# Patient Record
Sex: Female | Born: 1960 | Race: White | Hispanic: No | Marital: Married | Smoking: Never smoker
Health system: Southern US, Community
[De-identification: ages and names within clinical notes are randomized; demographics above are authoritative.]

## PROBLEM LIST (undated history)

## (undated) DIAGNOSIS — R112 Nausea with vomiting, unspecified: Secondary | ICD-10-CM

## (undated) DIAGNOSIS — I82409 Acute embolism and thrombosis of unspecified deep veins of unspecified lower extremity: Secondary | ICD-10-CM

## (undated) DIAGNOSIS — Z975 Presence of (intrauterine) contraceptive device: Secondary | ICD-10-CM

## (undated) DIAGNOSIS — I10 Essential (primary) hypertension: Secondary | ICD-10-CM

## (undated) DIAGNOSIS — N390 Urinary tract infection, site not specified: Secondary | ICD-10-CM

## (undated) DIAGNOSIS — F419 Anxiety disorder, unspecified: Secondary | ICD-10-CM

## (undated) DIAGNOSIS — Z923 Personal history of irradiation: Secondary | ICD-10-CM

## (undated) DIAGNOSIS — N393 Stress incontinence (female) (male): Secondary | ICD-10-CM

## (undated) DIAGNOSIS — Z9889 Other specified postprocedural states: Secondary | ICD-10-CM

## (undated) DIAGNOSIS — C189 Malignant neoplasm of colon, unspecified: Secondary | ICD-10-CM

## (undated) DIAGNOSIS — A419 Sepsis, unspecified organism: Secondary | ICD-10-CM

## (undated) DIAGNOSIS — T148XXA Other injury of unspecified body region, initial encounter: Secondary | ICD-10-CM

## (undated) DIAGNOSIS — B999 Unspecified infectious disease: Secondary | ICD-10-CM

## (undated) DIAGNOSIS — M1990 Unspecified osteoarthritis, unspecified site: Secondary | ICD-10-CM

## (undated) HISTORY — PX: BACK SURGERY: SHX140

## (undated) HISTORY — PX: OTHER SURGICAL HISTORY: SHX169

## (undated) HISTORY — PX: APPENDECTOMY: SHX54

## (undated) HISTORY — PX: REFRACTIVE SURGERY: SHX103

## (undated) HISTORY — DX: Unspecified osteoarthritis, unspecified site: M19.90

## (undated) HISTORY — PX: BREAST SURGERY: SHX581

---

## 1999-03-30 ENCOUNTER — Other Ambulatory Visit: Admission: RE | Admit: 1999-03-30 | Discharge: 1999-03-30 | Payer: Self-pay | Admitting: Obstetrics & Gynecology

## 2001-05-02 ENCOUNTER — Other Ambulatory Visit: Admission: RE | Admit: 2001-05-02 | Discharge: 2001-05-02 | Payer: Self-pay | Admitting: Obstetrics & Gynecology

## 2002-12-31 ENCOUNTER — Other Ambulatory Visit: Admission: RE | Admit: 2002-12-31 | Discharge: 2002-12-31 | Payer: Self-pay | Admitting: Obstetrics & Gynecology

## 2004-06-20 HISTORY — PX: INTRAUTERINE DEVICE INSERTION: SHX323

## 2004-07-08 ENCOUNTER — Other Ambulatory Visit: Admission: RE | Admit: 2004-07-08 | Discharge: 2004-07-08 | Payer: Self-pay | Admitting: Obstetrics & Gynecology

## 2005-08-01 ENCOUNTER — Other Ambulatory Visit: Admission: RE | Admit: 2005-08-01 | Discharge: 2005-08-01 | Payer: Self-pay | Admitting: Obstetrics and Gynecology

## 2008-06-11 ENCOUNTER — Observation Stay (HOSPITAL_COMMUNITY): Admission: RE | Admit: 2008-06-11 | Discharge: 2008-06-13 | Payer: Self-pay | Admitting: Orthopedic Surgery

## 2008-10-29 ENCOUNTER — Ambulatory Visit (HOSPITAL_BASED_OUTPATIENT_CLINIC_OR_DEPARTMENT_OTHER): Admission: RE | Admit: 2008-10-29 | Discharge: 2008-10-29 | Payer: Self-pay | Admitting: Orthopedic Surgery

## 2010-11-02 NOTE — Op Note (Signed)
[name], [name]             ACCOUNT NO.:  [id number]   MEDICAL RECORD NO.:  [id number]          PATIENT TYPE:  AMB   LOCATION:  DAY                          FACILITY:  [facility]   PHYSICIAN:  Georges Lynch. Gioffre, M.D.DATE OF BIRTH:  [birth year]   DATE OF PROCEDURE:  06/11/2008  DATE OF DISCHARGE:                               OPERATIVE REPORT   SURGEON:  Georges Lynch. Darrelyn Hillock, M.D.   ASSISTANT:  Dr. Marlowe Kays.   PREOPERATIVE DIAGNOSES:  1. 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  2. Severe lateral recess stenosis at L5-S1 on the left.  3. Herniated lumbar disk L4-5 on the left with a partial foot drop on      the left.  4. Herniated disk L5-S1 on the left.   POSTOPERATIVE DIAGNOSES:  1. 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  2. Severe lateral recess stenosis at L5-S1 on the left.  3. Herniated lumbar disk L4-5 on the left with a partial foot drop on      the left.  4. Herniated disk L5-S1 on the left.   OPERATION:  1. Complete decompressive lumbar laminectomy of the lateral recess at      L4-5 on the left.  2. Complete decompressive lumbar laminectomy at the L5-S1 foraminal      recess on the left with foraminotomies.  Also, I did a complete      laminectomy of L5.  3. Microdiskectomy at L4-5 left.  4. Microdiskectomy at L5-S1 on the left.   DESCRIPTION OF PROCEDURE:  Under general anesthesia with the patient on  the spinal frame, she first had 1 gram of IV Ancef.  A routine  orthopedic prep and drape of the back was carried out.  Two needles were  placed in the back for localization purposes.  Following that, an x-ray  was taken.  An incision then was made over the L4-5 and L5 interspace.  Bleeders were identified and cauterized.  I stripped the muscle from  both sides in order to apply the [facility] retractors.  I then went  down and separated the muscle from the lamina of L4-5 and L5-S1 on the  left.  Another x-ray was taken to verify the  position.  I noted at this  time that she had severe overgrowth of her for facets at L4-5 and L5-S1  and she had a very wide lamina on that side.  Note, she had a  significant recessed stenosis at L4-5 and L5-S1.  She had complete  collapse just about of the L5-S1 disk space secondary to degenerative  arthritis.  The microscope was brought in and we carried out a complete  laminectomy of the lamina of L5.  We then went distally and removed the  ligamentum flavum in the usual fashion and did a foraminotomy and  identified the S1 root, freed that up, identified the disk, and did a  microdiskectomy.  She had a rather small herniated disk at L5-S1 on the  left.  The nerve root now was free.  We then advanced proximally and up  along the lateral  recess, completely decompressed the lateral recess up  to the lamina above, did a partial laminectomy there as well.  At L4 he  had marked distention of the lateral recess veins.  We cauterized those  with the bipolar, with great care taken not to injure the underlying  neurological structures.  We identified the 5 root above, gently  retracted that, put a needle in the disk space and an x-ray was taken to  verify we were now at the L4-5 interspace.  We then made a cruciate  incision in that space and did a complete microdiskectomy.  Multiple  passes were made subligamentous as well and then we utilized the Epstein  curettes as well as the nerve hooks.  We decompressed the area out  laterally as well.  We decompressed the foramina as well.  The nerve  root of L5 now was completely free and the recess was free.  We  thoroughly irrigated out the area and then I injected 10 mL of FloSeal  and no cottonoids were used.  I then closed the deep part of the wound  in the usual fashion.  We left a small deep distal part of the wound  open for drainage purposes.  The subcu was closed with 0 Vicryl.  The  skin was closed with metal staples.  The patient left the  operating room  in satisfactory condition.           ______________________________  Georges Lynch Darrelyn Hillock, M.D.     RAG/MEDQ  D:  06/11/2008  T:  06/12/2008  Job:  [id number]   cc:   Eileen Stanford  [facility]

## 2010-11-02 NOTE — Op Note (Signed)
[name], [name]             ACCOUNT NO.:  [id number]   MEDICAL RECORD NO.:  [id number]          PATIENT TYPE:  AMB   LOCATION:  [facility]                         FACILITY:  [facility]   PHYSICIAN:  Ollen Gross, M.D.    DATE OF BIRTH:  23-Jan-1961   DATE OF PROCEDURE:  10/29/2008  DATE OF DISCHARGE:                               OPERATIVE REPORT   PREOPERATIVE DIAGNOSIS:  Left knee medial meniscal tear with lateral  patellar tilt.   POSTOPERATIVE DIAGNOSIS:  Left knee medial meniscal tear with lateral  patellar tilt plus chondral defect.   PROCEDURE:  Left knee arthroscopy with meniscal debridement,  chondroplasty medial and lateral retinacular release.   SURGEON:  Ollen Gross, M.D.   ASSISTANT:  None.   ANESTHESIA:  General.   ESTIMATED BLOOD LOSS:  Minimal.   DRAINS:  Hemovac x1.   COMPLICATIONS:  None.   CONDITION:  Stable to the recovery room.   BRIEF CLINICAL NOTE:  [name] is a 50 year old female who has had  significant left knee pain and mechanical symptoms for several months  now.  She has a lateral tilt to the patella and symptomatic  chondromalacia of the patella.  She has had extensive nonoperative  management without benefit.  She also has a medial meniscal tear on MRI.  She presents now for arthroscopy and debridement and lateral release.   PROCEDURE IN DETAIL:  After successful administration of general  anesthetic, a tourniquet is placed high on the left thigh, and the left  lower extremity is prepped and draped in the usual sterile fashion.  The  standard superior medial and inferior lateral incisions were made.  An  inflow cannula was passed superior medial, and a camera passed  inferolateral.  Arthroscopic visualization proceeds.  The undersurface  of the patella looks normal except for the lateral facet, which has  grade 2 chondromalacia and frayed cartilage.  There are no full-  thickness defects.  The trochlea looks normal.  Medial and lateral  gutters are visualized.  There are no loose bodies.  The patella is  tilting quite a bit laterally.  The medial compartment is entered, and  there is evidence of a chondral defect on the medial femoral condyle  near the intercondylar notch.  There is about a 1 x 2 cm area, and the  cartilage is delaminating from below.  The spinal needle was used to  localize the inferior medial portal.  A small incision was made, and  dilators placed.  A 4.2 shaver is then placed and used to probe the area  first and showed unstable articular cartilage.  It was then debrided  back to a stable base with the shaver.  The total size is about 1 x 2  cm.  There was very thin cartilage, but it was stabilized, and there is  no exposed bone.  I then debrided the meniscus back to a stable base  with baskets and a 4.2 mm shaver.  The meniscus was much more stable  post debridement.  The intercondylar notch is visualized.  The ACL is  looking fine.  The lateral compartment  is entered and looks normal with  the exception of a vertical fissure in the cartilage of the tibial side  laterally.  This is probed and is not unstable.  We then addressed the  patellofemoral joint, and I used a shaver to debride the unstable  cartilage on the undersurface of the patella back to a stable  cartilaginous base.  We then switched portals, placing the camera  inferomedial and working portal inferolateral.  We marked the junction  of the superior and lateral border of the patella and then began the  lateral release with arthroscopic electrocautery, starting above this  point and coursing all the way down to the lateral portal.  I then  decreased the inflow pressure and coagulated any points of bleeding.  The arthroscopic equipment is then removed from the inferior portals,  which are closed with interrupted 4-0 nylon.  Marcaine 20 cc of 0.25%  with epi injected through the inflow cannula.  We let that stay for a  couple of minutes and  then placed the Hemovac drain through the inflow  cannula, subsequently removing the cannula. The incision is closed with  the drains not sewn in.  It is hooked up to the Hemovac and charged.  A  bulky sterile dressing is then applied, and she is awakened and  transported to the recovery room in stable condition.      Ollen Gross, M.D.  Electronically Signed     FA/MEDQ  D:  10/29/2008  T:  10/29/2008  Job:  [id number]

## 2011-03-25 LAB — DIFFERENTIAL
Basophils Relative: 1 % (ref 0–1)
Lymphocytes Relative: 26 % (ref 12–46)
Lymphs Abs: 1.1 10*3/uL (ref 0.7–4.0)
Monocytes Relative: 8 % (ref 3–12)
Neutro Abs: 2.7 10*3/uL (ref 1.7–7.7)
Neutrophils Relative %: 64 % (ref 43–77)

## 2011-03-25 LAB — COMPREHENSIVE METABOLIC PANEL
Albumin: 3.9 g/dL (ref 3.5–5.2)
BUN: 9 mg/dL (ref 6–23)
Calcium: 9.2 mg/dL (ref 8.4–10.5)
Creatinine, Ser: 0.77 mg/dL (ref 0.4–1.2)
Glucose, Bld: 93 mg/dL (ref 70–99)
Total Protein: 6.5 g/dL (ref 6.0–8.3)

## 2011-03-25 LAB — CBC
HCT: 38.2 % (ref 36.0–46.0)
HCT: 40.8 % (ref 36.0–46.0)
Hemoglobin: 12.6 g/dL (ref 12.0–15.0)
Hemoglobin: 13 g/dL (ref 12.0–15.0)
Hemoglobin: 14.1 g/dL (ref 12.0–15.0)
MCHC: 34 g/dL (ref 30.0–36.0)
MCHC: 34 g/dL (ref 30.0–36.0)
MCHC: 34.5 g/dL (ref 30.0–36.0)
Platelets: 211 10*3/uL (ref 150–400)
Platelets: 219 10*3/uL (ref 150–400)
Platelets: 221 10*3/uL (ref 150–400)
RDW: 11.7 % (ref 11.5–15.5)
RDW: 11.8 % (ref 11.5–15.5)
RDW: 12.2 % (ref 11.5–15.5)

## 2011-03-25 LAB — PROTIME-INR: INR: 1 (ref 0.00–1.49)

## 2011-03-25 LAB — URINE CULTURE: Special Requests: NEGATIVE

## 2011-03-25 LAB — URINALYSIS, ROUTINE W REFLEX MICROSCOPIC
Glucose, UA: NEGATIVE mg/dL
Hgb urine dipstick: NEGATIVE
Ketones, ur: NEGATIVE mg/dL
Protein, ur: NEGATIVE mg/dL
Specific Gravity, Urine: 1.006 (ref 1.005–1.030)
Urobilinogen, UA: 0.2 mg/dL (ref 0.0–1.0)
pH: 6 (ref 5.0–8.0)

## 2011-03-25 LAB — PREGNANCY, URINE: Preg Test, Ur: NEGATIVE

## 2011-03-25 LAB — APTT: aPTT: 29 seconds (ref 24–37)

## 2011-12-29 ENCOUNTER — Other Ambulatory Visit: Payer: Self-pay | Admitting: Neurosurgery

## 2012-01-02 ENCOUNTER — Encounter (HOSPITAL_COMMUNITY): Payer: Self-pay | Admitting: Pharmacy Technician

## 2012-01-02 ENCOUNTER — Encounter (HOSPITAL_COMMUNITY)
Admission: RE | Admit: 2012-01-02 | Discharge: 2012-01-02 | Disposition: A | Discharge status: Home / Self Care | Payer: 59 | Source: Ambulatory Visit | Attending: Neurosurgery | Admitting: Neurosurgery

## 2012-01-02 ENCOUNTER — Encounter (HOSPITAL_COMMUNITY): Payer: Self-pay

## 2012-01-02 LAB — SURGICAL PCR SCREEN
MRSA, PCR: NEGATIVE
Staphylococcus aureus: NEGATIVE

## 2012-01-02 LAB — CBC
HCT: 39.7 % (ref 36.0–46.0)
Hemoglobin: 13.6 g/dL (ref 12.0–15.0)
MCH: 31.1 pg (ref 26.0–34.0)
MCHC: 34.3 g/dL (ref 30.0–36.0)
MCV: 90.8 fL (ref 78.0–100.0)
Platelets: 218 10*3/uL (ref 150–400)
RBC: 4.37 MIL/uL (ref 3.87–5.11)
RDW: 11.7 % (ref 11.5–15.5)
WBC: 5.3 10*3/uL (ref 4.0–10.5)

## 2012-01-02 LAB — TYPE AND SCREEN
ABO/RH(D): O POS
Antibody Screen: NEGATIVE

## 2012-01-02 LAB — DIFFERENTIAL
Basophils Absolute: 0 10*3/uL (ref 0.0–0.1)
Basophils Relative: 1 % (ref 0–1)
Eosinophils Absolute: 0.1 10*3/uL (ref 0.0–0.7)
Eosinophils Relative: 1 % (ref 0–5)
Lymphocytes Relative: 22 % (ref 12–46)
Lymphs Abs: 1.2 10*3/uL (ref 0.7–4.0)
Monocytes Absolute: 0.4 10*3/uL (ref 0.1–1.0)
Monocytes Relative: 8 % (ref 3–12)
Neutro Abs: 3.7 10*3/uL (ref 1.7–7.7)
Neutrophils Relative %: 69 % (ref 43–77)

## 2012-01-02 MED ORDER — DEXAMETHASONE SODIUM PHOSPHATE 10 MG/ML IJ SOLN
10.0000 mg | INTRAMUSCULAR | Status: DC
  Filled 2012-01-02: qty 1

## 2012-01-02 MED ORDER — CEFAZOLIN SODIUM-DEXTROSE 2-3 GM-% IV SOLR
2.0000 g | INTRAVENOUS | Status: AC
  Administered 2012-01-03: 2 g via INTRAVENOUS
  Filled 2012-01-02: qty 50

## 2012-01-02 NOTE — Pre-Procedure Instructions (Signed)
20 [name]  01/02/2012   Your procedure is scheduled on:  01/03/12  Report to [facility] at 1047 AM.  Call this number if you have problems the morning of surgery: [phone]   Remember:   Do not eat food:After Midnight.  May have clear liquids:until Midnight .  Clear liquids include soda, tea, black coffee, apple or grape juice, broth.  Take these medicines the morning of surgery with A SIP OF WATER: vivella dot   Do not wear jewelry, make-up or nail polish.  Do not wear lotions, powders, or perfumes. You may wear deodorant.  Do not shave 48 hours prior to surgery. Men may shave face and neck.  Do not bring valuables to the hospital.  Contacts, dentures or bridgework may not be worn into surgery.  Leave suitcase in the car. After surgery it may be brought to your room.  For patients admitted to the hospital, checkout time is 11:00 AM the day of discharge.   Patients discharged the day of surgery will not be allowed to drive home.  Name and phone number of your driver: family  Special Instructions: CHG Shower Use Special Wash: 1/2 bottle night before surgery and 1/2 bottle morning of surgery.   Please read over the following fact sheets that you were given: Pain Booklet, Coughing and Deep Breathing, Blood Transfusion Information, MRSA Information and Surgical Site Infection Prevention

## 2012-01-03 ENCOUNTER — Encounter (HOSPITAL_COMMUNITY): Payer: Self-pay | Admitting: Anesthesiology

## 2012-01-03 ENCOUNTER — Inpatient Hospital Stay (HOSPITAL_COMMUNITY)
Admission: RE | Admit: 2012-01-03 | Discharge: 2012-01-03 | DRG: 473 | Disposition: A | Discharge status: Home / Self Care | Payer: 59 | Source: Ambulatory Visit | Attending: Neurosurgery | Admitting: Neurosurgery

## 2012-01-03 ENCOUNTER — Ambulatory Visit (HOSPITAL_COMMUNITY): Payer: 59 | Admitting: Anesthesiology

## 2012-01-03 ENCOUNTER — Encounter (HOSPITAL_COMMUNITY)
Admission: RE | Disposition: A | Discharge status: Home / Self Care | Payer: Self-pay | Source: Ambulatory Visit | Attending: Neurosurgery

## 2012-01-03 ENCOUNTER — Inpatient Hospital Stay (HOSPITAL_COMMUNITY): Payer: 59

## 2012-01-03 ENCOUNTER — Encounter (HOSPITAL_COMMUNITY): Payer: Self-pay | Admitting: *Deleted

## 2012-01-03 DIAGNOSIS — Z01812 Encounter for preprocedural laboratory examination: Secondary | ICD-10-CM

## 2012-01-03 DIAGNOSIS — M5020 Other cervical disc displacement, unspecified cervical region: Principal | ICD-10-CM | POA: Diagnosis present

## 2012-01-03 DIAGNOSIS — M5010 Cervical disc disorder with radiculopathy, unspecified cervical region: Secondary | ICD-10-CM | POA: Diagnosis present

## 2012-01-03 DIAGNOSIS — Z96659 Presence of unspecified artificial knee joint: Secondary | ICD-10-CM

## 2012-01-03 HISTORY — PX: CERVICAL DISC ARTHROPLASTY: SHX587

## 2012-01-03 SURGERY — CERVICAL ANTERIOR DISC ARTHROPLASTY
Anesthesia: General | Site: Neck | Wound class: Clean

## 2012-01-03 MED ORDER — CYCLOBENZAPRINE HCL 10 MG PO TABS: 10.0000 mg | ORAL_TABLET | Freq: Three times a day (TID) | ORAL | Status: AC | PRN

## 2012-01-03 MED ORDER — CYCLOBENZAPRINE HCL 10 MG PO TABS
10.0000 mg | ORAL_TABLET | Freq: Three times a day (TID) | ORAL | Status: DC | PRN
  Administered 2012-01-03: 10 mg via ORAL

## 2012-01-03 MED ORDER — GLYCOPYRROLATE 0.2 MG/ML IJ SOLN
INTRAMUSCULAR | Status: DC | PRN
  Administered 2012-01-03: .6 mg via INTRAVENOUS

## 2012-01-03 MED ORDER — MIDAZOLAM HCL 5 MG/5ML IJ SOLN
INTRAMUSCULAR | Status: DC | PRN
  Administered 2012-01-03: 2 mg via INTRAVENOUS

## 2012-01-03 MED ORDER — SODIUM CHLORIDE 0.9 % IV SOLN: 250.0000 mL | INTRAVENOUS | Status: DC

## 2012-01-03 MED ORDER — PROPOFOL 10 MG/ML IV EMUL
INTRAVENOUS | Status: DC | PRN
  Administered 2012-01-03: 120 mg via INTRAVENOUS

## 2012-01-03 MED ORDER — HYDROMORPHONE HCL PF 1 MG/ML IJ SOLN
INTRAMUSCULAR | Status: AC
  Filled 2012-01-03: qty 1

## 2012-01-03 MED ORDER — SODIUM CHLORIDE 0.9 % IR SOLN
Status: DC | PRN
  Administered 2012-01-03: 13:00:00

## 2012-01-03 MED ORDER — ALUM & MAG HYDROXIDE-SIMETH 200-200-20 MG/5ML PO SUSP: 30.0000 mL | Freq: Four times a day (QID) | ORAL | Status: DC | PRN

## 2012-01-03 MED ORDER — HYDROMORPHONE HCL PF 1 MG/ML IJ SOLN
0.2500 mg | INTRAMUSCULAR | Status: DC | PRN
  Administered 2012-01-03 (×4): 0.5 mg via INTRAVENOUS

## 2012-01-03 MED ORDER — ACETAMINOPHEN 10 MG/ML IV SOLN: 1000.0000 mg | Freq: Once | INTRAVENOUS | Status: DC | PRN

## 2012-01-03 MED ORDER — LIDOCAINE HCL (CARDIAC) 20 MG/ML IV SOLN
INTRAVENOUS | Status: DC | PRN
  Administered 2012-01-03: 50 mg via INTRAVENOUS

## 2012-01-03 MED ORDER — THROMBIN 5000 UNITS EX SOLR
CUTANEOUS | Status: DC | PRN
  Administered 2012-01-03 (×2): 5000 [IU] via TOPICAL

## 2012-01-03 MED ORDER — HYDROMORPHONE HCL PF 1 MG/ML IJ SOLN: 0.5000 mg | INTRAMUSCULAR | Status: DC | PRN

## 2012-01-03 MED ORDER — LACTATED RINGERS IV SOLN
INTRAVENOUS | Status: DC | PRN
  Administered 2012-01-03 (×2): via INTRAVENOUS

## 2012-01-03 MED ORDER — 0.9 % SODIUM CHLORIDE (POUR BTL) OPTIME
TOPICAL | Status: DC | PRN
  Administered 2012-01-03: 1000 mL

## 2012-01-03 MED ORDER — SODIUM CHLORIDE 0.9 % IJ SOLN: 3.0000 mL | Freq: Two times a day (BID) | INTRAMUSCULAR | Status: DC

## 2012-01-03 MED ORDER — SODIUM CHLORIDE 0.9 % IV SOLN
INTRAVENOUS | Status: AC
  Filled 2012-01-03: qty 500

## 2012-01-03 MED ORDER — ACETAMINOPHEN 650 MG RE SUPP: 650.0000 mg | RECTAL | Status: DC | PRN

## 2012-01-03 MED ORDER — NEOSTIGMINE METHYLSULFATE 1 MG/ML IJ SOLN
INTRAMUSCULAR | Status: DC | PRN
  Administered 2012-01-03: 4 mg via INTRAVENOUS

## 2012-01-03 MED ORDER — HYDROCODONE-ACETAMINOPHEN 5-325 MG PO TABS: 1.0000 | ORAL_TABLET | ORAL | Status: DC | PRN

## 2012-01-03 MED ORDER — PHENOL 1.4 % MT LIQD: 1.0000 | OROMUCOSAL | Status: DC | PRN

## 2012-01-03 MED ORDER — HEMOSTATIC AGENTS (NO CHARGE) OPTIME
TOPICAL | Status: DC | PRN
  Administered 2012-01-03: 1 via TOPICAL

## 2012-01-03 MED ORDER — SODIUM CHLORIDE 0.9 % IJ SOLN: 3.0000 mL | INTRAMUSCULAR | Status: DC | PRN

## 2012-01-03 MED ORDER — CEFAZOLIN SODIUM 1-5 GM-% IV SOLN
1.0000 g | Freq: Three times a day (TID) | INTRAVENOUS | Status: DC
  Administered 2012-01-03: 1 g via INTRAVENOUS
  Filled 2012-01-03 (×2): qty 50

## 2012-01-03 MED ORDER — ZOLPIDEM TARTRATE 5 MG PO TABS: 5.0000 mg | ORAL_TABLET | Freq: Every evening | ORAL | Status: DC | PRN

## 2012-01-03 MED ORDER — DEXAMETHASONE SODIUM PHOSPHATE 10 MG/ML IJ SOLN
INTRAMUSCULAR | Status: DC | PRN
  Administered 2012-01-03: 10 mg via INTRAVENOUS

## 2012-01-03 MED ORDER — ONDANSETRON HCL 4 MG/2ML IJ SOLN: 4.0000 mg | INTRAMUSCULAR | Status: DC | PRN

## 2012-01-03 MED ORDER — ACETAMINOPHEN 325 MG PO TABS
650.0000 mg | ORAL_TABLET | ORAL | Status: DC | PRN
Start: 2012-01-03 — End: 2012-01-04

## 2012-01-03 MED ORDER — OXYCODONE-ACETAMINOPHEN 5-325 MG PO TABS
1.0000 | ORAL_TABLET | ORAL | Status: DC | PRN
  Administered 2012-01-03: 1 via ORAL
  Filled 2012-01-03: qty 1

## 2012-01-03 MED ORDER — HYDROCODONE-ACETAMINOPHEN 10-325 MG PO TABS: 1.0000 | ORAL_TABLET | ORAL | Status: AC | PRN

## 2012-01-03 MED ORDER — ONDANSETRON HCL 4 MG/2ML IJ SOLN
INTRAMUSCULAR | Status: DC | PRN
  Administered 2012-01-03: 4 mg via INTRAVENOUS

## 2012-01-03 MED ORDER — FENTANYL CITRATE 0.05 MG/ML IJ SOLN
INTRAMUSCULAR | Status: DC | PRN
  Administered 2012-01-03: 50 ug via INTRAVENOUS
  Administered 2012-01-03 (×2): 100 ug via INTRAVENOUS

## 2012-01-03 MED ORDER — ROCURONIUM BROMIDE 100 MG/10ML IV SOLN
INTRAVENOUS | Status: DC | PRN
  Administered 2012-01-03: 40 mg via INTRAVENOUS

## 2012-01-03 MED ORDER — BACITRACIN 50000 UNITS IM SOLR
INTRAMUSCULAR | Status: AC
  Filled 2012-01-03: qty 1

## 2012-01-03 MED ORDER — SENNA 8.6 MG PO TABS: 1.0000 | ORAL_TABLET | Freq: Two times a day (BID) | ORAL | Status: DC

## 2012-01-03 MED ORDER — ONDANSETRON HCL 4 MG/2ML IJ SOLN: 4.0000 mg | Freq: Once | INTRAMUSCULAR | Status: DC | PRN

## 2012-01-03 MED ORDER — MENTHOL 3 MG MT LOZG: 1.0000 | LOZENGE | OROMUCOSAL | Status: DC | PRN

## 2012-01-03 MED ORDER — CYCLOBENZAPRINE HCL 10 MG PO TABS
ORAL_TABLET | ORAL | Status: AC
  Filled 2012-01-03: qty 1

## 2012-01-03 MED ORDER — LIDOCAINE HCL 4 % MT SOLN
OROMUCOSAL | Status: DC | PRN
  Administered 2012-01-03: 4 mL via TOPICAL

## 2012-01-03 SURGICAL SUPPLY — 56 items
ADH SKN CLS APL DERMABOND .7 (GAUZE/BANDAGES/DRESSINGS) ×1
APL SKNCLS STERI-STRIP NONHPOA (GAUZE/BANDAGES/DRESSINGS) ×1
BAG DECANTER FOR FLEXI CONT (MISCELLANEOUS) ×2 IMPLANT
BENZOIN TINCTURE PRP APPL 2/3 (GAUZE/BANDAGES/DRESSINGS) ×2 IMPLANT
BRUSH SCRUB EZ PLAIN DRY (MISCELLANEOUS) ×2 IMPLANT
BUR MATCHSTICK NEURO 3.0 LAGG (BURR) ×2 IMPLANT
CANISTER SUCTION 2500CC (MISCELLANEOUS) ×2 IMPLANT
CLOTH BEACON ORANGE TIMEOUT ST (SAFETY) ×2 IMPLANT
CONT SPEC 4OZ CLIKSEAL STRL BL (MISCELLANEOUS) ×2 IMPLANT
CUTTER 15 MM ×1 IMPLANT
DERMABOND ADVANCED (GAUZE/BANDAGES/DRESSINGS) ×1
DERMABOND ADVANCED .7 DNX12 (GAUZE/BANDAGES/DRESSINGS) IMPLANT
DISC CERVICAL BRYAN 15MM (Neuro Prosthesis/Implant) ×1 IMPLANT
DRAPE C-ARM 42X72 X-RAY (DRAPES) ×4 IMPLANT
DRAPE LAPAROTOMY 100X72 PEDS (DRAPES) ×2 IMPLANT
DRAPE MICROSCOPE ZEISS OPMI (DRAPES) ×2 IMPLANT
DRAPE POUCH INSTRU U-SHP 10X18 (DRAPES) ×2 IMPLANT
ELECT COATED BLADE 2.86 ST (ELECTRODE) ×2 IMPLANT
ELECT REM PT RETURN 9FT ADLT (ELECTROSURGICAL) ×2
ELECTRODE REM PT RTRN 9FT ADLT (ELECTROSURGICAL) ×1 IMPLANT
GAUZE SPONGE 4X4 16PLY XRAY LF (GAUZE/BANDAGES/DRESSINGS) IMPLANT
GLOVE BIOGEL M 8.0 STRL (GLOVE) ×1 IMPLANT
GLOVE BIOGEL PI IND STRL 7.0 (GLOVE) IMPLANT
GLOVE BIOGEL PI INDICATOR 7.0 (GLOVE) ×3
GLOVE ECLIPSE 8.5 STRL (GLOVE) ×2 IMPLANT
GLOVE EXAM NITRILE LRG STRL (GLOVE) IMPLANT
GLOVE EXAM NITRILE MD LF STRL (GLOVE) ×1 IMPLANT
GLOVE EXAM NITRILE XL STR (GLOVE) IMPLANT
GLOVE EXAM NITRILE XS STR PU (GLOVE) IMPLANT
GLOVE OPTIFIT SS 6.5 STRL BRWN (GLOVE) ×2 IMPLANT
GLOVE SS BIOGEL STRL SZ 6.5 (GLOVE) IMPLANT
GLOVE SUPERSENSE BIOGEL SZ 6.5 (GLOVE) ×2
GOWN BRE IMP SLV AUR LG STRL (GOWN DISPOSABLE) ×1 IMPLANT
GOWN BRE IMP SLV AUR XL STRL (GOWN DISPOSABLE) ×2 IMPLANT
GOWN STRL REIN 2XL LVL4 (GOWN DISPOSABLE) IMPLANT
HEAD HALTER (SOFTGOODS) ×2 IMPLANT
KIT BASIN OR (CUSTOM PROCEDURE TRAY) ×2 IMPLANT
KIT ROOM TURNOVER OR (KITS) ×2 IMPLANT
NDL SPNL 20GX3.5 QUINCKE YW (NEEDLE) ×1 IMPLANT
NEEDLE SPNL 20GX3.5 QUINCKE YW (NEEDLE) ×2 IMPLANT
NS IRRIG 1000ML POUR BTL (IV SOLUTION) ×2 IMPLANT
PACK LAMINECTOMY NEURO (CUSTOM PROCEDURE TRAY) ×2 IMPLANT
PAD ARMBOARD 7.5X6 YLW CONV (MISCELLANEOUS) ×5 IMPLANT
RUBBERBAND STERILE (MISCELLANEOUS) ×4 IMPLANT
SPONGE GAUZE 4X4 12PLY (GAUZE/BANDAGES/DRESSINGS) ×2 IMPLANT
SPONGE INTESTINAL PEANUT (DISPOSABLE) ×2 IMPLANT
SPONGE SURGIFOAM ABS GEL SZ50 (HEMOSTASIS) ×2 IMPLANT
STRIP CLOSURE SKIN 1/2X4 (GAUZE/BANDAGES/DRESSINGS) ×2 IMPLANT
SUT PDS AB 5-0 P3 18 (SUTURE) ×2 IMPLANT
SUT VIC AB 3-0 SH 8-18 (SUTURE) ×2 IMPLANT
SYR 20ML ECCENTRIC (SYRINGE) ×2 IMPLANT
TAPE CLOTH 4X10 WHT NS (GAUZE/BANDAGES/DRESSINGS) ×1 IMPLANT
TAPE CLOTH SURG 4X10 WHT LF (GAUZE/BANDAGES/DRESSINGS) ×1 IMPLANT
TOWEL OR 17X24 6PK STRL BLUE (TOWEL DISPOSABLE) ×2 IMPLANT
TOWEL OR 17X26 10 PK STRL BLUE (TOWEL DISPOSABLE) ×2 IMPLANT
WATER STERILE IRR 1000ML POUR (IV SOLUTION) ×2 IMPLANT

## 2012-01-03 NOTE — Preoperative (Signed)
Beta Blockers   Reason not to administer Beta Blockers:Not Applicable 

## 2012-01-03 NOTE — Discharge Summary (Signed)
Physician Discharge Summary  Patient ID: [name] MRN: [id number] DOB/AGE: 49-Mar-1962 49 y.o.  Admit date: 01/03/2012 Discharge date: 01/03/2012  Admission Diagnoses:  Discharge Diagnoses:  Principal Problem:  *Herniation of cervical intervertebral disc with radiculopathy   Discharged Condition: good  Hospital Course: Patient admitted to the hospital where she underwent uncomplicated C5-6 anterior cervical discectomy and arthroplasty. Postoperative she is done quite well. Preoperative neck and upper extremity symptoms improved. Strength cessation intact. Swallowing and doing well. Ready for discharge home. Consults:   Significant Diagnostic Studies:   Treatments:   Discharge Exam: Blood pressure 130/69, pulse 71, temperature 97.3 F (36.3 C), temperature source Oral, resp. rate 19, SpO2 98.00%. Awake and alert oriented and appropriate. Cranial nerve function is intact. Motor sensory function extremities is normal. Wound clean dry and intact. Chest and abdomen benign.  Disposition:    Medication List  As of 01/03/2012  6:11 PM   TAKE these medications         cyclobenzaprine 10 MG tablet   Commonly known as: FLEXERIL   Take 1 tablet (10 mg total) by mouth 3 (three) times daily as needed for muscle spasms.      estradiol 0.05 MG/24HR   Commonly known as: VIVELLE-DOT   Place 1 patch onto the skin 2 (two) times a week. Sundays and Wednesday      HYDROcodone-acetaminophen 10-325 MG per tablet   Commonly known as: NORCO   Take 1-2 tablets by mouth every 4 (four) hours as needed for pain.           Follow-up Information    Follow up with Luz Mares A, MD. Call in 1 week. (Ask for Lurena Joiner)    Contact information:   [address], [address] [place] [address] [phone]          Signed: Temple Pacini 01/03/2012, 6:11 PM

## 2012-01-03 NOTE — H&P (Signed)
[name] is an 51 y.o. female.   Chief Complaint: Neck and right arm pain HPI: 51 year old female with neck and right upper trimming pain procedures and weakness consistent with a right-sided C6-3 copy failing conservative measure workup demonstrates evidence of a large right-sided C5-6 disc herniation with compression the spinal cord and exiting right-sided C6 nerve root. Patient been counseled as to her options. She is decided proceed with a C5-6 anterior cervical discectomy with C5-6 interbody arthroplasty. Past Medical History  Diagnosis Date  . No pertinent past medical history     Past Surgical History  Procedure Date  . Back surgery   . Cesarean section     x2  . Dand c   . Breat reduction   . Joint replacement     lt knee    History reviewed. No pertinent family history. Social History:  reports that she has never smoked. She does not have any smokeless tobacco history on file. She reports that she drinks alcohol. She reports that she does not use illicit drugs.  Allergies: No Known Allergies  Medications Prior to Admission  Medication Sig Dispense Refill  . estradiol (VIVELLE-DOT) 0.05 MG/24HR Place 1 patch onto the skin 2 (two) times a week. Sundays and Wednesday        Results for orders placed during the hospital encounter of 01/02/12 (from the past 48 hour(s))  SURGICAL PCR SCREEN     Status: Normal   Collection Time   01/02/12 12:24 PM      Component Value Range Comment   MRSA, PCR NEGATIVE  NEGATIVE    Staphylococcus aureus NEGATIVE  NEGATIVE   CBC     Status: Normal   Collection Time   01/02/12  1:21 PM      Component Value Range Comment   WBC 5.3  4.0 - 10.5 K/uL    RBC 4.37  3.87 - 5.11 MIL/uL    Hemoglobin 13.6  12.0 - 15.0 g/dL    HCT 16.1  09.6 - 04.5 %    MCV 90.8  78.0 - 100.0 fL    MCH 31.1  26.0 - 34.0 pg    MCHC 34.3  30.0 - 36.0 g/dL    RDW 40.9  81.1 - 91.4 %    Platelets 218  150 - 400 K/uL   DIFFERENTIAL     Status: Normal   Collection Time   01/02/12  1:21 PM      Component Value Range Comment   Neutrophils Relative 69  43 - 77 %    Neutro Abs 3.7  1.7 - 7.7 K/uL    Lymphocytes Relative 22  12 - 46 %    Lymphs Abs 1.2  0.7 - 4.0 K/uL    Monocytes Relative 8  3 - 12 %    Monocytes Absolute 0.4  0.1 - 1.0 K/uL    Eosinophils Relative 1  0 - 5 %    Eosinophils Absolute 0.1  0.0 - 0.7 K/uL    Basophils Relative 1  0 - 1 %    Basophils Absolute 0.0  0.0 - 0.1 K/uL   TYPE AND SCREEN     Status: Normal   Collection Time   01/02/12  1:25 PM      Component Value Range Comment   ABO/RH(D) O POS      Antibody Screen NEG      Sample Expiration 01/16/2012     ABO/RH     Status: Normal   Collection  Time   01/02/12  1:25 PM      Component Value Range Comment   ABO/RH(D) O POS      No results found.  Review of Systems  Constitutional: Negative.   HENT: Negative.   Eyes: Negative.   Respiratory: Negative.   Cardiovascular: Negative.   Gastrointestinal: Negative.   Genitourinary: Negative.   Musculoskeletal: Negative.   Skin: Negative.   Neurological: Negative.   Endo/Heme/Allergies: Negative.   Psychiatric/Behavioral: Negative.     Blood pressure 121/79, pulse 73, temperature 98 F (36.7 C), temperature source Oral, resp. rate 20, SpO2 95.00%. Physical Exam  Constitutional: She is oriented to person, place, and time. She appears well-developed and well-nourished.  HENT:  Head: Normocephalic and atraumatic.  Right Ear: External ear normal.  Left Ear: External ear normal.  Nose: Nose normal.  Mouth/Throat: Oropharynx is clear and moist.  Eyes: Conjunctivae and EOM are normal. Pupils are equal, round, and reactive to light. Right eye exhibits no discharge. Left eye exhibits no discharge.  Neck: Normal range of motion. Neck supple. No tracheal deviation present. No thyromegaly present.  Cardiovascular: Normal rate, regular rhythm, normal heart sounds and intact distal pulses.   No murmur  heard. Respiratory: Effort normal and breath sounds normal. No respiratory distress. She has no wheezes.  GI: Soft. Bowel sounds are normal. She exhibits no distension. There is no tenderness.  Musculoskeletal: Normal range of motion. She exhibits no edema and no tenderness.  Neurological: She is alert and oriented to person, place, and time. She has normal reflexes. She displays normal reflexes. No cranial nerve deficit. She exhibits normal muscle tone. Coordination normal.       Spurling's maneuver positive towards the right. Right wrist extensors graded out at 4/5. Decreased sensation right C6 dermatome. Remainder of her neurological exam intact.  Skin: Skin is warm and dry. No rash noted. No erythema. No pallor.  Psychiatric: She has a normal mood and affect. Her behavior is normal. Judgment and thought content normal.     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 Plan C5-6 anterior cervical discectomy with C5-6 anterior interbody arthroplasty. Risks and benefits been explained. Patient wishes to proceed.  Briley Bumgarner A 01/03/2012, 11:43 AM

## 2012-01-03 NOTE — Transfer of Care (Signed)
Immediate Anesthesia Transfer of Care Note  Patient: [name]  Procedure(s) Performed: Procedure(s) (LRB): CERVICAL ANTERIOR DISC ARTHROPLASTY (N/A)  Patient Location: PACU  Anesthesia Type: General  Level of Consciousness: awake, alert , oriented and patient cooperative  Airway & Oxygen Therapy: Patient Spontanous Breathing and Patient connected to nasal cannula oxygen  Post-op Assessment: Report given to PACU RN and Post -op Vital signs reviewed and stable  Post vital signs: Reviewed and stable  Complications: No apparent anesthesia complications

## 2012-01-03 NOTE — Brief Op Note (Signed)
01/03/2012  1:38 PM  PATIENT:  [name]  51 y.o. female  PRE-OPERATIVE DIAGNOSIS:  HNPA  POST-OPERATIVE DIAGNOSIS:  HNPA  PROCEDURE:  Procedure(s) (LRB): CERVICAL ANTERIOR DISC ARTHROPLASTY (N/A)  SURGEON:  Surgeon(s) and Role:    * Temple Pacini, MD - Primary  PHYSICIAN ASSISTANT:   ASSISTANTS:    ANESTHESIA:   general  EBL:  Total I/O In: 1000 [I.V.:1000] Out: 75 [Blood:75]  BLOOD ADMINISTERED:none  DRAINS: none   LOCAL MEDICATIONS USED:  NONE  SPECIMEN:  No Specimen  DISPOSITION OF SPECIMEN:  N/A  COUNTS:  YES  TOURNIQUET:  * No tourniquets in log *  DICTATION: .Dragon Dictation  PLAN OF CARE: Admit to inpatient   PATIENT DISPOSITION:  PACU - hemodynamically stable.   Delay start of Pharmacological VTE agent (>24hrs) due to surgical blood loss or risk of bleeding: yes

## 2012-01-03 NOTE — Op Note (Signed)
Date of procedure: 01/03/2012  Date of dictation: Same  Service: Neurosurgery  Preoperative diagnosis: Right C5-6 herniated pulposus with radiculopathy  Postoperative diagnosis: Same  Procedure Name: C5-6 anterior cervical discectomy with C5-6 anterior Bryan cervical interbody arthroplasty  Surgeon:Fotios Amos A.Durga Saldarriaga, M.D.  Asst. Surgeon: Jeral Fruit  Anesthesia: General  Indication: 51 year old female with neck and right upper any pain. Using weakest cyst with a right-sided C6 radiculopathy failing conservative measure workup demonstrates evidence of a large right-sided C5-6 disc herniation compression the thecal sac and right-sided C6 nerve root. Patient is now 4 C5-6 anterior cervical discectomy with interbody arthroplasty.  Operative note: After induction anesthesia, patient positioned supine with Extended and held in place with halter traction. Patient's anterior cervical region was prepped and draped sterilely. 10 blade used to make a linear skin incision overlying the C5-6 interspace. This is carried down sharply to the platysma. The platysma was then divided vertically and dissection proceeded along the medial border of the sternocleidomastoid muscle and carotid sheath. Trachea and esophagus were mobilized and retracted towards the left. Prevertebral fascia was stripped off the anterior spinal column. Longus: Longus: Muscles and elevated bilaterally using electric cautery. Deep self retaining retractor was placed intraoperative fluoroscopy is used and C5-6 level was confirmed. The space and incised 15 blade in a rectangular fashion. Y. disc space then achieved using pituitary rongeurs for tobacco progress Kerrison rongeurs high-speed drill per almost the disc were removed down to level posterior annulus. Charlies Silvers brought field these at the remainder of the discectomy remaining aspects of annulus and osteophytes removed using a high-speed drill down to the level posterior longitudinal  ligament. Posterior lateral and was elevated and resected piecemeal fashion using Kerrison rongeurs. Underlying thecal sac was identified. Y. center decompression and then performed by undercutting the bodies of C5 and C6. Decompression MCH are afraid are wide anterior foraminotomies were then performed on course exiting C6 nerve roots bilaterally including all elements of the disc herniation off to the right side. At this point a very thorough decompression had been achieved. There is no evidence of injury to thecal sac or nerve roots. Preparation was then made for interbody arthroplasty. Caspar pins were then placed into the C5 and C6 levels under fluoroscopic guidance. The space was distracted. The space was sized a 15 mm. The space was prepared prepared and cut to accept a 15 mm implant. The cutting drill was then inserted and troughs were drilled into the body of C5 and C6 for placement of the arthroplasty device. The endplate preparation was found to be adequate. Wound is irrigated out solution. A 15 mm Bryan disc implant was then impacted in place and fit snugly between the bodies of C5 and C6. Distractor pins were removed. Final images revealed good position of the implant the proper upper level with normal lamina spine. Wound is irrigated one final time and then closed in a typical fashion. Steri-Strips and sterile dressing were applied. There were no apparent complications. Patient tolerated the procedure well and she returns to the recovery room.

## 2012-01-03 NOTE — Anesthesia Postprocedure Evaluation (Signed)
  Anesthesia Post-op Note  Patient: [name]  Procedure(s) Performed: Procedure(s) (LRB): CERVICAL ANTERIOR DISC ARTHROPLASTY (N/A)  Patient Location: PACU  Anesthesia Type: General  Level of Consciousness: awake, alert  and oriented  Airway and Oxygen Therapy: Patient Spontanous Breathing  Post-op Pain: mild  Post-op Assessment: Post-op Vital signs reviewed and Patient's Cardiovascular Status Stable  Post-op Vital Signs: stable  Complications: No apparent anesthesia complications

## 2012-01-03 NOTE — Anesthesia Preprocedure Evaluation (Addendum)
Anesthesia Evaluation  Patient identified by MRN, date of birth, ID band Patient awake    Reviewed: Allergy & Precautions, H&P , NPO status , Patient's Chart, lab work & pertinent test results  History of Anesthesia Complications Negative for: history of anesthetic complications  Airway Mallampati: I TM Distance: >3 FB Neck ROM: Full    Dental  (+) Teeth Intact   Pulmonary neg pulmonary ROS,  breath sounds clear to auscultation        Cardiovascular negative cardio ROS  Rhythm:Regular Rate:Normal     Neuro/Psych  Neuromuscular disease negative psych ROS   GI/Hepatic negative GI ROS, Neg liver ROS,   Endo/Other  negative endocrine ROS  Renal/GU negative Renal ROS  negative genitourinary   Musculoskeletal negative musculoskeletal ROS (+)   Abdominal   Peds  Hematology negative hematology ROS (+)   Anesthesia Other Findings   Reproductive/Obstetrics                          Anesthesia Physical Anesthesia Plan  ASA: I  Anesthesia Plan: General   Post-op Pain Management:    Induction: Intravenous  Airway Management Planned: Oral ETT  Additional Equipment:   Intra-op Plan:   Post-operative Plan: Extubation in OR  Informed Consent: I have reviewed the patients History and Physical, chart, labs and discussed the procedure including the risks, benefits and alternatives for the proposed anesthesia with the patient or authorized representative who has indicated his/her understanding and acceptance.   Dental advisory given  Plan Discussed with:   Anesthesia Plan Comments: (HNP C5-6  Plan GA   Kipp Brood, MD)        Anesthesia Quick Evaluation

## 2012-01-03 NOTE — Progress Notes (Signed)
Pt discharge home with husband. Medication prescription and discharge instructions given using "teach-back" method. Opportunity given to ask questions with appropriate answers provided.incision without swelling, redness, drainage and/or redness. Drsg intact. Soft collar intact. Denied pain upon discharge. Escorted out of this unit by the nurse tech, in wheelchair, accompanied by husband.

## 2012-01-04 ENCOUNTER — Encounter (HOSPITAL_COMMUNITY): Payer: Self-pay | Admitting: Neurosurgery

## 2012-12-12 ENCOUNTER — Ambulatory Visit (INDEPENDENT_AMBULATORY_CARE_PROVIDER_SITE_OTHER): Payer: 59 | Admitting: Physician Assistant

## 2012-12-12 VITALS — BP 126/84 | HR 70 | Temp 98.0°F | Resp 16 | Ht 64.5 in | Wt 138.0 lb

## 2012-12-12 DIAGNOSIS — H9190 Unspecified hearing loss, unspecified ear: Secondary | ICD-10-CM

## 2012-12-12 DIAGNOSIS — T162XXA Foreign body in left ear, initial encounter: Secondary | ICD-10-CM

## 2012-12-12 DIAGNOSIS — H9193 Unspecified hearing loss, bilateral: Secondary | ICD-10-CM

## 2012-12-12 DIAGNOSIS — T169XXA Foreign body in ear, unspecified ear, initial encounter: Secondary | ICD-10-CM

## 2012-12-13 NOTE — Progress Notes (Signed)
   Patient ID: [name] MRN: [id number], DOB: 11/29/1960, 52 y.o. Date of Encounter: 12/13/2012, 7:36 AM  Primary Physician: Freddy Finner, MD  Chief Complaint: FB left IAC  HPI: 52 y.o. female with history below presents with FB in left IAC x 1 day. Patient states she was placing her hearing aid in and the rubber portion that goes into the canal came off. This has happened to her once previously. She has tried removed it with a lollipop stick, pickups, and flushing it out with water-all unsuccessfully today. It is causing her some discomfort. She did place a call into her audiologist's office, however they could not see her until 6/26. She is otherwise doing well.     Past Medical History  Diagnosis Date  . No pertinent past medical history      Home Meds: Prior to Admission medications   Medication Sig Start Date End Date Taking? Authorizing Provider  estradiol (VIVELLE-DOT) 0.05 MG/24HR Place 1 patch onto the skin 2 (two) times a week. Sundays and Wednesday   Yes Historical Provider, MD    Allergies: No Known Allergies  History   Social History  . Marital Status: Married    Spouse Name: N/A    Number of Children: N/A  . Years of Education: N/A   Occupational History  . Not on file.   Social History Main Topics  . Smoking status: Never Smoker   . Smokeless tobacco: Not on file  . Alcohol Use: Yes     Comment: wweekends  . Drug Use: No  . Sexually Active: Not on file   Other Topics Concern  . Not on file   Social History Narrative  . No narrative on file     Review of Systems: Constitutional: negative for chills, fever, or fatigue  HEENT: positive for hearing loss and otalgia. negative for vision changes, congestion, rhinorrhea, ST, or sinus pressure Respiratory: negative for wheezing, shortness of breath, or cough Dermatological: negative for rash Neurologic: negative for headache   Physical Exam: Blood pressure 126/84, pulse 70, temperature 98 F  (36.7 C), temperature source Oral, resp. rate 16, height 5' 4.5" (1.638 m), weight 138 lb (62.596 kg), SpO2 100.00%., Body mass index is 23.33 kg/(m^2). General: Well developed, well nourished, in no acute distress. Head: Normocephalic, atraumatic, eyes without discharge, sclera non-icteric, nares are without discharge. Right auditory canal clear, TM without perforation, pearly grey and translucent with reflective cone of light. Left auditory canal canal with black rubbery FB along auditory canal. See below.  Neck: Supple. Full ROM.  Lungs: Breathing is unlabored. Heart: Regular rate. Msk:  Strength and tone normal for age. Extremities/Skin: Warm and dry. No clubbing or cyanosis. No edema. No rashes or suspicious lesions. Neuro: Alert and oriented X 3. Moves all extremities spontaneously. Gait is normal. CNII-XII grossly in tact. Psych:  Responds to questions appropriately with a normal affect.     PROCEDURE NOTE: Verbal consent obtained.  Patient made an informed decision to proceed with the procedure. Tamara held otoscope while I used alligator forceps to remove FB in in 2 attempts.  FB removed intact.  TM inspected and found to be intact without perforation.  IAC without trauma.  Patient tolerated procedure well.    ASSESSMENT AND PLAN:  52 y.o. female with FB in left IAC status post removal per above -Removal per above -Follow up with hearing aid specialist  -RTC prn   Signed, Eula Listen, PA-C 12/13/2012 7:36 AM

## 2013-05-27 ENCOUNTER — Telehealth (INDEPENDENT_AMBULATORY_CARE_PROVIDER_SITE_OTHER): Payer: Self-pay

## 2013-05-27 ENCOUNTER — Other Ambulatory Visit: Payer: Self-pay | Admitting: Gastroenterology

## 2013-05-27 DIAGNOSIS — R140 Abdominal distension (gaseous): Secondary | ICD-10-CM

## 2013-05-27 NOTE — Telephone Encounter (Signed)
[name] with Dr Kinnie Scales will be ordering CT. To have results for  Appt 05-29-13 with Dr. Ezzard Standing

## 2013-05-27 NOTE — Telephone Encounter (Signed)
Patient husband called regarding appt for CT. States patient had colonoscopy and now having severe pain. Dr Kinnie Scales spoke to family over weekend and told them to call and ask for Glenda to set up a CT scan asap for patient. She is scheduled to see Dr Ezzard Standing on Wednesday. Advised I will send a message to [facility] regarding a CT order and she will call husband [name] back at [phone].

## 2013-05-28 ENCOUNTER — Ambulatory Visit
Admission: RE | Admit: 2013-05-28 | Discharge: 2013-05-28 | Disposition: A | Discharge status: Home / Self Care | Payer: 59 | Source: Ambulatory Visit | Attending: Gastroenterology | Admitting: Gastroenterology

## 2013-05-28 ENCOUNTER — Other Ambulatory Visit (INDEPENDENT_AMBULATORY_CARE_PROVIDER_SITE_OTHER): Payer: Self-pay

## 2013-05-28 DIAGNOSIS — R140 Abdominal distension (gaseous): Secondary | ICD-10-CM

## 2013-05-28 MED ORDER — IOHEXOL 300 MG/ML  SOLN
100.0000 mL | Freq: Once | INTRAMUSCULAR | Status: AC | PRN
  Administered 2013-05-28: 100 mL via INTRAVENOUS

## 2013-05-29 ENCOUNTER — Encounter (INDEPENDENT_AMBULATORY_CARE_PROVIDER_SITE_OTHER): Payer: Self-pay

## 2013-05-29 ENCOUNTER — Telehealth: Payer: Self-pay | Admitting: *Deleted

## 2013-05-29 ENCOUNTER — Encounter (INDEPENDENT_AMBULATORY_CARE_PROVIDER_SITE_OTHER): Payer: Self-pay | Admitting: Surgery

## 2013-05-29 ENCOUNTER — Ambulatory Visit (INDEPENDENT_AMBULATORY_CARE_PROVIDER_SITE_OTHER): Payer: 59 | Admitting: Surgery

## 2013-05-29 ENCOUNTER — Other Ambulatory Visit (INDEPENDENT_AMBULATORY_CARE_PROVIDER_SITE_OTHER): Payer: Self-pay

## 2013-05-29 VITALS — BP 120/78 | HR 68 | Temp 98.4°F | Resp 14 | Ht 64.0 in | Wt 150.8 lb

## 2013-05-29 DIAGNOSIS — C189 Malignant neoplasm of colon, unspecified: Secondary | ICD-10-CM

## 2013-05-29 DIAGNOSIS — C20 Malignant neoplasm of rectum: Secondary | ICD-10-CM

## 2013-05-29 LAB — COMPREHENSIVE METABOLIC PANEL
Alkaline Phosphatase: 53 U/L (ref 39–117)
BUN: 7 mg/dL (ref 6–23)
CO2: 29 mEq/L (ref 19–32)
Creat: 0.73 mg/dL (ref 0.50–1.10)
Glucose, Bld: 92 mg/dL (ref 70–99)
Sodium: 140 mEq/L (ref 135–145)
Total Bilirubin: 0.3 mg/dL (ref 0.3–1.2)
Total Protein: 6.9 g/dL (ref 6.0–8.3)

## 2013-05-29 NOTE — Patient Instructions (Signed)
I will talk to you after the consultations.

## 2013-05-29 NOTE — Telephone Encounter (Signed)
Spoke with patient by phone and confirmed appointments with Dr. Truett Perna and Dr.Moody on 06/06/13.  Contact names, numbers,and directions were provided.

## 2013-05-29 NOTE — Progress Notes (Signed)
Re:   [name] DOB:   06/20/1961 MRN:   [id number]  ASSESSMENT AND PLAN: 1.  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 Jennye Boroughs colonoscopy.  CT scan suggests enlarged lymph nodes.  Plan:  1) LFT/CEA, 2) Medical and radiation oncology consults (discussed with Dr. Leonard Schwartz. Sherrill), 3) Rectal [place] for staging (discussed with Dr. Chip Boer), 4) Probable neo adjuvant chemoradiation  2.  I discussed possible power port placement  The indications and risks of the procedure were explained to the patient.  The risks include, but are not limited to, bleeding, infection, pneumothorax, and thrombosis.  Her husband has some experience with this in that his daughter had brain cancer many years ago. 3.  DJD  Has seen Dr. Rande Brunt 4.  Anxiety 5.  IUD - stuck  Sees Dr. Renato Shin 6.  Indeterminate 6 mm liver lesion seen on CT scan   Chief Complaint  Patient presents with  . New Evaluation    New Cancer   REFERRING PHYSICIAN:  Dr. Stephens Shire  HISTORY OF PRESENT ILLNESS: [name] is a 52 y.o. (DOB: 1960-07-16)  white  female whose primary care physician is NEAL,W RONALD, MD and comes to me today for new rectal cancer.  [name] is with patient.  [name] has had no prior GI problems, though she has had rectal bleeding on and off for years.  She has attributed this to hemorrhoids and it has never bothered her.  Though over the last 2 months or so, she has noticed more blood than normal.  She has also felt more bloated, though she is not sure that her bowel movements have changed.  She has had no prior abdominal surgery.  She has no gastric, liver, or pancreatic disease.  No one in her family has had colon cancer.  In part because she is 52 and in part because of the blood in her stool, [name] had a colonoscopy by Dr. Stephens Shire on 05/24/2013.  She was found to have a tumor at 12 - 14 cm from the  anal verge.  Biopsies (Accession:  [id number]) showed invasive adenocarcinoma arising in a tubulovillous adenoma.  She also had tubular adenomas of the cecum and descending colon.  She underwent CT abd/pelvis yesterday, 05/28/2013 - 1. Large amount of free intraperitoneal air likely due to a perforation during the colonoscopy that sealed off. No leaking oral contrast, free fluid or abscess. 2. Asymmetric wall thickening involving the rectosigmoid colon likely colon cancer. There are enlarged lymph nodes in the mesocolon highly suspicious for metastatic disease. 3. Indeterminate 6 mm liver lesion on image number 26.   Past Medical History  Diagnosis Date  . No pertinent past medical history   . Arthritis     Past Surgical History  Procedure Laterality Date  . Back surgery    . Cesarean section      x2  . Dand c    . Breat reduction    . Joint replacement      lt knee  . Cervical disc arthroplasty  01/03/2012    Procedure: CERVICAL ANTERIOR DISC ARTHROPLASTY;  Surgeon: Temple Pacini, MD;  Location: [facility];  Service: Neurosurgery;  Laterality: N/A;  Cervical Anterior Disc Arthorplasty Five-Six  . Breast surgery        Current Outpatient Prescriptions  Medication Sig Dispense Refill  .  ALPRAZolam (XANAX) 0.5 MG tablet       . estradiol (VIVELLE-DOT) 0.05 MG/24HR Place 1 patch onto the skin 2 (two) times a week. Sundays and Wednesday      . hydrochlorothiazide (HYDRODIURIL) 25 MG tablet       . progesterone (PROMETRIUM) 200 MG capsule       . sertraline (ZOLOFT) 50 MG tablet        No current facility-administered medications for this visit.     No Known Allergies  REVIEW OF SYSTEMS: Skin:  No history of rash.  No history of abnormal moles. Infection:  No history of hepatitis or HIV.  No history of MRSA. Neurologic:  No history of stroke.  No history of seizure.  No history of headaches. Cardiac:  No history of hypertension. No history of heart disease.  No history of prior  cardiac catheterization.  No history of seeing a cardiologist. Pulmonary:  Does not smoke cigarettes.  No asthma or bronchitis.  No OSA/CPAP.  Endocrine:  No diabetes. No thyroid disease. Gastrointestinal:  See HPI. Urologic:  No history of kidney stones.  No history of bladder infections. GYN:  Has IUD (Merina) stuck in uterus.  This was placed in 2006.  If she needs surgery, she wants this removed. Musculoskeletal:  Dr. Gus Puma back surgery - 2011.  Dr. Rande Brunt - C5-6 disk surgery - 01/03/2012.  She still has back disease, but this seems to be doing better.  She has a little achiness to her arms, which is unexplained at this time. Hematologic:  No bleeding disorder.  No history of anemia.  Not anticoagulated. Psycho-social:  The patient is oriented.   She has been under a lot of stress.  SOCIAL and FAMILY HISTORY: Married.  [name] is with patient. (2nd marriage x 7 years) (Her first husband is dead, but she described him as a "shit") Her second husband had a daughter that went through treatment for a brain tumor, so he has some insight into the treatment of cancer. 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 She has a sone who is bipolar/anorexic/metally compromised  PHYSICAL EXAM: BP 120/78  Pulse 68  Temp(Src) 98.4 F (36.9 C) (Temporal)  Resp 14  Ht 5\' 4"  (1.626 m)  Wt 150 lb 12.8 oz (68.402 kg)  BMI 25.87 kg/m2  General: WN WF who is alert and generally healthy appearing.  HEENT: Normal. Pupils equal. Neck: Supple. No mass.  No thyroid mass.  Scar at base of right neck from cervical disk surgery. Lymph Nodes:  No supraclavicular or cervical nodes. Breasts:  She has the scars of a reduction mammoplasty.  No palpable mass in either breast. Lungs: Clear to auscultation and symmetric breath sounds. Heart:  RRR. No murmur or rub. Abdomen: Soft. No mass. No hernia. Normal bowel sounds.  No abdominal scars.  Mildly distended.  No peritoneal signs, but a little  uncomfortable. Rectal: I do not feel the mass on rectal exam, though I may feel the bottom of it. Extremities:  Good strength and ROM  in upper and lower extremities. Neurologic:  Grossly intact to motor and sensory function. Psychiatric: Has normal mood and affect. Behavior is normal.   DATA REVIEWED: CT scan and noted/pictures from Dr. Kinnie Scales.    Photo provided by Dr. Kinnie Scales of high rectal tumor (12 - 14 cm from anal verge) on colonoscopy (05/24/2013).   Ovidio Kin, MD,  [facility] [address] [place]  [phone]  [place], [place]    [place] Phone:  [phone] FAX:  [phone]

## 2013-05-30 ENCOUNTER — Other Ambulatory Visit: Payer: Self-pay | Admitting: Gastroenterology

## 2013-05-31 ENCOUNTER — Ambulatory Visit (HOSPITAL_COMMUNITY)
Admission: RE | Admit: 2013-05-31 | Discharge: 2013-05-31 | Disposition: A | Discharge status: Home / Self Care | Payer: 59 | Source: Ambulatory Visit | Attending: Gastroenterology | Admitting: Gastroenterology

## 2013-05-31 ENCOUNTER — Encounter (HOSPITAL_COMMUNITY)
Admission: RE | Disposition: A | Discharge status: Home / Self Care | Payer: Self-pay | Source: Ambulatory Visit | Attending: Gastroenterology

## 2013-05-31 ENCOUNTER — Encounter (HOSPITAL_COMMUNITY): Payer: Self-pay | Admitting: *Deleted

## 2013-05-31 DIAGNOSIS — F411 Generalized anxiety disorder: Secondary | ICD-10-CM | POA: Insufficient documentation

## 2013-05-31 DIAGNOSIS — C20 Malignant neoplasm of rectum: Secondary | ICD-10-CM | POA: Insufficient documentation

## 2013-05-31 DIAGNOSIS — Z96659 Presence of unspecified artificial knee joint: Secondary | ICD-10-CM | POA: Insufficient documentation

## 2013-05-31 DIAGNOSIS — R599 Enlarged lymph nodes, unspecified: Secondary | ICD-10-CM | POA: Insufficient documentation

## 2013-05-31 DIAGNOSIS — Z79899 Other long term (current) drug therapy: Secondary | ICD-10-CM | POA: Insufficient documentation

## 2013-05-31 HISTORY — PX: EUS: SHX5427

## 2013-05-31 SURGERY — ULTRASOUND, LOWER GI TRACT, ENDOSCOPIC
Anesthesia: Moderate Sedation

## 2013-05-31 MED ORDER — DIPHENHYDRAMINE HCL 50 MG/ML IJ SOLN
INTRAMUSCULAR | Status: DC | PRN
  Administered 2013-05-31: 25 mg via INTRAVENOUS

## 2013-05-31 MED ORDER — FENTANYL CITRATE 0.05 MG/ML IJ SOLN
INTRAMUSCULAR | Status: DC | PRN
  Administered 2013-05-31 (×3): 25 ug via INTRAVENOUS

## 2013-05-31 MED ORDER — MIDAZOLAM HCL 10 MG/2ML IJ SOLN
INTRAMUSCULAR | Status: DC | PRN
  Administered 2013-05-31 (×3): 2.5 mg via INTRAVENOUS

## 2013-05-31 MED ORDER — MIDAZOLAM HCL 10 MG/2ML IJ SOLN
INTRAMUSCULAR | Status: AC
  Filled 2013-05-31: qty 4

## 2013-05-31 MED ORDER — SODIUM CHLORIDE 0.9 % IV SOLN
INTRAVENOUS | Status: DC
  Administered 2013-05-31: 500 mL via INTRAVENOUS

## 2013-05-31 MED ORDER — FENTANYL CITRATE 0.05 MG/ML IJ SOLN
INTRAMUSCULAR | Status: AC
  Filled 2013-05-31: qty 4

## 2013-05-31 MED ORDER — DIPHENHYDRAMINE HCL 50 MG/ML IJ SOLN
INTRAMUSCULAR | Status: AC
  Filled 2013-05-31: qty 1

## 2013-05-31 NOTE — Interval H&P Note (Signed)
History and Physical Interval Note:  05/31/2013 8:55 AM  [name]  has presented today for surgery, with the diagnosis of rectal mass  The various methods of treatment have been discussed with the patient and family. After consideration of risks, benefits and other options for treatment, the patient has consented to  Procedure(s): LOWER ENDOSCOPIC ULTRASOUND (EUS) (N/A) as a surgical intervention .  [name] history has been reviewed, patient examined, no change in status, stable for surgery.  I have reviewed the patient's chart and labs.  Questions were answered to the patient's satisfaction.    Upon review of the chart I noted that the CT scan reported free air after her colonoscopy with Dr. Kinnie Scales.  She was uncomfortable with her symptoms after the colonoscopy last weekend.  She was evaluated by Dr. Ezzard Standing, who was made aware of the perforation by the patient.  I spoke with Dr. Jamey Ripa this morning and was not able to make a definitive conclusion about proceeding with the rectal EUS.  Currently the patient does not have peritoneal signs.  Her discomfort is mostly from the abdominal distention.  The CT scan was negative for any evidence of contrast leakage.  Since the EUS technique is to minimize air, I think it will be safe to perform the procedure.  I will be cautious about inserting the echoendoscope.  I may not have a complete examination, i.e., advancing to 25-30 cm, but it will be more than adequate to stage the depth of invasion.  The patient understands the risks and wishes to proceed.   Randall Colden D

## 2013-05-31 NOTE — H&P (View-Only) (Signed)
 Re:   [name] DOB:   11/22/1960 MRN:   [id number]  ASSESSMENT AND PLAN: 1.  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 Medoff's colonoscopy.  CT scan suggests enlarged lymph nodes.  Plan:  1) LFT/CEA, 2) Medical and radiation oncology consults (discussed with Dr. B. Sherrill), 3) Rectal US for staging (discussed with Dr. P. Hung), 4) Probable neo adjuvant chemoradiation  2.  I discussed possible power port placement  The indications and risks of the procedure were explained to the patient.  The risks include, but are not limited to, bleeding, infection, pneumothorax, and thrombosis.  Her husband has some experience with this in that his daughter had brain cancer many years ago. 3.  DJD  Has seen Dr. A. Poole 4.  Anxiety 5.  IUD - stuck  Sees Dr. R. Neal 6.  Indeterminate 6 mm liver lesion seen on CT scan   Chief Complaint  Patient presents with  . New Evaluation    New Cancer   REFERRING PHYSICIAN:  Dr. J. Medoff  HISTORY OF PRESENT ILLNESS: [name] is a 52 y.o. (DOB: 02/14/1961)  white  female whose primary care physician is NEAL,W RONALD, MD and comes to me today for new rectal cancer.  Husband [name] is with patient.  [name] has had no prior GI problems, though she has had rectal bleeding on and off for years.  She has attributed this to hemorrhoids and it has never bothered her.  Though over the last 2 months or so, she has noticed more blood than normal.  She has also felt more bloated, though she is not sure that her bowel movements have changed.  She has had no prior abdominal surgery.  She has no gastric, liver, or pancreatic disease.  No one in her family has had colon cancer.  In part because she is 52 and in part because of the blood in her stool, [name] had a colonoscopy by Dr. J. Medoff on 05/24/2013.  She was found to have a tumor at 12 - 14 cm from the  anal verge.  Biopsies (Accession:  [id number]) showed invasive adenocarcinoma arising in a tubulovillous adenoma.  She also had tubular adenomas of the cecum and descending colon.  She underwent CT abd/pelvis yesterday, 05/28/2013 - 1. Large amount of free intraperitoneal air likely due to a perforation during the colonoscopy that sealed off. No leaking oral contrast, free fluid or abscess. 2. Asymmetric wall thickening involving the rectosigmoid colon likely colon cancer. There are enlarged lymph nodes in the mesocolon highly suspicious for metastatic disease. 3. Indeterminate 6 mm liver lesion on image number 26.   Past Medical History  Diagnosis Date  . No pertinent past medical history   . Arthritis     Past Surgical History  Procedure Laterality Date  . Back surgery    . Cesarean section      x2  . Dand c    . Breat reduction    . Joint replacement      lt knee  . Cervical disc arthroplasty  01/03/2012    Procedure: CERVICAL ANTERIOR DISC ARTHROPLASTY;  Surgeon: Henry A Pool, MD;  Location: [facility];  Service: Neurosurgery;  Laterality: N/A;  Cervical Anterior Disc Arthorplasty Five-Six  . Breast surgery        Current Outpatient Prescriptions  Medication Sig Dispense Refill  .   ALPRAZolam (XANAX) 0.5 MG tablet       . estradiol (VIVELLE-DOT) 0.05 MG/24HR Place 1 patch onto the skin 2 (two) times a week. Sundays and Wednesday      . hydrochlorothiazide (HYDRODIURIL) 25 MG tablet       . progesterone (PROMETRIUM) 200 MG capsule       . sertraline (ZOLOFT) 50 MG tablet        No current facility-administered medications for this visit.     No Known Allergies  REVIEW OF SYSTEMS: Skin:  No history of rash.  No history of abnormal moles. Infection:  No history of hepatitis or HIV.  No history of MRSA. Neurologic:  No history of stroke.  No history of seizure.  No history of headaches. Cardiac:  No history of hypertension. No history of heart disease.  No history of prior  cardiac catheterization.  No history of seeing a cardiologist. Pulmonary:  Does not smoke cigarettes.  No asthma or bronchitis.  No OSA/CPAP.  Endocrine:  No diabetes. No thyroid disease. Gastrointestinal:  See HPI. Urologic:  No history of kidney stones.  No history of bladder infections. GYN:  Has IUD (Merina) stuck in uterus.  This was placed in 2006.  If she needs surgery, she wants this removed. Musculoskeletal:  Dr. R. Giofre back surgery - 2011.  Dr. A. Poole - C5-6 disk surgery - 01/03/2012.  She still has back disease, but this seems to be doing better.  She has a little achiness to her arms, which is unexplained at this time. Hematologic:  No bleeding disorder.  No history of anemia.  Not anticoagulated. Psycho-social:  The patient is oriented.   She has been under a lot of stress.  SOCIAL and FAMILY HISTORY: Married.  Husband [name] is with patient. (2nd marriage x 7 years) (Her first husband is dead, but she described him as a "shit") Her second husband had a daughter that went through treatment for a brain tumor, so he has some insight into the treatment of cancer. 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 She has a sone who is bipolar/anorexic/metally compromised  PHYSICAL EXAM: BP 120/78  Pulse 68  Temp(Src) 98.4 F (36.9 C) (Temporal)  Resp 14  Ht 5' 4" (1.626 m)  Wt 150 lb 12.8 oz (68.402 kg)  BMI 25.87 kg/m2  General: WN WF who is alert and generally healthy appearing.  HEENT: Normal. Pupils equal. Neck: Supple. No mass.  No thyroid mass.  Scar at base of right neck from cervical disk surgery. Lymph Nodes:  No supraclavicular or cervical nodes. Breasts:  She has the scars of a reduction mammoplasty.  No palpable mass in either breast. Lungs: Clear to auscultation and symmetric breath sounds. Heart:  RRR. No murmur or rub. Abdomen: Soft. No mass. No hernia. Normal bowel sounds.  No abdominal scars.  Mildly distended.  No peritoneal signs, but a little  uncomfortable. Rectal: I do not feel the mass on rectal exam, though I may feel the bottom of it. Extremities:  Good strength and ROM  in upper and lower extremities. Neurologic:  Grossly intact to motor and sensory function. Psychiatric: Has normal mood and affect. Behavior is normal.   DATA REVIEWED: CT scan and noted/pictures from Dr. Medoff.    Photo provided by Dr. Medoff of high rectal tumor (12 - 14 cm from anal verge) on colonoscopy (05/24/2013).   Amous Crewe, MD,  FACS [facility] [address]  [address]     [place], [place]    [address] Phone:  [phone] FAX:  [phone]  

## 2013-05-31 NOTE — Op Note (Signed)
[facility] [address] [place], [address]   OPERATIVE PROCEDURE REPORT  PATIENT: [name], [name]  MR#: [id number] BIRTHDATE: [birth year]  GENDER: Female ENDOSCOPIST: Jeani Hawking, MD REFERRED BY:  Ovidio Kin, M.D. PROCEDURE DATE:  05/31/2013 PROCEDURE:   Rectal EUS ASA CLASS:   Class II INDICATIONS: Rectal Cancer MEDICATIONS: Versed 7 mg IV, Fentanyl 75 mcg IV, and Benadryl 25 mg IV  DESCRIPTION OF PROCEDURE:   After the risks benefits and alternatives of the procedure were thoroughly explained, informed consent was obtained.  Throughout the procedure, the patients blood pressure, pulse and oxygen saturations were monitored continuously. Under direct visualization, the     endoscope was introduced through the anus  and advanced to the sigmoid colon .  Water was used as necessary to provide an acoustic interface.  Imaging was obtained at 7.5 and . Upon completion of the imaging, water was removed and the patient was sent to the recovery room in satisfactory condition.   FINDINGS: Upon initial entry into the rectum there was evidence of solid stool. Minimal insufflation was used during the procedure. In fact, a good portion of the lumen was able to be visualized using the air already present in the rectum.  Water infusion was performed to wash away the solid stool.  The tattoo site of the cancer was identified.  The lesion extended 11-14 cm from the anal verge.  Several measurements were obtained of the mass and it averaged 4 cm in length.  With the echoendoscope the lesion exhibited pseudopodia, which stages the disease as a T3.  Two peritumoral lymph nodes were identified measuring 14 mm and 6 mm. Because of the recent perforation, I did not advance the echoendoscope any further that the proximal limit of the cancer. Again, extreme care was taken to minimize air insufflation and any residual air was aspirated prior to the end of the procedure.    The scope was then withdrawn from the patient and the procedure terminated.  COMPLICATIONS: There were no complications [      ] ENDOSCOPIC IMPRESSION: 1) T3N2Mx rectal cancer RECOMMENDATIONS: 1) Management per Dr. Ezzard Standing and Oncology  _______________________________ eSigned:  Jeani Hawking, MD 05/31/2013 9:38 AM   VH:QIONG Ezzard Standing, MD Ritta Slot, MD

## 2013-06-03 ENCOUNTER — Encounter (HOSPITAL_COMMUNITY): Payer: Self-pay | Admitting: Gastroenterology

## 2013-06-05 ENCOUNTER — Encounter: Payer: Self-pay | Admitting: Radiation Oncology

## 2013-06-05 NOTE — Progress Notes (Signed)
GI Location of Tumor / Histology:Rectal Adenocarcinoma   Patient presented months ago with symptoms of: rectal bleeding on and off for years ,past 2 months more bleeding and bloated  Biopsies of  (if applicable) revealed: Invasiver adenocarcinoma arising in a tubulovillous adenoma  Past/Anticipated interventions by surgeon, if any: Colonoscopy= 05/24/13 Dr. Stephens Shire =tumor 12-14 cm from the anal verge  Past/Anticipated interventions by medical oncology, if any: CT 05/28/13 abd/pelvis=2. Asymmetric wall thickening involving the rectosigmoid colon  likely colon cancer. There are enlarged lymph nodes in the mesocolon  highly suspicious for metastatic disease.  3. Indeterminate 6 mm liver lesion on image number 26.    Weight changes, if any: no Bowel/Bladder complaints, if [id number]  Rectal bleeding and bloating past 2 months  Nausea / Vomiting, if any: no  Pain issues, if any:  STIFF NECK, sob difficulty brathing from c02 in stomach from colonoscopy perforation  SAFETY ISSUES:  Prior radiation? No  Pacemaker/ICD? No  Possible current pregnancy? No  Is the patient on methotrexate? 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

## 2013-06-06 ENCOUNTER — Ambulatory Visit
Admission: RE | Admit: 2013-06-06 | Discharge: 2013-06-06 | Disposition: A | Discharge status: Home / Self Care | Payer: 59 | Source: Ambulatory Visit | Attending: Radiation Oncology | Admitting: Radiation Oncology

## 2013-06-06 ENCOUNTER — Ambulatory Visit (HOSPITAL_BASED_OUTPATIENT_CLINIC_OR_DEPARTMENT_OTHER): Payer: 59 | Admitting: Oncology

## 2013-06-06 ENCOUNTER — Ambulatory Visit (HOSPITAL_BASED_OUTPATIENT_CLINIC_OR_DEPARTMENT_OTHER): Payer: 59

## 2013-06-06 ENCOUNTER — Telehealth: Payer: Self-pay | Admitting: Oncology

## 2013-06-06 ENCOUNTER — Telehealth: Payer: Self-pay | Admitting: *Deleted

## 2013-06-06 ENCOUNTER — Other Ambulatory Visit: Payer: Self-pay | Admitting: *Deleted

## 2013-06-06 ENCOUNTER — Encounter: Payer: Self-pay | Admitting: Oncology

## 2013-06-06 VITALS — BP 124/77 | HR 83 | Temp 98.1°F | Resp 18 | Ht 64.0 in | Wt 146.5 lb

## 2013-06-06 DIAGNOSIS — C20 Malignant neoplasm of rectum: Secondary | ICD-10-CM

## 2013-06-06 DIAGNOSIS — K7689 Other specified diseases of liver: Secondary | ICD-10-CM | POA: Insufficient documentation

## 2013-06-06 DIAGNOSIS — R599 Enlarged lymph nodes, unspecified: Secondary | ICD-10-CM | POA: Insufficient documentation

## 2013-06-06 HISTORY — DX: Malignant neoplasm of colon, unspecified: C18.9

## 2013-06-06 NOTE — Progress Notes (Signed)
Checked in new patient with no financial issues. She has appt card and have not been to [place].

## 2013-06-06 NOTE — Progress Notes (Signed)
Per order from Dr. Truett Perna, request sent to pathology (J. Epps and K. Jarold Motto) to perform MSI/IHC testing on Accession # [id number].  Date of collection was 05-24-13.

## 2013-06-06 NOTE — Telephone Encounter (Signed)
gv pt appt schedule for december and january. pt aware that both central schedule [facility] will contact her re appts for scans and PT. per 12/17 note attached to rehab referral office has already attempted to contact pt. pt given office # to follow up w/them

## 2013-06-06 NOTE — Telephone Encounter (Signed)
Called and left voice message to call patient tomorrow her distress level is a 9, lots of factors, new dx rectal cancer her cell phone [phone] She is a Cytogeneticist,

## 2013-06-06 NOTE — Progress Notes (Signed)
[facility] New Patient Consult   Referring MD: Kegan Shepardson 52 y.o.  1960/08/29    Reason for Referral: Rectal cancer     HPI: She reports a history of intermittent rectal bleeding beginning in the spring of 2014. She was referred to Dr. Kinnie Scales and was taken to a colonoscopy on 05/24/2013. A sessile 10 mm polyp was resected from the cecum. A second polyp measuring 8 mm was resected from the proximal descending colon. A large fungating mass was noted at the distal sigmoid or proximal rectum that was 50% circumferential. The the proximal a site of lesion was tattooed at 15 cm and the distal side at 12 cm from the anus. Multiple biopsies were obtained.  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 The cecum and descending polyps were tubular adenomas without high-grade dysplasia or malignancy.  She reports developing abdominal bloating and pain following the procedure. A CT of the abdomen and pelvis on 05/28/2013 found the lung bases to be clear. A large amount of free intraperitoneal air was noted. No free fluid or leaking oral contrast to suggest an active perforation the 6 mm low-attenuation lesion in the liver was indeterminate. The adrenal glands and kidneys are unremarkable. Moderate asymmetric wall thickening at the rectosigmoid junction with enlarged lymph nodes in the mesocolon. No free pelvic fluid or abscess. IUD in the uterus.  She was referred to Dr. Ezzard Standing. He could not palpate the mass on rectal exam. He referred [name] to Dr. Elnoria Howard for an endoscopic ultrasound evaluation on 05/31/2013. Tumor was noted to extend from 11-14 cm from the anal verge. The tumor was staged as a T3 lesion. 2. Tumor surrounding lymph nodes were identified measuring 14 mm and 6 mm. The endoscope was not advanced beyond the proximal limit of the cancer secondary to the recent perforation.  She continues to have abdominal pain, but  this has improved. She has also noted exertional dyspnea since completing a colonoscopy procedure.  Past Medical History  Diagnosis Date  .  G2 P2    . Arthritis   .  rectal cancer   05/24/2013     Past Surgical History  Procedure Laterality Date  .  lumbar Back surgery    . Cesarean section      x2  . D and c    .  arthroscopy       lt knee  . Cervical disc arthroplasty  01/03/2012    Procedure: CERVICAL ANTERIOR DISC ARTHROPLASTY;  Surgeon: Temple Pacini, MD;  Location: [facility];  Service: Neurosurgery;  Laterality: N/A;  Cervical Anterior Disc Arthorplasty Five-Six  . Breast surgery    . Eus N/A 05/31/2013    Procedure: LOWER ENDOSCOPIC ULTRASOUND (EUS);  Surgeon: Theda Belfast, MD;  Location: [facility];  Service: Endoscopy;  Laterality: N/A;  . Breat reduction    . Intrauterine device insertion  2006    minera    Family history: No family history of cancer. Her mother had colon polyps. She has 3 brothers.   Current outpatient prescriptions:ALPRAZolam (XANAX) 0.5 MG tablet, 0.5 mg at bedtime as needed. , Disp: , Rfl: ;  estradiol (VIVELLE-DOT) 0.05 MG/24HR, Place 1 patch onto the skin 2 (two) times a week. Sundays and Wednesday, Disp: , Rfl: ;  hydrochlorothiazide (HYDRODIURIL) 25 MG tablet, Take 25 mg by mouth daily. , Disp: , Rfl: ;  progesterone (PROMETRIUM) 200 MG capsule, Take 200 mg by mouth.  Every 3 months, Disp: , Rfl:  sertraline (ZOLOFT) 50 MG tablet, Take 50 mg by mouth daily. , Disp: , Rfl: ;  TESTOSTERONE IM, Inject into the muscle. Injection quarterly, Disp: , Rfl:   Allergies: No Known Allergies  Social History: She works as an Conservator, museum/gallery. She does not use tobacco. Moderate alcohol use. No transfusion history. No risk factor for HIV or hepatitis.  History  Alcohol Use  . Yes    Comment: wweekends    History  Smoking status  . Never Smoker   Smokeless tobacco  . Not on file     ROS:   Positives include:  A complete ROS was otherwise  negative.  Physical Exam:  Blood pressure 124/77, pulse 83, temperature 98.1 F (36.7 C), temperature source Oral, resp. rate 18, height 5\' 4"  (1.626 m), weight 146 lb 8 oz (66.452 kg).  HEENT: Oropharynx without visible mass, neck without mass Lungs: Clear bilaterally Cardiac: Regular rate and rhythm Abdomen: No hepatomegaly, nontender, no mass, mildly distended  Vascular: No leg edema Lymph nodes: No cervical, supra-clavicular, axillary, or inguinal nodes Neurologic: Alert and oriented, the motor exam appears intact in the upper and lower extremities Skin: Confluence erythema at the upper anterior chest Musculoskeletal: No spine tenderness     CMP      Component Value Date/Time   NA 140 05/29/2013 1157   K 4.5 05/29/2013 1157   CL 102 05/29/2013 1157   CO2 29 05/29/2013 1157   GLUCOSE 92 05/29/2013 1157   BUN 7 05/29/2013 1157   CREATININE 0.73 05/29/2013 1157   CREATININE 0.77 06/06/2008 0730   CALCIUM 9.7 05/29/2013 1157   PROT 6.9 05/29/2013 1157   ALBUMIN 4.3 05/29/2013 1157   AST 17 05/29/2013 1157   ALT 14 05/29/2013 1157   ALKPHOS 53 05/29/2013 1157   BILITOT 0.3 05/29/2013 1157   GFRNONAA >60 06/06/2008 0730   GFRAA  Value: >60        The eGFR has been calculated using the MDRD equation. This calculation has not been validated in all clinical 06/06/2008 0730   CEA on 05/29/2013-3.2  Radiology: As per history of present illness, the CT abdomen/pelvis from 05/28/2013 was reviewed at the GI tumor conference on 06/05/2013    Assessment/Plan:   1.Rectal cancer, clinical stage III (T3 N1), status post a biopsy of a rectal mass on 05/24/2013 confirming adenocarcinoma. Endoscopic ultrasound 05/31/2013 measured the mass at 11 cm from the anal verge,uT3uN1.  2. Post colonoscopy bowel perforation confirmed on the abdomen CT 05/28/2013  3. Single indeterminate liver lesion on the abdomen CT 05/29/2039  4. History of cervical and lumbar disc disease  5. History  of HELLP syndrome with her first pregnancy   Disposition:   She has been diagnosed with rectal cancer. I discussed the diagnosis and treatment options with [name] and her husband. Her case was presented at the GI tumor conference 06/05/2013. The consensus recommendation is to proceed with neoadjuvant chemotherapy and radiation for the high rectal cancer.  We discussed the rationale behind neoadjuvant therapy. She will see Dr. Mitzi Hansen later today. I recommend concurrent capecitabine and radiation.  We reviewed the specific toxicities associated with capecitabine including the chance for nausea, mucositis, diarrhea, and hematologic toxicity. We discussed the rash, hyperpigmentation, and hand/foot syndrome associated with capecitabine. She will attend a chemotherapy teaching class.  Ms. Altemose will be scheduled for a staging chest CT and MRI of the liver. The plan is to begin concurrent therapy on 06/17/2013.  Approximately 50 minutes were spent with the patient today. The majority of the time was used for counseling and coordination of care.  Gavino Fouch 06/06/2013, 5:36 PM

## 2013-06-06 NOTE — Progress Notes (Signed)
Met with [name] and family. Explained role of nurse navigator. Educational information provided on colorectal cancer  [facility] resources provided to patient, including SW service information.  Referral made to dietician for diet education.  Referral made to outpatient PT for "prehabilitation".   Contact names and phone numbers were provided for entire [facility] team.  Teach back method was used.  No barriers to care identified.  Will continue to follow as needed.

## 2013-06-06 NOTE — Progress Notes (Signed)
Please see the Nurse Progress Note in the MD Initial Consult Encounter for this patient. 

## 2013-06-07 ENCOUNTER — Encounter: Payer: Self-pay | Admitting: Oncology

## 2013-06-07 ENCOUNTER — Ambulatory Visit
Admission: RE | Admit: 2013-06-07 | Discharge: 2013-06-07 | Disposition: A | Discharge status: Home / Self Care | Payer: 59 | Source: Ambulatory Visit | Attending: Radiation Oncology | Admitting: Radiation Oncology

## 2013-06-07 ENCOUNTER — Encounter: Payer: Self-pay | Admitting: *Deleted

## 2013-06-07 ENCOUNTER — Other Ambulatory Visit: Payer: Self-pay | Admitting: *Deleted

## 2013-06-07 DIAGNOSIS — Y842 Radiological procedure and radiotherapy as the cause of abnormal reaction of the patient, or of later complication, without mention of misadventure at the time of the procedure: Secondary | ICD-10-CM | POA: Insufficient documentation

## 2013-06-07 DIAGNOSIS — Z79899 Other long term (current) drug therapy: Secondary | ICD-10-CM | POA: Insufficient documentation

## 2013-06-07 DIAGNOSIS — K6289 Other specified diseases of anus and rectum: Secondary | ICD-10-CM | POA: Insufficient documentation

## 2013-06-07 DIAGNOSIS — R110 Nausea: Secondary | ICD-10-CM | POA: Insufficient documentation

## 2013-06-07 DIAGNOSIS — R197 Diarrhea, unspecified: Secondary | ICD-10-CM | POA: Insufficient documentation

## 2013-06-07 DIAGNOSIS — C20 Malignant neoplasm of rectum: Secondary | ICD-10-CM | POA: Insufficient documentation

## 2013-06-07 DIAGNOSIS — N949 Unspecified condition associated with female genital organs and menstrual cycle: Secondary | ICD-10-CM | POA: Insufficient documentation

## 2013-06-07 DIAGNOSIS — Z510 Encounter for antineoplastic radiation therapy: Secondary | ICD-10-CM | POA: Insufficient documentation

## 2013-06-07 DIAGNOSIS — R5381 Other malaise: Secondary | ICD-10-CM | POA: Insufficient documentation

## 2013-06-07 MED ORDER — CAPECITABINE 500 MG PO TABS: 1500.0000 mg | ORAL_TABLET | Freq: Two times a day (BID) | ORAL | Status: DC

## 2013-06-07 NOTE — Progress Notes (Signed)
Radiation Oncology         [phone] ________________________________  Name: [name] MRN: [id number]  Date: 06/06/2013  DOB: 1961-04-29  WU:JWJX,B Renee Fast, MD  Kandis Cocking, MD   G. Rolm Baptise, M.D.  REFERRING PHYSICIAN: Kandis Cocking, MD   DIAGNOSIS: The encounter diagnosis was Rectal cancer.   HISTORY OF PRESENT ILLNESS::[name] is a 52 y.o. female who is seen for an initial consultation visit. The patient states that she noticed some occasional rectal bleeding beginning in the spring of 2014. This persisted and also was associated with some pain for a couple of days later in the fall. She proceeded to undergo a colonoscopy on 05/24/2013. A polyp was removed from the cecum and a second polyp was removed from the proximal descending colon. The most notable finding was a large fungating mass within the proximal rectum which was circumferential at 50%. The lesion was tattooed and the mass was felt to extend from 12-15 cm from the anus. Multiple biopsies were obtained and these returned positive for invasive adenocarcinoma from the rectal tumor. The additional polyps returned positive for tubular adenomas without high-grade dysplasia or malignancy.  Notably, the patient complained of some abdominal bloating after the colonoscopy procedure. A CT scan of the abdomen and pelvis was completed on 05/28/2013. A large amount of free intraperitoneal air was noted. No free fluid or leaking oral contrast was noted at this time. A small 6 Hebert low attenuation lesion was seen within the liver which was indeterminant. Some asymmetric wall thickening was present in the region of the proximal rectum/rectosigmoid junction and some enlarged lymph nodes were present near by.  The patient was referred to Dr. Ezzard Standing in surgery. The patient proceeded with an endoscopic ultrasound on 05/31/2013. Tumor was noted to extend from approximately 11-14 cm from the anal verge. On ultrasound, this  was described as [id number] lesion. Surrounding lymph nodes were measured at 14 and 6 Hebert.  The patient continues to complain of some bloating. This has been slowly improving. She has some occasional constipation, denies any diarrhea.   PREVIOUS RADIATION THERAPY: No   PAST MEDICAL HISTORY:  has a past medical history of No pertinent past medical history; Arthritis; and Colon cancer.     PAST SURGICAL HISTORY: Past Surgical History  Procedure Laterality Date  . Back surgery    . Cesarean section      x2  . Dand c    . Joint replacement      lt knee  . Cervical disc arthroplasty  01/03/2012    Procedure: CERVICAL ANTERIOR DISC ARTHROPLASTY;  Surgeon: Temple Pacini, MD;  Location: [facility];  Service: Neurosurgery;  Laterality: N/A;  Cervical Anterior Disc Arthorplasty Five-Six  . Breast surgery    . Eus N/A 05/31/2013    Procedure: LOWER ENDOSCOPIC ULTRASOUND (EUS);  Surgeon: Theda Belfast, MD;  Location: [facility];  Service: Endoscopy;  Laterality: N/A;  . Breat reduction    . Intrauterine device insertion  2006    minera     FAMILY HISTORY: family history is not on file.   SOCIAL HISTORY:  reports that she has never smoked. She does not have any smokeless tobacco history on file. She reports that she drinks alcohol. She reports that she does not use illicit drugs.   ALLERGIES: Review of patient's allergies indicates no known allergies.   MEDICATIONS:  Current Outpatient Prescriptions  Medication Sig Dispense Refill  . ALPRAZolam (XANAX) 0.5 MG tablet  0.5 mg at bedtime as needed.       Marland Kitchen estradiol (VIVELLE-DOT) 0.05 MG/24HR Place 1 patch onto the skin 2 (two) times a week. Sundays and Wednesday      . hydrochlorothiazide (HYDRODIURIL) 25 MG tablet Take 25 mg by mouth daily.       . progesterone (PROMETRIUM) 200 MG capsule Take 200 mg by mouth. Every 3 months      . sertraline (ZOLOFT) 50 MG tablet Take 50 mg by mouth daily.       . TESTOSTERONE IM Inject into the  muscle. Injection quarterly       No current facility-administered medications for this encounter.     REVIEW OF SYSTEMS:  A 15 point review of systems is documented in the electronic medical record. This was obtained by the nursing staff. However, I reviewed this with the patient to discuss relevant findings and make appropriate changes.  Pertinent items are noted in HPI.    PHYSICAL EXAM:  vitals were not taken for this visit.  ECOG = 1  0 - Asymptomatic (Fully active, able to carry on all predisease activities without restriction)  1 - Symptomatic but completely ambulatory (Restricted in physically strenuous activity but ambulatory and able to carry out work of a light or sedentary nature. For example, light housework, office work)  2 - Symptomatic, <50% in bed during the day (Ambulatory and capable of all self care but unable to carry out any work activities. Up and about more than 50% of waking hours)  3 - Symptomatic, >50% in bed, but not bedbound (Capable of only limited self-care, confined to bed or chair 50% or more of waking hours)  4 - Bedbound (Completely disabled. Cannot carry on any self-care. Totally confined to bed or chair)  5 - Death   [name], [name], [name], et al. [phone]). "Toxicity and response criteria of the [facility]". Am. [name]. Oncol. 5 (6): 649-55  General: Well-developed, in no acute distress HEENT: Normocephalic, atraumatic; oral cavity clear Neck: Supple without any lymphadenopathy Cardiovascular: Regular rate and rhythm Respiratory: Clear to auscultation bilaterally GI: Soft, nontender, normal bowel sounds Extremities: No edema present Neuro: No focal deficits Rectal:  No external lesions, and no rectal tumor palpated on exam, no blood on exam glove    LABORATORY DATA:  Lab Results  Component Value Date   WBC 5.3 01/02/2012   HGB 13.6 01/02/2012   HCT 39.7 01/02/2012   MCV 90.8 01/02/2012   PLT 218 01/02/2012    Lab Results  Component Value Date   NA 140 05/29/2013   K 4.5 05/29/2013   CL 102 05/29/2013   CO2 29 05/29/2013   Lab Results  Component Value Date   ALT 14 05/29/2013   AST 17 05/29/2013   ALKPHOS 53 05/29/2013   BILITOT 0.3 05/29/2013      RADIOGRAPHY: Ct Abdomen Pelvis W Wo Contrast  05/28/2013   CLINICAL DATA:  Abdominal pain and bloating. Status post colonoscopy 05/24/2013.  EXAM: CT ABDOMEN AND PELVIS WITHOUT AND WITH CONTRAST  TECHNIQUE: Multidetector CT imaging of the abdomen and pelvis was performed without contrast material in one or both body regions, followed by contrast material(s) and further sections in one or both body regions.  CONTRAST:  OMNIPAQUE IOHEXOL 300 MG/ML  SOLN  COMPARISON:  None.  FINDINGS: The lung bases are clear except for minimal dependent bibasilar atelectasis. No pleural effusion or pulmonary nodule.  There is a large amount  of free intraperitoneal air most consistent with a hollow viscus perforation and most likely related to a perforation from the recent colonoscopy. However, there is no free-fluid or leaking oral contrast to suggest an active perforation. I discussed these findings with Dr. Kinnie Scales and the patient does not had any fevers or elevated white count or peritoneal signs. The patient has an appointment to see a surgeon tomorrow.  The liver demonstrates a small low-attenuation lesion measuring 6 Hebert on image number 26. No other definite hepatic lesions. No biliary dilatation. The gallbladder is mildly contracted. No common bile duct dilatation. The pancreas is normal. The spleen is normal in size. No focal lesions. The adrenal glands and kidneys are unremarkable.  The stomach, duodenum and small bowel are unremarkable. There is moderate asymmetric wall thickening in the rectosigmoid junction region. There also abnormally enlarged lymph nodes in the mesocolon in this area. A left-sided lymph node on image number 79 measures 8.7 Hebert and a  right-sided lymph node on image number 74 measures 11 Hebert. Numerous other smaller nodes are present. This is likely the site of the recent biopsy and colon cancer. No leaking oral contrast from the site. No free pelvic fluid or pelvic abscess. No mesenteric or retroperitoneal mass or adenopathy. The aorta and branch vessels are normal. The portal, hepatic, splenic and renal veins are patent.  The uterus demonstrates an IUD. The ovaries are normal. The bladder is normal. Tiny amount of air in the bladder may be from a recent catheterization. No inguinal mass or adenopathy.  The bony structures are unremarkable.  IMPRESSION: 1. Large amount of free intraperitoneal air likely due to a perforation during the colonoscopy that sealed off. No leaking oral contrast, free fluid or abscess. 2. Asymmetric wall thickening involving the rectosigmoid colon likely colon cancer. There are enlarged lymph nodes in the mesocolon highly suspicious for metastatic disease. 3. Indeterminate 6 Hebert liver lesion on image number 26.   Electronically Signed   By: Loralie Champagne M.D.   On: 05/28/2013 14:51       IMPRESSION: The patient has a recent diagnosis of adenocarcinoma of the proximal rectum. This extends from approximately 11-14 cm from the anal verge. Based on the imaging thus far including endorectal ultrasound, this represents a T3N2M0 tumor. A small indeterminate lesion is seen within the liver.  [name] case was discussed in multidisciplinary GI conference. The consensus recommendation was to proceed with additional staging studies including a CT scan of the chest and MRI scan of the liver. Based on the information thus far, the patient felt to be a good candidate for preoperative chemoradiotherapy followed by surgical resection. I therefore discussed with the patient a potential 5-1/2 week course of radiation treatment which would be given with concurrent chemotherapy. She saw Dr. Truett Perna and medical oncology earlier  today to discuss chemotherapy. He plans to give the patient Xeloda.  I therefore discussed with the patient the rationale of radiation treatment in this setting. We discussed the local/regional control benefit as well as the possible implications on surgery. We also discussed the possible side effects and risks of treatment. All of her questions were answered. The patient wishes to proceed with this overall treatment plan.   PLAN: The patient has been scheduled for a simulation tomorrow. I anticipate beginning her radiation treatment on 06/17/2013.    I spent 60 minutes face to face with the patient and more than 50% of that time was spent in counseling and/or coordination of care.  ________________________________   Radene Gunning, MD, PhD

## 2013-06-07 NOTE — Progress Notes (Signed)
TOOK RX FOR XELODA TO [facility] AND IT WENT THRU WITH A $7.00 CO-PAY.  tHEY WILL CALL THE PATIENT.

## 2013-06-07 NOTE — Progress Notes (Signed)
[facility] Clinical Social Work  Clinical Social Work was referred by nurse for assessment of psychosocial needs due to high distress screen score.  Clinical Social Worker attempted to contact patient on her mobile number and left vm to call CSW back. CSW will continue to try to contact Pt to assess concerns.     Doreen Salvage, LCSW Clinical Social Worker [facility] Wednesday, Thursday and Friday Phone: [phone] Fax: [phone]

## 2013-06-10 ENCOUNTER — Encounter: Payer: Self-pay | Admitting: *Deleted

## 2013-06-10 ENCOUNTER — Ambulatory Visit: Payer: 59 | Attending: Oncology | Admitting: Physical Therapy

## 2013-06-10 ENCOUNTER — Telehealth: Payer: Self-pay | Admitting: *Deleted

## 2013-06-10 DIAGNOSIS — M629 Disorder of muscle, unspecified: Secondary | ICD-10-CM | POA: Insufficient documentation

## 2013-06-10 DIAGNOSIS — IMO0001 Reserved for inherently not codable concepts without codable children: Secondary | ICD-10-CM | POA: Insufficient documentation

## 2013-06-10 DIAGNOSIS — M2420 Disorder of ligament, unspecified site: Secondary | ICD-10-CM | POA: Insufficient documentation

## 2013-06-10 NOTE — Progress Notes (Signed)
Clinical Social Worker contacted pt to follow up regarding a high distress screen score.  Pt stated the majority of her distress was due to work, and the uncertainty of being able to work.  Pt stated that she has resolve her work issues, which has alleviated some distress.  CSW and pt discussed the support team and support programs at [facility], and pt was very interested.  CSW and pt plan to meet after her appointment next week to further review the support programs and resources.  CSW encouraged pt to call with any additional questions or concerns.  Tamala Julian, MSW, LCSW Clinical Social Worker [facility] [phone]

## 2013-06-10 NOTE — Telephone Encounter (Signed)
Left voice message on home# re: request for flu vac with appt 06/11/13.  Notified pt that she has injection appt @ 1:30 12/23 after edu class and to call if questions.

## 2013-06-11 ENCOUNTER — Ambulatory Visit (HOSPITAL_BASED_OUTPATIENT_CLINIC_OR_DEPARTMENT_OTHER): Payer: 59

## 2013-06-11 ENCOUNTER — Encounter: Payer: Self-pay | Admitting: Oncology

## 2013-06-11 ENCOUNTER — Ambulatory Visit: Payer: 59

## 2013-06-11 ENCOUNTER — Ambulatory Visit: Payer: 59 | Admitting: Nutrition

## 2013-06-11 ENCOUNTER — Ambulatory Visit (HOSPITAL_COMMUNITY)
Admission: RE | Admit: 2013-06-11 | Discharge: 2013-06-11 | Disposition: A | Discharge status: Home / Self Care | Payer: 59 | Source: Ambulatory Visit | Attending: Oncology | Admitting: Oncology

## 2013-06-11 ENCOUNTER — Other Ambulatory Visit: Payer: Self-pay | Admitting: *Deleted

## 2013-06-11 ENCOUNTER — Other Ambulatory Visit (HOSPITAL_BASED_OUTPATIENT_CLINIC_OR_DEPARTMENT_OTHER): Payer: 59

## 2013-06-11 VITALS — BP 122/66 | HR 71 | Temp 98.7°F

## 2013-06-11 DIAGNOSIS — C20 Malignant neoplasm of rectum: Secondary | ICD-10-CM

## 2013-06-11 DIAGNOSIS — K668 Other specified disorders of peritoneum: Secondary | ICD-10-CM | POA: Insufficient documentation

## 2013-06-11 DIAGNOSIS — R05 Cough: Secondary | ICD-10-CM | POA: Insufficient documentation

## 2013-06-11 DIAGNOSIS — Z23 Encounter for immunization: Secondary | ICD-10-CM

## 2013-06-11 DIAGNOSIS — R059 Cough, unspecified: Secondary | ICD-10-CM | POA: Insufficient documentation

## 2013-06-11 LAB — CBC WITH DIFFERENTIAL/PLATELET
Basophils Absolute: 0.1 10*3/uL (ref 0.0–0.1)
EOS%: 11.9 % — ABNORMAL HIGH (ref 0.0–7.0)
Eosinophils Absolute: 0.7 10*3/uL — ABNORMAL HIGH (ref 0.0–0.5)
HCT: 42.8 % (ref 34.8–46.6)
HGB: 14.6 g/dL (ref 11.6–15.9)
LYMPH%: 19 % (ref 14.0–49.7)
MCH: 31.1 pg (ref 25.1–34.0)
MCV: 91.1 fL (ref 79.5–101.0)
MONO#: 0.4 10*3/uL (ref 0.1–0.9)
MONO%: 6.8 % (ref 0.0–14.0)
NEUT#: 3.3 10*3/uL (ref 1.5–6.5)
NEUT%: 60.8 % (ref 38.4–76.8)
Platelets: 298 10*3/uL (ref 145–400)
RBC: 4.7 10*6/uL (ref 3.70–5.45)
RDW: 11.9 % (ref 11.2–14.5)

## 2013-06-11 MED ORDER — INFLUENZA VAC SPLIT QUAD 0.5 ML IM SUSP
0.5000 mL | INTRAMUSCULAR | Status: AC
  Administered 2013-06-11: 0.5 mL via INTRAMUSCULAR
  Filled 2013-06-11: qty 0.5

## 2013-06-11 NOTE — Progress Notes (Signed)
Per request of Dr. Truett Perna, patient was advised to report fever or pain due to the amount of free air as reported on today's CT scan of chest.  Patient stated she actually felt better since her last visit with Dr. Truett Perna.  She verbalized comprehension of reporting any changes in her overall condition, including pain, SOB, fever, or chills.

## 2013-06-11 NOTE — Progress Notes (Signed)
Put disability paper on nurse's desk. °

## 2013-06-11 NOTE — Progress Notes (Signed)
This patient is a 52 year old female diagnosed with rectal cancer.  She is a patient of Dr. Truett Perna and Dr. Mitzi Hansen.  Past medical history includes arthritis.  Medications include Xanax, Xeloda, and Zoloft.  Height: 64 inches.   Weight: 146.5 pounds. Usual body weight 146 pounds. BMI: 25.13.  Patient reports nausea on a fairly regular basis.  She denies weight loss or difficulty eating at this time.  She has altered her diet and avoids greasy foods and overly sweet foods.  Nutrition diagnosis: Food and nutrition related knowledge deficit related to new diagnosis of rectal cancer and pending treatments as evidenced by no prior need for nutrition related information.  Intervention: Patient was educated to consume small, frequent meals and snacks throughout the day to promote weight maintenance.  I educated her on protein sources in her diet and encouraged her to consume proteins frequently throughout the day.  I stressed the importance of weight maintenance.  Patient was educated on strategies for eating with nausea, vomiting, diarrhea.  I answered patient's questions.  Teach back method used.  Contact information and fact sheets were provided along with oral nutrition supplement samples.  Monitoring, evaluation, goals: Patient will tolerate adequate calories and protein to promote weight maintenance throughout treatment.  Next visit: Patient will contact me with questions or concerns.  I will follow patient as needed throughout treatment.

## 2013-06-17 ENCOUNTER — Ambulatory Visit
Admission: RE | Admit: 2013-06-17 | Discharge: 2013-06-17 | Disposition: A | Discharge status: Home / Self Care | Payer: 59 | Source: Ambulatory Visit | Attending: Radiation Oncology | Admitting: Radiation Oncology

## 2013-06-17 ENCOUNTER — Encounter: Payer: Self-pay | Admitting: Oncology

## 2013-06-17 ENCOUNTER — Ambulatory Visit (HOSPITAL_COMMUNITY)
Admission: RE | Admit: 2013-06-17 | Discharge: 2013-06-17 | Disposition: A | Discharge status: Home / Self Care | Payer: 59 | Source: Ambulatory Visit | Attending: Oncology | Admitting: Oncology

## 2013-06-17 DIAGNOSIS — C20 Malignant neoplasm of rectum: Secondary | ICD-10-CM

## 2013-06-17 DIAGNOSIS — K668 Other specified disorders of peritoneum: Secondary | ICD-10-CM | POA: Insufficient documentation

## 2013-06-17 DIAGNOSIS — K7689 Other specified diseases of liver: Secondary | ICD-10-CM | POA: Insufficient documentation

## 2013-06-17 DIAGNOSIS — N281 Cyst of kidney, acquired: Secondary | ICD-10-CM | POA: Insufficient documentation

## 2013-06-17 MED ORDER — GADOBENATE DIMEGLUMINE 529 MG/ML IV SOLN
15.0000 mL | Freq: Once | INTRAVENOUS | Status: AC
  Administered 2013-06-17: 13 mL via INTRAVENOUS

## 2013-06-17 NOTE — Progress Notes (Signed)
Faxed disability paper to [employer] @ [phone].

## 2013-06-18 ENCOUNTER — Telehealth: Payer: Self-pay | Admitting: *Deleted

## 2013-06-18 ENCOUNTER — Ambulatory Visit
Admission: RE | Admit: 2013-06-18 | Discharge: 2013-06-18 | Disposition: A | Discharge status: Home / Self Care | Payer: 59 | Source: Ambulatory Visit | Attending: Radiation Oncology | Admitting: Radiation Oncology

## 2013-06-18 DIAGNOSIS — C20 Malignant neoplasm of rectum: Secondary | ICD-10-CM

## 2013-06-18 NOTE — Telephone Encounter (Signed)
Spoke with patient by phone.  She denies pain, fever, SOB, or other complaints.  She started taking her Xeloda this morning as prescribed.  We reviewed side effects and handling of drug.  She verbalized comprehension and denied questions.  She understands to contact the MD immediately if she experiences pain fever, SOB, or other unusual side effects.  Dr. Truett Perna informed of above as he was notified of MR results from 06/17/13 by radiologist..

## 2013-06-18 NOTE — Progress Notes (Signed)
  Radiation Oncology         [phone] ________________________________  Name: [name] MRN: [id number]  Date: 06/17/2013  DOB: Apr 07, 1961  Simulation Verification Note   NARRATIVE: The patient was brought to the treatment unit and placed in the planned treatment position. The clinical setup was verified. Then port films were obtained and uploaded to the radiation oncology medical record software.  The treatment beams were carefully compared against the planned radiation fields. The position, location, and shape of the radiation fields was reviewed. The targeted volume of tissue appears to be appropriately covered by the radiation beams. Based on my personal review, I approved the simulation verification. [name] treatment will proceed as planned.  ________________________________   Radene Gunning, MD, PhD

## 2013-06-18 NOTE — Progress Notes (Addendum)
Patient education  Done, rad book and sitz bath given,discusses pain, skin irritation,fatigue, bladder and bowel  changes, diarrhea,   Baby wipes flushable ones to use, bottle spray to spray bottom after bm's, Diet, to get imodium ad and have on hand prn, sitz bath and how to use, no hot water, use warm water only ,teach back given no c/o pain or discomfort as yet,gave my business card as well 1:01 PM

## 2013-06-19 ENCOUNTER — Other Ambulatory Visit: Payer: Self-pay | Admitting: *Deleted

## 2013-06-19 ENCOUNTER — Ambulatory Visit
Admission: RE | Admit: 2013-06-19 | Discharge: 2013-06-19 | Disposition: A | Discharge status: Home / Self Care | Payer: 59 | Source: Ambulatory Visit | Attending: Radiation Oncology | Admitting: Radiation Oncology

## 2013-06-19 NOTE — Telephone Encounter (Signed)
THIS REFIL REQUEST FOR CAPECITABINE WAS GIVEN TO DR.SHERRILL'S NURSE, SUSAN COWARD,RN.

## 2013-06-19 NOTE — Telephone Encounter (Signed)
Confirmed with patient that she filled her Xeloda script at [facility] and has started medication. Does not know anything about the [facility] refill request. [facility]: They are not aware of the necessity to fill the Xeloda w/[facility]. She was dispensed #132 tablets (will only need #36 tabs on 07/18/13 to complete her chemo w/RT). Forwarded refill request to managed care to determine if she is being required to transfer this to mail order pharmacy.

## 2013-06-21 ENCOUNTER — Encounter: Payer: Self-pay | Admitting: Radiation Oncology

## 2013-06-21 ENCOUNTER — Ambulatory Visit
Admission: RE | Admit: 2013-06-21 | Discharge: 2013-06-21 | Disposition: A | Discharge status: Home / Self Care | Payer: 59 | Source: Ambulatory Visit | Attending: Radiation Oncology | Admitting: Radiation Oncology

## 2013-06-21 VITALS — BP 117/82 | HR 73 | Temp 98.2°F | Resp 20 | Wt 148.7 lb

## 2013-06-21 DIAGNOSIS — C20 Malignant neoplasm of rectum: Secondary | ICD-10-CM

## 2013-06-21 NOTE — Progress Notes (Signed)
weekly rad txs, rectal 3 completed, c/o nausea this am feels it is xeloda,  Had diarrhea this am will pick up imodium ad today some fatigue  having gas blating 2:39 PM

## 2013-06-21 NOTE — Progress Notes (Signed)
   Department of Radiation Oncology  Phone:  [phone] Fax:        [phone]  Weekly Treatment Note    Name: [name] Date: 06/21/2013 MRN: [id number] DOB: 1960-09-21   Current dose: 5.4 Gy  Current fraction: 3   MEDICATIONS: Current Outpatient Prescriptions  Medication Sig Dispense Refill  . ALPRAZolam (XANAX) 0.5 MG tablet 0.5 mg at bedtime as needed.       . capecitabine (XELODA) 500 MG tablet Take 3 tablets (1,500 mg total) by mouth 2 (two) times daily after a meal. On days of radiation only (Mon-Fri) Dx: 154.1  168 tablet  0  . estradiol (VIVELLE-DOT) 0.05 MG/24HR Place 1 patch onto the skin 2 (two) times a week. Sundays and Wednesday      . hydrochlorothiazide (HYDRODIURIL) 25 MG tablet Take 25 mg by mouth daily.       . progesterone (PROMETRIUM) 200 MG capsule Take 200 mg by mouth. Every 3 months      . sertraline (ZOLOFT) 50 MG tablet Take 50 mg by mouth daily.       . TESTOSTERONE IM Inject into the muscle. Injection quarterly       No current facility-administered medications for this encounter.     ALLERGIES: Review of patient's allergies indicates no known allergies.   LABORATORY DATA:  Lab Results  Component Value Date   WBC 5.5 06/11/2013   HGB 14.6 06/11/2013   HCT 42.8 06/11/2013   MCV 91.1 06/11/2013   PLT 298 06/11/2013   Lab Results  Component Value Date   NA 140 05/29/2013   K 4.5 05/29/2013   CL 102 05/29/2013   CO2 29 05/29/2013   Lab Results  Component Value Date   ALT 14 05/29/2013   AST 17 05/29/2013   ALKPHOS 53 05/29/2013   BILITOT 0.3 05/29/2013     NARRATIVE: [name] was seen today for weekly treatment management. The chart was checked and the patient's films were reviewed. The patient is doing relatively well in her first week of treatment. She did have some diarrhea today. She has had a different diet over the last couple of days including eating beans yesterday. She had one episode of some shoulder  pain which resolved after walking.  PHYSICAL EXAMINATION: weight is 148 lb 11.2 oz (67.45 kg). Her oral temperature is 98.2 F (36.8 C). Her blood pressure is 117/82 and her pulse is 73. Her respiration is 20.        ASSESSMENT: The patient is doing satisfactorily with treatment.  PLAN: We will continue with the patient's radiation treatment as planned. The patient will change her diet more to a low fiber diet. She also will begin using Imodium as necessary for loose stools.

## 2013-06-23 NOTE — Addendum Note (Signed)
Encounter addended by: Marye Round, MD on: 06/23/2013  6:20 PM<BR>     Documentation filed: Visit Diagnoses, Notes Section

## 2013-06-23 NOTE — Progress Notes (Signed)
  Radiation Oncology         ([phone]) [phone] ________________________________  Name: [name] MRN: [id number]  Date: 06/07/2013  DOB: 02/25/1961  SIMULATION AND TREATMENT PLANNING NOTE  The patient presented for simulation for the patient's upcoming course of preoperative radiation for the diagnosis of rectal cancer. The patient was placed in a supine position. A customized alpha cradle was constructed toaid in patient immobilization. This complex treatment device will be used on a daily basis during the treatment. In this fashion a CT scan was obtained through the pelvic region and the isocenter was placed near midline within the pelvis.  The patient will initially be planned to receive a course of radiation to a dose of 45 gray. This will be accomplished in 25 fractions at 1.8 gray per fraction. This initial treatment will correspond to a 3-D conformal technique. The gross tumor volume has been contoured in addition to the rectum, bladder and femoral heads. DVH's of each of these structures have been requested and these will be carefully reviewed as part of the 3-D conformal treatment planning process. To accomplish this initial treatment, 4 customized blocks have been designed for this purpose. Each of these 4 complex treatment devices will be used on a daily basis during the initial course of his treatment. It is anticipated that the patient will then receive a boost for an additional 5.4 gray. The anticipated total dose therefore will be 50.4 gray.  Special treatment procedure The patient will receive chemotherapy during the course of radiation treatment. The patient may experience increased or overlapping toxicity due to this combined-modality approach and the patient will be monitored for such problems. This may include extra lab work as necessary. This therefore constitutes a special treatment procedure.    ________________________________  Jodelle Gross, MD, PhD

## 2013-06-24 ENCOUNTER — Ambulatory Visit: Payer: 59 | Attending: Oncology | Admitting: Physical Therapy

## 2013-06-24 ENCOUNTER — Ambulatory Visit
Admission: RE | Admit: 2013-06-24 | Discharge: 2013-06-24 | Disposition: A | Discharge status: Home / Self Care | Payer: 59 | Source: Ambulatory Visit | Attending: Radiation Oncology | Admitting: Radiation Oncology

## 2013-06-24 DIAGNOSIS — M2420 Disorder of ligament, unspecified site: Secondary | ICD-10-CM | POA: Insufficient documentation

## 2013-06-24 DIAGNOSIS — IMO0001 Reserved for inherently not codable concepts without codable children: Secondary | ICD-10-CM | POA: Insufficient documentation

## 2013-06-24 DIAGNOSIS — M629 Disorder of muscle, unspecified: Secondary | ICD-10-CM | POA: Insufficient documentation

## 2013-06-25 ENCOUNTER — Ambulatory Visit
Admission: RE | Admit: 2013-06-25 | Discharge: 2013-06-25 | Disposition: A | Discharge status: Home / Self Care | Payer: 59 | Source: Ambulatory Visit | Attending: Radiation Oncology | Admitting: Radiation Oncology

## 2013-06-26 ENCOUNTER — Ambulatory Visit
Admission: RE | Admit: 2013-06-26 | Discharge: 2013-06-26 | Disposition: A | Discharge status: Home / Self Care | Payer: 59 | Source: Ambulatory Visit | Attending: Radiation Oncology | Admitting: Radiation Oncology

## 2013-06-27 ENCOUNTER — Ambulatory Visit
Admission: RE | Admit: 2013-06-27 | Discharge: 2013-06-27 | Disposition: A | Discharge status: Home / Self Care | Payer: 59 | Source: Ambulatory Visit | Attending: Radiation Oncology | Admitting: Radiation Oncology

## 2013-06-28 ENCOUNTER — Encounter: Payer: Self-pay | Admitting: Radiation Oncology

## 2013-06-28 ENCOUNTER — Ambulatory Visit
Admission: RE | Admit: 2013-06-28 | Discharge: 2013-06-28 | Disposition: A | Discharge status: Home / Self Care | Payer: 59 | Source: Ambulatory Visit | Attending: Radiation Oncology | Admitting: Radiation Oncology

## 2013-06-28 VITALS — BP 111/72 | HR 73 | Temp 97.4°F | Resp 20 | Wt 152.1 lb

## 2013-06-28 DIAGNOSIS — C20 Malignant neoplasm of rectum: Secondary | ICD-10-CM

## 2013-06-28 NOTE — Progress Notes (Addendum)
Pt denies pain, bowel, bladder issues, loss of appetite. She states she slept late today, has been "tired".

## 2013-06-28 NOTE — Progress Notes (Signed)
   Department of Radiation Oncology  Phone:  [phone] Fax:        [phone]  Weekly Treatment Note    Name: [name] Date: 06/28/2013 MRN: [id number] DOB: Mar 24, 1961   Current dose: 14.4 Gy  Current fraction: 8   MEDICATIONS: Current Outpatient Prescriptions  Medication Sig Dispense Refill  . ALPRAZolam (XANAX) 0.5 MG tablet 0.5 mg at bedtime as needed.       . capecitabine (XELODA) 500 MG tablet Take 3 tablets (1,500 mg total) by mouth 2 (two) times daily after a meal. On days of radiation only (Mon-Fri) Dx: 154.1  168 tablet  0  . estradiol (VIVELLE-DOT) 0.05 MG/24HR Place 1 patch onto the skin 2 (two) times a week. Sundays and Wednesday      . hydrochlorothiazide (HYDRODIURIL) 25 MG tablet Take 25 mg by mouth daily.       . progesterone (PROMETRIUM) 200 MG capsule Take 200 mg by mouth. Every 3 months      . sertraline (ZOLOFT) 50 MG tablet Take 50 mg by mouth daily.       . TESTOSTERONE IM Inject into the muscle. Injection quarterly       No current facility-administered medications for this encounter.     ALLERGIES: Review of patient's allergies indicates no known allergies.   LABORATORY DATA:  Lab Results  Component Value Date   WBC 5.5 06/11/2013   HGB 14.6 06/11/2013   HCT 42.8 06/11/2013   MCV 91.1 06/11/2013   PLT 298 06/11/2013   Lab Results  Component Value Date   NA 140 05/29/2013   K 4.5 05/29/2013   CL 102 05/29/2013   CO2 29 05/29/2013   Lab Results  Component Value Date   ALT 14 05/29/2013   AST 17 05/29/2013   ALKPHOS 53 05/29/2013   BILITOT 0.3 05/29/2013     NARRATIVE: [name] was seen today for weekly treatment management. The chart was checked and the patient's films were reviewed. The patient states that she is doing well with treatment. She does complain of some significant fatigue which really is her dominant complaint today. Several bowel movements per day, no real diarrhea. No nausea.  PHYSICAL  EXAMINATION: weight is 152 lb 1.6 oz (68.992 kg). Her temperature is 97.4 F (36.3 C). Her blood pressure is 111/72 and her pulse is 73. Her respiration is 20.        ASSESSMENT: The patient is doing satisfactorily with treatment.  PLAN: We will continue with the patient's radiation treatment as planned.

## 2013-07-01 ENCOUNTER — Ambulatory Visit
Admission: RE | Admit: 2013-07-01 | Discharge: 2013-07-01 | Disposition: A | Discharge status: Home / Self Care | Payer: 59 | Source: Ambulatory Visit | Attending: Radiation Oncology | Admitting: Radiation Oncology

## 2013-07-02 ENCOUNTER — Ambulatory Visit
Admission: RE | Admit: 2013-07-02 | Discharge: 2013-07-02 | Disposition: A | Discharge status: Home / Self Care | Payer: 59 | Source: Ambulatory Visit | Attending: Radiation Oncology | Admitting: Radiation Oncology

## 2013-07-03 ENCOUNTER — Ambulatory Visit
Admission: RE | Admit: 2013-07-03 | Discharge: 2013-07-03 | Disposition: A | Discharge status: Home / Self Care | Payer: 59 | Source: Ambulatory Visit | Attending: Radiation Oncology | Admitting: Radiation Oncology

## 2013-07-04 ENCOUNTER — Other Ambulatory Visit (HOSPITAL_BASED_OUTPATIENT_CLINIC_OR_DEPARTMENT_OTHER): Payer: 59

## 2013-07-04 ENCOUNTER — Ambulatory Visit
Admission: RE | Admit: 2013-07-04 | Discharge: 2013-07-04 | Disposition: A | Discharge status: Home / Self Care | Payer: 59 | Source: Ambulatory Visit | Attending: Radiation Oncology | Admitting: Radiation Oncology

## 2013-07-04 ENCOUNTER — Ambulatory Visit (HOSPITAL_BASED_OUTPATIENT_CLINIC_OR_DEPARTMENT_OTHER): Payer: 59 | Admitting: Nurse Practitioner

## 2013-07-04 VITALS — BP 120/72 | HR 80 | Temp 97.8°F | Resp 18 | Ht 64.0 in | Wt 148.6 lb

## 2013-07-04 DIAGNOSIS — K5989 Other specified functional intestinal disorders: Secondary | ICD-10-CM

## 2013-07-04 DIAGNOSIS — K598 Other specified functional intestinal disorders: Secondary | ICD-10-CM

## 2013-07-04 DIAGNOSIS — C20 Malignant neoplasm of rectum: Secondary | ICD-10-CM

## 2013-07-04 DIAGNOSIS — R110 Nausea: Secondary | ICD-10-CM

## 2013-07-04 DIAGNOSIS — K7689 Other specified diseases of liver: Secondary | ICD-10-CM

## 2013-07-04 LAB — CBC WITH DIFFERENTIAL/PLATELET
BASO%: 0.7 % (ref 0.0–2.0)
BASOS ABS: 0 10*3/uL (ref 0.0–0.1)
EOS%: 5.3 % (ref 0.0–7.0)
Eosinophils Absolute: 0.2 10*3/uL (ref 0.0–0.5)
HCT: 44.5 % (ref 34.8–46.6)
HGB: 15.1 g/dL (ref 11.6–15.9)
LYMPH#: 0.3 10*3/uL — AB (ref 0.9–3.3)
LYMPH%: 8.5 % — ABNORMAL LOW (ref 14.0–49.7)
MCH: 31.3 pg (ref 25.1–34.0)
MCHC: 33.9 g/dL (ref 31.5–36.0)
MCV: 92.4 fL (ref 79.5–101.0)
MONO#: 0.3 10*3/uL (ref 0.1–0.9)
MONO%: 7.9 % (ref 0.0–14.0)
NEUT%: 77.6 % — ABNORMAL HIGH (ref 38.4–76.8)
NEUTROS ABS: 3.2 10*3/uL (ref 1.5–6.5)
Platelets: 172 10*3/uL (ref 145–400)
RBC: 4.82 10*6/uL (ref 3.70–5.45)
RDW: 11.9 % (ref 11.2–14.5)
WBC: 4.1 10*3/uL (ref 3.9–10.3)

## 2013-07-04 MED ORDER — HYDROCODONE-ACETAMINOPHEN 5-325 MG PO TABS: 1.0000 | ORAL_TABLET | Freq: Four times a day (QID) | ORAL | Status: DC | PRN

## 2013-07-04 MED ORDER — PROCHLORPERAZINE MALEATE 10 MG PO TABS: 10.0000 mg | ORAL_TABLET | Freq: Four times a day (QID) | ORAL | Status: DC | PRN

## 2013-07-04 NOTE — Progress Notes (Signed)
OFFICE PROGRESS NOTE  Interval history:  [name] returns for followup of recently diagnosed rectal cancer. She began radiation and concurrent Xeloda on 06/18/2013. She is having intermittent nausea. No vomiting. She denies mouth sores. She is having frequent bowel movements. Stools are periodically loose. She notes a mucousy bloody discharge. She has pain with bowel movements. Abdominal pain has resolved. She denies skin rash.   Objective: Filed Vitals:   07/04/13 1340  BP: 120/72  Pulse: 80  Temp: 97.8 F (36.6 C)  Resp: 18   Oropharynx is without thrush or ulceration. Lungs are clear. Regular cardiac rhythm. Abdomen is soft, nontender. No hepatomegaly. Nondistended. No leg edema. No erythema or skin breakdown at the perineum.   Lab Results: Lab Results  Component Value Date   WBC 4.1 07/04/2013   HGB 15.1 07/04/2013   HCT 44.5 07/04/2013   MCV 92.4 07/04/2013   PLT 172 07/04/2013   NEUTROABS 3.2 07/04/2013    Chemistry:    Chemistry      Component Value Date/Time   NA 140 05/29/2013 1157   K 4.5 05/29/2013 1157   CL 102 05/29/2013 1157   CO2 29 05/29/2013 1157   BUN 7 05/29/2013 1157   CREATININE 0.73 05/29/2013 1157   CREATININE 0.77 06/06/2008 0730      Component Value Date/Time   CALCIUM 9.7 05/29/2013 1157   ALKPHOS 53 05/29/2013 1157   AST 17 05/29/2013 1157   ALT 14 05/29/2013 1157   BILITOT 0.3 05/29/2013 1157       Studies/Results: Ct Chest Wo Contrast  06/11/2013   CLINICAL DATA:  Cough; rectal carcinoma  EXAM: CT CHEST WITHOUT CONTRAST  TECHNIQUE: Multidetector CT imaging of the chest was performed following the standard protocol without IV contrast. Intravenous contrast was not administered per request of referring physician.  COMPARISON:  CT abdomen and pelvis May 28, 2013  FINDINGS: There is patchy atelectasis in the lung bases. Lungs are otherwise clear. There is no demonstrable parenchymal lung mass or focal interstitial thickening/fibrosis.  No ground-glass type appearance seen.  There is no demonstrable thoracic adenopathy. Pericardium is not thickened.  In the visualized upper abdomen, there is extensive pneumoperitoneum. Question recent surgery as etiology for this finding. There is incomplete visualization of what may be a parapelvic cyst in the mid left kidney. This visualize parapelvic cystic structure measures 1.7 x 1.7 cm. Visualized upper abdominal structures otherwise appear normal on this noncontrast enhanced study.  There are no blastic or lytic bone lesions.  Thyroid appears normal.  IMPRESSION: Extensive pneumoperitoneum. Question with the patient has had recent surgery given history. If the patient has not had recent surgery, perforated viscus must be of concern. Note that the amount of pneumoperitoneum appear similar to that seen on May 28, 2013.  Incomplete visualization of what probably represents a left renal parapelvic cyst.  Patchy bibasilar atelectasis. No parenchymal lung mass or ground-glass type opacity. No thoracic adenopathy appreciable.  Critical Value/emergent results were called by telephone at the time of interpretation on 06/11/2013 at 9:23 AM to Dr. Betsy Coder , who verbally acknowledged these results.   Electronically Signed   By: Lowella Grip M.D.   On: 06/11/2013 09:29   Mr Abdomen W Wo Contrast  06/18/2013   CLINICAL DATA:  History of rectal cancer.  Evaluate liver lesions.  EXAM: MRI ABDOMEN WITHOUT AND WITH CONTRAST  TECHNIQUE: Multiplanar multisequence MR imaging of the abdomen was performed both before and after the administration of intravenous contrast.  CONTRAST:  76mL MULTIHANCE GADOBENATE DIMEGLUMINE 529 MG/ML IV SOLN  COMPARISON:  CT of abdomen and pelvis 05/28/2013.  FINDINGS: There are 2 tiny liver lesions noted on today's examination. One of these is in the periphery of segment 8 measuring only 4 mm (image 12 of series 3), while the other is in segment 4A measuring 6 mm (image 18 of  series 3). These lesions are both low signal intensity on T1 weighted images, slightly high signal intensity on T2 weighted images and appear hypovascular on post-contrast images (the lesion in segment 8 demonstrates no evidence of internal enhancement, while the lesion in segment 4A is difficult to accurately assess secondary to respiratory motion, but is not favored to enhance). No other suspicious hepatic lesions are noted at this time.  Large amount of susceptibility artifact in the nondependent portion of the peritoneal cavity is most compatible with residual large volume of pneumoperitoneum. The appearance of the gallbladder, pancreas, spleen, bilateral adrenal glands and the right kidney is unremarkable. Multiple lesions in the left renal pelvis demonstrate low signal intensity on T1 weighted images, high signal intensity on T2 weighted images, and do not enhance, compatible with parapelvic cysts.  IMPRESSION: 1. The lesions of question in the liver have indeterminate imaging characteristics, largely based on their small size and partial obscuration of one of the lesions by motion related artifact on post-constrast imaging. However, these are favored to be benign lesions, likely small cysts. These could be followed with a repeat MRI of the abdomen in 6 months to ensure stability if clinically indicated. 2. Large volume of residual pneumoperitoneum. 3. Multiple left renal parapelvic cysts incidentally noted. These results were called by telephone at the time of interpretation on 06/18/2013 at 9:48 AM to Dr. Betsy Coder, who verbally acknowledged these results.   Electronically Signed   By: Vinnie Langton M.D.   On: 06/18/2013 09:50    Medications: I have reviewed the patient's current medications.  Assessment/Plan: 1. Rectal cancer, clinical stage III (T3 N1) status post biopsy of a rectal mass on 05/24/2013 confirming adenocarcinoma. Endoscopic ultrasound 05/31/2013 measured the mass at 11 cm from  the anal verge, uT3uN1. Initiation of radiation and concurrent Xeloda 06/18/2013. 2.  Post colonoscopy bowel perforation confirmed on abdomen CT 05/28/2013. 3. Single indeterminate liver lesion on the abdomen CT 05/28/2013.  4. History of cervical and lumbar disc disease.  5. History of HELLP syndrome with first pregnancy.  6. Intermittent nausea.  7. Pain with bowel movements.    Dispositon- she appears stable. Overall she appears to be tolerating the Xeloda well. She is having some mild intermittent nausea. We gave her a prescription for Compazine 10 mg every 6 hours as needed. For the pain with bowel movements she was given a prescription for hydrocodone 1 tablet every 6 hours as needed. She understands to contact the office if these measures are not effective for her symptoms.   She will return for a followup visit in approximately 2 weeks.   Ned Card ANP/GNP-BC

## 2013-07-05 ENCOUNTER — Ambulatory Visit
Admission: RE | Admit: 2013-07-05 | Discharge: 2013-07-05 | Disposition: A | Discharge status: Home / Self Care | Payer: 59 | Source: Ambulatory Visit | Attending: Radiation Oncology | Admitting: Radiation Oncology

## 2013-07-05 ENCOUNTER — Telehealth: Payer: Self-pay | Admitting: Oncology

## 2013-07-05 VITALS — BP 113/76 | HR 74 | Temp 98.1°F | Ht 64.0 in | Wt 148.8 lb

## 2013-07-05 DIAGNOSIS — C20 Malignant neoplasm of rectum: Secondary | ICD-10-CM

## 2013-07-05 NOTE — Progress Notes (Signed)
   Department of Radiation Oncology  Phone:  [phone] Fax:        [phone]  Weekly Treatment Note    Name: [name] Date: 07/05/2013 MRN: [id number] DOB: 06/22/1960   Current dose: 23.4 Gy  Current fraction: 13   MEDICATIONS: Current Outpatient Prescriptions  Medication Sig Dispense Refill  . capecitabine (XELODA) 500 MG tablet Take 3 tablets (1,500 mg total) by mouth 2 (two) times daily after a meal. On days of radiation only (Mon-Fri) Dx: 154.1  168 tablet  0  . estradiol (VIVELLE-DOT) 0.05 MG/24HR Place 1 patch onto the skin 2 (two) times a week. Sundays and Wednesday      . hydrochlorothiazide (HYDRODIURIL) 25 MG tablet Take 25 mg by mouth daily.       Marland Kitchen HYDROcodone-acetaminophen (NORCO) 5-325 MG per tablet Take 1 tablet by mouth every 6 (six) hours as needed for moderate pain.  30 tablet  0  . prochlorperazine (COMPAZINE) 10 MG tablet Take 1 tablet (10 mg total) by mouth every 6 (six) hours as needed for nausea or vomiting.  30 tablet  1  . progesterone (PROMETRIUM) 200 MG capsule Take 200 mg by mouth. Every 3 months      . sertraline (ZOLOFT) 50 MG tablet Take 50 mg by mouth daily.       . TESTOSTERONE IM Inject into the muscle. Injection quarterly      . ALPRAZolam (XANAX) 0.5 MG tablet 0.5 mg at bedtime as needed.        No current facility-administered medications for this encounter.     ALLERGIES: Review of patient's allergies indicates no known allergies.   LABORATORY DATA:  Lab Results  Component Value Date   WBC 4.1 07/04/2013   HGB 15.1 07/04/2013   HCT 44.5 07/04/2013   MCV 92.4 07/04/2013   PLT 172 07/04/2013   Lab Results  Component Value Date   NA 140 05/29/2013   K 4.5 05/29/2013   CL 102 05/29/2013   CO2 29 05/29/2013   Lab Results  Component Value Date   ALT 14 05/29/2013   AST 17 05/29/2013   ALKPHOS 53 05/29/2013   BILITOT 0.3 05/29/2013     NARRATIVE: [name] was seen today for weekly treatment management. The  chart was checked and the patient's films were reviewed. The patient is having pain with bowel movements. She just started taking Percocet yesterday but has only taken this once. She has had some nausea which is well controlled with her current nausea medication. She is notice some occasional tightness in the chest a couple of times with exercise. This has resolved and has not been associated with any other symptoms.  PHYSICAL EXAMINATION: height is 5\' 4"  (1.626 m) and weight is 148 lb 12.8 oz (67.495 kg). Her temperature is 98.1 F (36.7 C). Her blood pressure is 113/76 and her pulse is 74.        ASSESSMENT: The patient is doing satisfactorily with treatment.  PLAN: We will continue with the patient's radiation treatment as planned. The patient will use her pain medicine as needed. I discussed that she will continue with sitz baths and she also continues on a and D. ointment I believe if this is soothing as she proceeds with finishing treatment.

## 2013-07-05 NOTE — Progress Notes (Signed)
[name] has had 13 fractions to her rectum.  She is rating the pain with bowel movements at a 10/10.  She started taken hydrocodone/acetaminophen for pain yesterday.  She usually has 2 stools a day.  She reports that her bladder is "sore."  She reports passing mucus/blood during the day.  She reports nausea and is using nausea.  She reports her skin is intact.  She has not started using a sitz bath  She reports pressure like someone is sitting on her chest with any activity.  Her oxygen is 97% on room air.

## 2013-07-05 NOTE — Telephone Encounter (Signed)
S/w the pt and she is aware of her 07/19/2013 appts for lab and md visit.

## 2013-07-08 ENCOUNTER — Telehealth: Payer: Self-pay | Admitting: *Deleted

## 2013-07-08 ENCOUNTER — Ambulatory Visit
Admission: RE | Admit: 2013-07-08 | Discharge: 2013-07-08 | Disposition: A | Discharge status: Home / Self Care | Payer: 59 | Source: Ambulatory Visit | Attending: Radiation Oncology | Admitting: Radiation Oncology

## 2013-07-08 NOTE — Telephone Encounter (Signed)
Having sharp intermittent back pain like pinched nerve. Has leftover cyclobenzaprine at home-asking if OK to take this? Is almost out of medication, but her husband has some also.  Per Dr. Benay Spice, [place] to take, but do not take husband's medication. Dr. Benay Spice is OK to refill her medication while she under treatment. Attempted to reach her at the [phone] she called from as well as her mobile #-no answer. Left VM at home # to call back.

## 2013-07-09 ENCOUNTER — Ambulatory Visit
Admission: RE | Admit: 2013-07-09 | Discharge: 2013-07-09 | Disposition: A | Discharge status: Home / Self Care | Payer: 59 | Source: Ambulatory Visit | Attending: Radiation Oncology | Admitting: Radiation Oncology

## 2013-07-10 ENCOUNTER — Encounter: Payer: Self-pay | Admitting: Radiation Oncology

## 2013-07-10 ENCOUNTER — Ambulatory Visit
Admission: RE | Admit: 2013-07-10 | Discharge: 2013-07-10 | Disposition: A | Discharge status: Home / Self Care | Payer: 59 | Source: Ambulatory Visit | Attending: Radiation Oncology | Admitting: Radiation Oncology

## 2013-07-11 ENCOUNTER — Ambulatory Visit
Admission: RE | Admit: 2013-07-11 | Discharge: 2013-07-11 | Disposition: A | Discharge status: Home / Self Care | Payer: 59 | Source: Ambulatory Visit | Attending: Radiation Oncology | Admitting: Radiation Oncology

## 2013-07-12 ENCOUNTER — Encounter: Payer: Self-pay | Admitting: Radiation Oncology

## 2013-07-12 ENCOUNTER — Ambulatory Visit
Admission: RE | Admit: 2013-07-12 | Discharge: 2013-07-12 | Disposition: A | Discharge status: Home / Self Care | Payer: 59 | Source: Ambulatory Visit | Attending: Radiation Oncology | Admitting: Radiation Oncology

## 2013-07-12 VITALS — BP 131/81 | HR 70 | Temp 98.2°F | Resp 20 | Wt 149.0 lb

## 2013-07-12 DIAGNOSIS — C20 Malignant neoplasm of rectum: Secondary | ICD-10-CM

## 2013-07-12 NOTE — Progress Notes (Signed)
   Department of Radiation Oncology  Phone:  [phone] Fax:        [phone]  Weekly Treatment Note    Name: [name] Date: 07/12/2013 MRN: [id number] DOB: Dec 22, 1960   Current dose: 32.4 Gy  Current fraction: 18   MEDICATIONS: Current Outpatient Prescriptions  Medication Sig Dispense Refill  . ALPRAZolam (XANAX) 0.5 MG tablet 0.5 mg at bedtime as needed.       . capecitabine (XELODA) 500 MG tablet Take 3 tablets (1,500 mg total) by mouth 2 (two) times daily after a meal. On days of radiation only (Mon-Fri) Dx: 154.1  168 tablet  0  . estradiol (VIVELLE-DOT) 0.05 MG/24HR Place 1 patch onto the skin 2 (two) times a week. Sundays and Wednesday      . hydrochlorothiazide (HYDRODIURIL) 25 MG tablet Take 25 mg by mouth daily.       Marland Kitchen HYDROcodone-acetaminophen (NORCO) 5-325 MG per tablet Take 1 tablet by mouth every 6 (six) hours as needed for moderate pain.  30 tablet  0  . prochlorperazine (COMPAZINE) 10 MG tablet Take 1 tablet (10 mg total) by mouth every 6 (six) hours as needed for nausea or vomiting.  30 tablet  1  . progesterone (PROMETRIUM) 200 MG capsule Take 200 mg by mouth. Every 3 months      . sertraline (ZOLOFT) 50 MG tablet Take 50 mg by mouth daily.       . TESTOSTERONE IM Inject into the muscle. Injection quarterly       No current facility-administered medications for this encounter.     ALLERGIES: Review of patient's allergies indicates no known allergies.   LABORATORY DATA:  Lab Results  Component Value Date   WBC 4.1 07/04/2013   HGB 15.1 07/04/2013   HCT 44.5 07/04/2013   MCV 92.4 07/04/2013   PLT 172 07/04/2013   Lab Results  Component Value Date   NA 140 05/29/2013   K 4.5 05/29/2013   CL 102 05/29/2013   CO2 29 05/29/2013   Lab Results  Component Value Date   ALT 14 05/29/2013   AST 17 05/29/2013   ALKPHOS 53 05/29/2013   BILITOT 0.3 05/29/2013     NARRATIVE: [name] was seen today for weekly treatment management. The  chart was checked and the patient's films were reviewed. The patient states that she is experiencing some soreness in the anal/rectal region. She has increased her pain medicine to 2 tablets at a time and this seems to be working much better. Appetite fair.  PHYSICAL EXAMINATION: weight is 149 lb (67.586 kg). Her oral temperature is 98.2 F (36.8 C). Her blood pressure is 131/81 and her pulse is 70. Her respiration is 20.      scan and contact.  ASSESSMENT: The patient is doing satisfactorily with treatment.  PLAN: We will continue with the patient's radiation treatment as planned.

## 2013-07-12 NOTE — Progress Notes (Signed)
Weekly rad txs, rectal 18 completed, skin intact, no redness, but c/o pain took 2 pain tabs last night, couldn't sleep,  Has a slight head ache,  Took 2 pain meds this am now pain a 2 stated, having numerous thin soft stools, appetite fair, drinking  Enough water stated, energy level fatigued "UI just take my nausea med when I take my chemo pill and that seems to help better" 2:42 PM

## 2013-07-15 ENCOUNTER — Telehealth: Payer: Self-pay | Admitting: *Deleted

## 2013-07-15 ENCOUNTER — Ambulatory Visit
Admission: RE | Admit: 2013-07-15 | Discharge: 2013-07-15 | Disposition: A | Discharge status: Home / Self Care | Payer: 59 | Source: Ambulatory Visit | Attending: Radiation Oncology | Admitting: Radiation Oncology

## 2013-07-15 DIAGNOSIS — B3731 Acute candidiasis of vulva and vagina: Secondary | ICD-10-CM

## 2013-07-15 DIAGNOSIS — B373 Candidiasis of vulva and vagina: Secondary | ICD-10-CM

## 2013-07-15 MED ORDER — FLUCONAZOLE 150 MG PO TABS: 150.0000 mg | ORAL_TABLET | Freq: Every day | ORAL | Status: DC

## 2013-07-15 NOTE — Telephone Encounter (Signed)
Thinks she has vaginal yeast infection.  Will treat with dose of Diflucan 150 mg X 1 per Ned Card, NP. If this does not help, have radiation oncology follow up-could just be side effect of treatment.  Patient notified.

## 2013-07-16 ENCOUNTER — Ambulatory Visit
Admission: RE | Admit: 2013-07-16 | Discharge: 2013-07-16 | Disposition: A | Discharge status: Home / Self Care | Payer: 59 | Source: Ambulatory Visit | Attending: Radiation Oncology | Admitting: Radiation Oncology

## 2013-07-17 ENCOUNTER — Ambulatory Visit
Admission: RE | Admit: 2013-07-17 | Discharge: 2013-07-17 | Disposition: A | Discharge status: Home / Self Care | Payer: 59 | Source: Ambulatory Visit | Attending: Radiation Oncology | Admitting: Radiation Oncology

## 2013-07-18 ENCOUNTER — Other Ambulatory Visit: Payer: Self-pay | Admitting: Oncology

## 2013-07-18 ENCOUNTER — Ambulatory Visit
Admission: RE | Admit: 2013-07-18 | Discharge: 2013-07-18 | Disposition: A | Discharge status: Home / Self Care | Payer: 59 | Source: Ambulatory Visit | Attending: Radiation Oncology | Admitting: Radiation Oncology

## 2013-07-19 ENCOUNTER — Encounter: Payer: Self-pay | Admitting: Radiation Oncology

## 2013-07-19 ENCOUNTER — Ambulatory Visit
Admission: RE | Admit: 2013-07-19 | Discharge: 2013-07-19 | Disposition: A | Discharge status: Home / Self Care | Payer: 59 | Source: Ambulatory Visit | Attending: Radiation Oncology | Admitting: Radiation Oncology

## 2013-07-19 ENCOUNTER — Ambulatory Visit (HOSPITAL_BASED_OUTPATIENT_CLINIC_OR_DEPARTMENT_OTHER): Payer: 59 | Admitting: Oncology

## 2013-07-19 ENCOUNTER — Other Ambulatory Visit (HOSPITAL_BASED_OUTPATIENT_CLINIC_OR_DEPARTMENT_OTHER): Payer: 59

## 2013-07-19 ENCOUNTER — Encounter (INDEPENDENT_AMBULATORY_CARE_PROVIDER_SITE_OTHER): Payer: Self-pay

## 2013-07-19 VITALS — BP 126/78 | HR 79 | Temp 97.4°F | Resp 20 | Ht 64.0 in | Wt 152.3 lb

## 2013-07-19 VITALS — BP 116/75 | HR 71 | Temp 98.4°F | Resp 20 | Wt 153.0 lb

## 2013-07-19 DIAGNOSIS — R110 Nausea: Secondary | ICD-10-CM

## 2013-07-19 DIAGNOSIS — C20 Malignant neoplasm of rectum: Secondary | ICD-10-CM

## 2013-07-19 LAB — CBC WITH DIFFERENTIAL/PLATELET
BASO%: 0.4 % (ref 0.0–2.0)
BASOS ABS: 0 10*3/uL (ref 0.0–0.1)
EOS ABS: 0.2 10*3/uL (ref 0.0–0.5)
EOS%: 5 % (ref 0.0–7.0)
HCT: 40.1 % (ref 34.8–46.6)
HEMOGLOBIN: 13.8 g/dL (ref 11.6–15.9)
LYMPH%: 5.3 % — ABNORMAL LOW (ref 14.0–49.7)
MCH: 32.1 pg (ref 25.1–34.0)
MCHC: 34.5 g/dL (ref 31.5–36.0)
MCV: 93 fL (ref 79.5–101.0)
MONO#: 0.4 10*3/uL (ref 0.1–0.9)
MONO%: 9.9 % (ref 0.0–14.0)
NEUT%: 79.4 % — ABNORMAL HIGH (ref 38.4–76.8)
NEUTROS ABS: 3.2 10*3/uL (ref 1.5–6.5)
Platelets: 157 10*3/uL (ref 145–400)
RBC: 4.31 10*6/uL (ref 3.70–5.45)
RDW: 12.9 % (ref 11.2–14.5)
WBC: 4 10*3/uL (ref 3.9–10.3)
lymph#: 0.2 10*3/uL — ABNORMAL LOW (ref 0.9–3.3)

## 2013-07-19 MED ORDER — HYDROCODONE-ACETAMINOPHEN 5-325 MG PO TABS: 1.0000 | ORAL_TABLET | Freq: Four times a day (QID) | ORAL | Status: DC | PRN

## 2013-07-19 NOTE — Progress Notes (Signed)
   [facility]    OFFICE PROGRESS NOTE   INTERVAL HISTORY:   She returns for scheduled followup of rectal cancer. She continues Xeloda and radiation. No mouth sores or hand/foot pain. Frequent small volume bowel movements. No dominant bloating.  Objective:  Vital signs in last 24 hours:  Blood pressure 126/78, pulse 79, temperature 97.4 F (36.3 C), temperature source Oral, resp. rate 20, height 5\' 4"  (1.626 m), weight 152 lb 4.8 oz (69.083 kg).    HEENT: No thrush or ulcers Resp: Lungs clear bilaterally Cardio: Regular rate and rhythm GI: Nondistended, no hepatomegaly, nontender Vascular: No leg edema  Skin: Mild erythema over the trunk. Palms and soles without erythema or skin breakdown. Mild erythema at the groin and upper thighs, mild erythema at the upper perineum with superficial ulceration at the lower gluteal fold    Lab Results:  Lab Results  Component Value Date   WBC 4.0 07/19/2013   HGB 13.8 07/19/2013   HCT 40.1 07/19/2013   MCV 93.0 07/19/2013   PLT 157 07/19/2013   NEUTROABS 3.2 07/19/2013      Medications: I have reviewed the patient's current medications.  Assessment/Plan: 1. Rectal cancer, clinical stage III (T3 N1) status post biopsy of a rectal mass on 05/24/2013 confirming adenocarcinoma. Endoscopic ultrasound 05/31/2013 measured the mass at 11 cm from the anal verge, uT3uN1. Initiation of radiation and concurrent Xeloda 06/18/2013. 2. Post colonoscopy bowel perforation confirmed on abdomen CT 05/28/2013. 3. Single indeterminate liver lesion on the abdomen CT 05/28/2013. MRI on 06/18/2013 confirmed 2 tiny liver lesions, indeterminate-favored to be benign cysts 4. History of cervical and lumbar disc disease.  5. History of HELLP syndrome with first pregnancy.  6. Intermittent nausea.  7. Pain with bowel movements.    Disposition:  She appears to be tolerating the Xeloda and radiation well. She is scheduled to complete treatment  07/26/2013. She will return for an office visit here in approximately one month. We will refer her back to Dr. [name] for surgery planning.   Renee Coder, MD  07/19/2013  12:21 PM

## 2013-07-19 NOTE — Progress Notes (Signed)
   Department of Radiation Oncology  Phone:  [phone] Fax:        [phone]  Weekly Treatment Note    Name: [name] Date: 07/19/2013 MRN: [id number] DOB: 1960-07-10   Current dose: 41.4 Gy  Current fraction: 23    MEDICATIONS: Current Outpatient Prescriptions  Medication Sig Dispense Refill  . ALPRAZolam (XANAX) 0.5 MG tablet 0.5 mg at bedtime as needed.       . capecitabine (XELODA) 500 MG tablet Take 3 tablets (1,500 mg total) by mouth 2 (two) times daily after a meal. On days of radiation only (Mon-Fri) Dx: 154.1  168 tablet  0  . estradiol (VIVELLE-DOT) 0.05 MG/24HR Place 1 patch onto the skin 2 (two) times a week. Sundays and Wednesday      . hydrochlorothiazide (HYDRODIURIL) 25 MG tablet Take 25 mg by mouth daily.       Marland Kitchen HYDROcodone-acetaminophen (NORCO) 5-325 MG per tablet Take 1 tablet by mouth every 6 (six) hours as needed for moderate pain.  60 tablet  0  . prochlorperazine (COMPAZINE) 10 MG tablet Take 1 tablet (10 mg total) by mouth every 6 (six) hours as needed for nausea or vomiting.  30 tablet  1  . progesterone (PROMETRIUM) 200 MG capsule Take 200 mg by mouth. Every 3 months      . sertraline (ZOLOFT) 50 MG tablet Take 50 mg by mouth daily.       . TESTOSTERONE IM Inject into the muscle. Injection quarterly       No current facility-administered medications for this encounter.     ALLERGIES: Review of patient's allergies indicates no known allergies.   LABORATORY DATA:  Lab Results  Component Value Date   WBC 4.0 07/19/2013   HGB 13.8 07/19/2013   HCT 40.1 07/19/2013   MCV 93.0 07/19/2013   PLT 157 07/19/2013   Lab Results  Component Value Date   NA 140 05/29/2013   K 4.5 05/29/2013   CL 102 05/29/2013   CO2 29 05/29/2013   Lab Results  Component Value Date   ALT 14 05/29/2013   AST 17 05/29/2013   ALKPHOS 53 05/29/2013   BILITOT 0.3 05/29/2013     NARRATIVE: Randolm Idol Malecki was seen today for weekly treatment management.  The chart was checked and the patient's films were reviewed. The patient is having continued irritation in the buttock area. Somewhat more irritated over the last week. She is having some burning in the vaginal area.  PHYSICAL EXAMINATION: weight is 153 lb (69.4 kg). Her oral temperature is 98.4 F (36.9 C). Her blood pressure is 116/75 and her pulse is 71. Her respiration is 20.        ASSESSMENT: The patient is doing satisfactorily with treatment.  PLAN: We will continue with the patient's radiation treatment as planned. The patient is doing satisfactorily. I discussed with her that her symptoms are expected from radiation treatment including some symptoms anterior to the rectum. She will finish next week.

## 2013-07-19 NOTE — Progress Notes (Signed)
Weekly rad txs rectum, 23/28, c/o burning, and diarrhea today,had diflucan med x 1 day doesn't feel like it helped, has imodium at home, doing sitz baths and that helps, using spray bottle,  Using wipes also, appetite good, energy level not good, 4 hour nap yesteray 1:44 PM

## 2013-07-19 NOTE — Progress Notes (Signed)
Met with patient to assess for needs.  She has 1 week left of treatment after today and is looking forward to it.  She has had skin irritation around treatment area which was addressed per Dr. Benay Spice.  Her main complaint is fatigue and having no energy.  She stated she is eating healthy and has had a good appetite despite nausea off and on with chemo pills.  She reports having a good support system in place.  A follow-up appointment was arranged with surgeon for week of Feb. 23 rd.  Will continue to follow as needed.

## 2013-07-19 NOTE — Progress Notes (Signed)
  Radiation Oncology         [phone] ________________________________  Name: [name] MRN: [id number]  Date: 07/10/2013  DOB: [birth year]  COMPLEX SIMULATION  NOTE  Diagnosis: rectal cancer  Narrative The patient has initially been planned to receive a course of radiation treatment to a dose of 45 gray in 25 fractions at 1.8 gray per fraction. The patient will now receive a boost to the high risk target volume for an additional 5.4 gray. This will be delivered in 3 fractions at 1.8 gray per fraction and a cone down boost technique will be utilized. To accomplish this, an additional 4 customized blocks have been designed for this purpose. A complex isodose plan is requested to ensure that the high-risk target region receives the appropriate radiation dose and that the nearby normal structures continue to be appropriately spared. [name] final total dose therefore will be 50.4 gray.   ________________________________ ------------------------------------------------  Jodelle Gross, MD, PhD

## 2013-07-22 ENCOUNTER — Telehealth: Payer: Self-pay | Admitting: *Deleted

## 2013-07-22 ENCOUNTER — Ambulatory Visit
Admission: RE | Admit: 2013-07-22 | Discharge: 2013-07-22 | Disposition: A | Discharge status: Home / Self Care | Payer: 59 | Source: Ambulatory Visit | Attending: Radiation Oncology | Admitting: Radiation Oncology

## 2013-07-22 ENCOUNTER — Telehealth: Payer: Self-pay | Admitting: Oncology

## 2013-07-22 NOTE — Telephone Encounter (Signed)
Message from pt asking if Dr. Benay Spice thinks it'll be OK for her to go on a cruise that begins 07/27/13. Completes chemoradiation 07/26/13. Pt also requesting handicap placard for her trip. Question forwarded to provider.

## 2013-07-22 NOTE — Telephone Encounter (Signed)
Called pt, Dr. Benay Spice thinks it is fine for her to go on a cruise after completion of Xeloda/ radiation. She does not meet criteria for handicap placard. Pt voiced understanding.

## 2013-07-22 NOTE — Telephone Encounter (Signed)
S/w the pt and she is aware of her appt with dr Benay Spice in march 2015.

## 2013-07-23 ENCOUNTER — Ambulatory Visit
Admission: RE | Admit: 2013-07-23 | Discharge: 2013-07-23 | Disposition: A | Discharge status: Home / Self Care | Payer: 59 | Source: Ambulatory Visit | Attending: Radiation Oncology | Admitting: Radiation Oncology

## 2013-07-24 ENCOUNTER — Ambulatory Visit
Admission: RE | Admit: 2013-07-24 | Discharge: 2013-07-24 | Disposition: A | Discharge status: Home / Self Care | Payer: 59 | Source: Ambulatory Visit | Attending: Radiation Oncology | Admitting: Radiation Oncology

## 2013-07-24 DIAGNOSIS — C20 Malignant neoplasm of rectum: Secondary | ICD-10-CM

## 2013-07-25 ENCOUNTER — Ambulatory Visit
Admission: RE | Admit: 2013-07-25 | Discharge: 2013-07-25 | Disposition: A | Discharge status: Home / Self Care | Payer: 59 | Source: Ambulatory Visit | Attending: Radiation Oncology | Admitting: Radiation Oncology

## 2013-07-25 NOTE — Progress Notes (Signed)
  Radiation Oncology         [phone] ________________________________  Name: [name] MRN: [id number]  Date: 07/24/2013  DOB: June 13, 1961  Simulation Verification Note   NARRATIVE: The patient was brought to the treatment unit and placed in the planned treatment position for the patient's boost treatment. The clinical setup was verified. Then port films were obtained and uploaded to the radiation oncology medical record software.  The treatment beams were carefully compared against the planned radiation fields. The position, location, and shape of the radiation fields was reviewed. The targeted volume of tissue appears to be appropriately covered by the radiation beams. Based on my personal review, I approved the simulation verification. [name] treatment will proceed as planned.  ________________________________   Jodelle Gross, MD, PhD

## 2013-07-26 ENCOUNTER — Ambulatory Visit
Admission: RE | Admit: 2013-07-26 | Discharge: 2013-07-26 | Disposition: A | Discharge status: Home / Self Care | Payer: 59 | Source: Ambulatory Visit | Attending: Radiation Oncology | Admitting: Radiation Oncology

## 2013-07-26 ENCOUNTER — Encounter: Payer: Self-pay | Admitting: Radiation Oncology

## 2013-07-26 ENCOUNTER — Ambulatory Visit: Payer: 59 | Admitting: Radiation Oncology

## 2013-07-26 VITALS — BP 118/75 | HR 76 | Temp 97.7°F | Resp 20 | Wt 146.6 lb

## 2013-07-26 DIAGNOSIS — C20 Malignant neoplasm of rectum: Secondary | ICD-10-CM

## 2013-07-26 NOTE — Progress Notes (Signed)
Pt c/o rectal pain today. She continues to use water bottle, sitz baths. She states she was applying Vaseline to rectal area; advised she stop and gave her Aquafor samples. She states she has applied hemorrhoid cream as well w/good relief. She had diarrhea yesterday, took Imodium w/good relief. She states she feels better since taking Fluconazole tab, but she continues to have white vaginal discharge. She denies vaginal itching. She is fatigued. She states Hydrocodone makes her nauseated even when she takes w/food. She will discuss other pain medication w/Dr Lisbeth Renshaw. Pt completed today; gave her FU card.

## 2013-07-26 NOTE — Progress Notes (Signed)
   Department of Radiation Oncology  Phone:  [phone] Fax:        [phone]  Weekly Treatment Note    Name: [name] Date: 07/26/2013 MRN: [id number] DOB: [birth year]   Current dose: 50.4 Gy  Current fraction: 28   MEDICATIONS: Current Outpatient Prescriptions  Medication Sig Dispense Refill  . ALPRAZolam (XANAX) 0.5 MG tablet 0.5 mg at bedtime as needed.       . capecitabine (XELODA) 500 MG tablet Take 3 tablets (1,500 mg total) by mouth 2 (two) times daily after a meal. On days of radiation only (Mon-Fri) Dx: 154.1  168 tablet  0  . estradiol (VIVELLE-DOT) 0.05 MG/24HR Place 1 patch onto the skin 2 (two) times a week. Sundays and Wednesday      . hydrochlorothiazide (HYDRODIURIL) 25 MG tablet Take 25 mg by mouth daily.       Marland Kitchen HYDROcodone-acetaminophen (NORCO) 5-325 MG per tablet Take 1 tablet by mouth every 6 (six) hours as needed for moderate pain.  60 tablet  0  . prochlorperazine (COMPAZINE) 10 MG tablet Take 1 tablet (10 mg total) by mouth every 6 (six) hours as needed for nausea or vomiting.  30 tablet  1  . progesterone (PROMETRIUM) 200 MG capsule Take 200 mg by mouth. Every 3 months      . sertraline (ZOLOFT) 50 MG tablet Take 50 mg by mouth daily.       . TESTOSTERONE IM Inject into the muscle. Injection quarterly       No current facility-administered medications for this encounter.     ALLERGIES: Review of patient's allergies indicates no known allergies.   LABORATORY DATA:  Lab Results  Component Value Date   WBC 4.0 07/19/2013   HGB 13.8 07/19/2013   HCT 40.1 07/19/2013   MCV 93.0 07/19/2013   PLT 157 07/19/2013   Lab Results  Component Value Date   NA 140 05/29/2013   K 4.5 05/29/2013   CL 102 05/29/2013   CO2 29 05/29/2013   Lab Results  Component Value Date   ALT 14 05/29/2013   AST 17 05/29/2013   ALKPHOS 53 05/29/2013   BILITOT 0.3 05/29/2013     NARRATIVE: [name] was seen today for weekly treatment management. The  chart was checked and the patient's films were reviewed. The patient finished her final fraction today. Continued irritation in the anal region. He feels better after taking fluconazole. Some diarrhea with good relief with Imodium.  PHYSICAL EXAMINATION: weight is 146 lb 9.6 oz (66.497 kg). Her oral temperature is 97.7 F (36.5 C). Her blood pressure is 118/75 and her pulse is 76. Her respiration is 20.      the patient's skin externally actually looks very good. No significant bright erythema or skin breakdown. Radiation effect present but moderate.  ASSESSMENT: The patient did satisfactorily with treatment.  PLAN: Followup in one month.

## 2013-07-31 NOTE — Progress Notes (Signed)
  Radiation Oncology         [phone] ________________________________  Name: [name] MRN: [id number]  Date: 07/26/2013  DOB: 1960/12/16  End of Treatment Note  Diagnosis:   Rectal cancer     Indication for treatment:  curative       Radiation treatment dates:   06/18/2013 through 07/26/2013  Site/dose:   The patient was treated to the pelvis including the gross tumor volume and at risk nodal volumes to a dose of 45 gray at 1.8 gray per fraction. This was accomplished using a 4 field 3-D conformal technique. The patient then received a boost to the tumor and adjacent high-risk regions for an additional 5.4 gray at 1.8 gray per fraction. This was carried out using a coned-down 4 field approach. [name] total dose was 50.4 gray.  Narrative: The patient tolerated radiation treatment relatively well.   The patient had some skin irritation as well as rectal/urinary irritation at the end of treatment. Externally the skin held up well.  Plan: The patient has completed radiation treatment. The patient will return to radiation oncology clinic for routine followup in one month. I advised the patient to call or return sooner if they have any questions or concerns related to their recovery or treatment. ________________________________  Jodelle Gross, M.D., Ph.D.

## 2013-08-13 ENCOUNTER — Encounter: Payer: Self-pay | Admitting: Oncology

## 2013-08-13 NOTE — Progress Notes (Signed)
Put disability form on nurse's desk. °

## 2013-08-14 ENCOUNTER — Ambulatory Visit (INDEPENDENT_AMBULATORY_CARE_PROVIDER_SITE_OTHER): Payer: 59 | Admitting: Surgery

## 2013-08-14 ENCOUNTER — Encounter (INDEPENDENT_AMBULATORY_CARE_PROVIDER_SITE_OTHER): Payer: Self-pay | Admitting: Surgery

## 2013-08-14 VITALS — BP 124/64 | HR 72 | Temp 98.0°F | Resp 18 | Ht 65.0 in | Wt 149.0 lb

## 2013-08-14 DIAGNOSIS — C20 Malignant neoplasm of rectum: Secondary | ICD-10-CM

## 2013-08-14 NOTE — Progress Notes (Addendum)
Re:   [name] DOB:   Feb 03, 1961 MRN:   [id number]  ASSESSMENT AND PLAN: 1.  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  CT scan suggests enlarged lymph nodes.  Completed neoadjuvant chemo/radiation.  She received radiation therapy from [name] - 06/18/2013 - 07/26/2013.  She has seen [name] who gave her Xeloda.  She is now ready to proceed with surgery.   I reviewed with the patient the findings and need for colon surgery.  II discussed the role of laparoscopic and open surgery in colon surgery.  I reviewed the risks of surgery, including, but not limited to, infection, bleeding, nerve injury, anastomotic leaks, and possibility of colostomy.  I discussed that most likely, I will give her a loop ileostomy as a protective measure for the anastomosis.  I went over the preoperative mechanical and antibiotic bowel prep for colon surgery and provided prescriptions for these.  I provided the patient literature on colon surgery.  I tried to answer all questions for the patient.  3.  DJD  Has seen [name] 4.  Anxiety 5.  IUD - stuck  Sees [name]  I will contact him about the date of surgery for possible removal at the same time. 6.  Indeterminate 6 mm liver lesion seen on CT scan 7.  History of HELLP syndrome with first pregnancy  [8.  According to her husband, she does hear well without her hearing aids.]   Chief Complaint  Patient presents with  . Routine Post Op    rectal ca   REFERRING PHYSICIAN:  Dr. Stephens Hebert  HISTORY OF PRESENT ILLNESS: [name] is a 53 y.o. (DOB: 1961/04/27)  white  female whose primary care physician is Renee RONALD, MD and comes to me today for follow up of rectal cancer.   Husband [name] is with patient. Her CEA - 3.2 - 08/14/2013  She went [place] right after the completion of radiation tx.  She  is doing well and in good spirits.  They just went to see [facility] [place] [facility] [place] the other night. We reviewed the findings and treatment so far. I discussed the surgery, the time in the hospital, the idea of the protective ileostomy, and the time for recovery. We talked some about the pneumoperitoneum post colonoscopy and how long it took to get over.  History of Colon Cancer: [name] has had no prior GI problems, though she has had rectal bleeding on and off for years.  She has attributed this to hemorrhoids and it has never bothered her.  Though over the last 2 months or so, she has noticed more blood than normal.  She has also felt more bloated, though she is not sure that her bowel movements have changed.  She has had no prior abdominal surgery.  She has no gastric, liver, or pancreatic disease.  No one in her family has had colon cancer.  In part because she is 52 and in part because of the blood in her stool, [name] had a colonoscopy by [name] on 05/24/2013.  She was found to have a tumor at 12 - 14 cm from the anal verge.  Biopsies (Accession:  [id number]) showed invasive adenocarcinoma arising in a tubulovillous adenoma.  She also had tubular adenomas of the cecum and descending colon.  She underwent CT abd/pelvis, 05/28/2013 - 1. Large amount of free intraperitoneal air likely due to a perforation during the colonoscopy that sealed off. No leaking oral contrast, free fluid or abscess. 2. Asymmetric wall thickening involving the rectosigmoid colon likely colon cancer. There are enlarged lymph nodes in the mesocolon highly suspicious for metastatic disease. 3. Indeterminate 6 mm liver lesion on image number 26.   Past Medical History  Diagnosis Date  . No pertinent past medical history   . Arthritis   . Colon cancer     Past Surgical History  Procedure Laterality Date  . Back surgery    . Cesarean section      x2  . Dand c    . Joint replacement      lt knee  . Cervical  disc arthroplasty  01/03/2012    Procedure: CERVICAL ANTERIOR DISC ARTHROPLASTY;  Surgeon: Renee Pitter, MD;  Location: [facility];  Service: Neurosurgery;  Laterality: N/A;  Cervical Anterior Disc Arthorplasty Five-Six  . Breast surgery    . Eus N/A 05/31/2013    Procedure: LOWER ENDOSCOPIC ULTRASOUND (EUS);  Surgeon: Renee Beams, MD;  Location: [facility];  Service: Endoscopy;  Laterality: N/A;  . Breat reduction    . Intrauterine device insertion  2006    [facility]      Current Outpatient Prescriptions  Medication Sig Dispense Refill  . ALPRAZolam (XANAX) 0.5 MG tablet 0.5 mg at bedtime as needed.       . capecitabine (XELODA) 500 MG tablet Take 3 tablets (1,500 mg total) by mouth 2 (two) times daily after a meal. On days of radiation only (Mon-Fri) Dx: 154.1  168 tablet  0  . estradiol (VIVELLE-DOT) 0.05 MG/24HR Place 1 patch onto the skin 2 (two) times a week. Sundays and Wednesday      . hydrochlorothiazide (HYDRODIURIL) 25 MG tablet Take 25 mg by mouth daily.       . progesterone (PROMETRIUM) 200 MG capsule Take 200 mg by mouth. Every 3 months      . sertraline (ZOLOFT) 50 MG tablet Take 50 mg by mouth daily.       . TESTOSTERONE IM Inject into the muscle. Injection quarterly      . HYDROcodone-acetaminophen (NORCO) 5-325 MG per tablet Take 1 tablet by mouth every 6 (six) hours as needed for moderate pain.  60 tablet  0  . prochlorperazine (COMPAZINE) 10 MG tablet Take 1 tablet (10 mg total) by mouth every 6 (six) hours as needed for nausea or vomiting.  30 tablet  1   No current facility-administered medications for this visit.     No Known Allergies  REVIEW OF SYSTEMS: Skin:  No history of rash.  No history of abnormal moles. Infection:  No history of hepatitis or HIV.  No history of MRSA. Neurologic:  No history of stroke.  No history of seizure.  No history of headaches. Cardiac:  No history of hypertension. No history of heart disease.  No history of prior cardiac  catheterization.  No history of seeing a cardiologist. Pulmonary:  Does not smoke cigarettes.  No asthma or bronchitis.  No OSA/CPAP. Breasts:  History of bilateral reduction mammoplasty  Endocrine:  No diabetes. No thyroid disease. Gastrointestinal:  See HPI. Urologic:  No history of kidney stones.  No history of bladder infections. GYN:  Has IUD (Chrisney) stuck in uterus.  This was placed in 2006.  If she needs surgery, she wants this removed. Musculoskeletal:  Dr.  R. Giofre back surgery - 2011.  Dr. Sherrian Divers - C5-6 disk surgery - 01/03/2012.  She still has back disease, but this seems to be doing better.  She has a little achiness to her arms, which is unexplained at this time. Hematologic:  No bleeding disorder.  No history of anemia.  Not anticoagulated. Psycho-social:  The patient is oriented.   She has been under a lot of stress.  SOCIAL and FAMILY HISTORY: Married.  Husband Denice Paradise is with patient. (2nd marriage x 7 years) (Her first husband is dead, but she described him as a "shit") Her second husband had a daughter that went through treatment for a brain tumor, so he has some insight into the treatment of cancer. She works at [employer] as a Designer, television/film set. She has a son who is bipolar/anorexic/metally compromised  PHYSICAL EXAM: BP 124/64  Pulse 72  Temp(Src) 98 F (36.7 C)  Resp 18  Ht 5\' 5"  (1.651 m)  Wt 149 lb (67.586 kg)  BMI 24.79 kg/m2  General: WN WF who is alert and generally healthy appearing.  HEENT: Normal. Pupils equal. Neck: Supple. No mass.  No thyroid mass.  Scar at base of right neck from cervical disk surgery. Lymph Nodes:  No supraclavicular or cervical nodes. Lungs: Clear to auscultation and symmetric breath sounds. Heart:  RRR. No murmur or rub. Abdomen: Soft. No mass. No hernia. Normal bowel sounds.  No abdominal scars.   Extremities:  Good strength and ROM  in upper and lower extremities. Neurologic:  Grossly intact to motor and sensory  function. Psychiatric: Has normal mood and affect. Behavior is normal.   DATA REVIEWED: Epic notes and treatment so far.  [name], MD,  [facility], [address]  [place], [place]    [place] Phone:  [phone] FAX:  [phone]

## 2013-08-16 ENCOUNTER — Encounter: Payer: Self-pay | Admitting: Oncology

## 2013-08-16 NOTE — Progress Notes (Signed)
Faxed disability form to [employer] @ [phone]

## 2013-08-19 ENCOUNTER — Other Ambulatory Visit (HOSPITAL_BASED_OUTPATIENT_CLINIC_OR_DEPARTMENT_OTHER): Payer: 59

## 2013-08-19 ENCOUNTER — Ambulatory Visit (HOSPITAL_BASED_OUTPATIENT_CLINIC_OR_DEPARTMENT_OTHER): Payer: 59 | Admitting: Nurse Practitioner

## 2013-08-19 ENCOUNTER — Telehealth: Payer: Self-pay | Admitting: Oncology

## 2013-08-19 VITALS — BP 124/76 | HR 68 | Temp 98.3°F | Resp 18 | Ht 65.0 in | Wt 150.8 lb

## 2013-08-19 DIAGNOSIS — C20 Malignant neoplasm of rectum: Secondary | ICD-10-CM

## 2013-08-19 DIAGNOSIS — K7689 Other specified diseases of liver: Secondary | ICD-10-CM

## 2013-08-19 DIAGNOSIS — R110 Nausea: Secondary | ICD-10-CM

## 2013-08-19 LAB — CBC WITH DIFFERENTIAL/PLATELET
BASO%: 0.9 % (ref 0.0–2.0)
Basophils Absolute: 0 10*3/uL (ref 0.0–0.1)
EOS ABS: 0.2 10*3/uL (ref 0.0–0.5)
EOS%: 5.6 % (ref 0.0–7.0)
HCT: 43.7 % (ref 34.8–46.6)
HGB: 14.5 g/dL (ref 11.6–15.9)
LYMPH#: 0.3 10*3/uL — AB (ref 0.9–3.3)
LYMPH%: 7.8 % — AB (ref 14.0–49.7)
MCH: 32.2 pg (ref 25.1–34.0)
MCHC: 33.3 g/dL (ref 31.5–36.0)
MCV: 96.9 fL (ref 79.5–101.0)
MONO#: 0.4 10*3/uL (ref 0.1–0.9)
MONO%: 12.1 % (ref 0.0–14.0)
NEUT%: 73.6 % (ref 38.4–76.8)
NEUTROS ABS: 2.7 10*3/uL (ref 1.5–6.5)
PLATELETS: 229 10*3/uL (ref 145–400)
RBC: 4.51 10*6/uL (ref 3.70–5.45)
RDW: 17.9 % — ABNORMAL HIGH (ref 11.2–14.5)
WBC: 3.6 10*3/uL — AB (ref 3.9–10.3)

## 2013-08-19 LAB — COMPREHENSIVE METABOLIC PANEL (CC13)
ALBUMIN: 3.9 g/dL (ref 3.5–5.0)
ALT: 37 U/L (ref 0–55)
AST: 22 U/L (ref 5–34)
Alkaline Phosphatase: 57 U/L (ref 40–150)
Anion Gap: 6 mEq/L (ref 3–11)
BUN: 16.7 mg/dL (ref 7.0–26.0)
CHLORIDE: 109 meq/L (ref 98–109)
CO2: 26 mEq/L (ref 22–29)
Calcium: 9.4 mg/dL (ref 8.4–10.4)
Creatinine: 0.7 mg/dL (ref 0.6–1.1)
GLUCOSE: 88 mg/dL (ref 70–140)
Potassium: 3.7 mEq/L (ref 3.5–5.1)
Sodium: 141 mEq/L (ref 136–145)
Total Bilirubin: 0.49 mg/dL (ref 0.20–1.20)
Total Protein: 7 g/dL (ref 6.4–8.3)

## 2013-08-19 NOTE — Progress Notes (Signed)
OFFICE PROGRESS NOTE  Interval history:  Ms. Thursby returns for followup of rectal cancer. She has had mild nausea over the past several days. She has occasional loose stools. No rectal bleeding. She denies mouth sores.  Objective: Filed Vitals:   08/19/13 1040  BP: 124/76  Pulse: 68  Temp: 98.3 F (36.8 C)  Resp: 18   Oropharynx is without thrush or ulceration. Lungs clear. Regular cardiac rhythm. Abdomen soft and nontender. No hepatomegaly. No leg edema. Mild erythema over the trunk. Small cherry angioma-like lesions scattered over the abdominal wall. Palms and soles without erythema or skin breakdown. No erythema or skin breakdown at the perineum.   Lab Results: Lab Results  Component Value Date   WBC 3.6* 08/19/2013   HGB 14.5 08/19/2013   HCT 43.7 08/19/2013   MCV 96.9 08/19/2013   PLT 229 08/19/2013   NEUTROABS 2.7 08/19/2013    Chemistry:    Chemistry      Component Value Date/Time   NA 141 08/19/2013 1026   NA 140 05/29/2013 1157   K 3.7 08/19/2013 1026   K 4.5 05/29/2013 1157   CL 102 05/29/2013 1157   CO2 26 08/19/2013 1026   CO2 29 05/29/2013 1157   BUN 16.7 08/19/2013 1026   BUN 7 05/29/2013 1157   CREATININE 0.7 08/19/2013 1026   CREATININE 0.73 05/29/2013 1157   CREATININE 0.77 06/06/2008 0730      Component Value Date/Time   CALCIUM 9.4 08/19/2013 1026   CALCIUM 9.7 05/29/2013 1157   ALKPHOS 57 08/19/2013 1026   ALKPHOS 53 05/29/2013 1157   AST 22 08/19/2013 1026   AST 17 05/29/2013 1157   ALT 37 08/19/2013 1026   ALT 14 05/29/2013 1157   BILITOT 0.49 08/19/2013 1026   BILITOT 0.3 05/29/2013 1157       Studies/Results: No results found.  Medications: I have reviewed the patient's current medications.  Assessment/Plan: 1. Rectal cancer, clinical stage III (T3 N1) status post biopsy of a rectal mass on 05/24/2013 confirming adenocarcinoma. Endoscopic ultrasound 05/31/2013 measured the mass at 11 cm from the anal verge, uT3uN1. Initiation of radiation and concurrent  Xeloda 06/18/2013; completion 07/26/2013. 2. Post colonoscopy bowel perforation confirmed on abdomen CT 05/28/2013. 3. Single indeterminate liver lesion on the abdomen CT 05/28/2013. MRI on 06/18/2013 confirmed 2 tiny liver lesions, indeterminate-favored to be benign cysts 4. History of cervical and lumbar disc disease.  5. History of HELLP syndrome with first pregnancy.  6. Intermittent nausea.  7. Pain with bowel movements. Improved.  Dispositon-she appears stable. Surgery is scheduled on 09/02/2013. She will return for a followup visit on 09/17/2013 to review the pathology and discuss any additional treatment. She will contact the office in the interim with any problems.  Plan reviewed with Dr. Benay Spice.   Ned Card ANP/GNP-BC

## 2013-08-19 NOTE — [facility] Oncology Navigator Note (Signed)
Met with patient during office visit.  She is ready to have her surgery on 09/02/13.  She voiced anxiety re: upcoming surgery and the unknown of whether or not she will have a temporary ostomy.  She stated she is eating very well and denied need to see dietician for further education.  She declined offer to refer to SW or counselor.  She expressed appreciation for the visit and stated she would call if barriers to care arise.

## 2013-08-19 NOTE — Telephone Encounter (Signed)
Gave pt appt for Md on 3/31

## 2013-08-20 NOTE — Progress Notes (Signed)
Surgery scheduled for 09/02/13.  Preop on 3/10 at 1100am.  Need orders in EPIC.  Thank You.

## 2013-08-22 ENCOUNTER — Encounter (HOSPITAL_COMMUNITY): Payer: Self-pay | Admitting: Pharmacy Technician

## 2013-08-22 ENCOUNTER — Other Ambulatory Visit (INDEPENDENT_AMBULATORY_CARE_PROVIDER_SITE_OTHER): Payer: Self-pay | Admitting: Surgery

## 2013-08-26 ENCOUNTER — Encounter: Payer: Self-pay | Admitting: Radiation Oncology

## 2013-08-26 NOTE — Progress Notes (Signed)
08-26-13 [place] made aware PAT appointment 11A -08-27-13 [facility].

## 2013-08-27 ENCOUNTER — Encounter (HOSPITAL_COMMUNITY): Payer: Self-pay

## 2013-08-27 ENCOUNTER — Encounter (HOSPITAL_COMMUNITY)
Admission: RE | Admit: 2013-08-27 | Discharge: 2013-08-27 | Disposition: A | Discharge status: Home / Self Care | Payer: 59 | Source: Ambulatory Visit | Attending: Surgery | Admitting: Surgery

## 2013-08-27 DIAGNOSIS — M1990 Unspecified osteoarthritis, unspecified site: Secondary | ICD-10-CM

## 2013-08-27 DIAGNOSIS — C189 Malignant neoplasm of colon, unspecified: Secondary | ICD-10-CM

## 2013-08-27 DIAGNOSIS — Z975 Presence of (intrauterine) contraceptive device: Secondary | ICD-10-CM

## 2013-08-27 DIAGNOSIS — Z01812 Encounter for preprocedural laboratory examination: Secondary | ICD-10-CM | POA: Insufficient documentation

## 2013-08-27 DIAGNOSIS — Z01818 Encounter for other preprocedural examination: Secondary | ICD-10-CM | POA: Insufficient documentation

## 2013-08-27 HISTORY — DX: Unspecified osteoarthritis, unspecified site: M19.90

## 2013-08-27 HISTORY — PX: KNEE ARTHROSCOPY: SUR90

## 2013-08-27 HISTORY — DX: Presence of (intrauterine) contraceptive device: Z97.5

## 2013-08-27 HISTORY — DX: Malignant neoplasm of colon, unspecified: C18.9

## 2013-08-27 NOTE — Consult Note (Signed)
WOC wound consult note  Stoma type/location: Patient is having Ileostomy placed next week, per patient.  Education provided:  Patient and spouse present.  Explained the placement of the ileostomy.  That the effluent would be small bowel contents.  Discussed  Self care, pouch emptying and obtaining supplies.  Answered questions and offered emotional support regarding lifestyle going forward.  Recommended site marked in RLQ 4 cm left of umbilicus and 4 cm down, within the rectus muscle.  Patient and spouse are encouraged and ready to move forward.  [facility] nursing team will continue to be available to this patient.  [name] RN BSN [place] Pager [phone]

## 2013-08-27 NOTE — Patient Instructions (Addendum)
[place]  08/27/2013   Your procedure is scheduled on:  3-10 -2015  Report to [facility] at        [place] [facility].  Call this number if you have problems the morning of surgery: [phone]  Or Presurgical Testing [phone]([name]) For Living Will and/or Health Care Power Attorney Forms: please provide copy for your medical record,may bring AM of surgery(Forms should be already notarized -we do not provide this service).(08-27-13 Patient given advance directive documents).  Remember: Follow any bowel prep instructions per MD office.    Do not eat food:After Midnight.  Take these medicines the morning of surgery with A SIP OF WATER: Hydrocodone. Sertraline.   Do not wear jewelry, make-up or nail polish.  Do not wear lotions, powders, or perfumes. You may wear deodorant.  Do not shave 48 hours(2 days) prior to first CHG shower(legs and under arms).(Shaving face and neck okay.)  Do not bring valuables to the hospital.(Hospital is not responsible for lost valuables).  Contacts, dentures or removable bridgework, body piercing, hair pins may not be worn into surgery.  Leave suitcase in the car. After surgery it may be brought to your room.  For patients admitted to the hospital, checkout time is 11:00 AM the day of discharge.(Restricted visitors-Any Persons displaying flu-like symptoms or illness).    Patients discharged the day of surgery will not be allowed to drive home. Must have responsible person with you x 24 hours once discharged.  Name and phone number of your driverRichard-spouse [phone] cell:   Special Instructions: CHG(Chlorhedine 4%-"Hibiclens","Betasept","Aplicare") Shower Use Special Wash: see special instructions.(avoid face and genitals)   Please read over the following fact sheets that you were given:  Blood Transfusion fact sheet, Incentive Spirometry Instruction.  Remember : Type/Screen "Blue armbands" - may not be removed once applied(would  result in being retested AM of surgery, if removed).  Failure to follow these instructions may result in Cancellation of your surgery.   Patient signature_______________________________________________________

## 2013-08-27 NOTE — Pre-Procedure Instructions (Addendum)
08-27-13 CBC/d, CMP 08-19-13 Epic. CT Chest 12'14 Epic. 08-27-13 Ostomy RN here to mark patient and educate on possible ileostomy. W. Floy Sabina

## 2013-08-29 ENCOUNTER — Encounter: Payer: Self-pay | Admitting: Radiation Oncology

## 2013-08-29 ENCOUNTER — Ambulatory Visit
Admission: RE | Admit: 2013-08-29 | Discharge: 2013-08-29 | Disposition: A | Discharge status: Home / Self Care | Payer: 59 | Source: Ambulatory Visit | Attending: Radiation Oncology | Admitting: Radiation Oncology

## 2013-08-29 VITALS — BP 124/79 | HR 76 | Temp 98.1°F | Resp 20 | Ht 64.0 in | Wt 154.3 lb

## 2013-08-29 DIAGNOSIS — C20 Malignant neoplasm of rectum: Secondary | ICD-10-CM

## 2013-08-29 HISTORY — DX: Personal history of irradiation: Z92.3

## 2013-08-29 NOTE — Progress Notes (Signed)
  Radiation Oncology         [phone] ________________________________  Name: [name] MRN: [id number]  Date: 08/29/2013  DOB: Jan 17, 1961  Follow-Up Visit Note  CC: Maisie Fus, MD  [name], MD  Diagnosis:   Rectal cancer  Interval Since Last Radiation:  Approximately one month   Narrative:  The patient returns today for routine follow-up.  The patient states that things have gone well since the end of treatment in terms of recovery. No significant ongoing  issues in terms of dysuria, rectal irritation. No blood per rectum.                         ALLERGIES:  has No Known Allergies.  Meds: Current Outpatient Prescriptions  Medication Sig Dispense Refill  . ALPRAZolam (XANAX) 0.5 MG tablet Take 0.5 mg by mouth at bedtime as needed for anxiety or sleep.       Marland Kitchen estradiol (VIVELLE-DOT) 0.05 MG/24HR Place 1 patch onto the skin 2 (two) times a week. Sundays and Wednesday      . hydrochlorothiazide (HYDRODIURIL) 25 MG tablet Take 25 mg by mouth every morning.       Marland Kitchen HYDROcodone-acetaminophen (NORCO/VICODIN) 5-325 MG per tablet Take 1 tablet by mouth every 6 (six) hours as needed for moderate pain.      . progesterone (PROMETRIUM) 200 MG capsule Take 200 mg by mouth. Every 3 months      . sertraline (ZOLOFT) 50 MG tablet Take 50 mg by mouth every morning.       . TESTOSTERONE IM Inject into the muscle every 30 (thirty) days.       . naproxen sodium (ANAPROX) 220 MG tablet Take 220 mg by mouth 2 (two) times daily with a meal.      . prochlorperazine (COMPAZINE) 10 MG tablet Take 10 mg by mouth every 6 (six) hours as needed for nausea or vomiting.       No current facility-administered medications for this encounter.    Physical Findings: The patient is in no acute distress. Patient is alert and oriented.  height is 5\' 4"  (1.626 m) and weight is 154 lb 4.8 oz (69.99 kg). Her oral temperature is 98.1 F (36.7 C). Her blood pressure is 124/79 and her pulse is 76. Her  respiration is 20. .     Lab Findings: Lab Results  Component Value Date   WBC 3.6* 08/19/2013   HGB 14.5 08/19/2013   HCT 43.7 08/19/2013   MCV 96.9 08/19/2013   PLT 229 08/19/2013     Radiographic Findings: No results found.  Impression:    The patient is doing well approximately one month after completing preoperative chemoradiotherapy for rectal cancer. Surgery is scheduled on 09/02/2013.   Plan:  The patient will followup in 6 months.   Jodelle Gross, M.D., Ph.D.

## 2013-08-29 NOTE — Progress Notes (Signed)
Follow up rectal rad txs: 06/18/13-07/26/13, regular bowel movements, some achiness left lower abdomen"where the tumo is" stated patient, appetite good, surgery scheduled  09/02/13 with Dr. Lendon Collar PM

## 2013-09-02 ENCOUNTER — Inpatient Hospital Stay (HOSPITAL_COMMUNITY)
Admission: RE | Admit: 2013-09-02 | Discharge: 2013-09-09 | DRG: 333 | Disposition: A | Discharge status: Home / Self Care | Payer: 59 | Source: Ambulatory Visit | Attending: Surgery | Admitting: Surgery

## 2013-09-02 ENCOUNTER — Encounter (HOSPITAL_COMMUNITY)
Admission: RE | Disposition: A | Discharge status: Home / Self Care | Payer: Self-pay | Source: Ambulatory Visit | Attending: Surgery

## 2013-09-02 ENCOUNTER — Inpatient Hospital Stay (HOSPITAL_COMMUNITY): Payer: 59 | Admitting: *Deleted

## 2013-09-02 ENCOUNTER — Encounter (HOSPITAL_COMMUNITY): Payer: Self-pay | Admitting: *Deleted

## 2013-09-02 ENCOUNTER — Encounter (HOSPITAL_COMMUNITY): Payer: 59 | Admitting: *Deleted

## 2013-09-02 DIAGNOSIS — C189 Malignant neoplasm of colon, unspecified: Secondary | ICD-10-CM

## 2013-09-02 DIAGNOSIS — Z9221 Personal history of antineoplastic chemotherapy: Secondary | ICD-10-CM

## 2013-09-02 DIAGNOSIS — F411 Generalized anxiety disorder: Secondary | ICD-10-CM | POA: Diagnosis present

## 2013-09-02 DIAGNOSIS — C779 Secondary and unspecified malignant neoplasm of lymph node, unspecified: Secondary | ICD-10-CM | POA: Diagnosis present

## 2013-09-02 DIAGNOSIS — R32 Unspecified urinary incontinence: Secondary | ICD-10-CM | POA: Diagnosis present

## 2013-09-02 DIAGNOSIS — Z923 Personal history of irradiation: Secondary | ICD-10-CM

## 2013-09-02 DIAGNOSIS — M1990 Unspecified osteoarthritis, unspecified site: Secondary | ICD-10-CM | POA: Diagnosis present

## 2013-09-02 DIAGNOSIS — C20 Malignant neoplasm of rectum: Principal | ICD-10-CM | POA: Diagnosis present

## 2013-09-02 DIAGNOSIS — Z975 Presence of (intrauterine) contraceptive device: Secondary | ICD-10-CM

## 2013-09-02 DIAGNOSIS — K560 Paralytic ileus: Secondary | ICD-10-CM | POA: Diagnosis not present

## 2013-09-02 DIAGNOSIS — Z79899 Other long term (current) drug therapy: Secondary | ICD-10-CM

## 2013-09-02 DIAGNOSIS — R209 Unspecified disturbances of skin sensation: Secondary | ICD-10-CM | POA: Diagnosis not present

## 2013-09-02 HISTORY — PX: LAPAROSCOPIC LOW ANTERIOR RESECTION: SHX5904

## 2013-09-02 LAB — TYPE AND SCREEN
ABO/RH(D): O POS
ANTIBODY SCREEN: NEGATIVE

## 2013-09-02 LAB — ABO/RH: ABO/RH(D): O POS

## 2013-09-02 SURGERY — RESECTION, RECTUM, LOW ANTERIOR, LAPAROSCOPIC
Anesthesia: General | Site: Abdomen

## 2013-09-02 MED ORDER — DEXAMETHASONE SODIUM PHOSPHATE 4 MG/ML IJ SOLN
INTRAMUSCULAR | Status: DC | PRN
  Administered 2013-09-02: 10 mg via INTRAVENOUS

## 2013-09-02 MED ORDER — HYDROMORPHONE HCL PF 1 MG/ML IJ SOLN
INTRAMUSCULAR | Status: AC
  Filled 2013-09-02: qty 1

## 2013-09-02 MED ORDER — FENTANYL CITRATE 0.05 MG/ML IJ SOLN
INTRAMUSCULAR | Status: AC
  Filled 2013-09-02: qty 5

## 2013-09-02 MED ORDER — GLYCOPYRROLATE 0.2 MG/ML IJ SOLN
INTRAMUSCULAR | Status: DC | PRN
  Administered 2013-09-02: 0.6 mg via INTRAVENOUS

## 2013-09-02 MED ORDER — PROPOFOL 10 MG/ML IV BOLUS
INTRAVENOUS | Status: AC
  Filled 2013-09-02: qty 20

## 2013-09-02 MED ORDER — HYDROMORPHONE HCL PF 1 MG/ML IJ SOLN
INTRAMUSCULAR | Status: DC | PRN
  Administered 2013-09-02: 2 mg via INTRAVENOUS

## 2013-09-02 MED ORDER — MORPHINE SULFATE (PF) 1 MG/ML IV SOLN
INTRAVENOUS | Status: DC
  Administered 2013-09-02 (×2): via INTRAVENOUS
  Administered 2013-09-02: 26 mg via INTRAVENOUS
  Administered 2013-09-03: 16.4 mg via INTRAVENOUS
  Administered 2013-09-03: 21 mg via INTRAVENOUS
  Administered 2013-09-03: 4.5 mg via INTRAVENOUS
  Administered 2013-09-03: 10.1 mg via INTRAVENOUS
  Administered 2013-09-03: 10 mg via INTRAVENOUS
  Administered 2013-09-03: 23:00:00 via INTRAVENOUS
  Administered 2013-09-03: 5 mg via INTRAVENOUS
  Administered 2013-09-04: 3 mg via INTRAVENOUS
  Administered 2013-09-04: 20.86 mg via INTRAVENOUS
  Administered 2013-09-04 (×2): via INTRAVENOUS
  Administered 2013-09-04: 7.5 mg via INTRAVENOUS
  Administered 2013-09-04: 16.41 mg via INTRAVENOUS
  Administered 2013-09-05: 3 mg via INTRAVENOUS
  Administered 2013-09-05: 4.5 mg via INTRAVENOUS
  Filled 2013-09-02 (×8): qty 25

## 2013-09-02 MED ORDER — DEXTROSE 5 % IV SOLN
2.0000 g | Freq: Two times a day (BID) | INTRAVENOUS | Status: AC
  Administered 2013-09-02: 2 g via INTRAVENOUS
  Filled 2013-09-02: qty 2

## 2013-09-02 MED ORDER — NEOSTIGMINE METHYLSULFATE 1 MG/ML IJ SOLN
INTRAMUSCULAR | Status: DC | PRN
  Administered 2013-09-02: 5 mg via INTRAVENOUS

## 2013-09-02 MED ORDER — BUPIVACAINE HCL (PF) 0.5 % IJ SOLN
INTRAMUSCULAR | Status: DC | PRN
  Administered 2013-09-02: 5 mL
  Administered 2013-09-02: 25 mL

## 2013-09-02 MED ORDER — HYDROMORPHONE HCL PF 1 MG/ML IJ SOLN
0.2500 mg | INTRAMUSCULAR | Status: DC | PRN
  Administered 2013-09-02 (×2): 0.5 mg via INTRAVENOUS

## 2013-09-02 MED ORDER — ONDANSETRON HCL 4 MG/2ML IJ SOLN
INTRAMUSCULAR | Status: AC
  Filled 2013-09-02: qty 2

## 2013-09-02 MED ORDER — ERYTHROMYCIN BASE 250 MG PO TABS: 1000.0000 mg | ORAL_TABLET | ORAL | Status: DC

## 2013-09-02 MED ORDER — FENTANYL CITRATE 0.05 MG/ML IJ SOLN
INTRAMUSCULAR | Status: DC | PRN
  Administered 2013-09-02 (×3): 50 ug via INTRAVENOUS
  Administered 2013-09-02: 100 ug via INTRAVENOUS

## 2013-09-02 MED ORDER — DIPHENHYDRAMINE HCL 12.5 MG/5ML PO ELIX
12.5000 mg | ORAL_SOLUTION | Freq: Four times a day (QID) | ORAL | Status: DC | PRN
Start: 2013-09-02 — End: 2013-09-05
  Filled 2013-09-02: qty 5

## 2013-09-02 MED ORDER — ROCURONIUM BROMIDE 100 MG/10ML IV SOLN
INTRAVENOUS | Status: DC | PRN
  Administered 2013-09-02: 10 mg via INTRAVENOUS
  Administered 2013-09-02: 20 mg via INTRAVENOUS
  Administered 2013-09-02: 50 mg via INTRAVENOUS
  Administered 2013-09-02: 20 mg via INTRAVENOUS
  Administered 2013-09-02: 10 mg via INTRAVENOUS

## 2013-09-02 MED ORDER — METOCLOPRAMIDE HCL 5 MG/ML IJ SOLN
INTRAMUSCULAR | Status: DC | PRN
  Administered 2013-09-02: 10 mg via INTRAVENOUS

## 2013-09-02 MED ORDER — MIDAZOLAM HCL 2 MG/2ML IJ SOLN
INTRAMUSCULAR | Status: AC
  Filled 2013-09-02: qty 2

## 2013-09-02 MED ORDER — LACTATED RINGERS IV SOLN
INTRAVENOUS | Status: DC
  Administered 2013-09-02 (×2): via INTRAVENOUS
  Administered 2013-09-02: 1000 mL via INTRAVENOUS

## 2013-09-02 MED ORDER — HYDROCODONE-ACETAMINOPHEN 5-325 MG PO TABS
1.0000 | ORAL_TABLET | ORAL | Status: DC | PRN
  Administered 2013-09-03 – 2013-09-09 (×26): 2 via ORAL
  Filled 2013-09-02 (×26): qty 2

## 2013-09-02 MED ORDER — SODIUM CHLORIDE 0.9 % IJ SOLN: 9.0000 mL | INTRAMUSCULAR | Status: DC | PRN

## 2013-09-02 MED ORDER — ALVIMOPAN 12 MG PO CAPS
12.0000 mg | ORAL_CAPSULE | Freq: Two times a day (BID) | ORAL | Status: DC
  Administered 2013-09-03 – 2013-09-05 (×6): 12 mg via ORAL
  Filled 2013-09-02 (×8): qty 1

## 2013-09-02 MED ORDER — ROCURONIUM BROMIDE 100 MG/10ML IV SOLN
INTRAVENOUS | Status: AC
  Filled 2013-09-02: qty 1

## 2013-09-02 MED ORDER — HEPARIN SODIUM (PORCINE) 5000 UNIT/ML IJ SOLN
5000.0000 [IU] | Freq: Three times a day (TID) | INTRAMUSCULAR | Status: DC
  Administered 2013-09-02 – 2013-09-09 (×19): 5000 [IU] via SUBCUTANEOUS
  Filled 2013-09-02 (×23): qty 1

## 2013-09-02 MED ORDER — ONDANSETRON HCL 4 MG PO TABS: 4.0000 mg | ORAL_TABLET | Freq: Four times a day (QID) | ORAL | Status: DC | PRN

## 2013-09-02 MED ORDER — DEXTROSE 5 % IV SOLN
2.0000 g | INTRAVENOUS | Status: AC
  Administered 2013-09-02: 2 g via INTRAVENOUS
  Filled 2013-09-02: qty 2

## 2013-09-02 MED ORDER — MIDAZOLAM HCL 5 MG/5ML IJ SOLN
INTRAMUSCULAR | Status: DC | PRN
  Administered 2013-09-02: 2 mg via INTRAVENOUS

## 2013-09-02 MED ORDER — DEXAMETHASONE SODIUM PHOSPHATE 10 MG/ML IJ SOLN
INTRAMUSCULAR | Status: AC
  Filled 2013-09-02: qty 1

## 2013-09-02 MED ORDER — CEFOTETAN DISODIUM-DEXTROSE 2-2.08 GM-% IV SOLR
INTRAVENOUS | Status: AC
  Filled 2013-09-02: qty 50

## 2013-09-02 MED ORDER — BUPIVACAINE HCL (PF) 0.25 % IJ SOLN
INTRAMUSCULAR | Status: AC
  Filled 2013-09-02: qty 30

## 2013-09-02 MED ORDER — NEOSTIGMINE METHYLSULFATE 1 MG/ML IJ SOLN
INTRAMUSCULAR | Status: AC
  Filled 2013-09-02: qty 10

## 2013-09-02 MED ORDER — HYDROMORPHONE HCL PF 2 MG/ML IJ SOLN
INTRAMUSCULAR | Status: AC
  Filled 2013-09-02: qty 1

## 2013-09-02 MED ORDER — KCL IN DEXTROSE-NACL 20-5-0.45 MEQ/L-%-% IV SOLN
INTRAVENOUS | Status: DC
  Administered 2013-09-02 – 2013-09-06 (×11): via INTRAVENOUS
  Filled 2013-09-02 (×16): qty 1000

## 2013-09-02 MED ORDER — PEG 3350-KCL-NA BICARB-NACL 420 G PO SOLR: 4000.0000 mL | Freq: Once | ORAL | Status: DC

## 2013-09-02 MED ORDER — NALOXONE HCL 0.4 MG/ML IJ SOLN: 0.4000 mg | INTRAMUSCULAR | Status: DC | PRN

## 2013-09-02 MED ORDER — METOCLOPRAMIDE HCL 5 MG/ML IJ SOLN
INTRAMUSCULAR | Status: AC
  Filled 2013-09-02: qty 2

## 2013-09-02 MED ORDER — PHENYLEPHRINE HCL 10 MG/ML IJ SOLN
INTRAMUSCULAR | Status: DC | PRN
  Administered 2013-09-02 (×2): 80 ug via INTRAVENOUS
  Administered 2013-09-02: 40 ug via INTRAVENOUS

## 2013-09-02 MED ORDER — PROPOFOL 10 MG/ML IV BOLUS
INTRAVENOUS | Status: DC | PRN
  Administered 2013-09-02: 40 mg via INTRAVENOUS
  Administered 2013-09-02: 100 mg via INTRAVENOUS
  Administered 2013-09-02: 20 mg via INTRAVENOUS

## 2013-09-02 MED ORDER — KETAMINE HCL 10 MG/ML IJ SOLN
INTRAMUSCULAR | Status: AC
  Filled 2013-09-02: qty 1

## 2013-09-02 MED ORDER — ALVIMOPAN 12 MG PO CAPS
12.0000 mg | ORAL_CAPSULE | Freq: Once | ORAL | Status: AC
  Administered 2013-09-02: 12 mg via ORAL
  Filled 2013-09-02: qty 1

## 2013-09-02 MED ORDER — KCL IN DEXTROSE-NACL 20-5-0.45 MEQ/L-%-% IV SOLN
INTRAVENOUS | Status: AC
Start: 2013-09-02 — End: 2013-09-03
  Filled 2013-09-02: qty 1000

## 2013-09-02 MED ORDER — SCOPOLAMINE 1 MG/3DAYS TD PT72
MEDICATED_PATCH | TRANSDERMAL | Status: DC | PRN
  Administered 2013-09-02: 1 via TRANSDERMAL

## 2013-09-02 MED ORDER — MORPHINE SULFATE (PF) 1 MG/ML IV SOLN
INTRAVENOUS | Status: AC
  Filled 2013-09-02: qty 25

## 2013-09-02 MED ORDER — HEPARIN SODIUM (PORCINE) 5000 UNIT/ML IJ SOLN
5000.0000 [IU] | Freq: Once | INTRAMUSCULAR | Status: AC
  Administered 2013-09-02: 5000 [IU] via SUBCUTANEOUS
  Filled 2013-09-02: qty 1

## 2013-09-02 MED ORDER — ONDANSETRON HCL 4 MG/2ML IJ SOLN
4.0000 mg | Freq: Four times a day (QID) | INTRAMUSCULAR | Status: DC | PRN
  Administered 2013-09-02 – 2013-09-04 (×2): 4 mg via INTRAVENOUS

## 2013-09-02 MED ORDER — LACTATED RINGERS IV SOLN
INTRAVENOUS | Status: DC
Start: 2013-09-02 — End: 2013-09-02

## 2013-09-02 MED ORDER — GLYCOPYRROLATE 0.2 MG/ML IJ SOLN
INTRAMUSCULAR | Status: AC
  Filled 2013-09-02: qty 3

## 2013-09-02 MED ORDER — DIPHENHYDRAMINE HCL 50 MG/ML IJ SOLN: 12.5000 mg | Freq: Four times a day (QID) | INTRAMUSCULAR | Status: DC | PRN

## 2013-09-02 MED ORDER — ONDANSETRON HCL 4 MG/2ML IJ SOLN
4.0000 mg | Freq: Four times a day (QID) | INTRAMUSCULAR | Status: DC | PRN
  Filled 2013-09-02 (×2): qty 2

## 2013-09-02 MED ORDER — KETAMINE HCL 10 MG/ML IJ SOLN
INTRAMUSCULAR | Status: DC | PRN
  Administered 2013-09-02: 5 mg via INTRAVENOUS
  Administered 2013-09-02 (×2): 10 mg via INTRAVENOUS
  Administered 2013-09-02: 5 mg via INTRAVENOUS
  Administered 2013-09-02: 10 mg via INTRAVENOUS
  Administered 2013-09-02: 5 mg via INTRAVENOUS

## 2013-09-02 MED ORDER — BUPIVACAINE HCL (PF) 0.5 % IJ SOLN
INTRAMUSCULAR | Status: AC
  Filled 2013-09-02: qty 30

## 2013-09-02 MED ORDER — SCOPOLAMINE 1 MG/3DAYS TD PT72
MEDICATED_PATCH | TRANSDERMAL | Status: AC
  Filled 2013-09-02: qty 1

## 2013-09-02 MED ORDER — LACTATED RINGERS IR SOLN
Status: DC | PRN
  Administered 2013-09-02 (×2): 1000 mL

## 2013-09-02 MED ORDER — NEOMYCIN SULFATE 500 MG PO TABS: 1000.0000 mg | ORAL_TABLET | ORAL | Status: DC

## 2013-09-02 SURGICAL SUPPLY — 81 items
ADH SKN CLS APL DERMABOND .7 (GAUZE/BANDAGES/DRESSINGS)
APPLIER CLIP 5 13 M/L LIGAMAX5 (MISCELLANEOUS) ×3
APPLIER CLIP ROT 10 11.4 M/L (STAPLE)
APR CLP MED LRG 11.4X10 (STAPLE)
APR CLP MED LRG 5 ANG JAW (MISCELLANEOUS) ×1
BLADE EXTENDED COATED 6.5IN (ELECTRODE) IMPLANT
BLADE HEX COATED 2.75 (ELECTRODE) ×3 IMPLANT
BLADE SURG SZ10 CARB STEEL (BLADE) ×6 IMPLANT
CANISTER SUCTION 2500CC (MISCELLANEOUS) ×3 IMPLANT
CELLS DAT CNTRL 66122 CELL SVR (MISCELLANEOUS) IMPLANT
CLIP APPLIE 5 13 M/L LIGAMAX5 (MISCELLANEOUS) IMPLANT
CLIP APPLIE ROT 10 11.4 M/L (STAPLE) IMPLANT
CLOSURE WOUND 1/2 X4 (GAUZE/BANDAGES/DRESSINGS)
COVER MAYO STAND STRL (DRAPES) ×3 IMPLANT
CUTTER FLEX LINEAR 45M (STAPLE) IMPLANT
DECANTER SPIKE VIAL GLASS SM (MISCELLANEOUS) ×3 IMPLANT
DERMABOND ADVANCED (GAUZE/BANDAGES/DRESSINGS)
DERMABOND ADVANCED .7 DNX12 (GAUZE/BANDAGES/DRESSINGS) IMPLANT
DRAPE LAPAROSCOPIC ABDOMINAL (DRAPES) ×3 IMPLANT
DRAPE LG THREE QUARTER DISP (DRAPES) IMPLANT
DRAPE WARM FLUID 44X44 (DRAPE) ×3 IMPLANT
DRSG TELFA 3X8 NADH (GAUZE/BANDAGES/DRESSINGS) IMPLANT
ELECT REM PT RETURN 9FT ADLT (ELECTROSURGICAL) ×3
ELECTRODE REM PT RTRN 9FT ADLT (ELECTROSURGICAL) ×1 IMPLANT
FILTER SMOKE EVAC LAPAROSHD (FILTER) IMPLANT
GLOVE SURG SIGNA 7.5 PF LTX (GLOVE) ×8 IMPLANT
GOWN STRL REUS W/TWL LRG LVL3 (GOWN DISPOSABLE) ×1 IMPLANT
GOWN STRL REUS W/TWL XL LVL3 (GOWN DISPOSABLE) ×22 IMPLANT
KIT BASIN OR (CUSTOM PROCEDURE TRAY) ×5 IMPLANT
LEGGING LITHOTOMY PAIR STRL (DRAPES) ×2 IMPLANT
LIGASURE IMPACT 36 18CM CVD LR (INSTRUMENTS) IMPLANT
MANIFOLD NEPTUNE II (INSTRUMENTS) ×2 IMPLANT
NS IRRIG 1000ML POUR BTL (IV SOLUTION) ×3 IMPLANT
PACK GENERAL/GYN (CUSTOM PROCEDURE TRAY) ×2 IMPLANT
PAD DRESSING TELFA 3X8 NADH (GAUZE/BANDAGES/DRESSINGS) IMPLANT
PENCIL BUTTON HOLSTER BLD 10FT (ELECTRODE) ×3 IMPLANT
RELOAD 45 VASCULAR/THIN (ENDOMECHANICALS) IMPLANT
RELOAD BLUE (STAPLE) ×1 IMPLANT
RELOAD STAPLE 45 2.5 WHT GRN (ENDOMECHANICALS) IMPLANT
RELOAD STAPLE 45 3.5 BLU ETS (ENDOMECHANICALS) IMPLANT
RELOAD STAPLE TA45 3.5 REG BLU (ENDOMECHANICALS) IMPLANT
RETRACTOR WND ALEXIS 18 MED (MISCELLANEOUS) IMPLANT
RTRCTR WOUND ALEXIS 18CM MED (MISCELLANEOUS)
SCALPEL HARMONIC ACE (MISCELLANEOUS) ×2 IMPLANT
SET IRRIG TUBING LAPAROSCOPIC (IRRIGATION / IRRIGATOR) ×2 IMPLANT
SLEEVE SURGEON STRL (DRAPES) ×2 IMPLANT
SOLUTION ANTI FOG 6CC (MISCELLANEOUS) ×3 IMPLANT
SPONGE GAUZE 4X4 12PLY (GAUZE/BANDAGES/DRESSINGS) ×3 IMPLANT
SPONGE LAP 18X18 X RAY DECT (DISPOSABLE) ×6 IMPLANT
STAPLE ECHEON FLEX 60 POW ENDO (STAPLE) IMPLANT
STAPLER CIRC CVD 29MM 37CM (STAPLE) ×2 IMPLANT
STAPLER CUT CVD 40MM BLUE (STAPLE) ×2 IMPLANT
STAPLER PROXIMATE 75MM BLUE (STAPLE) ×2 IMPLANT
STAPLER VISISTAT 35W (STAPLE) ×2 IMPLANT
STRIP CLOSURE SKIN 1/2X4 (GAUZE/BANDAGES/DRESSINGS) IMPLANT
SUCTION POOLE TIP (SUCTIONS) ×5 IMPLANT
SUT MON AB 5-0 PS2 18 (SUTURE) ×3 IMPLANT
SUT NOVA NAB DX-16 0-1 5-0 T12 (SUTURE) IMPLANT
SUT PDS AB 1 CTX 36 (SUTURE) ×6 IMPLANT
SUT PROLENE 2 0 KS (SUTURE) IMPLANT
SUT PROLENE 2 0 SH DA (SUTURE) ×2 IMPLANT
SUT SILK 2 0 (SUTURE) ×3
SUT SILK 2 0 SH CR/8 (SUTURE) ×2 IMPLANT
SUT SILK 2-0 18XBRD TIE 12 (SUTURE) ×1 IMPLANT
SUT SILK 3 0 (SUTURE)
SUT SILK 3 0 SH CR/8 (SUTURE) IMPLANT
SUT SILK 3-0 18XBRD TIE 12 (SUTURE) IMPLANT
SUT VIC AB 2-0 SH 27 (SUTURE) ×6
SUT VIC AB 2-0 SH 27X BRD (SUTURE) IMPLANT
SYR BULB IRRIGATION 50ML (SYRINGE) ×3 IMPLANT
TOWEL OR 17X26 10 PK STRL BLUE (TOWEL DISPOSABLE) ×3 IMPLANT
TRAY FOLEY CATH 14FRSI W/METER (CATHETERS) ×3 IMPLANT
TRAY LAP CHOLE (CUSTOM PROCEDURE TRAY) ×3 IMPLANT
TROCAR BLADELESS OPT 5 75 (ENDOMECHANICALS) ×3 IMPLANT
TROCAR XCEL 12X100 BLDLESS (ENDOMECHANICALS) IMPLANT
TROCAR XCEL BLUNT TIP 100MML (ENDOMECHANICALS) IMPLANT
TROCAR XCEL NON-BLD 11X100MML (ENDOMECHANICALS) IMPLANT
TROCAR XCEL UNIV SLVE 11M 100M (ENDOMECHANICALS) IMPLANT
TUBING FILTER THERMOFLATOR (ELECTROSURGICAL) ×3 IMPLANT
YANKAUER SUCT BULB TIP 10FT TU (MISCELLANEOUS) ×3 IMPLANT
YANKAUER SUCT BULB TIP NO VENT (SUCTIONS) ×3 IMPLANT

## 2013-09-02 NOTE — Progress Notes (Signed)
NOS  S:  Patient doing pretty good.  Husband at bedside.  Discussed about limiting ice chips.  O:  BP 130/76  Pulse 95  Temp(Src) 97.6 F (36.4 C) (Oral)  Resp 12  Ht 5\' 4"  (1.626 m)  Wt 165 lb 5.5 oz (75 kg)  BMI 28.37 kg/m2  SpO2 95%  HEENT:  NGT in place. Abdomen:  Flat.  Dressing looks okay.  Urine:  Clear.  A&P:  Doing well early post op.  [name], MD, [facility] Surgery Pager: [phone] Office phone:  [phone]

## 2013-09-02 NOTE — Preoperative (Signed)
Beta Blockers   Reason not to administer Beta Blockers:Not Applicable, not on home BB 

## 2013-09-02 NOTE — Interval H&P Note (Signed)
History and Physical Interval Note:  09/02/2013 9:58 AM  [name]  has presented today for surgery, with the diagnosis of colon cancer  The various methods of treatment have been discussed with the patient and family.  Husband is here.  She did not like the bowel prep.  After consideration of risks, benefits and other options for treatment, the patient has consented to  Procedure(s): LAPAROSCOPIC LOW ANTERIOR RESECTION AND PROBABLE ILEOSTOMY (N/A) as a surgical intervention .  [name] history has been reviewed, patient examined, no change in status, stable for surgery.  I have reviewed the patient's chart and labs.  Questions were answered to the patient's satisfaction.     Aldene Hendon H

## 2013-09-02 NOTE — Transfer of Care (Signed)
Immediate Anesthesia Transfer of Care Note  Patient: [name]  Procedure(s) Performed: Procedure(s): LAPAROSCOPIC LOW ANTERIOR RESECTION (N/A)  Patient Location: PACU  Anesthesia Type:General  Level of Consciousness: Patient easily awoken, sedated, comfortable, cooperative, following commands, responds to stimulation.   Airway & Oxygen Therapy: Patient spontaneously breathing, ventilating well, oxygen via simple oxygen mask.  Post-op Assessment: Report given to PACU RN, vital signs reviewed and stable, moving all extremities.   Post vital signs: Reviewed and stable.  Complications: No apparent anesthesia complications

## 2013-09-02 NOTE — H&P (View-Only) (Signed)
Re:   [name] DOB:   1961/01/25 MRN:   [id number]  ASSESSMENT AND PLAN: 1.  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 Liliane Channel colonoscopy.  CT scan suggests enlarged lymph nodes.  Completed neoadjuvant chemo/radiation.  She received radiation therapy from Dr. Lisbeth Renshaw - 06/18/2013 - 07/26/2013.  She has seen Dr. Benay Spice who gave her Xeloda.  She is now ready to proceed with surgery.   I reviewed with the patient the findings and need for colon surgery.  II discussed the role of laparoscopic and open surgery in colon surgery.  I reviewed the risks of surgery, including, but not limited to, infection, bleeding, nerve injury, anastomotic leaks, and possibility of colostomy.  I discussed that most likely, I will give her a loop ileostomy as a protective measure for the anastomosis.  I went over the preoperative mechanical and antibiotic bowel prep for colon surgery and provided prescriptions for these.  I provided the patient literature on colon surgery.  I tried to answer all questions for the patient.  3.  DJD  Has seen Dr. Sherrian Divers 4.  Anxiety 5.  IUD - stuck  Sees Dr. Susette Racer  I will contact him about the date of surgery for possible removal at the same time. 6.  Indeterminate 6 mm liver lesion seen on CT scan 7.  History of HELLP syndrome with first pregnancy   Chief Complaint  Patient presents with  . Routine Post Op    rectal ca   REFERRING PHYSICIAN:  Dr. Lana Fish  HISTORY OF PRESENT ILLNESS: [name] is a 53 y.o. (DOB: 03-16-1961)  white  female whose primary care physician is NEAL,W RONALD, MD and comes to me today for follow up of rectal cancer.   Husband Denice Paradise is with patient. Her CEA - 3.2 - 08/14/2013  She went on a cruise in the [place] right after the completion of radiation tx.  She is doing well and in good spirits.  They just went to see [employer] [place] [employer] [place] the  other night. We reviewed the findings and treatment so far. I discussed the surgery, the time in the hospital, the idea of the protective ileostomy, and the time for recovery. We talked some about the pneumoperitoneum post colonoscopy and how long it took to get over.  History of Colon Cancer: Ms. Hawn has had no prior GI problems, though she has had rectal bleeding on and off for years.  She has attributed this to hemorrhoids and it has never bothered her.  Though over the last 2 months or so, she has noticed more blood than normal.  She has also felt more bloated, though she is not sure that her bowel movements have changed.  She has had no prior abdominal surgery.  She has no gastric, liver, or pancreatic disease.  No one in her family has had colon cancer.  In part because she is 53 and in part because of the blood in her stool, Ms. Cutter had a colonoscopy by Dr. Lana Fish on 05/24/2013.  She was found to have a tumor at 12 - 14 cm from the anal verge.  Biopsies (Accession:  [id number]) showed invasive adenocarcinoma arising in a tubulovillous adenoma.  She also had tubular adenomas of the cecum and descending colon.  She underwent CT abd/pelvis, 05/28/2013 - 1. Large amount of free intraperitoneal air likely due  to a perforation during the colonoscopy that sealed off. No leaking oral contrast, free fluid or abscess. 2. Asymmetric wall thickening involving the rectosigmoid colon likely colon cancer. There are enlarged lymph nodes in the mesocolon highly suspicious for metastatic disease. 3. Indeterminate 6 mm liver lesion on image number 26.   Past Medical History  Diagnosis Date  . No pertinent past medical history   . Arthritis   . Colon cancer     Past Surgical History  Procedure Laterality Date  . Back surgery    . Cesarean section      x2  . Dand c    . Joint replacement      lt knee  . Cervical disc arthroplasty  01/03/2012    Procedure: CERVICAL ANTERIOR DISC  ARTHROPLASTY;  Surgeon: Charlie Pitter, MD;  Location: [facility];  Service: Neurosurgery;  Laterality: N/A;  Cervical Anterior Disc Arthorplasty Five-Six  . Breast surgery    . Eus N/A 05/31/2013    Procedure: LOWER ENDOSCOPIC ULTRASOUND (EUS);  Surgeon: Beryle Beams, MD;  Location: [facility];  Service: Endoscopy;  Laterality: N/A;  . Breat reduction    . Intrauterine device insertion  2006    [facility]      Current Outpatient Prescriptions  Medication Sig Dispense Refill  . ALPRAZolam (XANAX) 0.5 MG tablet 0.5 mg at bedtime as needed.       . capecitabine (XELODA) 500 MG tablet Take 3 tablets (1,500 mg total) by mouth 2 (two) times daily after a meal. On days of radiation only (Mon-Fri) Dx: 154.1  168 tablet  0  . estradiol (VIVELLE-DOT) 0.05 MG/24HR Place 1 patch onto the skin 2 (two) times a week. Sundays and Wednesday      . hydrochlorothiazide (HYDRODIURIL) 25 MG tablet Take 25 mg by mouth daily.       . progesterone (PROMETRIUM) 200 MG capsule Take 200 mg by mouth. Every 3 months      . sertraline (ZOLOFT) 50 MG tablet Take 50 mg by mouth daily.       . TESTOSTERONE IM Inject into the muscle. Injection quarterly      . HYDROcodone-acetaminophen (NORCO) 5-325 MG per tablet Take 1 tablet by mouth every 6 (six) hours as needed for moderate pain.  60 tablet  0  . prochlorperazine (COMPAZINE) 10 MG tablet Take 1 tablet (10 mg total) by mouth every 6 (six) hours as needed for nausea or vomiting.  30 tablet  1   No current facility-administered medications for this visit.     No Known Allergies  REVIEW OF SYSTEMS: Skin:  No history of rash.  No history of abnormal moles. Infection:  No history of hepatitis or HIV.  No history of MRSA. Neurologic:  No history of stroke.  No history of seizure.  No history of headaches. Cardiac:  No history of hypertension. No history of heart disease.  No history of prior cardiac catheterization.  No history of seeing a cardiologist. Pulmonary:   Does not smoke cigarettes.  No asthma or bronchitis.  No OSA/CPAP. Breasts:  History of bilateral reduction mammoplasty  Endocrine:  No diabetes. No thyroid disease. Gastrointestinal:  See HPI. Urologic:  No history of kidney stones.  No history of bladder infections. GYN:  Has IUD (Ama) stuck in uterus.  This was placed in 2006.  If she needs surgery, she wants this removed. Musculoskeletal:  Dr. Kyra Leyland back surgery - 2011.  Dr. Sherrian Divers - C5-6 disk surgery -  01/03/2012.  She still has back disease, but this seems to be doing better.  She has a little achiness to her arms, which is unexplained at this time. Hematologic:  No bleeding disorder.  No history of anemia.  Not anticoagulated. Psycho-social:  The patient is oriented.   She has been under a lot of stress.  SOCIAL and FAMILY HISTORY: Married.  Husband Denice Paradise is with patient. (2nd marriage x 7 years) (Her first husband is dead, but she described him as a "shit") Her second husband had a daughter that went through treatment for a brain tumor, so he has some insight into the treatment of cancer. She works at [employer] as a Designer, television/film set. She has a son who is bipolar/anorexic/metally compromised  PHYSICAL EXAM: BP 124/64  Pulse 72  Temp(Src) 98 F (36.7 C)  Resp 18  Ht 5\' 5"  (1.651 m)  Wt 149 lb (67.586 kg)  BMI 24.79 kg/m2  General: WN WF who is alert and generally healthy appearing.  HEENT: Normal. Pupils equal. Neck: Supple. No mass.  No thyroid mass.  Scar at base of right neck from cervical disk surgery. Lymph Nodes:  No supraclavicular or cervical nodes. Lungs: Clear to auscultation and symmetric breath sounds. Heart:  RRR. No murmur or rub. Abdomen: Soft. No mass. No hernia. Normal bowel sounds.  No abdominal scars.   Extremities:  Good strength and ROM  in upper and lower extremities. Neurologic:  Grossly intact to motor and sensory function. Psychiatric: Has normal mood and affect. Behavior is normal.    DATA REVIEWED: Epic notes and treatment so far.  [name], MD,  [facility], [address]  [place], [place]    [place] Phone:  [phone] FAX:  [phone]

## 2013-09-02 NOTE — Anesthesia Preprocedure Evaluation (Addendum)
Anesthesia Evaluation  Patient identified by MRN, date of birth, ID band Patient awake    Reviewed: Allergy & Precautions, H&P , NPO status , Patient's Chart, lab work & pertinent test results  Airway Mallampati: II TM Distance: >3 FB Neck ROM: full    Dental no notable dental hx. (+) Teeth Intact, Dental Advisory Given   Pulmonary neg pulmonary ROS,  breath sounds clear to auscultation  Pulmonary exam normal       Cardiovascular Exercise Tolerance: Good negative cardio ROS  Rhythm:regular Rate:Normal     Neuro/Psych Cervical disc surgery negative neurological ROS  negative psych ROS   GI/Hepatic negative GI ROS, Neg liver ROS, Colon/rectal Ca   Endo/Other  negative endocrine ROS  Renal/GU negative Renal ROS  negative genitourinary   Musculoskeletal   Abdominal   Peds  Hematology negative hematology ROS (+)   Anesthesia Other Findings   Reproductive/Obstetrics negative OB ROS                          Anesthesia Physical Anesthesia Plan  ASA: III  Anesthesia Plan: General   Post-op Pain Management:    Induction: Intravenous  Airway Management Planned: Oral ETT  Additional Equipment:   Intra-op Plan:   Post-operative Plan: Extubation in OR  Informed Consent: I have reviewed the patients History and Physical, chart, labs and discussed the procedure including the risks, benefits and alternatives for the proposed anesthesia with the patient or authorized representative who has indicated his/her understanding and acceptance.   Dental Advisory Given  Plan Discussed with: CRNA and Surgeon  Anesthesia Plan Comments:         Anesthesia Quick Evaluation

## 2013-09-02 NOTE — Anesthesia Postprocedure Evaluation (Signed)
  Anesthesia Post-op Note  Patient: [name]  Procedure(s) Performed: Procedure(s) (LRB): LAPAROSCOPIC LOW ANTERIOR RESECTION (N/A)  Patient Location: PACU  Anesthesia Type: General  Level of Consciousness: awake and alert   Airway and Oxygen Therapy: Patient Spontanous Breathing  Post-op Pain: mild  Post-op Assessment: Post-op Vital signs reviewed, Patient's Cardiovascular Status Stable, Respiratory Function Stable, Patent Airway and No signs of Nausea or vomiting  Last Vitals:  Filed Vitals:   09/02/13 1555  BP: 114/71  Pulse: 73  Temp: 36.6 C  Resp: 14    Post-op Vital Signs: stable   Complications: No apparent anesthesia complications

## 2013-09-03 ENCOUNTER — Encounter (HOSPITAL_COMMUNITY): Payer: Self-pay | Admitting: Surgery

## 2013-09-03 LAB — CBC WITH DIFFERENTIAL/PLATELET
Basophils Absolute: 0 10*3/uL (ref 0.0–0.1)
Basophils Relative: 0 % (ref 0–1)
EOS PCT: 0 % (ref 0–5)
Eosinophils Absolute: 0 10*3/uL (ref 0.0–0.7)
HCT: 36 % (ref 36.0–46.0)
HEMOGLOBIN: 12.1 g/dL (ref 12.0–15.0)
LYMPHS ABS: 0.5 10*3/uL — AB (ref 0.7–4.0)
LYMPHS PCT: 5 % — AB (ref 12–46)
MCH: 32.3 pg (ref 26.0–34.0)
MCHC: 33.6 g/dL (ref 30.0–36.0)
MCV: 96 fL (ref 78.0–100.0)
MONO ABS: 0.4 10*3/uL (ref 0.1–1.0)
MONOS PCT: 4 % (ref 3–12)
Neutro Abs: 10.1 10*3/uL — ABNORMAL HIGH (ref 1.7–7.7)
Neutrophils Relative %: 92 % — ABNORMAL HIGH (ref 43–77)
Platelets: 212 10*3/uL (ref 150–400)
RBC: 3.75 MIL/uL — AB (ref 3.87–5.11)
RDW: 14.3 % (ref 11.5–15.5)
WBC: 11 10*3/uL — AB (ref 4.0–10.5)

## 2013-09-03 LAB — BASIC METABOLIC PANEL
BUN: 7 mg/dL (ref 6–23)
CHLORIDE: 104 meq/L (ref 96–112)
CO2: 27 mEq/L (ref 19–32)
CREATININE: 0.75 mg/dL (ref 0.50–1.10)
Calcium: 8.3 mg/dL — ABNORMAL LOW (ref 8.4–10.5)
GFR calc Af Amer: >90 mL/min (ref >90)
GFR calc non Af Amer: >90 mL/min (ref >90)
Glucose, Bld: 150 mg/dL — ABNORMAL HIGH (ref 70–99)
Potassium: 4.7 mEq/L (ref 3.7–5.3)
SODIUM: 141 meq/L (ref 137–147)

## 2013-09-03 MED ORDER — MENTHOL 3 MG MT LOZG
1.0000 | LOZENGE | OROMUCOSAL | Status: DC | PRN
  Filled 2013-09-03: qty 9

## 2013-09-03 MED ORDER — PHENOL 1.4 % MT LIQD
1.0000 | OROMUCOSAL | Status: DC | PRN
  Filled 2013-09-03: qty 177

## 2013-09-03 NOTE — Op Note (Signed)
[name], [name] NO.:  [id number]  MEDICAL RECORD NO.:  [id number]  LOCATION:  [id number]                         FACILITY:  [facility]  PHYSICIAN:  [name]. [name], M.D.  DATE OF BIRTH:  1960-11-09  DATE OF PROCEDURE:  09/02/2013 DATE OF DISCHARGE:                              OPERATIVE REPORT   PREOPERATIVE DIAGNOSIS:  Rectal cancer at about 12 cm from anal verge.  POSTOPERATIVE DIAGNOSIS:  Rectal cancer at about 12 cm from anal verge.  PROCEDURE:  Laparoscopic-assisted low anterior colon resection with #29 EEA anastomosis.  Appedectomy  SURGEON:  [name]. Renee Gaskins, MD.  FIRST ASSISTANT:  Renee Caprice. Renee Done, MD.  ANESTHESIA:  General anesthesia, supervised by Dr. Rod Hebert.  ESTIMATED BLOOD LOSS:  150 mL.  DRAINS:  Left in were none.  LOCAL MEDICINE:  30 mL of 0.25% Marcaine.  SPECIMEN:  Distal sigmoid proximal rectum with suture marker in the proximal end.  Dr. Claudette Hebert did a gross examination, thought we had a 3.5-cm distal margin.  Distal ring of EEA.  Appendix.  INDICATION FOR PROCEDURE:  [name] is a 53 year old white female who is a patient of Dr. Evette Hebert, who comes for surgical treatment of a rectal carcinoma. She originally presented on May 29, 2013, after [name] did a colonoscopy and identified a rectal adenocarcinoma approximately 12 to 14 cm from the anal verge.  She saw [name] and [name] for oncology and she underwent chemoradiation from June 18, 2013, to July 26, 2013.  She has completed this and now comes for surgical excision of her tumor.  The indication and potential complications of surgery were explained to the patient.  Potential complications included, but not limited to bleeding, infection, leak from the bowel, and nerve injury.  I also spent a lengthy time that I would most likely give her a loop ileostomy if there is any problems with the surgery or if there was a middle or low rectal  cancer.  DESCRIPTION OF PROCEDURE:  The patient was placed in lithotomy position, given general endotracheal anesthetic, supervised by Dr. Rod Hebert. She had 2 g of cefotetan at the initiation of procedure.  She was prepped with ChloraPrep and sterilely draped.  A time-out was held and the surgical checklist run.  I accessed her abdominal cavity through the right upper quadrant with a 5-mm OptiView.  I placed three additional trocars; right lower quadrant 5-mm trocar, at midline, below the umbilicus, a 5-mm trocar, and a 5-mm left lower quadrant trocar.  I explored the abdomen.  The right and left lobes of liver were unremarkable.  There was no nodule or mass noted.  The gallbladder was unremarkable.  Stomach was unremarkable.  The bowel that I could see was unremarkable.  The sigmoid colon was somewhat redundant and had some adhesions along the left anterior lateral pelvic sidewall.  I went down to the pelvis.  We could see where [name] had tattooed near the tumor.  The tattoo appeared a little proximal to where the tumor was.  The tumor was right and just below the peritoneal reflection.  First, I identified the ureter on the left side, took  down the adhesions from the left lateral sigmoid from the left pelvic sidewall.  I went up about midway up the left colon to mobilize the distal left colon and sigmoid colon for adequate length to get down to the pelvis.  I then took down the mesentery of the sigmoid colon.  I took down the superior hemorrhoidal vessels between clips, identified the left ureter, and traced this out.  I identified the right ureter behind the peritoneum. I tried to do a mesorectal excision going below the tumor.  I went to the presacral space and swept the colon/rectum anteriorly and laterally.  I cut to the lateral pedicles on both sides.  I mobilized this and went anteriorly to the rectum and mobilized the rectum from the posterior vagina.  I thought I had dissected about  3 or 4 cm below the tumor.  At this point, I was converted to an open operation.  I made a lower midline abdominal incision about 8 cm in length.  I had taken down the mesentery of the sigmoid colon.  I divided the main sigmoid branch above the bifurcation of the aorta.  I divided the colon with a 75 mm GIA.  I then reflected the colon inferiorly.  There were still some attachments going posteriorly in the presacral space which required further dissection.  At this point, I thought I had taken the dissection down about 5 cm beyond the tumor.  At this point I had cut across the mesorectum. The tumor was at and just below the peritoneal reflection.  I used a blue Ethicon Contour stapleer to divide the colon and sent the specimen to Pathology.  I marked the proximal colon with a 2-0 silk suture.  I talked to Dr. Claudette Hebert and he said the distal margin appeared to be 3.5 cm grossly beyond the tumor.    I then cleaned up the proximal sigmoid colon.  I used a whipstitch of 2-0 Prolene suture to tie over the anvil.  The proximal colon size was at about #29, so I used a 29 anvil and tied this around the proximal sigmoid colon.  Dr. Johnathan Hebert went below and passed the Ethicon EEA #29 stapler up the rectum.  He then fired the spear through the rectal stump.  I attached the proximal bowel.  He closed this down to about 1/3rd in the green zone in the EEA and then fired.  I got two good rings.  I only sent the distal ring for tumor evaluation.  These rings were complete.  I then clamped off the sigmoid colon.  Dr. Hassell Hebert insufflated the rectum with air until it was under pressure. There was no bubbling at the anastomosis.  He estimated the anastomosis being about 8 to 10 cm.  Because I had seen the stump well, because the dissection went well, and I had two good rings without evidence of leak, I thought it would be worth not giving the patient a diverting ileostomy and the problems attendant with that.  I did  remove her appendix.  I took the mesentery, the appendix down with a Harmonic Scalpel.  I divided the base of the appendix with a 2-0 Vicryl suture, and then did a pursestring 2-0 Vicryl around the stump of the appendix and then tucked this with a 2-0 silk suture.  This will be sent along with the colon specimen.  I also did a manual exam of both lobes of liver, I could feel no nodularity.  Position  of the NG tube in good position.  The small bowel that I could see was unremarkable.  There was no other mass or nodule identified.  I irrigated the abdomen with 3 L of saline.  I closed the peritoneum with a running 2-0 Vicryl suture.  I closed the fascia with 2 running #1 PDS sutures and I used a couple of interrupted #1 PDS sutures.  I irrigated the wound with 500 mL of saline, stapled the skin, closed.  I did laparoscope the patient at the end of the procedure.  I injected 30 mL of 0.25% Marcaine as a block along the wound and the peritoneal closure looked good.  There was no bleeding.  I did remove all the three trocars and stapled those wounds closed.  The patient tolerated the procedure well, was transported to the recovery room in good condition.  Sponge and needle counts were correct at the end of the case.  There were final pathology is pending at the time of this dictation.    [name]. [name], M.D., FACS   DHN/MEDQ  D:  09/02/2013  T:  09/03/2013  Job:  [id number]  cc:   Mayme Genta, M.D. Fax: [phone]  W. [name], M.D. Fax: [phone]  Jodelle Gross, M.D., Ph.D. Fax: [phone]  Tory Emerald. Benson Norway, MD Fax: [phone]  Ladell Pier, M.D. Fax: [phone]

## 2013-09-03 NOTE — Progress Notes (Signed)
General Surgery Note  LOS: 1 day  POD -  1 Day Post-Op  Assessment/Plan: 1.  Lap assisted LAPAROSCOPIC LOW ANTERIOR RESECTION, appendectomy - 09/02/2013 - D. [employer]  Doing well.  Ambulated in hall twice.  Repeat labs in AM   2.  DVT prophylaxis - SQ Heparin 3. DJD   Has seen Dr. Sherrian Divers  4. Anxiety  5. IUD - stuck   Sees Dr. Susette Racer  6. Indeterminate 6 mm liver lesion seen on CT scan  7. History of HELLP syndrome with first pregnancy  8. According to her husband, she does hear well without her hearing aids. 9.  Foley  To d/c   Active Problems:   Rectal cancer, 12 - 15 cm from anal verge  Subjective:  Doing well.  Did not sleep well.  But ambulated in hall twice. Objective:   Filed Vitals:   09/03/13 0424  BP: 117/70  Pulse: 86  Temp: 98.3 F (36.8 C)  Resp: 14     Intake/Output from previous day:  03/16 0701 - 03/17 0700 In: 5705 [P.O.:240; I.V.:4935; NG/GT:80; IV Piggyback:450] Out: 1960 [MVHQI:6962; Blood:10]  Intake/Output this shift:      Physical Exam:   General: WN WF who is alert and oriented.    HEENT: Normal. Pupils equal. .   Lungs: Clear   Abdomen: Soft.  No BS.   Wound: Clean   Lab Results:    Recent Labs  09/03/13 0430  WBC 11.0*  HGB 12.1  HCT 36.0  PLT 212    BMET   Recent Labs  09/03/13 0430  NA 141  K 4.7  CL 104  CO2 27  GLUCOSE 150*  BUN 7  CREATININE 0.75  CALCIUM 8.3*    PT/INR  No results found for this basename: LABPROT, INR,  in the last 72 hours  ABG  No results found for this basename: PHART, PCO2, PO2, HCO3,  in the last 72 hours   Studies/Results:  No results found.   Anti-infectives:   Anti-infectives   Start     Dose/Rate Route Frequency Ordered Stop   09/02/13 2200  cefoTEtan (CEFOTAN) 2 g in dextrose 5 % 50 mL IVPB     2 g 100 mL/hr over 30 Minutes Intravenous Every 12 hours 09/02/13 1618 09/02/13 2215   09/02/13 0803  cefoTEtan (CEFOTAN) 2 g in dextrose 5 % 50 mL IVPB     2 g 100 mL/hr over 30  Minutes Intravenous On call to O.R. 09/02/13 0803 09/02/13 1011   09/02/13 0803  neomycin (MYCIFRADIN) tablet 1,000 mg  Status:  Discontinued     1,000 mg Oral 3 times per day 09/02/13 0803 09/02/13 0832   09/02/13 0803  erythromycin (E-MYCIN) tablet 1,000 mg  Status:  Discontinued     1,000 mg Oral 3 times per day 09/02/13 0803 09/02/13 0832      [name], MD, FACS Pager: [place] [facility] Office: [phone] 09/03/2013

## 2013-09-03 NOTE — Care Management Note (Signed)
    Page 1 of 1   09/03/2013     11:04:34 AM   CARE MANAGEMENT NOTE 09/03/2013  Patient:  [name], [name]   Account Number:  [id number]  Date Initiated:  09/03/2013  Documentation initiated by:  Sunday Spillers  Subjective/Objective Assessment:   53 yo female admitted s/p LAR. PTA lived at home with spouse.     Action/Plan:   Home when stable   Anticipated DC Date:  09/06/2013   Anticipated DC Plan:  [place]  CM consult      Choice offered to / List presented to:             Status of service:  Completed, signed off Medicare Important Message given?   (If response is "NO", the following Medicare IM given date fields will be blank) Date Medicare IM given:   Date Additional Medicare IM given:    Discharge Disposition:  HOME/SELF CARE  Per UR Regulation:  Reviewed for med. necessity/level of care/duration of stay  If discussed at [place] of Stay Meetings, dates discussed:    Comments:

## 2013-09-04 LAB — CBC WITH DIFFERENTIAL/PLATELET
Basophils Absolute: 0 10*3/uL (ref 0.0–0.1)
Basophils Relative: 0 % (ref 0–1)
EOS PCT: 2 % (ref 0–5)
Eosinophils Absolute: 0.1 10*3/uL (ref 0.0–0.7)
HEMATOCRIT: 34.6 % — AB (ref 36.0–46.0)
Hemoglobin: 11.3 g/dL — ABNORMAL LOW (ref 12.0–15.0)
LYMPHS ABS: 0.4 10*3/uL — AB (ref 0.7–4.0)
Lymphocytes Relative: 6 % — ABNORMAL LOW (ref 12–46)
MCH: 32.8 pg (ref 26.0–34.0)
MCHC: 32.7 g/dL (ref 30.0–36.0)
MCV: 100.3 fL — AB (ref 78.0–100.0)
MONO ABS: 0.4 10*3/uL (ref 0.1–1.0)
Monocytes Relative: 6 % (ref 3–12)
NEUTROS ABS: 5.1 10*3/uL (ref 1.7–7.7)
Neutrophils Relative %: 86 % — ABNORMAL HIGH (ref 43–77)
Platelets: 178 10*3/uL (ref 150–400)
RBC: 3.45 MIL/uL — ABNORMAL LOW (ref 3.87–5.11)
RDW: 14.9 % (ref 11.5–15.5)
WBC: 6 10*3/uL (ref 4.0–10.5)

## 2013-09-04 LAB — BASIC METABOLIC PANEL
BUN: 5 mg/dL — ABNORMAL LOW (ref 6–23)
CO2: 27 meq/L (ref 19–32)
CREATININE: 0.65 mg/dL (ref 0.50–1.10)
Calcium: 8.3 mg/dL — ABNORMAL LOW (ref 8.4–10.5)
Chloride: 104 mEq/L (ref 96–112)
GFR calc Af Amer: >90 mL/min (ref >90)
GFR calc non Af Amer: >90 mL/min (ref >90)
GLUCOSE: 110 mg/dL — AB (ref 70–99)
Potassium: 4.1 mEq/L (ref 3.7–5.3)
Sodium: 140 mEq/L (ref 137–147)

## 2013-09-04 MED ORDER — ALUM & MAG HYDROXIDE-SIMETH 200-200-20 MG/5ML PO SUSP
15.0000 mL | Freq: Once | ORAL | Status: AC
  Administered 2013-09-04: 15 mL via ORAL
  Filled 2013-09-04: qty 30

## 2013-09-04 MED ORDER — PANTOPRAZOLE SODIUM 40 MG IV SOLR
40.0000 mg | INTRAVENOUS | Status: DC
  Administered 2013-09-04 – 2013-09-07 (×4): 40 mg via INTRAVENOUS
  Filled 2013-09-04 (×5): qty 40

## 2013-09-04 NOTE — Progress Notes (Signed)
General Surgery Note  LOS: 2 days  POD -  2 Days Post-Op  Assessment/Plan: 1.  Lap assisted LAPAROSCOPIC LOW ANTERIOR RESECTION, appendectomy - 09/02/2013 - D. Donald Memoli  Final path - Invasive adenoca (3.7 cm) with 7/11 nodes positive.  Margins clear.  Doing okay.  Will try NGT out.  Discussed pain meds option.   2.  DVT prophylaxis - SQ Heparin 3. DJD   Has seen Dr. Sherrian Divers  4. Anxiety  5. IUD - stuck   Sees Dr. Susette Racer  6. Indeterminate 6 mm liver lesion seen on CT scan  7. History of HELLP syndrome with first pregnancy  8. According to her husband, she does hear well without her hearing aids. 9.  Urinary incontinence.  She had some issues before surgery and is having more trouble now.   Active Problems:   Rectal cancer, 12 - 15 cm from anal verge  Subjective:  Doing okay.  Still not sleeping.  Husband in room.  Still has trouble with feeling when she needs to urinate.  The O2 sensor for the PCA is bothering her.  I offered to switch her off the PCA, but she wants to wait for now.  I reviewed path report with patient and gave them a copy of the report.  Objective:   Filed Vitals:   09/04/13 0600  BP:   Pulse:   Temp:   Resp: 14     Intake/Output from previous day:  03/17 0701 - 03/18 0700 In: 3112 [I.V.:3082; NG/GT:30] Out: 1350 [Urine:1050; Emesis/NG output:300]  Intake/Output this shift:  Total I/O In: -  Out: 200 [Urine:200]   Physical Exam:   General: WN WF who is alert and oriented.    HEENT: Normal. Pupils equal. .   Lungs: Clear   Abdomen: Mild distention.  No BS.   Wound: Clean   Lab Results:     Recent Labs  09/03/13 0430 09/04/13 0415  WBC 11.0* 6.0  HGB 12.1 11.3*  HCT 36.0 34.6*  PLT 212 178    BMET    Recent Labs  09/03/13 0430 09/04/13 0415  NA 141 140  K 4.7 4.1  CL 104 104  CO2 27 27  GLUCOSE 150* 110*  BUN 7 5*  CREATININE 0.75 0.65  CALCIUM 8.3* 8.3*    PT/INR  No results found for this basename: LABPROT, INR,  in  the last 72 hours  ABG  No results found for this basename: PHART, PCO2, PO2, HCO3,  in the last 72 hours   Studies/Results:  No results found.   Anti-infectives:   Anti-infectives   Start     Dose/Rate Route Frequency Ordered Stop   09/02/13 2200  cefoTEtan (CEFOTAN) 2 g in dextrose 5 % 50 mL IVPB     2 g 100 mL/hr over 30 Minutes Intravenous Every 12 hours 09/02/13 1618 09/02/13 2215   09/02/13 0803  cefoTEtan (CEFOTAN) 2 g in dextrose 5 % 50 mL IVPB     2 g 100 mL/hr over 30 Minutes Intravenous On call to O.R. 09/02/13 0803 09/02/13 1011   09/02/13 0803  neomycin (MYCIFRADIN) tablet 1,000 mg  Status:  Discontinued     1,000 mg Oral 3 times per day 09/02/13 0803 09/02/13 0832   09/02/13 0803  erythromycin (E-MYCIN) tablet 1,000 mg  Status:  Discontinued     1,000 mg Oral 3 times per day 09/02/13 0803 09/02/13 0832      [name], MD, FACS Pager: [place]  [facility] Office: [phone] 09/04/2013

## 2013-09-05 DIAGNOSIS — K769 Liver disease, unspecified: Secondary | ICD-10-CM

## 2013-09-05 DIAGNOSIS — C20 Malignant neoplasm of rectum: Principal | ICD-10-CM

## 2013-09-05 MED ORDER — MORPHINE SULFATE 2 MG/ML IJ SOLN: 1.0000 mg | INTRAMUSCULAR | Status: DC | PRN

## 2013-09-05 MED ORDER — ZOLPIDEM TARTRATE 5 MG PO TABS
5.0000 mg | ORAL_TABLET | Freq: Every evening | ORAL | Status: DC | PRN
  Administered 2013-09-05 – 2013-09-06 (×2): 5 mg via ORAL
  Filled 2013-09-05 (×2): qty 1

## 2013-09-05 NOTE — Progress Notes (Signed)
General Surgery Note  LOS: 3 days  POD -  3 Days Post-Op  Assessment/Plan: 1.  Lap assisted LAPAROSCOPIC LOW ANTERIOR RESECTION, appendectomy - 09/02/2013 - D. [employer]man Trentman  Final path - Invasive adenoca (3.7 cm) with 7/11 nodes positive.  Margins clear.  Ileus.  Continue NPO.  Wants to switch to oral/IV pain med and get off PCA.   2.  DVT prophylaxis - SQ Heparin 3. DJD   Has seen Dr. Sherrian Divers  4. Anxiety  5. IUD - stuck   Sees Dr. Susette Racer  6. Indeterminate 6 mm liver lesion seen on CT scan  7. History of HELLP syndrome with first pregnancy  8. According to her husband, she does hear well without her hearing aids. 9.  Urinary incontinence.  She had some issues before surgery.  Still says that she is "numb" and has no sensation of urination.   Active Problems:   Rectal cancer, 12 - 15 cm from anal verge  Subjective:  Doing okay.  Smiling and looks better.  He husband was helping her with a bath.  Still having urinary problems.  No BM.  Dr. Benay Spice got by this AM.  Objective:   Filed Vitals:   09/05/13 0837  BP:   Pulse:   Temp:   Resp: 18     Intake/Output from previous day:  03/18 0701 - 03/19 0700 In: 3792.9 [I.V.:3792.9] Out: 1900 [Urine:1900]  Intake/Output this shift:      Physical Exam:   General: WN WF who is alert and oriented.    HEENT: Normal. Pupils equal. .   Lungs: Clear   Abdomen: Mild distention.  Few BS.   Wound: Clean   Lab Results:     Recent Labs  09/03/13 0430 09/04/13 0415  WBC 11.0* 6.0  HGB 12.1 11.3*  HCT 36.0 34.6*  PLT 212 178    BMET    Recent Labs  09/03/13 0430 09/04/13 0415  NA 141 140  K 4.7 4.1  CL 104 104  CO2 27 27  GLUCOSE 150* 110*  BUN 7 5*  CREATININE 0.75 0.65  CALCIUM 8.3* 8.3*    PT/INR  No results found for this basename: LABPROT, INR,  in the last 72 hours  ABG  No results found for this basename: PHART, PCO2, PO2, HCO3,  in the last 72 hours   Studies/Results:  No results  found.   Anti-infectives:   Anti-infectives   Start     Dose/Rate Route Frequency Ordered Stop   09/02/13 2200  cefoTEtan (CEFOTAN) 2 g in dextrose 5 % 50 mL IVPB     2 g 100 mL/hr over 30 Minutes Intravenous Every 12 hours 09/02/13 1618 09/02/13 2215   09/02/13 0803  cefoTEtan (CEFOTAN) 2 g in dextrose 5 % 50 mL IVPB     2 g 100 mL/hr over 30 Minutes Intravenous On call to O.R. 09/02/13 0803 09/02/13 1011   09/02/13 0803  neomycin (MYCIFRADIN) tablet 1,000 mg  Status:  Discontinued     1,000 mg Oral 3 times per day 09/02/13 0803 09/02/13 0832   09/02/13 0803  erythromycin (E-MYCIN) tablet 1,000 mg  Status:  Discontinued     1,000 mg Oral 3 times per day 09/02/13 0803 09/02/13 0832      [name], MD, FACS Pager: [place] [facility] Office: [phone] 09/05/2013

## 2013-09-05 NOTE — Progress Notes (Signed)
IP PROGRESS NOTE  Subjective:   Mrs. [name] underwent a laparoscopic assisted low anterior resection and appendectomy on 09/02/2013. The liver was unremarkable. The tumor was noted below the peritoneal reflection.  The pathology ([id number]) confirmed invasive adenocarcinoma extending into perirectal adipose tissue. The surgical margins were negative. Metastatic carcinoma was identified in 7 of 11 lymph nodes. Extensive lymphovascular invasion was seen. No macroscopic tumor perforation. The tumor was grade 3. No residual mucosal tumor was seen.  She is is recovering from surgery. She reports an episode of hematuria and urinary incontinence. Her bowels have not moved since surgery. She is ambulating.  Objective: Vital signs in last 24 hours: Blood pressure 127/83, pulse 99, temperature 98.3 F (36.8 C), temperature source Oral, resp. rate 20, height 5\' 4"  (1.626 m), weight 165 lb 5.5 oz (75 kg), SpO2 93.00%.  Intake/Output from previous day: 03/18 0701 - 03/19 0700 In: 3792.9 [I.V.:3792.9] Out: 1900 [Urine:1900]  Physical Exam: Lungs: Coarse rhonchi at the lower posterior chest with decreased breath sounds, no respiratory distress Cardiac: Regular rate and rhythm Abdomen: Distended, dry surgical dressing Extremities: No leg edema Skin: Faint confluent erythema over the trunk Neurologic: The foot strength is intact bilaterally  Portacath/PICC-without erythema  Lab Results:  Recent Labs  09/03/13 0430 09/04/13 0415  WBC 11.0* 6.0  HGB 12.1 11.3*  HCT 36.0 34.6*  PLT 212 178    BMET  Recent Labs  09/03/13 0430 09/04/13 0415  NA 141 140  K 4.7 4.1  CL 104 104  CO2 27 27  GLUCOSE 150* 110*  BUN 7 5*  CREATININE 0.75 0.65  CALCIUM 8.3* 8.3*    Studies/Results: No results found.  Medications: I have reviewed the patient's current medications.  Assessment/Plan: 1. Rectal cancer, clinical stage III (T3 N1) status post biopsy of a rectal mass on 05/24/2013  confirming adenocarcinoma. Endoscopic ultrasound 05/31/2013 measured the mass at 11 cm from the anal verge, [id number]. Initiation of radiation and concurrent Xeloda 06/18/2013; completion 07/26/2013.  Low anterior resection 09/02/2013 confirming a grade 3,pT3pN2b, adenocarcinoma with negative resection margins 2. Post colonoscopy bowel perforation confirmed on abdomen CT 05/28/2013. 3. Single indeterminate liver lesion on the abdomen CT 05/28/2013. MRI on 06/18/2013 confirmed 2 tiny liver lesions, indeterminate-favored to be benign cysts 4. History of cervical and lumbar disc disease.  5. History of HELLP syndrome with first pregnancy.  6. Postoperative ileus  She has been diagnosed with pathologic stage IIIc rectal cancer. I discussed the pathology report and adjuvant treatment options with Ms. Vidrio and her husband. She understands the significant risk of developing progression of rectal cancer over the next several years. We discussed the indication for adjuvant 5-fluorouracil based chemotherapy. We specifically discussed FOLFOX and CAPOX chemotherapy. She understands the lack of data to support the use of oxaliplatin in resected stage III rectal cancer. However oxaliplatin has been confirmed to improve the disease-free survival in patients with stage III colon cancer and many oncologists  recommend oxaliplatin in stage III rectal cancer. She has a high risk of developing recurrent disease. I recommend adjuvant 5-FU and oxaliplatin chemotherapy.  We discussed the specific toxicities associated with oxaliplatin including the chance for nausea/vomiting, hematologic toxicity, and neuropathy. We reviewed the various types of neuropathy seen with oxaliplatin. We discussed the treatment schema of the FOLFOX and CAPOX regimens. We also reviewed the toxicities associated with 5-fluorouracil and capecitabine. She was treated with capecitabine in the neoadjuvant setting.  She is scheduled for a return visit  09/17/2013. We will make a  decision on CAPOX versus FOLFOX then.  Recommendations: 1. Port-A-Cath placement per Dr. [name] 2. Followup 09/17/2013 at the [facility] as scheduled    LOS: 3 days   [facility], Dominica Severin  09/05/2013, 5:45 PM

## 2013-09-06 NOTE — Progress Notes (Signed)
Patient continues to complain of numbness in perineal  area. She would like her neurosurgeon Dr. Deri Fuelling contacted to assess her. She has already notified his office. I made Dr. [name] aware of patients concern. No immediate distress. Will continue to monitor.

## 2013-09-06 NOTE — Progress Notes (Signed)
General Surgery Note  LOS: 4 days  POD -  4 Days Post-Op  Assessment/Plan: 1.  Lap assisted LAPAROSCOPIC LOW ANTERIOR RESECTION, appendectomy - 09/02/2013 - D. Caulder Wehner  Final path - Invasive adenoca (3.7 cm) with 7/11 nodes positive.  Margins clear.  Had a couple of BM's.  Incontinent.  Still "numb" in perineum and having trouble feeling rectum/perineum.   2.  DVT prophylaxis - SQ Heparin 3. DJD   Has seen Dr. Sherrian Divers  4. Anxiety  5. IUD - stuck   Sees Dr. Susette Racer  6. Indeterminate 6 mm liver lesion seen on CT scan  7. History of HELLP syndrome with first pregnancy  8. According to her husband, she does hear well without her hearing aids. 9.  Urinary incontinence.  She had some issues before surgery.  Still says that she is "numb" and has no sensation of urination. 10.  History of back surgery with prior perineal numbness   Active Problems:   Rectal cancer, 12 - 15 cm from anal verge  Subjective:  BM started.  No nausea.  Slept well.  Will start clear liquids.  Objective:   Filed Vitals:   09/06/13 0553  BP: 125/82  Pulse: 75  Temp: 99.9 F (37.7 C)  Resp: 18     Intake/Output from previous day:  03/19 0701 - 03/20 0700 In: 2879.2 [I.V.:2879.2] Out: 2150 [Urine:2150]  Intake/Output this shift:      Physical Exam:   General: WN WF who is alert and oriented.    HEENT: Normal. Pupils equal. .   Lungs: Clear   Abdomen: Mild distention.  Few BS.   Wound: Clean   Lab Results:     Recent Labs  09/04/13 0415  WBC 6.0  HGB 11.3*  HCT 34.6*  PLT 178    BMET    Recent Labs  09/04/13 0415  NA 140  K 4.1  CL 104  CO2 27  GLUCOSE 110*  BUN 5*  CREATININE 0.65  CALCIUM 8.3*    PT/INR  No results found for this basename: LABPROT, INR,  in the last 72 hours  ABG  No results found for this basename: PHART, PCO2, PO2, HCO3,  in the last 72 hours   Studies/Results:  No results found.   Anti-infectives:   Anti-infectives   Start     Dose/Rate  Route Frequency Ordered Stop   09/02/13 2200  cefoTEtan (CEFOTAN) 2 g in dextrose 5 % 50 mL IVPB     2 g 100 mL/hr over 30 Minutes Intravenous Every 12 hours 09/02/13 1618 09/02/13 2215   09/02/13 0803  cefoTEtan (CEFOTAN) 2 g in dextrose 5 % 50 mL IVPB     2 g 100 mL/hr over 30 Minutes Intravenous On call to O.R. 09/02/13 0803 09/02/13 1011   09/02/13 0803  neomycin (MYCIFRADIN) tablet 1,000 mg  Status:  Discontinued     1,000 mg Oral 3 times per day 09/02/13 0803 09/02/13 0832   09/02/13 0803  erythromycin (E-MYCIN) tablet 1,000 mg  Status:  Discontinued     1,000 mg Oral 3 times per day 09/02/13 0803 09/02/13 0832      [name], MD, FACS Pager: [place] [facility] Office: [phone] 09/06/2013

## 2013-09-07 NOTE — Progress Notes (Signed)
Spoke briefly with pt [name] around left labia and doing positional/strains to void Now can hold urine and feels full ? Neuropraxia Discussed with pt briefly Order PVR and if OK bladder is safe I gave my card and pt will see me in f/up in clinic in re 2 weeks or sooner if needed

## 2013-09-07 NOTE — Progress Notes (Signed)
General Surgery Note  LOS: 5 days  POD -  5 Days Post-Op  Assessment/Plan: 1.  Lap assisted LAPAROSCOPIC LOW ANTERIOR RESECTION, appendectomy - 09/02/2013 - D. 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  Margins clear.  Still "numb" in perineum and having trouble feeling rectum/perineum.  Will advance to reg diet.  May shower.   2.  DVT prophylaxis - SQ Heparin 3.  DJD   Has seen Dr. Sherrian Divers  4. Anxiety  5. IUD - stuck   Sees Dr. Susette Racer  6. Indeterminate 6 mm liver lesion seen on CT scan  7. History of HELLP syndrome with first pregnancy  8. According to her husband, she does hear well without her hearing aids. 9.  Urinary incontinence.  She had some issues before surgery.  Still says that she is "numb" and has no sensation of urination.  This may be better today.  I will speak with urology (S. MacDiarmid) to see her and help follow. 10.  History of back surgery with prior perineal numbness  I spoke to Dr. Trenton Gammon yesterday.  He said that he would be happy to see her in 1 to 2 weeks in his office.   Active Problems:   Rectal cancer, 12 - 15 cm from anal verge  Subjective:  Tolerated clear liquids.  Limited BM, but passing flatus.  Husband in room.  Objective:   Filed Vitals:   09/07/13 0517  BP: 135/89  Pulse: 60  Temp: 98.4 F (36.9 C)  Resp: 18     Intake/Output from previous day:  03/20 0701 - 03/21 0700 In: 1157.5 [P.O.:840; I.V.:317.5] Out: 600 [Urine:600]  Intake/Output this shift:      Physical Exam:   General: WN WF who is alert and oriented.    HEENT: Normal. Pupils equal. .   Lungs: Clear   Abdomen: Mild distention.  Few BS.   Wound: Clean   Lab Results:    No results found for this basename: WBC, HGB, HCT, PLT,  in the last 72 hours  BMET   No results found for this basename: NA, K, CL, CO2, GLUCOSE, BUN, CREATININE, CALCIUM,  in the last 72 hours  PT/INR  No results found for this basename: LABPROT, INR,  in the  last 72 hours  ABG  No results found for this basename: PHART, PCO2, PO2, HCO3,  in the last 72 hours   Studies/Results:  No results found.   Anti-infectives:   Anti-infectives   Start     Dose/Rate Route Frequency Ordered Stop   09/02/13 2200  cefoTEtan (CEFOTAN) 2 g in dextrose 5 % 50 mL IVPB     2 g 100 mL/hr over 30 Minutes Intravenous Every 12 hours 09/02/13 1618 09/02/13 2215   09/02/13 0803  cefoTEtan (CEFOTAN) 2 g in dextrose 5 % 50 mL IVPB     2 g 100 mL/hr over 30 Minutes Intravenous On call to O.R. 09/02/13 0803 09/02/13 1011   09/02/13 0803  neomycin (MYCIFRADIN) tablet 1,000 mg  Status:  Discontinued     1,000 mg Oral 3 times per day 09/02/13 0803 09/02/13 0832   09/02/13 0803  erythromycin (E-MYCIN) tablet 1,000 mg  Status:  Discontinued     1,000 mg Oral 3 times per day 09/02/13 0803 09/02/13 0832      [name], MD, FACS Pager: [facility] Office: [phone] 09/07/2013

## 2013-09-08 NOTE — Progress Notes (Signed)
General Surgery Note  LOS: 6 days  POD -  6 Days Post-Op  Assessment/Plan: 1.  Lap assisted LAPAROSCOPIC LOW ANTERIOR RESECTION, appendectomy - 09/02/2013 - D. 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  Margins clear.  Still "numb" in perineum and having trouble feeling rectum/perineum.  Looks good, but has enough little issues to keep one more day.   2.  DVT prophylaxis - SQ Heparin 3.  DJD   Has seen Dr. Sherrian Divers  4. Anxiety  5. IUD - stuck   Sees Dr. Susette Racer  6. Indeterminate 6 mm liver lesion seen on CT scan  7. History of HELLP syndrome with first pregnancy  8. According to her husband, she does hear well without her hearing aids. 9.  Urinary incontinence.  Appreciate Dr. Chauncey Cruel. MacDiarmid's input.  She has 400cc+ PVR.  To recheck today. 10.  History of back surgery with prior perineal numbness  I spoke to Dr. Trenton Gammon Friday, 3/20.  He said that he would be happy to see her in 1 to 2 weeks in his office.   Active Problems:   Rectal cancer, 12 - 15 cm from anal verge  Subjective:  Tolerating reg diet.  Still with perineal numbness.  Small BM and small flatus.  Husband in room.  Objective:   Filed Vitals:   09/08/13 0612  BP: 122/78  Pulse: 76  Temp: 98.1 F (36.7 C)  Resp: 18     Intake/Output from previous day:  03/21 0701 - 03/22 0700 In: 480 [P.O.:480] Out: 800 [Urine:800]  Intake/Output this shift:  Total I/O In: 240 [P.O.:240] Out: -    Physical Exam:   General: WN WF who is alert and oriented.    HEENT: Normal. Pupils equal. .   Lungs: Clear   Abdomen: Soft.   BS present.   Wound: Look good. Will get staples out of small wounds.   Lab Results:    No results found for this basename: WBC, HGB, HCT, PLT,  in the last 72 hours  BMET   No results found for this basename: NA, K, CL, CO2, GLUCOSE, BUN, CREATININE, CALCIUM,  in the last 72 hours  PT/INR  No results found for this basename: LABPROT, INR,  in the last 72  hours  ABG  No results found for this basename: PHART, PCO2, PO2, HCO3,  in the last 72 hours   Studies/Results:  No results found.   Anti-infectives:   Anti-infectives   Start     Dose/Rate Route Frequency Ordered Stop   09/02/13 2200  cefoTEtan (CEFOTAN) 2 g in dextrose 5 % 50 mL IVPB     2 g 100 mL/hr over 30 Minutes Intravenous Every 12 hours 09/02/13 1618 09/02/13 2215   09/02/13 0803  cefoTEtan (CEFOTAN) 2 g in dextrose 5 % 50 mL IVPB     2 g 100 mL/hr over 30 Minutes Intravenous On call to O.R. 09/02/13 0803 09/02/13 1011   09/02/13 0803  neomycin (MYCIFRADIN) tablet 1,000 mg  Status:  Discontinued     1,000 mg Oral 3 times per day 09/02/13 0803 09/02/13 0832   09/02/13 0803  erythromycin (E-MYCIN) tablet 1,000 mg  Status:  Discontinued     1,000 mg Oral 3 times per day 09/02/13 0803 09/02/13 0832      [name], MD, FACS Pager: [facility] Office: [phone] 09/08/2013

## 2013-09-08 NOTE — Progress Notes (Signed)
Post void residuals called to Dr Junious Silk. Instructed to continue to monitor PVR and that Dr Izola Price will see patient prior to discharge in am.

## 2013-09-09 ENCOUNTER — Other Ambulatory Visit: Payer: Self-pay | Admitting: *Deleted

## 2013-09-09 MED ORDER — HYDROCODONE-ACETAMINOPHEN 5-325 MG PO TABS: 1.0000 | ORAL_TABLET | Freq: Four times a day (QID) | ORAL | Status: DC | PRN

## 2013-09-09 NOTE — Discharge Summary (Signed)
Physician Discharge Summary  Patient ID:  [name]  MRN: [id number]  DOB/AGE: Nov 18, 1960 53 y.o.  Admit date: 09/02/2013 Discharge date: 09/09/2013  Discharge Diagnoses:  1. Lap assisted LAPAROSCOPIC LOW ANTERIOR RESECTION, appendectomy - 09/02/2013 - D. Magdelena Kinsella   She had neoadjuvant chemo/radiation therapy.  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 Margins clear.   2. DJD   Has seen Dr. Sherrian Divers  3. Anxiety  4. IUD - stuck   Sees Dr. Susette Racer  5. Indeterminate 6 mm liver lesion seen on CT scan  6. History of HELLP syndrome with first pregnancy  7. According to her husband, she does hear well without her hearing aids.  8. Urinary incontinence.   Dr. Chauncey Cruel. MacDiarmid's has seen. She has 400cc+ PVR.  9. History of back surgery with prior perineal numbness   I spoke to Dr. Trenton Gammon Friday, 3/20. He said that he would be happy to see her in 1 to 2 weeks in his office.   Active Problems:   Rectal cancer, 12 - 15 cm from anal verge   Operation: Procedure(s): LAPAROSCOPIC LOW ANTERIOR RESECTION on 09/02/2013 - D. [name]  Discharged Condition: good  Hospital Course: [name] is an 53 y.o. female whose primary care physician is Maisie Fus, MD and who was admitted 09/02/2013 with a chief complaint of rectal cancer.   She was brought to the operating room on 09/02/2013 and underwent  LAPAROSCOPIC LOW ANTERIOR RESECTION.   Post op she did well. Her foley was removed on the first post op day and her NGT was removed on the second post op day.  From early after removal of her foley, she complained of numbness around her perineum and difficulty telling when her bladder was full. Dr. Mikle Bosworth from urology saw her for me on 09/07/2013.  She is having post void residual of >400 cc.  She has had a small BM and passed some flatus. She is ready to go home.  I have removed some of her staples.  Will plan to remove the remainder of her staples later this week. Will  plan follow up with Dr. Matilde Sprang. She will also need a porta cath and plan to put that in next week.  The discharge instructions were reviewed with the patient.  Consults: urology  Significant Diagnostic Studies: Results for orders placed during the hospital encounter of 71/69/67  BASIC METABOLIC PANEL      Result Value Ref Range   Sodium 141  137 - 147 mEq/L   Potassium 4.7  3.7 - 5.3 mEq/L   Chloride 104  96 - 112 mEq/L   CO2 27  19 - 32 mEq/L   Glucose, Bld 150 (*) 70 - 99 mg/dL   BUN 7  6 - 23 mg/dL   Creatinine, Ser 0.75  0.50 - 1.10 mg/dL   Calcium 8.3 (*) 8.4 - 10.5 mg/dL   GFR calc non Af Amer >90  >90 mL/min   GFR calc Af Amer >90  >90 mL/min  CBC WITH DIFFERENTIAL      Result Value Ref Range   WBC 11.0 (*) 4.0 - 10.5 K/uL   RBC 3.75 (*) 3.87 - 5.11 MIL/uL   Hemoglobin 12.1  12.0 - 15.0 g/dL   HCT 36.0  36.0 - 46.0 %   MCV 96.0  78.0 - 100.0 fL   MCH 32.3  26.0 - 34.0 pg   MCHC 33.6  30.0 - 36.0 g/dL  RDW 14.3  11.5 - 15.5 %   Platelets 212  150 - 400 K/uL   Neutrophils Relative % 92 (*) 43 - 77 %   Neutro Abs 10.1 (*) 1.7 - 7.7 K/uL   Lymphocytes Relative 5 (*) 12 - 46 %   Lymphs Abs 0.5 (*) 0.7 - 4.0 K/uL   Monocytes Relative 4  3 - 12 %   Monocytes Absolute 0.4  0.1 - 1.0 K/uL   Eosinophils Relative 0  0 - 5 %   Eosinophils Absolute 0.0  0.0 - 0.7 K/uL   Basophils Relative 0  0 - 1 %   Basophils Absolute 0.0  0.0 - 0.1 K/uL  BASIC METABOLIC PANEL      Result Value Ref Range   Sodium 140  137 - 147 mEq/L   Potassium 4.1  3.7 - 5.3 mEq/L   Chloride 104  96 - 112 mEq/L   CO2 27  19 - 32 mEq/L   Glucose, Bld 110 (*) 70 - 99 mg/dL   BUN 5 (*) 6 - 23 mg/dL   Creatinine, Ser 0.65  0.50 - 1.10 mg/dL   Calcium 8.3 (*) 8.4 - 10.5 mg/dL   GFR calc non Af Amer >90  >90 mL/min   GFR calc Af Amer >90  >90 mL/min  CBC WITH DIFFERENTIAL      Result Value Ref Range   WBC 6.0  4.0 - 10.5 K/uL   RBC 3.45 (*) 3.87 - 5.11 MIL/uL   Hemoglobin 11.3 (*) 12.0 - 15.0  g/dL   HCT 34.6 (*) 36.0 - 46.0 %   MCV 100.3 (*) 78.0 - 100.0 fL   MCH 32.8  26.0 - 34.0 pg   MCHC 32.7  30.0 - 36.0 g/dL   RDW 14.9  11.5 - 15.5 %   Platelets 178  150 - 400 K/uL   Neutrophils Relative % 86 (*) 43 - 77 %   Lymphocytes Relative 6 (*) 12 - 46 %   Monocytes Relative 6  3 - 12 %   Eosinophils Relative 2  0 - 5 %   Basophils Relative 0  0 - 1 %   Neutro Abs 5.1  1.7 - 7.7 K/uL   Lymphs Abs 0.4 (*) 0.7 - 4.0 K/uL   Monocytes Absolute 0.4  0.1 - 1.0 K/uL   Eosinophils Absolute 0.1  0.0 - 0.7 K/uL   Basophils Absolute 0.0  0.0 - 0.1 K/uL   Smear Review MORPHOLOGY UNREMARKABLE    TYPE AND SCREEN      Result Value Ref Range   ABO/RH(D) O POS     Antibody Screen NEG     Sample Expiration 09/05/2013    ABO/RH      Result Value Ref Range   ABO/RH(D) O POS      No results found.  Discharge Exam:  Filed Vitals:   09/09/13 0551  BP: 101/68  Pulse: 76  Temp: 98.2 F (36.8 C)  Resp: 18    General: WN WF who is alert and generally healthy appearing.  Lungs: Clear to auscultation and symmetric breath sounds. Heart:  RRR. No murmur or rub. Abdomen: Soft. No mass. No tenderness.  Normal bowel sounds. Her incision looks good.  Discharge Medications:     Medication List         ALPRAZolam 0.5 MG tablet  Commonly known as:  XANAX  Take 0.5 mg by mouth at bedtime as needed for anxiety or sleep.  estradiol 0.05 MG/24HR patch  Commonly known as:  VIVELLE-DOT  Place 1 patch onto the skin 2 (two) times a week. Sundays and Wednesday     hydrochlorothiazide 25 MG tablet  Commonly known as:  HYDRODIURIL  Take 25 mg by mouth every morning.     HYDROcodone-acetaminophen 5-325 MG per tablet  Commonly known as:  NORCO/VICODIN  Take 1 tablet by mouth every 6 (six) hours as needed for moderate pain.     HYDROcodone-acetaminophen 5-325 MG per tablet  Commonly known as:  NORCO/VICODIN  Take 1-2 tablets by mouth every 6 (six) hours as needed.     naproxen sodium 220  MG tablet  Commonly known as:  ANAPROX  Take 220 mg by mouth 2 (two) times daily with a meal.     prochlorperazine 10 MG tablet  Commonly known as:  COMPAZINE  Take 10 mg by mouth every 6 (six) hours as needed for nausea or vomiting.     progesterone 200 MG capsule  Commonly known as:  PROMETRIUM  Take 200 mg by mouth. Every 3 months     sertraline 50 MG tablet  Commonly known as:  ZOLOFT  Take 50 mg by mouth every morning.     TESTOSTERONE IM  Inject into the muscle every 30 (thirty) days.        Disposition: 01-Home or Self Care      Discharge Orders   Future Appointments Provider Department Dept Phone   09/17/2013 11:45 AM Ladell Pier, MD [facility] [phone]   02/27/2014 1:00 PM Marye Round, [facility] [phone]   Future Orders Complete By Expires   Diet - low sodium heart healthy  As directed    Increase activity slowly  As directed        Activity:  Driving - May drive in 2 or 3 days, if doing well   Lifting - No lifting more than 15 pounds for 2 ot 3 weeks, then no limit  Wound Care:   May shower  Diet:  As tolerated,  Drink plenty of water/liquids  Follow up appointment:  Call Dr. Pollie Friar office [facility]) at [phone] for an appointment this week for staple removal and next week for appointment.  Plan to place porta cath in next couple of weeks.  Medications and dosages:  Resume your home medications.  You have a prescription for:  Vicodin    Signed: [name], M.D., [facility] Office:  [phone]  09/09/2013, 7:45 AM

## 2013-09-09 NOTE — Discharge Instructions (Signed)
[facility] - DISCHARGE INSTRUCTIONS TO PATIENT  Activity:  Driving - May drive in 2 or 3 days, if doing well   Lifting - No lifting more than 15 pounds for 2 ot 3 weeks, then no limit  Wound Care:   May shower  Diet:  As tolerated,  Drink plenty of water/liquids  Follow up appointment:  Call Dr. Pollie Friar office [facility]) at [phone] for an appointment this week for staple removal and next week for appointment  Medications and dosages:  Resume your home medications.  You have a prescription for:  Vicodin  Call Dr. [name] or his office  [phone]) if you have:  Temperature greater than 100.4,  Persistent nausea and vomiting,  Severe uncontrolled pain,  Redness, tenderness, or signs of infection (pain, swelling, redness, odor or green/yellow discharge around the site),  Difficulty breathing, headache or visual disturbances,  Any other questions or concerns you may have after discharge.  In an emergency, call 911 or go to an Emergency Department at a nearby hospital.

## 2013-09-10 ENCOUNTER — Other Ambulatory Visit: Payer: Self-pay | Admitting: Neurosurgery

## 2013-09-10 ENCOUNTER — Telehealth (INDEPENDENT_AMBULATORY_CARE_PROVIDER_SITE_OTHER): Payer: Self-pay | Admitting: General Surgery

## 2013-09-10 NOTE — Telephone Encounter (Signed)
[name] called because his wife, [name], had a fever of 101 degrees.  She is s/p LAR for cancer and had problems with incomplete emptying of her urinary bladder postop.  She saw a Dealer today and had an I & O cath done, 300 cc was drained.  She had the fever a few hours after that.  I suspect she may have a UTI as she reports that her incision is not red and she is otherwise doing well.  I advised her to take Advil for the fever and call the office in the morning if her temperature was still elevated.  The Urology center may have sent her urine off for analysis, but I am unable to determine that at this time.

## 2013-09-11 ENCOUNTER — Other Ambulatory Visit (INDEPENDENT_AMBULATORY_CARE_PROVIDER_SITE_OTHER): Payer: Self-pay | Admitting: Surgery

## 2013-09-11 ENCOUNTER — Ambulatory Visit (INDEPENDENT_AMBULATORY_CARE_PROVIDER_SITE_OTHER): Payer: 59 | Admitting: Surgery

## 2013-09-11 DIAGNOSIS — R209 Unspecified disturbances of skin sensation: Secondary | ICD-10-CM

## 2013-09-11 DIAGNOSIS — R5082 Postprocedural fever: Secondary | ICD-10-CM

## 2013-09-11 DIAGNOSIS — R200 Anesthesia of skin: Secondary | ICD-10-CM

## 2013-09-11 LAB — CBC WITH DIFFERENTIAL/PLATELET
Basophils Absolute: 0 10*3/uL (ref 0.0–0.1)
Basophils Relative: 0 % (ref 0–1)
Eosinophils Absolute: 0.2 10*3/uL (ref 0.0–0.7)
Eosinophils Relative: 3 % (ref 0–5)
HEMATOCRIT: 35.4 % — AB (ref 36.0–46.0)
Hemoglobin: 12.4 g/dL (ref 12.0–15.0)
LYMPHS PCT: 6 % — AB (ref 12–46)
Lymphs Abs: 0.5 10*3/uL — ABNORMAL LOW (ref 0.7–4.0)
MCH: 32.2 pg (ref 26.0–34.0)
MCHC: 35 g/dL (ref 30.0–36.0)
MCV: 91.9 fL (ref 78.0–100.0)
Monocytes Absolute: 0.4 10*3/uL (ref 0.1–1.0)
Monocytes Relative: 5 % (ref 3–12)
NEUTROS PCT: 86 % — AB (ref 43–77)
Neutro Abs: 6.6 10*3/uL (ref 1.7–7.7)
PLATELETS: 257 10*3/uL (ref 150–400)
RBC: 3.85 MIL/uL — ABNORMAL LOW (ref 3.87–5.11)
RDW: 14.8 % (ref 11.5–15.5)
WBC: 7.7 10*3/uL (ref 4.0–10.5)

## 2013-09-11 NOTE — Progress Notes (Signed)
[place], MD,  [name]  [place], [place]    [place] Phone:  [phone] FAX:  [phone]   Re:   [name] DOB:   Apr 11, 1961 MRN:   [id number]  ASSESSMENT AND PLAN: 1. Lap assisted LAPAROSCOPIC LOW ANTERIOR RESECTION, appendectomy - 09/02/2013 - D. Elverna Caffee   She had neoadjuvant chemo/radiation therapy.   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 Margins clear.   Has fever over last 24 hours.  Unclear what is causing this.  I empirically placed her on Septra DS (#14) - 1 tab BID  She has an appt with Dr. Trenton Gammon tomorrow.  I will check a CBC on her.  I'll see her back next week.  1A.  Power port  I discussed the indicaiton and the complications of the procedure with the patient and her husband.  The potential complication include bleeding, infection, pneumothorax,and thrombosis.  Will schedule in the next couple of weeks.   2. DJD   Has seen Dr. Sherrian Divers  3. Anxiety  4. IUD - stuck   Sees Dr. Susette Racer  5. Indeterminate 6 mm liver lesion seen on CT scan  6. History of HELLP syndrome with first pregnancy  7. According to her husband, she does hear well without her hearing aids.  8. Urinary incontinence.   Dr. Chauncey Cruel. MacDiarmid's has seen. She has 400cc+ PVR.  9. History of back surgery with prior perineal numbness   I spoke to Dr. Trenton Gammon Friday, 3/20. He said that he would be happy to see her in 1 to 2 weeks in his office.   HISTORY OF PRESENT ILLNESS: No chief complaint on file.   [name] is a 53 y.o. (DOB: Mar 18, 1961)  white  female who is a patient of NEAL,W RONALD, MD and comes to me today for post op visit.  She came for staple removal, but had several issues. Her temp last night went to 101.8.  Past Medical History  Diagnosis Date  . History of radiation therapy 06/18/13-07/26/13    rectal 50.4Gy total dose  . IUD (intrauterine device) in place 08-27-13    Mirena  Implant inplace  . Arthritis 08-27-13    at present has ruptured disc- lower back-not an issue now  . Colon cancer 08-27-13    radiation /chemo -last ended 4 weeks(Dr. Benay Spice)    SOCIAL HISTORY: Husband with patient  PHYSICAL EXAM: There were no vitals taken for this visit.  Temp - 99.4 Lungs: Clear Heart: RRR Abdomen:  Wound looks okay.  I removed all the staples.  There is no redness to the wound.  I probed the RLQ 5 mm port site - it has just some blood. Rectal:  There is no perineal inflammation.  She has sphincter tone, but just cannot feel much.  DATA REVIEWED: Epic notes  [name], MD,  [facility], [place] [address] [place] [address]  [place], [place]    [place] Phone:  [phone] FAX:  [phone]

## 2013-09-12 ENCOUNTER — Telehealth (INDEPENDENT_AMBULATORY_CARE_PROVIDER_SITE_OTHER): Payer: Self-pay | Admitting: Surgery

## 2013-09-12 NOTE — Telephone Encounter (Signed)
Talked to patient and husband.  She saw Dr. Trenton Gammon today.  He did an MRI - no evidence of metastatic cancer, but he cannot explain neuro symptoms.  She is still having fevers, though this morning was only 100.4.  She noticed some drainage from her midline wound.  She has the Septra that she has started taking.  I am going to write her for Vicodin (5/325), #30, that I will leave at the desk.  I have an appt to see her Monday, 3/30.  [name], MD, [facility] Pager: [phone] Office phone:  [phone]

## 2013-09-13 ENCOUNTER — Ambulatory Visit (HOSPITAL_COMMUNITY): Payer: 59 | Admitting: Anesthesiology

## 2013-09-13 ENCOUNTER — Encounter (HOSPITAL_COMMUNITY): Payer: 59 | Admitting: Anesthesiology

## 2013-09-13 ENCOUNTER — Encounter (HOSPITAL_COMMUNITY): Payer: Self-pay

## 2013-09-13 ENCOUNTER — Other Ambulatory Visit (INDEPENDENT_AMBULATORY_CARE_PROVIDER_SITE_OTHER): Payer: Self-pay

## 2013-09-13 ENCOUNTER — Encounter (HOSPITAL_COMMUNITY)
Admission: AD | Disposition: A | Discharge status: Home Health | Payer: Self-pay | Source: Ambulatory Visit | Attending: Surgery

## 2013-09-13 ENCOUNTER — Ambulatory Visit: Admit: 2013-09-13 | Payer: Self-pay | Admitting: Surgery

## 2013-09-13 ENCOUNTER — Inpatient Hospital Stay (HOSPITAL_COMMUNITY)
Admission: AD | Admit: 2013-09-13 | Discharge: 2013-09-24 | DRG: 329 | Disposition: A | Discharge status: Home Health | Payer: 59 | Source: Ambulatory Visit | Attending: Surgery | Admitting: Surgery

## 2013-09-13 ENCOUNTER — Other Ambulatory Visit (INDEPENDENT_AMBULATORY_CARE_PROVIDER_SITE_OTHER): Payer: Self-pay | Admitting: Surgery

## 2013-09-13 VITALS — BP 111/74 | HR 87 | Temp 99.0°F | Resp 18 | Ht 66.0 in | Wt 150.8 lb

## 2013-09-13 DIAGNOSIS — Z923 Personal history of irradiation: Secondary | ICD-10-CM

## 2013-09-13 DIAGNOSIS — T8140XA Infection following a procedure, unspecified, initial encounter: Secondary | ICD-10-CM | POA: Diagnosis present

## 2013-09-13 DIAGNOSIS — K929 Disease of digestive system, unspecified: Principal | ICD-10-CM | POA: Diagnosis present

## 2013-09-13 DIAGNOSIS — N731 Chronic parametritis and pelvic cellulitis: Secondary | ICD-10-CM | POA: Diagnosis present

## 2013-09-13 DIAGNOSIS — N739 Female pelvic inflammatory disease, unspecified: Secondary | ICD-10-CM | POA: Diagnosis present

## 2013-09-13 DIAGNOSIS — C20 Malignant neoplasm of rectum: Secondary | ICD-10-CM | POA: Diagnosis present

## 2013-09-13 DIAGNOSIS — K7689 Other specified diseases of liver: Secondary | ICD-10-CM | POA: Diagnosis present

## 2013-09-13 DIAGNOSIS — Y832 Surgical operation with anastomosis, bypass or graft as the cause of abnormal reaction of the patient, or of later complication, without mention of misadventure at the time of the procedure: Secondary | ICD-10-CM | POA: Diagnosis present

## 2013-09-13 DIAGNOSIS — R339 Retention of urine, unspecified: Secondary | ICD-10-CM | POA: Diagnosis not present

## 2013-09-13 DIAGNOSIS — R82998 Other abnormal findings in urine: Secondary | ICD-10-CM

## 2013-09-13 DIAGNOSIS — K769 Liver disease, unspecified: Secondary | ICD-10-CM

## 2013-09-13 DIAGNOSIS — K631 Perforation of intestine (nontraumatic): Secondary | ICD-10-CM | POA: Diagnosis present

## 2013-09-13 DIAGNOSIS — Z9049 Acquired absence of other specified parts of digestive tract: Secondary | ICD-10-CM

## 2013-09-13 DIAGNOSIS — Z85038 Personal history of other malignant neoplasm of large intestine: Secondary | ICD-10-CM

## 2013-09-13 DIAGNOSIS — R209 Unspecified disturbances of skin sensation: Secondary | ICD-10-CM | POA: Diagnosis present

## 2013-09-13 DIAGNOSIS — Z975 Presence of (intrauterine) contraceptive device: Secondary | ICD-10-CM

## 2013-09-13 DIAGNOSIS — F411 Generalized anxiety disorder: Secondary | ICD-10-CM | POA: Diagnosis present

## 2013-09-13 HISTORY — DX: Nausea with vomiting, unspecified: R11.2

## 2013-09-13 HISTORY — PX: LAPAROTOMY: SHX154

## 2013-09-13 HISTORY — DX: Other specified postprocedural states: Z98.890

## 2013-09-13 LAB — CBC WITH DIFFERENTIAL/PLATELET
BASOS ABS: 0 10*3/uL (ref 0.0–0.1)
Basophils Relative: 0 % (ref 0–1)
EOS ABS: 0.2 10*3/uL (ref 0.0–0.7)
Eosinophils Relative: 1 % (ref 0–5)
HCT: 32.8 % — ABNORMAL LOW (ref 36.0–46.0)
Hemoglobin: 11.7 g/dL — ABNORMAL LOW (ref 12.0–15.0)
LYMPHS ABS: 0.7 10*3/uL (ref 0.7–4.0)
LYMPHS PCT: 4 % — AB (ref 12–46)
MCH: 33 pg (ref 26.0–34.0)
MCHC: 35.7 g/dL (ref 30.0–36.0)
MCV: 92.4 fL (ref 78.0–100.0)
Monocytes Absolute: 0.8 10*3/uL (ref 0.1–1.0)
Monocytes Relative: 5 % (ref 3–12)
Neutro Abs: 15.1 10*3/uL — ABNORMAL HIGH (ref 1.7–7.7)
Neutrophils Relative %: 90 % — ABNORMAL HIGH (ref 43–77)
Platelets: 269 10*3/uL (ref 150–400)
RBC: 3.55 MIL/uL — ABNORMAL LOW (ref 3.87–5.11)
RDW: 13.2 % (ref 11.5–15.5)
WBC: 16.8 10*3/uL — ABNORMAL HIGH (ref 4.0–10.5)

## 2013-09-13 LAB — COMPREHENSIVE METABOLIC PANEL
ALT: 32 U/L (ref 0–35)
AST: 13 U/L (ref 0–37)
Albumin: 3.2 g/dL — ABNORMAL LOW (ref 3.5–5.2)
Alkaline Phosphatase: 89 U/L (ref 39–117)
BUN: 12 mg/dL (ref 6–23)
CALCIUM: 9.3 mg/dL (ref 8.4–10.5)
CO2: 21 mEq/L (ref 19–32)
Chloride: 94 mEq/L — ABNORMAL LOW (ref 96–112)
Creatinine, Ser: 1.06 mg/dL (ref 0.50–1.10)
GFR, EST AFRICAN AMERICAN: 70 mL/min — AB (ref >90)
GFR, EST NON AFRICAN AMERICAN: 60 mL/min — AB (ref >90)
GLUCOSE: 118 mg/dL — AB (ref 70–99)
Potassium: 3.6 mEq/L — ABNORMAL LOW (ref 3.7–5.3)
Sodium: 131 mEq/L — ABNORMAL LOW (ref 137–147)
Total Bilirubin: 0.5 mg/dL (ref 0.3–1.2)
Total Protein: 7.4 g/dL (ref 6.0–8.3)

## 2013-09-13 LAB — TYPE AND SCREEN
ABO/RH(D): O POS
ANTIBODY SCREEN: NEGATIVE

## 2013-09-13 SURGERY — LAPAROTOMY, EXPLORATORY
Anesthesia: General | Site: Abdomen

## 2013-09-13 MED ORDER — MIDAZOLAM HCL 5 MG/5ML IJ SOLN
INTRAMUSCULAR | Status: DC | PRN
  Administered 2013-09-13 (×2): 2 mg via INTRAVENOUS

## 2013-09-13 MED ORDER — ROCURONIUM BROMIDE 100 MG/10ML IV SOLN
INTRAVENOUS | Status: DC | PRN
  Administered 2013-09-13 (×2): 10 mg via INTRAVENOUS
  Administered 2013-09-13: 30 mg via INTRAVENOUS

## 2013-09-13 MED ORDER — SUCCINYLCHOLINE CHLORIDE 20 MG/ML IJ SOLN
INTRAMUSCULAR | Status: AC
  Filled 2013-09-13: qty 1

## 2013-09-13 MED ORDER — CHLORHEXIDINE GLUCONATE 4 % EX LIQD: 1.0000 "application " | Freq: Once | CUTANEOUS | Status: DC

## 2013-09-13 MED ORDER — PIPERACILLIN-TAZOBACTAM 3.375 G IVPB
INTRAVENOUS | Status: AC
  Filled 2013-09-13: qty 50

## 2013-09-13 MED ORDER — PROPOFOL 10 MG/ML IV BOLUS
INTRAVENOUS | Status: DC | PRN
  Administered 2013-09-13: 150 mg via INTRAVENOUS

## 2013-09-13 MED ORDER — ONDANSETRON HCL 4 MG/2ML IJ SOLN
INTRAMUSCULAR | Status: DC | PRN
  Administered 2013-09-13: 4 mg via INTRAVENOUS

## 2013-09-13 MED ORDER — HYDROMORPHONE HCL PF 1 MG/ML IJ SOLN
0.2500 mg | INTRAMUSCULAR | Status: DC | PRN
  Administered 2013-09-13 – 2013-09-14 (×4): 0.5 mg via INTRAVENOUS

## 2013-09-13 MED ORDER — PIPERACILLIN-TAZOBACTAM 3.375 G IVPB 30 MIN
3.3750 g | Freq: Once | INTRAVENOUS | Status: AC
  Administered 2013-09-13: 3.375 g via INTRAVENOUS
  Filled 2013-09-13: qty 50

## 2013-09-13 MED ORDER — SUCCINYLCHOLINE CHLORIDE 20 MG/ML IJ SOLN
INTRAMUSCULAR | Status: DC | PRN
  Administered 2013-09-13: 100 mg via INTRAVENOUS

## 2013-09-13 MED ORDER — SODIUM CHLORIDE 0.9 % IR SOLN
Status: DC | PRN
  Administered 2013-09-13: 6000 mL

## 2013-09-13 MED ORDER — FENTANYL CITRATE 0.05 MG/ML IJ SOLN
INTRAMUSCULAR | Status: DC | PRN
  Administered 2013-09-13 (×3): 50 ug via INTRAVENOUS
  Administered 2013-09-13: 100 ug via INTRAVENOUS

## 2013-09-13 MED ORDER — ONDANSETRON HCL 4 MG/2ML IJ SOLN
INTRAMUSCULAR | Status: AC
  Filled 2013-09-13: qty 2

## 2013-09-13 MED ORDER — CEFOTETAN DISODIUM-DEXTROSE 2-2.08 GM-% IV SOLR
INTRAVENOUS | Status: AC
  Filled 2013-09-13: qty 50

## 2013-09-13 MED ORDER — HYDROMORPHONE HCL PF 1 MG/ML IJ SOLN
INTRAMUSCULAR | Status: AC
  Filled 2013-09-13: qty 1

## 2013-09-13 MED ORDER — MIDAZOLAM HCL 2 MG/2ML IJ SOLN
INTRAMUSCULAR | Status: AC
  Filled 2013-09-13: qty 2

## 2013-09-13 MED ORDER — NEOSTIGMINE METHYLSULFATE 1 MG/ML IJ SOLN
INTRAMUSCULAR | Status: DC | PRN
  Administered 2013-09-13: 3.5 mg via INTRAVENOUS

## 2013-09-13 MED ORDER — HYDROMORPHONE HCL PF 1 MG/ML IJ SOLN
INTRAMUSCULAR | Status: AC
  Administered 2013-09-14: 1 mg
  Filled 2013-09-13: qty 1

## 2013-09-13 MED ORDER — FENTANYL CITRATE 0.05 MG/ML IJ SOLN
INTRAMUSCULAR | Status: AC
  Filled 2013-09-13: qty 5

## 2013-09-13 MED ORDER — PROPOFOL 10 MG/ML IV BOLUS
INTRAVENOUS | Status: AC
  Filled 2013-09-13: qty 20

## 2013-09-13 MED ORDER — LACTATED RINGERS IV SOLN
INTRAVENOUS | Status: DC | PRN
  Administered 2013-09-13 (×2): via INTRAVENOUS

## 2013-09-13 MED ORDER — GLYCOPYRROLATE 0.2 MG/ML IJ SOLN
INTRAMUSCULAR | Status: DC | PRN
  Administered 2013-09-13: .5 mg via INTRAVENOUS

## 2013-09-13 MED ORDER — ROCURONIUM BROMIDE 100 MG/10ML IV SOLN
INTRAVENOUS | Status: AC
  Filled 2013-09-13: qty 1

## 2013-09-13 MED ORDER — HYDROMORPHONE HCL PF 1 MG/ML IJ SOLN
INTRAMUSCULAR | Status: DC | PRN
  Administered 2013-09-13 (×2): 1 mg via INTRAVENOUS

## 2013-09-13 MED ORDER — PROMETHAZINE HCL 25 MG/ML IJ SOLN: 6.2500 mg | INTRAMUSCULAR | Status: DC | PRN

## 2013-09-13 MED ORDER — DEXAMETHASONE SODIUM PHOSPHATE 10 MG/ML IJ SOLN
INTRAMUSCULAR | Status: DC | PRN
  Administered 2013-09-13: 10 mg via INTRAVENOUS

## 2013-09-13 MED ORDER — LACTATED RINGERS IV SOLN: INTRAVENOUS | Status: DC

## 2013-09-13 MED ORDER — IOHEXOL 300 MG/ML  SOLN
100.0000 mL | Freq: Once | INTRAMUSCULAR | Status: AC | PRN
  Administered 2013-09-13: 100 mL via INTRAVENOUS

## 2013-09-13 MED ORDER — SCOPOLAMINE 1 MG/3DAYS TD PT72
MEDICATED_PATCH | TRANSDERMAL | Status: AC
  Filled 2013-09-13: qty 1

## 2013-09-13 MED ORDER — SCOPOLAMINE 1 MG/3DAYS TD PT72SCOPOLAMINE 1 MG/3DAYS
MEDICATED_PATCH | TRANSDERMAL | Status: DC | PRN
Start: 2013-09-13 — End: 2013-09-13
  Administered 2013-09-13: 1 via TRANSDERMAL

## 2013-09-13 MED ORDER — DEXAMETHASONE SODIUM PHOSPHATE 10 MG/ML IJ SOLN
INTRAMUSCULAR | Status: AC
  Filled 2013-09-13: qty 1

## 2013-09-13 MED ORDER — ACETAMINOPHEN 10 MG/ML IV SOLN
INTRAVENOUS | Status: DC | PRN
  Administered 2013-09-13: 1000 mg via INTRAVENOUS

## 2013-09-13 MED ORDER — HYDROMORPHONE HCL PF 2 MG/ML IJ SOLN
INTRAMUSCULAR | Status: AC
  Filled 2013-09-13: qty 1

## 2013-09-13 SURGICAL SUPPLY — 65 items
APPLICATOR COTTON TIP 6IN STRL (MISCELLANEOUS) ×3 IMPLANT
BARRIER SKIN 2 1/4 (WOUND CARE) ×2 IMPLANT
BARRIER SKIN 2 1/4INCH (WOUND CARE) ×1
BARRIER SKIN OD1.75 2 1/4 FLNG (WOUND CARE) IMPLANT
BLADE EXTENDED COATED 6.5IN (ELECTRODE) ×2 IMPLANT
BLADE HEX COATED 2.75 (ELECTRODE) ×3 IMPLANT
BRR SKN FLT 1.75X2.25 2 PC (WOUND CARE) ×1
CANISTER SUCTION 2500CC (MISCELLANEOUS) ×1 IMPLANT
CATH ROBINSON RED A/P 22FR (CATHETERS) ×2 IMPLANT
COVER MAYO STAND STRL (DRAPES) ×2 IMPLANT
DRAIN CHANNEL 19F RND (DRAIN) ×2 IMPLANT
DRAPE LAPAROSCOPIC ABDOMINAL (DRAPES) ×3 IMPLANT
DRAPE WARM FLUID 44X44 (DRAPE) ×2 IMPLANT
ELECT REM PT RETURN 9FT ADLT (ELECTROSURGICAL) ×3
ELECTRODE REM PT RTRN 9FT ADLT (ELECTROSURGICAL) ×1 IMPLANT
EVACUATOR DRAINAGE 10X20 100CC (DRAIN) IMPLANT
EVACUATOR SILICONE 100CC (DRAIN) ×3
GLOVE BIOGEL PI IND STRL 7.0 (GLOVE) ×1 IMPLANT
GLOVE BIOGEL PI IND STRL 8.5 (GLOVE) IMPLANT
GLOVE BIOGEL PI INDICATOR 7.0 (GLOVE) ×4
GLOVE BIOGEL PI INDICATOR 8.5 (GLOVE) ×2
GLOVE ECLIPSE 8.0 STRL XLNG CF (GLOVE) ×4 IMPLANT
GLOVE INDICATOR 8.0 STRL GRN (GLOVE) ×4 IMPLANT
GLOVE SURG SIGNA 7.5 PF LTX (GLOVE) ×5 IMPLANT
GLOVE SURG SS PI 8.5 STRL IVOR (GLOVE) ×2
GLOVE SURG SS PI 8.5 STRL STRW (GLOVE) IMPLANT
GOWN L4 XXLG W/PAP TWL (GOWN DISPOSABLE) ×2 IMPLANT
GOWN STRL REUS W/TWL LRG LVL3 (GOWN DISPOSABLE) ×3 IMPLANT
GOWN STRL REUS W/TWL XL LVL3 (GOWN DISPOSABLE) ×6 IMPLANT
KIT BASIN OR (CUSTOM PROCEDURE TRAY) ×3 IMPLANT
LEGGING LITHOTOMY PAIR STRL (DRAPES) ×2 IMPLANT
MANIFOLD NEPTUNE II (INSTRUMENTS) ×2 IMPLANT
NDL BLUNT 17GA (NEEDLE) IMPLANT
NEEDLE BLUNT 17GA (NEEDLE) ×3 IMPLANT
NS IRRIG 1000ML POUR BTL (IV SOLUTION) ×13 IMPLANT
PACK GENERAL/GYN (CUSTOM PROCEDURE TRAY) ×3 IMPLANT
PAD ABD 8X10 STRL (GAUZE/BANDAGES/DRESSINGS) ×2 IMPLANT
POUCH OSTOMY 2 PC DRNBL 2.25 (WOUND CARE) IMPLANT
POUCH OSTOMY DRNBL 2 1/4 (WOUND CARE) ×3
SPONGE GAUZE 4X4 12PLY (GAUZE/BANDAGES/DRESSINGS) ×3 IMPLANT
SPONGE LAP 18X18 X RAY DECT (DISPOSABLE) IMPLANT
STAPLER VISISTAT 35W (STAPLE) ×3 IMPLANT
SUCTION POOLE TIP (SUCTIONS) ×2 IMPLANT
SUT ETHILON 2 0 PS N (SUTURE) ×4 IMPLANT
SUT NOVA NAB DX-16 0-1 5-0 T12 (SUTURE) ×4 IMPLANT
SUT PDS AB 1 CTX 36 (SUTURE) ×2 IMPLANT
SUT SILK 2 0 (SUTURE) ×3
SUT SILK 2 0 SH CR/8 (SUTURE) ×2 IMPLANT
SUT SILK 2-0 18XBRD TIE 12 (SUTURE) IMPLANT
SUT SILK 3 0 (SUTURE) ×3
SUT SILK 3 0 SH CR/8 (SUTURE) ×2 IMPLANT
SUT SILK 3-0 18XBRD TIE 12 (SUTURE) IMPLANT
SUT VIC AB 2-0 SH 27 (SUTURE) ×3
SUT VIC AB 2-0 SH 27X BRD (SUTURE) IMPLANT
SUT VIC AB 3-0 SH 8-18 (SUTURE) ×2 IMPLANT
SUT VICRYL 2 0 18  UND BR (SUTURE) ×2
SUT VICRYL 2 0 18 UND BR (SUTURE) IMPLANT
SYR 50ML LL SCALE MARK (SYRINGE) ×2 IMPLANT
TAPE CLOTH SURG 4X10 WHT LF (GAUZE/BANDAGES/DRESSINGS) ×2 IMPLANT
TOWEL OR 17X26 10 PK STRL BLUE (TOWEL DISPOSABLE) ×6 IMPLANT
TRAY FOLEY CATH 14FRSI W/METER (CATHETERS) ×2 IMPLANT
TUBING ENDO SMARTCAP PENTAX (MISCELLANEOUS) ×2 IMPLANT
WATER STERILE IRR 1500ML POUR (IV SOLUTION) ×1 IMPLANT
WATER STERILE IRR 500ML POUR (IV SOLUTION) ×2 IMPLANT
YANKAUER SUCT BULB TIP NO VENT (SUCTIONS) ×2 IMPLANT

## 2013-09-13 NOTE — Anesthesia Preprocedure Evaluation (Signed)
Anesthesia Evaluation  Patient identified by MRN, date of birth, ID band Patient awake    Reviewed: Allergy & Precautions, H&P , NPO status , Patient's Chart, lab work & pertinent test results  Airway Mallampati: II TM Distance: >3 FB Neck ROM: Full    Dental no notable dental hx.    Pulmonary neg pulmonary ROS,  breath sounds clear to auscultation  Pulmonary exam normal       Cardiovascular negative cardio ROS  Rhythm:Regular Rate:Normal     Neuro/Psych negative neurological ROS  negative psych ROS   GI/Hepatic negative GI ROS, Neg liver ROS,   Endo/Other  negative endocrine ROS  Renal/GU negative Renal ROS  negative genitourinary   Musculoskeletal negative musculoskeletal ROS (+)   Abdominal   Peds negative pediatric ROS (+)  Hematology negative hematology ROS (+)   Anesthesia Other Findings   Reproductive/Obstetrics negative OB ROS                           Anesthesia Physical Anesthesia Plan  ASA: III and emergent  Anesthesia Plan: General   Post-op Pain Management:    Induction: Intravenous and Rapid sequence  Airway Management Planned:   Additional Equipment:   Intra-op Plan:   Post-operative Plan: Extubation in OR  Informed Consent: I have reviewed the patients History and Physical, chart, labs and discussed the procedure including the risks, benefits and alternatives for the proposed anesthesia with the patient or authorized representative who has indicated his/her understanding and acceptance.   Dental advisory given  Plan Discussed with: CRNA and Surgeon  Anesthesia Plan Comments:         Anesthesia Quick Evaluation

## 2013-09-13 NOTE — H&P (Signed)
  Renee Park, MD, [name]., [name], [name]  Phone: [phone] FAX: [phone]   Re: [name]  DOB: [birth year]  MRN: [id number]   ASSESSMENT AND PLAN:  1. Lap assisted LAPAROSCOPIC LOW ANTERIOR RESECTION, appendectomy - 09/02/2013 - [name]   She had neoadjuvant chemo/radiation therapy.   Dr. Jacinto Hebert. [name] and [name] have seen her for oncology.  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 Margins clear.    1A.  CT scan shows leak at the anastomosis  Discussed with patient and parents (and husband by phone).  To go to OR tonight for abdominal exploration and diverting ostomy.  2. DJD   Has seen Dr. Sherrian Hebert  3. Anxiety  4. IUD - stuck   Sees Dr. Susette Hebert  5. Indeterminate 6 mm liver lesion seen on CT scan  6. History of HELLP syndrome with first pregnancy  7. According to her husband, she does hear well without her hearing aids.  8. Urinary incontinence.  Dr. Chauncey Hebert. [name]'s has seen. She has 400cc+ PVR.  9. History of back surgery with prior perineal numbness   I spoke to Dr. Trenton Hebert Friday, 3/20. He said that he would be happy to see her in 1 to 2 weeks in his office.   HISTORY OF PRESENT ILLNESS:  [name] is a 53 y.o. (DOB: 1960/08/11) white female who is a patient of Renee RONALD, MD and comes to me today for post op visit.  She has continued to run fevers and not feel good over the last 3 days.  This prompted me to order a CT scan to evaluated her abdomen.   Past Medical History   Diagnosis  Date   .  History of radiation therapy  06/18/13-07/26/13     rectal 50.4Gy total dose   .  IUD (intrauterine device) in place  08-27-13     Mirena Implant inplace   .  Arthritis  08-27-13     at present has ruptured disc- lower back-not an issue now   .  Colon cancer  08-27-13     radiation /chemo -last ended 4 weeks(Dr. Benay Hebert)    SOCIAL HISTORY:  Parents with  patient. Husband in [place] returning.  PHYSICAL EXAM:  BP 105/83  Pulse 106  Temp(Src) 98.5 F (36.9 C) (Oral)  Resp 20  Ht 5\' 4"  (1.626 m)  Wt 146 lb 3.2 oz (66.316 kg)  BMI 25.08 kg/m2  SpO2 97%  General:  WN WF who does not look that sick. Lungs: Clear  Heart: RRR  Abdomen: Wound looks okay. I removed all the staples. There is no redness to the wound.    DATA REVIEWED:  Epic notes   Renee Overall, MD, [facility], [place]  [address] [place], [place]  Phone: [phone] FAX: [phone]

## 2013-09-13 NOTE — Transfer of Care (Signed)
Immediate Anesthesia Transfer of Care Note  Patient: [name]  Procedure(s) Performed: Procedure(s) (LRB): EXPLORATORY LAPAROTOMY / FLEXIBLE SIGMOIDOSCOPY/  DRAINAGE OF PELVIC ABSCESS WITH  DIVERTING  LOOP  ILEOSTOMY (N/A)  Patient Location: PACU  Anesthesia Type: General  Level of Consciousness: sedated, patient cooperative and responds to stimulation  Airway & Oxygen Therapy: Patient Spontanous Breathing and Patient connected to face mask oxgen  Post-op Assessment: Report given to PACU RN and Post -op Vital signs reviewed and stable  Post vital signs: Reviewed and stable  Complications: No apparent anesthesia complications

## 2013-09-13 NOTE — Preoperative (Signed)
Beta Blockers   Reason not to administer Beta Blockers:Not Applicable 

## 2013-09-13 NOTE — Brief Op Note (Signed)
09/13/2013  11:16 PM  PATIENT:  [name], 53 y.o., female, MRN: [id number]  PREOP DIAGNOSIS:  Leak at rectal anastomosis  POSTOP DIAGNOSIS:   Leak at rectal anastomosis, right posterior (approx 5% of lumen of anastomosis)  PROCEDURE:   Procedure(s): EXPLORATORY LAPAROTOMY / FLEXIBLE SIGMOIDOSCOPY/  DRAINAGE OF PELVIC ABSCESS WITH  DIVERTING  LOOP  ILEOSTOMY  SURGEON:   [name], M.D.  ASSISTANT:   Clyda Greener. M.D.  ANESTHESIA:   general  Anesthesiologist: Myrtie Soman, MD CRNA: Verlin Grills, CRNA  General  EBL:  75  ml  BLOOD ADMINISTERED: none  DRAINS: 48 F Blake in pelvis, behind rectum  LOCAL MEDICATIONS USED:   none  SPECIMEN:   none  COUNTS CORRECT:  YES  INDICATIONS FOR PROCEDURE:  [name] is a 53 y.o. (DOB: [birth year]) white  female whose primary care physician is NEAL,W RONALD, MD and comes for exploration for leak of rectal anastomosis.   The indications and risks of the surgery were explained to the patient.  The risks include, but are not limited to, infection, bleeding, and nerve injury.  Note dictated to:   [id number]  [name], MD, [facility] Surgery Pager: [phone] Office phone:  [phone]

## 2013-09-13 NOTE — Progress Notes (Signed)
Called Dr Kalman Shan and informed him that patient had contrast between 1400-1515. He states by the time patient goes to surgery, this will not be an issue.

## 2013-09-14 LAB — BASIC METABOLIC PANEL
BUN: 8 mg/dL (ref 6–23)
CO2: 21 mEq/L (ref 19–32)
Calcium: 9.1 mg/dL (ref 8.4–10.5)
Chloride: 102 mEq/L (ref 96–112)
Creatinine, Ser: 0.7 mg/dL (ref 0.50–1.10)
Glucose, Bld: 225 mg/dL — ABNORMAL HIGH (ref 70–99)
POTASSIUM: 4.6 meq/L (ref 3.7–5.3)
Sodium: 136 mEq/L — ABNORMAL LOW (ref 137–147)

## 2013-09-14 LAB — CBC
HCT: 33.9 % — ABNORMAL LOW (ref 36.0–46.0)
HEMOGLOBIN: 11.6 g/dL — AB (ref 12.0–15.0)
MCH: 32.2 pg (ref 26.0–34.0)
MCHC: 34.2 g/dL (ref 30.0–36.0)
MCV: 94.2 fL (ref 78.0–100.0)
Platelets: 275 10*3/uL (ref 150–400)
RBC: 3.6 MIL/uL — ABNORMAL LOW (ref 3.87–5.11)
RDW: 13.3 % (ref 11.5–15.5)
WBC: 12.8 10*3/uL — ABNORMAL HIGH (ref 4.0–10.5)

## 2013-09-14 LAB — MRSA PCR SCREENING: MRSA BY PCR: NEGATIVE

## 2013-09-14 MED ORDER — ONDANSETRON HCL 4 MG PO TABS: 4.0000 mg | ORAL_TABLET | Freq: Four times a day (QID) | ORAL | Status: DC | PRN

## 2013-09-14 MED ORDER — PIPERACILLIN-TAZOBACTAM 3.375 G IVPB 30 MIN: 3.3750 g | Freq: Once | INTRAVENOUS | Status: DC

## 2013-09-14 MED ORDER — HYDROMORPHONE HCL PF 1 MG/ML IJ SOLN
INTRAMUSCULAR | Status: AC
  Filled 2013-09-14: qty 1

## 2013-09-14 MED ORDER — ONDANSETRON HCL 4 MG/2ML IJ SOLN
4.0000 mg | Freq: Four times a day (QID) | INTRAMUSCULAR | Status: DC | PRN
  Administered 2013-09-15 (×2): 4 mg via INTRAVENOUS
  Filled 2013-09-14 (×2): qty 2

## 2013-09-14 MED ORDER — MORPHINE SULFATE 2 MG/ML IJ SOLN
1.0000 mg | INTRAMUSCULAR | Status: DC | PRN
  Administered 2013-09-14: 2 mg via INTRAVENOUS
  Administered 2013-09-14 (×2): 4 mg via INTRAVENOUS
  Administered 2013-09-14: 2 mg via INTRAVENOUS
  Administered 2013-09-14: 4 mg via INTRAVENOUS
  Administered 2013-09-14 (×2): 2 mg via INTRAVENOUS
  Administered 2013-09-14: 4 mg via INTRAVENOUS
  Administered 2013-09-14: 2 mg via INTRAVENOUS
  Administered 2013-09-15 (×9): 4 mg via INTRAVENOUS
  Filled 2013-09-14 (×2): qty 1
  Filled 2013-09-14 (×2): qty 2
  Filled 2013-09-14: qty 1
  Filled 2013-09-14 (×4): qty 2
  Filled 2013-09-14: qty 1
  Filled 2013-09-14 (×7): qty 2
  Filled 2013-09-14: qty 1

## 2013-09-14 MED ORDER — HEPARIN SODIUM (PORCINE) 5000 UNIT/ML IJ SOLN
5000.0000 [IU] | Freq: Three times a day (TID) | INTRAMUSCULAR | Status: DC
  Administered 2013-09-14 – 2013-09-24 (×31): 5000 [IU] via SUBCUTANEOUS
  Filled 2013-09-14 (×34): qty 1

## 2013-09-14 MED ORDER — KCL IN DEXTROSE-NACL 20-5-0.45 MEQ/L-%-% IV SOLN
INTRAVENOUS | Status: DC
  Administered 2013-09-14: 23:00:00 via INTRAVENOUS
  Administered 2013-09-14: 125 mL via INTRAVENOUS
  Administered 2013-09-14 – 2013-09-18 (×11): via INTRAVENOUS
  Filled 2013-09-14 (×14): qty 1000

## 2013-09-14 MED ORDER — PIPERACILLIN-TAZOBACTAM 3.375 G IVPB
3.3750 g | Freq: Three times a day (TID) | INTRAVENOUS | Status: DC
  Administered 2013-09-14 – 2013-09-24 (×31): 3.375 g via INTRAVENOUS
  Filled 2013-09-14 (×33): qty 50

## 2013-09-14 MED ORDER — HYDROCODONE-ACETAMINOPHEN 5-325 MG PO TABS
1.0000 | ORAL_TABLET | ORAL | Status: DC | PRN
  Administered 2013-09-14 – 2013-09-19 (×13): 2 via ORAL
  Filled 2013-09-14 (×13): qty 2

## 2013-09-14 NOTE — Progress Notes (Signed)
POD#1 visit  S:  Doing okay, except lower abdominal pain  BP 101/56  Pulse 65  Temp(Src) 98.4 F (36.9 C) (Oral)  Resp 15  Ht 5\' 6"  (1.676 m)  Wt 150 lb 12.7 oz (68.4 kg)  BMI 24.35 kg/m2  SpO2 97%  Abd:  Dressing dry.  Ostomy okay.  LLQ drain (to pelvis) - only 45 cc today.  A&P:  Leak at rectal anastomosis - diverting ileostomy - 09/13/2013 - [name]  Some confusion in orders.  To change dressing q shift.  Needs to ambulate.  Otherwise doing okay.  [name], MD, [facility] Pager: [phone] Office phone:  [phone]

## 2013-09-14 NOTE — Progress Notes (Signed)
1 Day Post-Op  Subjective: comfortable  Objective: Vital signs in last 24 hours: Temp:  [98.5 F (36.9 C)-99.1 F (37.3 C)] 98.6 F (37 C) (03/28 0400) Pulse Rate:  [84-106] 93 (03/28 0600) Resp:  [10-20] 15 (03/28 0600) BP: (104-145)/(59-83) 105/63 mmHg (03/28 0600) SpO2:  [94 %-100 %] 96 % (03/28 0600) Weight:  [146 lb 3.2 oz (66.316 kg)-150 lb 12.7 oz (68.4 kg)] 150 lb 12.7 oz (68.4 kg) (03/28 0045)    Intake/Output from previous day: 03/27 0701 - 03/28 0700 In: 2175 [I.V.:2150; IV Piggyback:25] Out: 1965 [Urine:1840; Drains:50; Blood:75] Intake/Output this shift:    Lungs clear Abdomen soft, dressing intact, ostomy viable  Lab Results:   Recent Labs  09/13/13 1745 09/14/13 0325  WBC 16.8* 12.8*  HGB 11.7* 11.6*  HCT 32.8* 33.9*  PLT 269 275   BMET  Recent Labs  09/13/13 1745 09/14/13 0325  NA 131* 136*  K 3.6* 4.6  CL 94* 102  CO2 21 21  GLUCOSE 118* 225*  BUN 12 8  CREATININE 1.06 0.70  CALCIUM 9.3 9.1   PT/INR No results found for this basename: LABPROT, INR,  in the last 72 hours ABG No results found for this basename: PHART, PCO2, PO2, HCO3,  in the last 72 hours  Studies/Results: Ct Abdomen Pelvis W Contrast  09/13/2013   CLINICAL DATA:  Fever and status post colonic resection. Evaluate for leak.  EXAM: CT ABDOMEN AND PELVIS WITH CONTRAST  TECHNIQUE: Multidetector CT imaging of the abdomen and pelvis was performed using the standard protocol following bolus administration of intravenous contrast.  CONTRAST:  128mL OMNIPAQUE IOHEXOL 300 MG/ML SOLN an 05/28/2013 again noted are small sub cm low-density structures in the liver which are indeterminate. Portal venous system is patent. Normal appearance of the spleen pancreas and adrenal glands. Normal appearance of both kidneys incidentally, there are parapelvic cysts, left side greater than right. There is edema in the lower abdominal mesentery.  No acute bone abnormality.  COMPARISON:  05/28/2013   FINDINGS: Bibasilar linear densities are suggestive for atelectasis. No evidence for pleural effusions. There is free air underneath the right hemidiaphragm and along the anterior abdomen. There is extraluminal gas and oral contrast in the presacral space. This air-contrast collection measures 4.5 x 2.7 x 6.3 cm. This is best seen on sequence 2, image 67. Findings are consistent with Hebert bowel leak. Patient has Hebert distal colonic anastomosis. There is wall thickening of the colon just proximal to the anastomosis and there is an anastomotic leak on sequence 2, image 71. There is distention of the right colon but no evidence for Hebert high-grade obstruction. There is additional fluid and thickening in the presacral region of the pelvis. The air-contrast collection is connected to small pockets of gas that involve the right adnexa. There is gas in the right adnexa and there is an air-fluid level in the endometrial cavity. The patient has an IUD in the uterus.  Again noted are small sub cm low-density structures in the liver which are indeterminate. Portal venous system is patent. Normal appearance of the spleen, pancreas and adrenal glands. Normal appearance of both kidneys, incidentally, there are parapelvic cyst. There is edema in the lower abdominal mesentery. No acute bone abnormality.  IMPRESSION: Study is positive for Hebert bowel leak at the distal colonic anastomosis. There is Hebert collection of oral contrast and gas in the presacral region. Gas extends into the right adnexa and this explains the gas within the endometrial cavity. There is an  air-fluid level within the endometrial cavity. Free air in the anterior abdomen is consistent with the bowel perforation.  Again noted are small low-density structures in the liver that have not significantly changed. These are indeterminate and described on prior MRI from 06/17/2013.  These results were called by telephone at the time of interpretation on 09/13/2013 at 5:00 PM to Dr.  [name] , who verbally acknowledged these results.   Electronically Signed   By: Markus Daft M.D.   On: 09/13/2013 17:04    Anti-infectives: Anti-infectives   Start     Dose/Rate Route Frequency Ordered Stop   09/14/13 0500  piperacillin-tazobactam (ZOSYN) IVPB 3.375 g     3.375 g 12.5 mL/hr over 240 Minutes Intravenous Every 8 hours 09/14/13 0054     09/14/13 0100  piperacillin-tazobactam (ZOSYN) IVPB 3.375 g  Status:  Discontinued     3.375 g 100 mL/hr over 30 Minutes Intravenous  Once 09/14/13 0054 09/14/13 0058   09/13/13 2130  piperacillin-tazobactam (ZOSYN) IVPB 3.375 g     3.375 g 100 mL/hr over 30 Minutes Intravenous  Once 09/13/13 2127 09/13/13 2118      Assessment/Plan: s/p Procedure(s): EXPLORATORY LAPAROTOMY / FLEXIBLE SIGMOIDOSCOPY/  DRAINAGE OF PELVIC ABSCESS WITH  DIVERTING  LOOP  ILEOSTOMY (N/Hebert)  Continue ICU care IV antibiotics Leave foley Continue NPO  LOS: 1 day    [name] 09/14/2013

## 2013-09-14 NOTE — Anesthesia Postprocedure Evaluation (Signed)
  Anesthesia Post-op Note  Patient: [name]  Procedure(s) Performed: Procedure(s) (LRB): EXPLORATORY LAPAROTOMY / FLEXIBLE SIGMOIDOSCOPY/  DRAINAGE OF PELVIC ABSCESS WITH  DIVERTING  LOOP  ILEOSTOMY (N/A)  Patient Location: PACU  Anesthesia Type: General  Level of Consciousness: awake and alert   Airway and Oxygen Therapy: Patient Spontanous Breathing  Post-op Pain: mild  Post-op Assessment: Post-op Vital signs reviewed, Patient's Cardiovascular Status Stable, Respiratory Function Stable, Patent Airway and No signs of Nausea or vomiting  Last Vitals:  Filed Vitals:   09/14/13 0400  BP: 107/62  Pulse: 84  Temp: 37 C  Resp: 12    Post-op Vital Signs: stable   Complications: No apparent anesthesia complications

## 2013-09-14 NOTE — Op Note (Signed)
[name], [name] NO.:  [id number]  MEDICAL RECORD NO.:  [id number]  LOCATION:  5573                         FACILITY:  [facility]  PHYSICIAN:  Fenton Malling. [name], M.D.  DATE OF BIRTH:  Oct 14, 1960  DATE OF PROCEDURE:  09/13/2013                              OPERATIVE REPORT   PREOPERATIVE DIAGNOSIS:  Leak at rectal anastomosis.  POSTOPERATIVE DIAGNOSIS:  Leak at rectal anastomosis (approx 8 cm from anal verge), right posterior involving about approximately 5% in the lumen of the anastomosis.  PROCEDURE:  Exploratory laparotomy with drainage of pelvic abscess, flexible sigmoidoscopy with diverting loop ileostomy.  SURGEON:  Fenton Malling. [name], M.D.  FIRST ASSISTANT:  Adin Hector, MD  ANESTHESIA:  General endotracheal.  ESTIMATED BLOOD LOSS:  75 mL.  DRAINS:  Left in was a 68 Blake drain in the pelvis behind the rectum.  INDICATION FOR PROCEDURE:  Ms. 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  She underwent neoadjuvant chemoradiation, supervised by Dr. [name] and [name].  Then on the September 02, 2013, she underwent a low anterior resection for this rectal cancer.  Her anastomosis was approximately 8 cm from the anal verge.  She was discharged home after 1 week, but then about 3 days ago started developing fevers and got progressively feeling more poorly.  A CT scan today showed a collection of oral contrast and gas in the presacral area.  The gas went up along her right fallopian tube.  This is consistent with a leak at the rectal anastomosis.  I discussed with the patient and her family about proceeding with surgery to evacuate this leak and divert her fecal stream, either with a colostomy or ileostomy, away from the rectum.  The risks of surgery include, but not limited to, bleeding, nerve injury, I think she already has an infection.  She actually already has some peroneal nerve changes after her  first operation.  OPERATIVE NOTE:  The patient take to room #1 at the [facility].  She was placed in lithotomy position, had a Foley catheter in place.  She had 2 IVs in place, was given Zosyn at the initiation of operation.  Her abdomen was prepped with ChloraPrep.  Her perineum prepped with Betadine and sterilely draped.  A time-out was held and surgical checklist run.  I went through her previous midline incision which looked good and entered the abdominal cavity.  She had no gross contamination in her abdominal cavity, the abscess was limited to her pelvis.  This was walled off by the right fallopian tube, which had pus tracking up in it.  So I think the fallopian tube was struck to the abscess and some loop of small bowel was stuck down also.  She had a cavity that went behind the rectum probably 6 or 7 cm in size.  I took cultures of this cavity, but it was clearly contaminated with stool.  We irrigated about 4 L of saline.  The colon coming down to the anastomosis look healthy.  We are able to look at the staple line anteriorly.  The distal rectum looked for what we could  see  looked healthy.  At this point, Dr. Johney Maine broke scrub and did a flexible sigmoidoscopy.  He passed the scope through the anastomosis and then slowly pulled the scope back, we saw bubbling coming from right posterior behind the anastomosis.  We were able to see what we thought was probably the breakdown area, which was sort of the right posterior aspect of the anastomosis representing less than 5% with a widely patent anastomosis. The mucosa looked healthy, so the hope is that with diverting this, the rectal area will seal.  After irrigating made sure the wound was cleaned well.  I then brought a 19-French Keenan Bachelor drain out through the left lower quadrant and placed this in the space behind the rectum.  I then went back about 15-20 cm proximal to the terminal ileum and brought up a loop ileostomy in the right lower  quadrant.  I tried to go to the area that I imagined where she had been marked preoperatively.  I used a 22 red rubber catheter to hold up the loop ileostomy.  I closed the peritoneum with a running 2-0 Vicryl suture.  I closed the fascia with interrupted #1 Novafil sutures, but I left the wound open and packed with saline gauze.  After the abdomen was closed, I then matured the ileostomy.  I made an incision about 2 cm into the small bowel and sewed the small bowel back to the skin with 3-0 vicryl.  The mucosa looked pink.    The patient tolerated the procedure well, was transported to the recovery room in good condition.  Sponge and needle count were good, and then she was transferred to the recovery room.  I plan to keep her in step-down overnight.  Fenton Malling. [name], M.D., FACS   DHN/MEDQ  D:  09/13/2013  T:  09/14/2013  Job:  [id number]  cc:   Henry A. Pool, M.D. Fax: [phone]  Reece Packer, MD Fax: [phone]  Jodelle Gross, M.D., Ph.D. Fax: [phone]  Mayme Genta, M.D. Fax: [phone]  W. Evette Cristal, M.D. Fax: [phone]  Dr. Geanie Cooley

## 2013-09-15 MED ORDER — ALUM & MAG HYDROXIDE-SIMETH 200-200-20 MG/5ML PO SUSP
30.0000 mL | Freq: Four times a day (QID) | ORAL | Status: DC | PRN
  Administered 2013-09-16 – 2013-09-19 (×8): 30 mL via ORAL
  Filled 2013-09-15 (×8): qty 30

## 2013-09-15 MED ORDER — PROMETHAZINE HCL 25 MG/ML IJ SOLN: 12.5000 mg | Freq: Four times a day (QID) | INTRAMUSCULAR | Status: DC | PRN

## 2013-09-15 MED ORDER — DIPHENHYDRAMINE HCL 50 MG/ML IJ SOLN: 12.5000 mg | Freq: Four times a day (QID) | INTRAMUSCULAR | Status: DC | PRN

## 2013-09-15 MED ORDER — HYDROMORPHONE HCL PF 1 MG/ML IJ SOLN
0.5000 mg | INTRAMUSCULAR | Status: DC | PRN
  Administered 2013-09-15: 1 mg via INTRAVENOUS
  Administered 2013-09-15: 0.5 mg via INTRAVENOUS
  Administered 2013-09-16 (×3): 1 mg via INTRAVENOUS
  Administered 2013-09-16 (×4): 2 mg via INTRAVENOUS
  Administered 2013-09-16 (×5): 1 mg via INTRAVENOUS
  Administered 2013-09-16 – 2013-09-18 (×12): 2 mg via INTRAVENOUS
  Administered 2013-09-18: 1 mg via INTRAVENOUS
  Administered 2013-09-18 (×3): 2 mg via INTRAVENOUS
  Administered 2013-09-18 – 2013-09-19 (×3): 1 mg via INTRAVENOUS
  Administered 2013-09-19: 2 mg via INTRAVENOUS
  Administered 2013-09-19 (×3): 1 mg via INTRAVENOUS
  Administered 2013-09-19: 2 mg via INTRAVENOUS
  Administered 2013-09-20 – 2013-09-21 (×6): 1 mg via INTRAVENOUS
  Administered 2013-09-21: 0.5 mg via INTRAVENOUS
  Administered 2013-09-21: 1 mg via INTRAVENOUS
  Administered 2013-09-22 – 2013-09-23 (×3): 0.5 mg via INTRAVENOUS
  Filled 2013-09-15: qty 2
  Filled 2013-09-15: qty 1
  Filled 2013-09-15: qty 2
  Filled 2013-09-15: qty 1
  Filled 2013-09-15: qty 2
  Filled 2013-09-15 (×4): qty 1
  Filled 2013-09-15: qty 2
  Filled 2013-09-15: qty 1
  Filled 2013-09-15: qty 2
  Filled 2013-09-15: qty 1
  Filled 2013-09-15 (×3): qty 2
  Filled 2013-09-15: qty 1
  Filled 2013-09-15 (×2): qty 2
  Filled 2013-09-15: qty 1
  Filled 2013-09-15 (×2): qty 2
  Filled 2013-09-15 (×2): qty 1
  Filled 2013-09-15 (×3): qty 2
  Filled 2013-09-15 (×10): qty 1
  Filled 2013-09-15 (×2): qty 2
  Filled 2013-09-15: qty 1
  Filled 2013-09-15: qty 2
  Filled 2013-09-15 (×2): qty 1
  Filled 2013-09-15: qty 2
  Filled 2013-09-15 (×2): qty 1
  Filled 2013-09-15: qty 2
  Filled 2013-09-15: qty 1
  Filled 2013-09-15: qty 2

## 2013-09-15 MED ORDER — ACETAMINOPHEN 325 MG PO TABS: 325.0000 mg | ORAL_TABLET | Freq: Four times a day (QID) | ORAL | Status: DC | PRN

## 2013-09-15 MED ORDER — ZOLPIDEM TARTRATE 5 MG PO TABS
5.0000 mg | ORAL_TABLET | Freq: Every evening | ORAL | Status: DC | PRN
  Administered 2013-09-16 – 2013-09-20 (×5): 5 mg via ORAL
  Filled 2013-09-15 (×5): qty 1

## 2013-09-15 NOTE — Progress Notes (Signed)
INITIAL NUTRITION ASSESSMENT  DOCUMENTATION CODES Per approved criteria  -Not Applicable   INTERVENTION: -Monitor diet advancement. -Add supplements when diet advanced.   NUTRITION DIAGNOSIS: Inadequate oral intake related to altered GI status as evidenced by clear liquid diet and recent GI surgery..   Goal: Tolerate diet advancement with intake >75% of estimated needs.  Monitor:  Diet advancement, tolerance, intake, labs, weight trend.  Reason for Assessment: MST  53 y.o. female  Admitting Dx: <principal problem not specified>  ASSESSMENT: Patient s/p LAPAROSCOPIC LOW ANTERIOR RESECTION, appendectomy - 09/02/2013 - D. Newman  She had neoadjuvant chemo/radiation therapy. Dr. Jacinto Reap. Benay Spice and Melina Modena have seen her for oncology.  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 Margins clear.   Leak at the anastomosis and admitted now s/p EXPLORATORY LAPAROTOMY / FLEXIBLE SIGMOIDOSCOPY/ DRAINAGE OF PELVIC ABSCESS WITH DIVERTING LOOP ILEOSTOMY.  Spoke with patient who reports tolerating small amounts of clear liquids post op.  States that her intake last week was fair and prior to surgery 2 weeks ago was good.  Tried to eat a healthy diet.  Patient has been seen by the [place] [facility] 06/11/13.  Weight overall appears stable to her usual pre op weight.   Height: Ht Readings from Last 1 Encounters:  09/14/13 5\' 6"  (1.676 m)    Weight: Wt Readings from Last 1 Encounters:  09/14/13 150 lb 12.7 oz (68.4 kg)    Ideal Body Weight: 130 lbs  % Ideal Body Weight:115  Wt Readings from Last 10 Encounters:  09/14/13 150 lb 12.7 oz (68.4 kg)  09/14/13 150 lb 12.7 oz (68.4 kg)  09/02/13 165 lb 5.5 oz (75 kg)  09/02/13 165 lb 5.5 oz (75 kg)  08/29/13 154 lb 4.8 oz (69.99 kg)  08/27/13 151 lb 6 oz (68.663 kg)  08/19/13 150 lb 12.8 oz (68.402 kg)  08/14/13 149 lb (67.586 kg)  07/26/13 146 lb 9.6 oz (66.497 kg)  07/19/13 153 lb (69.4 kg)    Usual  Body Weight: 146-150 lbs  % Usual Body Weight: 100  BMI:  Body mass index is 24.35 kg/(m^2).  Estimated Nutritional Needs: Kcal:1800-2000  Protein: 85-95 bm Fluid: 1.8-2L daily  Skin: incision  Diet Order: NPO- "May have small sips of water, juice, or soda."  EDUCATION NEEDS: -Education needs addressed   Intake/Output Summary (Last 24 hours) at 09/15/13 0912 Last data filed at 09/15/13 0800  Gross per 24 hour  Intake   3150 ml  Output   1820 ml  Net   1330 ml    Last BM: unknown   Labs:   Recent Labs Lab 09/13/13 1745 09/14/13 0325  NA 131* 136*  K 3.6* 4.6  CL 94* 102  CO2 21 21  BUN 12 8  CREATININE 1.06 0.70  CALCIUM 9.3 9.1  GLUCOSE 118* 225*    CBG (last 3)  No results found for this basename: GLUCAP,  in the last 72 hours  Scheduled Meds: . heparin subcutaneous  5,000 Units Subcutaneous 3 times per day  . piperacillin-tazobactam (ZOSYN)  IV  3.375 g Intravenous Q8H    Continuous Infusions: . dextrose 5 % and 0.45 % NaCl with KCl 20 mEq/L 125 mL/hr at 09/15/13 0900    Past Medical History  Diagnosis Date  . History of radiation therapy 06/18/13-07/26/13    rectal 50.4Gy total dose  . IUD (intrauterine device) in place 08-27-13    Mirena Implant inplace  . Arthritis 08-27-13  at present has ruptured disc- lower back-not an issue now  . Colon cancer 08-27-13    radiation /chemo -last ended 4 weeks(Dr. Benay Spice)  . PONV (postoperative nausea and vomiting)     scopalamine patch helped    Past Surgical History  Procedure Laterality Date  . Back surgery    . Cesarean section      x2  . Cervical disc arthroplasty  01/03/2012    Procedure: CERVICAL ANTERIOR DISC ARTHROPLASTY;  Surgeon: Charlie Pitter, MD;  Location: [facility];  Service: Neurosurgery;  Laterality: N/A;  Cervical Anterior Disc Arthorplasty Five-Six  . Eus N/A 05/31/2013    Procedure: LOWER ENDOSCOPIC ULTRASOUND (EUS);  Surgeon: Beryle Beams, MD;  Location: [facility];   Service: Endoscopy;  Laterality: N/A;  . Intrauterine device insertion  2006    [facility]  . Knee arthroscopy Left 08-27-13    torn meniscus repair  . Breast surgery      reduction surgery  . Refractive surgery    . Laparoscopic low anterior resection N/A 09/02/2013    Procedure: LAPAROSCOPIC LOW ANTERIOR RESECTION;  Surgeon: [name], MD;  Location: [facility];  Service: General;  Laterality: N/A;    Antonieta Iba, RD, LDN Clinical Inpatient Dietitian Pager:  [phone] Weekend and after hours pager:  [phone]

## 2013-09-15 NOTE — Progress Notes (Signed)
2 Days Post-Op  Subjective: Having incisional pain Denies nausea  Objective: Vital signs in last 24 hours: Temp:  [97.6 F (36.4 C)-98.7 F (37.1 C)] 98.7 F (37.1 C) (03/29 0400) Pulse Rate:  [65-85] 85 (03/29 0600) Resp:  [11-20] 18 (03/29 0600) BP: (95-111)/(55-84) 108/67 mmHg (03/29 0600) SpO2:  [92 %-97 %] 94 % (03/29 0600)    Intake/Output from previous day: 03/28 0701 - 03/29 0700 In: 3100 [I.V.:3000; IV Piggyback:100] Out: 1845 [Urine:1730; Drains:100; Stool:15] Intake/Output this shift:    Abdomen soft, wound clean, ostomy pink, nothing in the bag Drain serosang  Lab Results:   Recent Labs  09/13/13 1745 09/14/13 0325  WBC 16.8* 12.8*  HGB 11.7* 11.6*  HCT 32.8* 33.9*  PLT 269 275   BMET  Recent Labs  09/13/13 1745 09/14/13 0325  NA 131* 136*  K 3.6* 4.6  CL 94* 102  CO2 21 21  GLUCOSE 118* 225*  BUN 12 8  CREATININE 1.06 0.70  CALCIUM 9.3 9.1   PT/INR No results found for this basename: LABPROT, INR,  in the last 72 hours ABG No results found for this basename: PHART, PCO2, PO2, HCO3,  in the last 72 hours  Studies/Results: Ct Abdomen Pelvis W Contrast  09/13/2013   CLINICAL DATA:  Fever and status post colonic resection. Evaluate for leak.  EXAM: CT ABDOMEN AND PELVIS WITH CONTRAST  TECHNIQUE: Multidetector CT imaging of the abdomen and pelvis was performed using the standard protocol following bolus administration of intravenous contrast.  CONTRAST:  142mL OMNIPAQUE IOHEXOL 300 MG/ML SOLN an 05/28/2013 again noted are small sub cm low-density structures in the liver which are indeterminate. Portal venous system is patent. Normal appearance of the spleen pancreas and adrenal glands. Normal appearance of both kidneys incidentally, there are parapelvic cysts, left side greater than right. There is edema in the lower abdominal mesentery.  No acute bone abnormality.  COMPARISON:  05/28/2013  FINDINGS: Bibasilar linear densities are suggestive for  atelectasis. No evidence for pleural effusions. There is free air underneath the right hemidiaphragm and along the anterior abdomen. There is extraluminal gas and oral contrast in the presacral space. This air-contrast collection measures 4.5 x 2.7 x 6.3 cm. This is best seen on sequence 2, image 67. Findings are consistent with Hebert bowel leak. Patient has Hebert distal colonic anastomosis. There is wall thickening of the colon just proximal to the anastomosis and there is an anastomotic leak on sequence 2, image 71. There is distention of the right colon but no evidence for Hebert high-grade obstruction. There is additional fluid and thickening in the presacral region of the pelvis. The air-contrast collection is connected to small pockets of gas that involve the right adnexa. There is gas in the right adnexa and there is an air-fluid level in the endometrial cavity. The patient has an IUD in the uterus.  Again noted are small sub cm low-density structures in the liver which are indeterminate. Portal venous system is patent. Normal appearance of the spleen, pancreas and adrenal glands. Normal appearance of both kidneys, incidentally, there are parapelvic cyst. There is edema in the lower abdominal mesentery. No acute bone abnormality.  IMPRESSION: Study is positive for Hebert bowel leak at the distal colonic anastomosis. There is Hebert collection of oral contrast and gas in the presacral region. Gas extends into the right adnexa and this explains the gas within the endometrial cavity. There is an air-fluid level within the endometrial cavity. Free air in the anterior abdomen is  consistent with the bowel perforation.  Again noted are small low-density structures in the liver that have not significantly changed. These are indeterminate and described on prior MRI from 06/17/2013.  These results were called by telephone at the time of interpretation on 09/13/2013 at 5:00 PM to Dr. [name] , who verbally acknowledged these results.    Electronically Signed   By: Markus Daft M.D.   On: 09/13/2013 17:04    Anti-infectives: Anti-infectives   Start     Dose/Rate Route Frequency Ordered Stop   09/14/13 0500  piperacillin-tazobactam (ZOSYN) IVPB 3.375 g     3.375 g 12.5 mL/hr over 240 Minutes Intravenous Every 8 hours 09/14/13 0054     09/14/13 0100  piperacillin-tazobactam (ZOSYN) IVPB 3.375 g  Status:  Discontinued     3.375 g 100 mL/hr over 30 Minutes Intravenous  Once 09/14/13 0054 09/14/13 0058   09/13/13 2130  piperacillin-tazobactam (ZOSYN) IVPB 3.375 g     3.375 g 100 mL/hr over 30 Minutes Intravenous  Once 09/13/13 2127 09/13/13 2118      Assessment/Plan: s/p Procedure(s): EXPLORATORY LAPAROTOMY / FLEXIBLE SIGMOIDOSCOPY/  DRAINAGE OF PELVIC ABSCESS WITH  DIVERTING  LOOP  ILEOSTOMY (N/Hebert)  Transfer to regular floor Will allow sips Continue antibiotics Leave foley in place secondary to voiding issues  LOS: 2 days    [name] 09/15/2013

## 2013-09-15 NOTE — Progress Notes (Signed)
This evening, pt ambulated around the entire unit x 1 and tolerated well then sat in chair for 1 hour.  Tolerated well.  Pain 7/10 at surgical site.  Continue to monitor.

## 2013-09-16 ENCOUNTER — Encounter (HOSPITAL_COMMUNITY): Payer: Self-pay | Admitting: Surgery

## 2013-09-16 ENCOUNTER — Encounter (INDEPENDENT_AMBULATORY_CARE_PROVIDER_SITE_OTHER): Payer: 59 | Admitting: Surgery

## 2013-09-16 LAB — CBC WITH DIFFERENTIAL/PLATELET
BASOS PCT: 1 % (ref 0–1)
Basophils Absolute: 0.1 10*3/uL (ref 0.0–0.1)
Eosinophils Absolute: 0.2 10*3/uL (ref 0.0–0.7)
Eosinophils Relative: 4 % (ref 0–5)
HEMATOCRIT: 33.2 % — AB (ref 36.0–46.0)
Hemoglobin: 10.9 g/dL — ABNORMAL LOW (ref 12.0–15.0)
Lymphocytes Relative: 9 % — ABNORMAL LOW (ref 12–46)
Lymphs Abs: 0.5 10*3/uL — ABNORMAL LOW (ref 0.7–4.0)
MCH: 31.8 pg (ref 26.0–34.0)
MCHC: 32.8 g/dL (ref 30.0–36.0)
MCV: 96.8 fL (ref 78.0–100.0)
MONO ABS: 0.4 10*3/uL (ref 0.1–1.0)
Monocytes Relative: 7 % (ref 3–12)
NEUTROS PCT: 79 % — AB (ref 43–77)
Neutro Abs: 4.2 10*3/uL (ref 1.7–7.7)
PLATELETS: 340 10*3/uL (ref 150–400)
RBC: 3.43 MIL/uL — AB (ref 3.87–5.11)
RDW: 13.6 % (ref 11.5–15.5)
WBC: 5.3 10*3/uL (ref 4.0–10.5)

## 2013-09-16 NOTE — Progress Notes (Addendum)
General Surgery Note  LOS: 3 days  POD -  3 Days Post-Op  Assessment/Plan: 1  EXPLORATORY LAPAROTOMY / FLEXIBLE SIGMOIDOSCOPY/  DRAINAGE OF PELVIC ABSCESS WITH  DIVERTING  LOOP  ILEOSTOMY - 09/13/2013 - D. Kaseem Vastine  For leak from [place] - 09/02/2013 - D. Darrold Bezek  Continue limited po intake.  Doing okay.  2. DJD   Has seen Dr. Sherrian Divers  3. Anxiety  4. IUD - stuck   Sees Dr. Susette Racer  5. Indeterminate 6 mm liver lesion seen on CT scan  6. History of HELLP syndrome with first pregnancy  7. According to her husband, she does hear well without her hearing aids.  8. Urinary incontinence.   Dr. Chauncey Cruel. MacDiarmid's has seen her. 9. History of back surgery with prior perineal numbness   Dr. Sherrian Divers has seen.  10. DVT prophylaxis - SQ Heparin 11.  Have left foley because of urinary retention and problems.  Active Problems:   Large bowel perforation   Subjective:  Alert.  Still sore.  No appetite.  Her parents are in the room.  I think that she looks better than few days ago.  Objective:   Filed Vitals:   09/16/13 1036  BP: 120/70  Pulse: 79  Temp: 98.9 F (37.2 C)  Resp: 18     Intake/Output from previous day:  03/29 0701 - 03/30 0700 In: 3210 [P.O.:60; I.V.:3000; IV Piggyback:150] Out: 2255 [Urine:1800; Drains:280; Stool:175]  Intake/Output this shift:  Total I/O In: 0  Out: 350 [Urine:300; Drains:50]   Physical Exam:   General: WN WF who is alert and oriented.    HEENT: Normal. Pupils equal. .   Lungs: Clear   Abdomen: Mild distention.     Wound: Open lower midline wound.   LLQ drian - 280 cc yesterday, 50 cc so far today.     Lab Results:    Recent Labs  09/14/13 0325 09/16/13 0412  WBC 12.8* 5.3  HGB 11.6* 10.9*  HCT 33.9* 33.2*  PLT 275 340    BMET   Recent Labs  09/13/13 1745 09/14/13 0325  NA 131* 136*  K 3.6* 4.6  CL 94* 102  CO2 21 21  GLUCOSE 118* 225*  BUN 12 8  CREATININE 1.06 0.70  CALCIUM 9.3 9.1    PT/INR  No results found for this  basename: LABPROT, INR,  in the last 72 hours  ABG  No results found for this basename: PHART, PCO2, PO2, HCO3,  in the last 72 hours   Studies/Results:  No results found.   Anti-infectives:   Anti-infectives   Start     Dose/Rate Route Frequency Ordered Stop   09/14/13 0500  piperacillin-tazobactam (ZOSYN) IVPB 3.375 g     3.375 g 12.5 mL/hr over 240 Minutes Intravenous Every 8 hours 09/14/13 0054     09/14/13 0100  piperacillin-tazobactam (ZOSYN) IVPB 3.375 g  Status:  Discontinued     3.375 g 100 mL/hr over 30 Minutes Intravenous  Once 09/14/13 0054 09/14/13 0058   09/13/13 2130  piperacillin-tazobactam (ZOSYN) IVPB 3.375 g     3.375 g 100 mL/hr over 30 Minutes Intravenous  Once 09/13/13 2127 09/13/13 2118      [name], MD, FACS Pager: [facility] Office: [phone] 09/16/2013

## 2013-09-16 NOTE — Consult Note (Signed)
WOC ostomy consult note Stoma type/location: RLQ ileostomy Stomal assessment/size: 1 and 3/4 inches round, budded, moist.  Loop stoma with red rubber catheter rod sutured together in place. Peristomal assessment: intact, clear.  Abdomen edematous, firm. Treatment options for stomal/peristomal skin: None indicated  Output: thin, dark green effluent Ostomy pouching: 2pc. 2 and 1/4 inch pouching system, with skin barrier ring (washer) Education provided: Patient, husband present for entire teaching session.  Patient's parents arrived mid-way through session and stayed until session complete.  All asking appropriate questions.  Basic GI A&P taught, also stoma and pouch characteristics.  Pouch removal and reapplication demonstrated, also stoma sizing and rationale.  Simulated pouch emptying demonstrated, patient mentions current difficulties with voiding and that she must self cath if there is no indwelling urinary catheter in place. Patient able to give return demonstration of pouch closure with Lock and Roll closure today.  Educational booklet left in room for patient and husband to review.  I have also provided a sample pouching system so that they may practice snapping the two pieces together and practice the Lock and Roll closure. I will plan on seeing again tomorrow. I feel that patient will benefit from a few weeks of support from a [facility] as she learns ostomy care and how to manage wound.  If you agree, please order so that plans can be made in advance of the holiday weekend. [place] nursing team will follow along with you, and will remain available to this patient, the nursing and surgical teams.   Thanks, Maudie Flakes, MSN, RN, [place], [place], [place] [phone])

## 2013-09-17 ENCOUNTER — Ambulatory Visit: Payer: Self-pay | Admitting: Oncology

## 2013-09-17 LAB — CULTURE, ROUTINE-ABSCESS

## 2013-09-17 NOTE — Progress Notes (Signed)
General Surgery Note  LOS: 4 days  POD -  4 Days Post-Op  Assessment/Plan: 1  EXPLORATORY LAPAROTOMY / FLEXIBLE SIGMOIDOSCOPY/  DRAINAGE OF PELVIC ABSCESS WITH  DIVERTING  LOOP  ILEOSTOMY - 09/13/2013 - D. Ash Mcelwain  For leak from [place] - 09/02/2013 - D. [employer] functioning.  Tolerating sips - will start clr liq tray  2. DJD   Has seen Dr. Sherrian Divers  3. Anxiety  4. IUD - stuck   Sees Dr. Susette Racer  5. Indeterminate 6 mm liver lesion seen on CT scan  6. History of HELLP syndrome with first pregnancy  7. According to her husband, she does hear well without her hearing aids.  8. Urinary incontinence.   Dr. Chauncey Cruel. MacDiarmid's has seen her. 9. History of back surgery with prior perineal numbness   Dr. Sherrian Divers has seen. 10. DVT prophylaxis - SQ Heparin 11.  Have left foley because of urinary retention and problems.  Will wait until taking po's fairly well before trying foley out.  Active Problems:   Large bowel perforation  Subjective:  Feels worn out.  Taking po's from floor okay without nausea, though feels bloated at times.  Husband in room.  Discussed plan with drain, diet, re-imaging, wound, and foley.  Objective:   Filed Vitals:   09/17/13 0450  BP: 116/78  Pulse: 89  Temp: 99.2 F (37.3 C)  Resp: 20     Intake/Output from previous day:  03/30 0701 - 03/31 0700 In: 1540 [P.O.:120; I.V.:3000; IV Piggyback:150] Out: 0867 [Urine:3400; Drains:125; Stool:2425]  Intake/Output this shift:      Physical Exam:   General: WN WF who is alert and oriented.    HEENT: Normal. Pupils equal. .   Lungs: Clear   Abdomen: Mild distention.  bS present.   Wound: Open lower midline wound.   LLQ drian - 125 cc recorded.     Lab Results:     Recent Labs  09/16/13 0412  WBC 5.3  HGB 10.9*  HCT 33.2*  PLT 340    BMET  No results found for this basename: NA, K, CL, CO2, GLUCOSE, BUN, CREATININE, CALCIUM,  in the last 72 hours  PT/INR  No results found for this basename: LABPROT,  INR,  in the last 72 hours  ABG  No results found for this basename: PHART, PCO2, PO2, HCO3,  in the last 72 hours   Studies/Results:  No results found.   Anti-infectives:   Anti-infectives   Start     Dose/Rate Route Frequency Ordered Stop   09/14/13 0500  piperacillin-tazobactam (ZOSYN) IVPB 3.375 g     3.375 g 12.5 mL/hr over 240 Minutes Intravenous Every 8 hours 09/14/13 0054     09/14/13 0100  piperacillin-tazobactam (ZOSYN) IVPB 3.375 g  Status:  Discontinued     3.375 g 100 mL/hr over 30 Minutes Intravenous  Once 09/14/13 0054 09/14/13 0058   09/13/13 2130  piperacillin-tazobactam (ZOSYN) IVPB 3.375 g     3.375 g 100 mL/hr over 30 Minutes Intravenous  Once 09/13/13 2127 09/13/13 2118      [name], MD, FACS Pager: [facility] Office: [phone] 09/17/2013

## 2013-09-18 ENCOUNTER — Encounter (HOSPITAL_BASED_OUTPATIENT_CLINIC_OR_DEPARTMENT_OTHER): Admission: RE | Payer: Self-pay | Source: Ambulatory Visit

## 2013-09-18 ENCOUNTER — Ambulatory Visit (HOSPITAL_BASED_OUTPATIENT_CLINIC_OR_DEPARTMENT_OTHER): Admission: RE | Admit: 2013-09-18 | Payer: 59 | Source: Ambulatory Visit | Admitting: Surgery

## 2013-09-18 LAB — ANAEROBIC CULTURE

## 2013-09-18 SURGERY — INSERTION, TUNNELED CENTRAL VENOUS DEVICE, WITH PORT
Anesthesia: General

## 2013-09-18 MED ORDER — ENSURE COMPLETE PO LIQD
237.0000 mL | Freq: Two times a day (BID) | ORAL | Status: DC
  Administered 2013-09-18 – 2013-09-24 (×11): 237 mL via ORAL

## 2013-09-18 NOTE — Progress Notes (Addendum)
General Surgery Note  LOS: 5 days  POD -  5 Days Post-Op  Assessment/Plan: 1  EXPLORATORY LAPAROTOMY / FLEXIBLE SIGMOIDOSCOPY/  DRAINAGE OF PELVIC ABSCESS WITH  DIVERTING  LOOP  ILEOSTOMY - 09/13/2013 - D. Divine Imber  For leak from [place] - 09/02/2013 - D. [employer] functioning.  Tolerating clr liq tray  Will try to use more Vicodin to control her pain.  Will advance diet.  1A.  Open wound - clean  Dressing changes BID 2. DJD   Has seen Dr. Sherrian Divers  3. Anxiety  4. IUD - stuck   Sees Dr. Susette Racer  5. Indeterminate 6 mm liver lesion seen on CT scan  6. History of HELLP syndrome with first pregnancy  7. According to her husband, she does hear well without her hearing aids.  8. Urinary incontinence.   Dr. Chauncey Cruel. MacDiarmid's has seen her. 9. History of back surgery with prior perineal numbness   Dr. Sherrian Divers has seen. 10. DVT prophylaxis - SQ Heparin 11.  Have left foley because of urinary retention and problems.  Will wait until taking po's fairly well before trying foley out.   Active Problems:   Large bowel perforation  Subjective:  Husband in room.  Looks better today.  Tolerated clear liquids.  Wants something that tastes better.  Objective:   Filed Vitals:   09/18/13 0520  BP: 103/67  Pulse: 98  Temp: 99.4 F (37.4 C)  Resp: 18     Intake/Output from previous day:  03/31 0701 - 04/01 0700 In: 6606 [P.O.:1080; I.V.:1927; IV Piggyback:150] Out: 3016 [Urine:3300; Drains:55; WFUXN:2355]  Intake/Output this shift:      Physical Exam:   General: WN WF who is alert and oriented.    HEENT: Normal. Pupils equal. .   Lungs: Clear   Abdomen: Mild distention.  BS present.   Wound: Open lower midline wound - this looks clean with good granulation tissue.   LLQ drian - 55 cc recorded.  RLQ ileostomy with typical small bowel output.     Lab Results:     Recent Labs  09/16/13 0412  WBC 5.3  HGB 10.9*  HCT 33.2*  PLT 340    BMET  No results found for this basename:  NA, K, CL, CO2, GLUCOSE, BUN, CREATININE, CALCIUM,  in the last 72 hours  PT/INR  No results found for this basename: LABPROT, INR,  in the last 72 hours  ABG  No results found for this basename: PHART, PCO2, PO2, HCO3,  in the last 72 hours   Studies/Results:  No results found.   Anti-infectives:   Anti-infectives   Start     Dose/Rate Route Frequency Ordered Stop   09/14/13 0500  piperacillin-tazobactam (ZOSYN) IVPB 3.375 g     3.375 g 12.5 mL/hr over 240 Minutes Intravenous Every 8 hours 09/14/13 0054     09/14/13 0100  piperacillin-tazobactam (ZOSYN) IVPB 3.375 g  Status:  Discontinued     3.375 g 100 mL/hr over 30 Minutes Intravenous  Once 09/14/13 0054 09/14/13 0058   09/13/13 2130  piperacillin-tazobactam (ZOSYN) IVPB 3.375 g     3.375 g 100 mL/hr over 30 Minutes Intravenous  Once 09/13/13 2127 09/13/13 2118      [name], MD, FACS Pager: [facility] Office: [phone] 09/18/2013

## 2013-09-18 NOTE — Consult Note (Addendum)
WOC ostomy follow up Stoma type/location: RLQ Ileostomy Stomal assessment/size: per Monday Peristomal assessment: not seen today; pouch has become over-filled twice and nurses have had to perform emergent emptying.  I spoke with bedside RN today and have asked for hourly pouch checks and reminded them to assist with emptying when 1/3 to 1/2 full. Treatment options for stomal/peristomal skin:  Output thin brown effluent with stool flecks Ostomy pouching: 2pc. 2 and 1/4 inch pouching system with a skin barrier ring Education provided: Patient's parents in for visit and mother is asking appropriate questions regarding her daughter's wardrobe (specifically the use of yoga-type pants) and she is reassured that this type of garment wound not interfere with stoma function.  Additional questions answered regarding dietary measures to control consistency of effluent:  Mother knows someone who also has an ileostomy and who self-medicated with metamucil and/or imodium to regulate consistency.  We discussed how at this time it is difficult to determine the type of consistency that patient will have given that she is still on a full liquid diet and also pain medications.  We discussed the BRAT diet and how many people with ileostomies "find their way" with applesauce, rice, bananas, tapioca, etc.  In addition to a regular diet consisting of their favorite foods once they are home. Plan for pouch change tomorrow am at [place] when husband can be present. Patient is established with [facility] post discharge support and sampling program. [place] nursing team will follow, and will remain available to this patient, the nursing, surgical and medical teams.   Thanks, Maudie Flakes, MSN, RN, [place], [place], [place] [phone])

## 2013-09-19 ENCOUNTER — Telehealth: Payer: Self-pay | Admitting: Oncology

## 2013-09-19 ENCOUNTER — Other Ambulatory Visit: Payer: Self-pay | Admitting: *Deleted

## 2013-09-19 LAB — CBC WITH DIFFERENTIAL/PLATELET
BASOS ABS: 0.1 10*3/uL (ref 0.0–0.1)
BASOS PCT: 1 % (ref 0–1)
Eosinophils Absolute: 0.3 10*3/uL (ref 0.0–0.7)
Eosinophils Relative: 4 % (ref 0–5)
HCT: 37.4 % (ref 36.0–46.0)
Hemoglobin: 12.6 g/dL (ref 12.0–15.0)
Lymphocytes Relative: 9 % — ABNORMAL LOW (ref 12–46)
Lymphs Abs: 0.7 10*3/uL (ref 0.7–4.0)
MCH: 31.3 pg (ref 26.0–34.0)
MCHC: 33.7 g/dL (ref 30.0–36.0)
MCV: 92.8 fL (ref 78.0–100.0)
MONOS PCT: 12 % (ref 3–12)
Monocytes Absolute: 0.9 10*3/uL (ref 0.1–1.0)
NEUTROS ABS: 5.7 10*3/uL (ref 1.7–7.7)
NEUTROS PCT: 74 % (ref 43–77)
Platelets: 332 10*3/uL (ref 150–400)
RBC: 4.03 MIL/uL (ref 3.87–5.11)
RDW: 13.2 % (ref 11.5–15.5)
WBC: 7.7 10*3/uL (ref 4.0–10.5)

## 2013-09-19 NOTE — Progress Notes (Signed)
15F foley placed.  550 ccs of clear yellow urine returned.  Pt states she feels relief and no longer feels full.  Will continue to monitor.  Roselind Rily

## 2013-09-19 NOTE — Telephone Encounter (Signed)
added appt for 4/17 per 4/2 pof. per GIna she will give pt appt schedule.

## 2013-09-19 NOTE — Consult Note (Signed)
WOC ostomy follow up Stoma type/location: RLQ ileostomy Stomal assessment/size: 1 and 3/4 inches round stoma with red robinson catheter bridge in place Peristomal assessment: intact, clear Treatment options for stomal/peristomal skin: none indicated Output thin brown effluent Ostomy pouching: 2pc. 2 and 1/4 inch pouching system Education provided: Husband in room.  Emptying demonstrated using toilet paper wicks.  Patient prepared new skin barrier, cutting pre-traced pattern. Pouch removal demonstrated using warm wet washcloths, patient a little overwhelmed at stool and cleaning process.  Skin barrier ring, skin barrier applied, patient able to affix pouch to skin barrier.  Numerous questions answered pertaining to routine, cleaning, changing, showering, intimacy, diet. Patient medicated for pain during session. Supplies in room for discharge.  Recommend continuing ostomy support and education as well as monitoring of wound for a few weeks by [facility].  If you agree, please order. Enrolled patient in [place] Start Discharge program: Yes [place] nursing team will follow, and will remain available to this patient, the nursing, surgical and medical teams.   Time spent with patient/spouse = 75 minutes Thanks, Maudie Flakes, MSN, RN, [place], [place], [place] [phone])

## 2013-09-19 NOTE — Progress Notes (Signed)
Pt unable to urinate since 1400.  Unable to bladder scan due to incision.  No I&O or replace foley order.  Notified MD on call. MD ordered to replace foley.  Roselind Rily

## 2013-09-19 NOTE — Progress Notes (Signed)
General Surgery Note  LOS: 6 days  POD -  6 Days Post-Op  Assessment/Plan: 1  EXPLORATORY LAPAROTOMY / FLEXIBLE SIGMOIDOSCOPY/  DRAINAGE OF PELVIC ABSCESS WITH  DIVERTING  LOOP  ILEOSTOMY - 09/13/2013 - D. [name]  For leak from [place] - 09/02/2013 - D. [employer] functioning.  On full liquids. To advance diet.  Low grade temp.  WBC okay, yet to decide whether to CT scan prior to discharge.  1A.  Open wound - clean  Dressing changes BID 2. DJD   Has seen Dr. Sherrian Divers  3. Anxiety  4. IUD - stuck   Sees Dr. Susette Racer  5. Indeterminate 6 mm liver lesion seen on CT scan  6. History of HELLP syndrome with first pregnancy  7. According to her husband, she does hear well without her hearing aids.  8. Urinary incontinence/retention  Dr. Chauncey Cruel. MacDiarmid's has seen her.  Will try foley out today. 9. Perineal numbness.  Remote history of back surgery.   Dr. Sherrian Divers has seen.  Cause of numbness unclear. 10. DVT prophylaxis - SQ Heparin   Active Problems:   Large bowel perforation  Subjective:  Husband in room.  Tolerated full liquids.  Low grade temp.  Overall seems to be doing okay. Objective:   Filed Vitals:   09/19/13 0630  BP: 105/68  Pulse: 104  Temp: 100.8 F (38.2 C)  Resp: 18     Intake/Output from previous day:  04/01 0701 - 04/02 0700 In: 463 [P.O.:120; I.V.:288; IV Piggyback:50] Out: 1914 [Urine:1700; Drains:20; Stool:2250]  Intake/Output this shift:  Total I/O In: 0  Out: 655 [Urine:400; Drains:5; Stool:250]   Physical Exam:   General: WN WF who is alert and oriented.    HEENT: Normal. Pupils equal. .   Lungs: Clear   Abdomen: Soft.  BS present.   Wound: Open lower midline wound. LLQ drian - 20 cc recorded.  RLQ ileostomy with typical small bowel output.     Lab Results:     Recent Labs  09/19/13 0415  WBC 7.7  HGB 12.6  HCT 37.4  PLT 332    BMET  No results found for this basename: NA, K, CL, CO2, GLUCOSE, BUN, CREATININE, CALCIUM,  in the last 72  hours  PT/INR  No results found for this basename: LABPROT, INR,  in the last 72 hours  ABG  No results found for this basename: PHART, PCO2, PO2, HCO3,  in the last 72 hours   Studies/Results:  No results found.   Anti-infectives:   Anti-infectives   Start     Dose/Rate Route Frequency Ordered Stop   09/14/13 0500  piperacillin-tazobactam (ZOSYN) IVPB 3.375 g     3.375 g 12.5 mL/hr over 240 Minutes Intravenous Every 8 hours 09/14/13 0054     09/14/13 0100  piperacillin-tazobactam (ZOSYN) IVPB 3.375 g  Status:  Discontinued     3.375 g 100 mL/hr over 30 Minutes Intravenous  Once 09/14/13 0054 09/14/13 0058   09/13/13 2130  piperacillin-tazobactam (ZOSYN) IVPB 3.375 g     3.375 g 100 mL/hr over 30 Minutes Intravenous  Once 09/13/13 2127 09/13/13 2118      [name], MD, FACS Pager: [facility] Office: [phone] 09/19/2013

## 2013-09-19 NOTE — Care Management Note (Signed)
   CARE MANAGEMENT NOTE 09/19/2013  Patient:  [name], [name]   Account Number:  [id number]  Date Initiated:  09/19/2013  Documentation initiated by:  Laural Eiland  Subjective/Objective Assessment:   53 yo female admitted with a large bowel perforation.     Action/Plan:   Home when stable   Anticipated DC Date:     Anticipated DC Plan:  [place]  CM consult      Choice offered to / List presented to:             Status of service:  In process, will continue to follow Medicare Important Message given?   (If response is "NO", the following Medicare IM given date fields will be blank) Date Medicare IM given:   Date Additional Medicare IM given:    Discharge Disposition:    Per UR Regulation:  Reviewed for med. necessity/level of care/duration of stay  If discussed at [place] of Stay Meetings, dates discussed:    Comments:  09/19/13 [place] [phone] Chart reviewed for utilization of services. Per [place] note pt will require HHRN for dressing changes for 2-3 weeks. Pt will require Md order with specific orders for dressing changes upon discharge.

## 2013-09-20 ENCOUNTER — Encounter: Payer: Self-pay | Admitting: *Deleted

## 2013-09-20 MED ORDER — OXYCODONE-ACETAMINOPHEN 5-325 MG PO TABS
1.0000 | ORAL_TABLET | ORAL | Status: DC | PRN
  Administered 2013-09-20 – 2013-09-24 (×21): 2 via ORAL
  Filled 2013-09-20 (×21): qty 2

## 2013-09-20 NOTE — Progress Notes (Signed)
NUTRITION FOLLOW UP  Intervention:   - Continue Ensure Complete BID - Provided sample packets of [employer] Instant Breakfast - Had long discussion with pt and husband about diet therapy for ileostomy. Encouraged small frequent bland, low fiber meals, (adding fiber back in gradually as tolerated) with recommendation of largest meal at the middle of the day, and discussed importance of adequate hydration to replace fluid losses from ileostomy. Handouts provided with RD contact information - Will continue to monitor   Nutrition Dx:   Inadequate oral intake related to altered GI status as evidenced by clear liquid diet and recent GI surgery - ongoing but now related to poor appetite as evidenced by pt report.   Goal:   Tolerate diet advancement with intake >75% of estimated needs - advancement met however pt not consuming >75% of estimated needs   Monitor:   Weights, labs, intake   Assessment:   Patient s/p LAPAROSCOPIC LOW ANTERIOR RESECTION, appendectomy - 09/02/2013 - D. Newman. She had neoadjuvant chemo/radiation therapy. Dr. Jacinto Reap. Benay Spice and Melina Modena have seen her for oncology. 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 Margins clear.   Leak at the anastomosis and admitted now s/p EXPLORATORY LAPAROTOMY / FLEXIBLE SIGMOIDOSCOPY/ DRAINAGE OF PELVIC ABSCESS WITH DIVERTING LOOP ILEOSTOMY.   3/29 - Spoke with patient who reports tolerating small amounts of clear liquids post op. States that her intake last week was fair and prior to surgery 2 weeks ago was good. Tried to eat a healthy diet. Patient has been seen by the [place] RD 06/11/13. Weight overall appears stable to her usual pre op weight.  4/3 - Diet advanced to regular yesterday. Met with pt and husband. Pt struggling with poor appetite but has been drinking Ensure. Still adjusting to having ileostomy. Bag being emptied 3-4 times/day. Ate 65% of dinner last night. Importance of nutrition for  overall health and wound healing discussed. Pt had tried organic protein shake Enu this morning, however did not like the taste.     Height: Ht Readings from Last 1 Encounters:  09/14/13 5' 6"  (1.676 m)    Weight Status:   Wt Readings from Last 1 Encounters:  09/14/13 150 lb 12.7 oz (68.4 kg)    Re-estimated needs:  Kcal:1800-2000  Protein: 85-95 bm  Fluid: 1.8-2L daily   Skin: incision   Diet Order: General   Intake/Output Summary (Last 24 hours) at 09/20/13 1022 Last data filed at 09/20/13 0943  Gross per 24 hour  Intake    480 ml  Output   2080 ml  Net  -1600 ml    Last BM: 4/2   Labs:   Recent Labs Lab 09/13/13 1745 09/14/13 0325  NA 131* 136*  K 3.6* 4.6  CL 94* 102  CO2 21 21  BUN 12 8  CREATININE 1.06 0.70  CALCIUM 9.3 9.1  GLUCOSE 118* 225*    CBG (last 3)  No results found for this basename: GLUCAP,  in the last 72 hours  Scheduled Meds: . feeding supplement (ENSURE COMPLETE)  237 mL Oral BID BM  . heparin subcutaneous  5,000 Units Subcutaneous 3 times per day  . piperacillin-tazobactam (ZOSYN)  IV  3.375 g Intravenous [address] [place], RD, [place] Pager [phone] After Hours Pager

## 2013-09-20 NOTE — Consult Note (Signed)
WOC ostomy follow up Stoma type/location: RLQ ileostomy Stomal assessment/size: 1 and 3/4 inches,budded round with stoma bridge in place. Pouching system applied yesterday is intact. Peristomal assessment: Not seen today Treatment options for stomal/peristomal skin: None indicated Output Thickening light brown stool Ostomy pouching: 2pc. , 2 and 1/4 inch pouching system with skin barrier ring Education provided: Extended session to encompass patient's questions about ostomy pouch covers (OstomySecrets), activity (specifically showering), self-catheterization issues for neurogenic bladder Psychologist, counselling).  Patient voices painful sensation (despite numbness) in the genital area and she and I discussed possibility of a numbing agent during catheterizations (perhaps lidocaine?).  Patient to discuss further with MD during his visit today, or with urologist.  Patient's husband in room and they feel equipped to handle situations at the moment.  [place] nurse team will cover for me in my absence on Monday if patient is still in house. Enrolled patient in [place] Start Discharge program: Yes.  Kit delivered to patient in her room today. [place] nursing team will follow, and will remain available to this patient, the nursing, surgical and medical teams.   Thanks, Maudie Flakes, MSN, RN, [place], [place], [place] [phone])

## 2013-09-20 NOTE — Progress Notes (Signed)
General Surgery Note  LOS: 7 days  POD -  7 Days Post-Op  Assessment/Plan: 1  EXPLORATORY LAPAROTOMY / FLEXIBLE SIGMOIDOSCOPY/  DRAINAGE OF PELVIC ABSCESS WITH  DIVERTING  LOOP  ILEOSTOMY - 09/13/2013 - D. Stefania Goulart  For leak from [name] - 09/02/2013 - D. Lysbeth Penner - 3/27 >>>>  Ostomy functioning.  Tolerated reg diet.  Will let her shower and leave the floor.  The main issue is her urinary retention.  Will I&O cath.  If doing well, may go home over weekend.  Switch to oral antibiotics and leave drain in on discharge.  1A.  Open wound - clean - looks good today.  Dressing changes BID 2. DJD   Has seen Dr. Sherrian Divers  3. Anxiety  4. IUD - stuck   Sees Dr. Susette Racer  5. Indeterminate 6 mm liver lesion seen on CT scan  6. History of HELLP syndrome with first pregnancy  7. According to her husband, she does hear well without her hearing aids.  8. Urinary incontinence/retention  Dr. Chauncey Cruel. [facility] has seen her.  She did not tolerate the foley out.  She had residuals of 500+cc and only over flow incontinence. 9. Perineal numbness.  Remote history of back surgery.   Dr. Sherrian Divers has seen.  Cause of numbness unclear. 10. DVT prophylaxis - SQ Heparin   Active Problems:   Large bowel perforation  Subjective:  Husband in room. Tolerated reg food.  Clearly looks better.  Still sore, will switch to Percocet for pain. Objective:   Filed Vitals:   09/20/13 0535  BP: 106/71  Pulse: 94  Temp: 99.2 F (37.3 C)  Resp: 16     Intake/Output from previous day:  04/02 0701 - 04/03 0700 In: 720 [P.O.:720] Out: 2080 [Urine:1625; Drains:5; Stool:450]  Intake/Output this shift:      Physical Exam:   General: WN WF who is alert and oriented.    HEENT: Normal. Pupils equal. .   Lungs: Clear   Abdomen: Soft.  BS present.   Wound: Open lower midline wound. LLQ drian - ony 5 cc recorded last 24 hours.  RLQ ileostomy with typical small bowel output.     Lab Results:     Recent Labs   09/19/13 0415  WBC 7.7  HGB 12.6  HCT 37.4  PLT 332    BMET  No results found for this basename: NA, K, CL, CO2, GLUCOSE, BUN, CREATININE, CALCIUM,  in the last 72 hours  PT/INR  No results found for this basename: LABPROT, INR,  in the last 72 hours  ABG  No results found for this basename: PHART, PCO2, PO2, HCO3,  in the last 72 hours   Studies/Results:  No results found.   Anti-infectives:   Anti-infectives   Start     Dose/Rate Route Frequency Ordered Stop   09/14/13 0500  piperacillin-tazobactam (ZOSYN) IVPB 3.375 g     3.375 g 12.5 mL/hr over 240 Minutes Intravenous Every 8 hours 09/14/13 0054     09/14/13 0100  piperacillin-tazobactam (ZOSYN) IVPB 3.375 g  Status:  Discontinued     3.375 g 100 mL/hr over 30 Minutes Intravenous  Once 09/14/13 0054 09/14/13 0058   09/13/13 2130  piperacillin-tazobactam (ZOSYN) IVPB 3.375 g     3.375 g 100 mL/hr over 30 Minutes Intravenous  Once 09/13/13 2127 09/13/13 2118      [name], MD, FACS Pager: [facility] Office: [phone] 09/20/2013

## 2013-09-20 NOTE — [facility] Oncology Navigator Note (Signed)
Met with patient.  She is feeling better, but overwhelmed with having to self-cath and deal with care of ostomy, wound, etc.  She had questions regarding her future chemo treatments and time was spent discussing the regimens MD has mentioned to her.  She denies any needs such as financial, SW, etc.  She may come to future GI support group.  She has a strong support system of family in place.  Will continue to follow as needed.

## 2013-09-21 NOTE — Progress Notes (Signed)
Patient ID: [name], female   DOB: Sep 19, 1960, 53 y.o.   MRN: [id number] [facility] Progress Note:   8 Days Post-Op  Subjective: Mental status is clear.  Undergoing in/out cath which she intends to continue at home.   Objective: Vital signs in last 24 hours: Temp:  [98.2 F (36.8 C)-99.4 F (37.4 C)] 99.4 F (37.4 C) (04/04 0542) Pulse Rate:  [93-98] 97 (04/04 0542) Resp:  [16-20] 20 (04/04 0542) BP: (97-122)/(66-79) 97/66 mmHg (04/04 0542) SpO2:  [93 %-97 %] 93 % (04/04 0542)  Intake/Output from previous day: 04/03 0701 - 04/04 0700 In: 23 [P.O.:840; IV Piggyback:400] Out: 3100 [Urine:2300; Stool:800] Intake/Output this shift: Total I/O In: -  Out: 175 [Stool:175]  Physical Exam: Work of breathing is normal.  Incision packed;  JP minimal serous; ileostomy functioning properly  Lab Results:  No results found for this or any previous visit (from the past 48 hour(s)).  Radiology/Results: No results found.  Anti-infectives: Anti-infectives   Start     Dose/Rate Route Frequency Ordered Stop   09/14/13 0500  piperacillin-tazobactam (ZOSYN) IVPB 3.375 g     3.375 g 12.5 mL/hr over 240 Minutes Intravenous Every 8 hours 09/14/13 0054     09/14/13 0100  piperacillin-tazobactam (ZOSYN) IVPB 3.375 g  Status:  Discontinued     3.375 g 100 mL/hr over 30 Minutes Intravenous  Once 09/14/13 0054 09/14/13 0058   09/13/13 2130  piperacillin-tazobactam (ZOSYN) IVPB 3.375 g     3.375 g 100 mL/hr over 30 Minutes Intravenous  Once 09/13/13 2127 09/13/13 2118      Assessment/Plan: Problem List: Patient Active Problem List   Diagnosis Date Noted  . Large bowel perforation 09/13/2013  . Rectal cancer, 12 - 15 cm from anal verge 05/29/2013  . Herniation of cervical intervertebral disc with radiculopathy 01/03/2012    Slow improvement.  Not ready for discharge as Dr. [name] anticipates her being here throughout the weekend.   8 Days Post-Op    LOS: 8 days    Matt B. Hassell Done, MD, [facility] [phone] beeper [phone]  09/21/2013 9:36 AM

## 2013-09-22 NOTE — Progress Notes (Signed)
Patient ID: [name], female   DOB: Nov 28, 1960, 53 y.o.   MRN: [id number] [facility] Progress Note:   9 Days Post-Op  Subjective: Mental status is clear;  No complaints this morning but she did have a fever last night;  No cough Objective: Vital signs in last 24 hours: Temp:  [98.1 F (36.7 C)-102.4 F (39.1 C)] 98.1 F (36.7 C) (04/05 0555) Pulse Rate:  [87-109] 87 (04/05 0555) Resp:  [16-18] 16 (04/05 0555) BP: (99-115)/(66-71) 99/66 mmHg (04/05 0555) SpO2:  [94 %-95 %] 95 % (04/05 0555)  Intake/Output from previous day: 04/04 0701 - 04/05 0700 In: 687 [P.O.:537; IV Piggyback:150] Out: 2805 [Urine:1800; Drains:5; Stool:1000] Intake/Output this shift: Total I/O In: -  Out: 50 [Stool:50]  Physical Exam: Work of breathing is not labored.  JP serous-unchanged.  Ileostomy output liquid  Lab Results:  No results found for this or any previous visit (from the past 48 hour(s)).  Radiology/Results: No results found.  Anti-infectives: Anti-infectives   Start     Dose/Rate Route Frequency Ordered Stop   09/14/13 0500  piperacillin-tazobactam (ZOSYN) IVPB 3.375 g     3.375 g 12.5 mL/hr over 240 Minutes Intravenous Every 8 hours 09/14/13 0054     09/14/13 0100  piperacillin-tazobactam (ZOSYN) IVPB 3.375 g  Status:  Discontinued     3.375 g 100 mL/hr over 30 Minutes Intravenous  Once 09/14/13 0054 09/14/13 0058   09/13/13 2130  piperacillin-tazobactam (ZOSYN) IVPB 3.375 g     3.375 g 100 mL/hr over 30 Minutes Intravenous  Once 09/13/13 2127 09/13/13 2118      Assessment/Plan: Problem List: Patient Active Problem List   Diagnosis Date Noted  . Large bowel perforation 09/13/2013  . Rectal cancer, 12 - 15 cm from anal verge 05/29/2013  . Herniation of cervical intervertebral disc with radiculopathy 01/03/2012    Will recheck CBC in AM and recheck temp this evening.  If recurrent spike, then may need repeat CT scan abdomen/pelvis 9 Days Post-Op    LOS: 9  days   Matt B. Hassell Done, MD, [facility] [phone] beeper [phone]  09/22/2013 8:51 AM

## 2013-09-23 ENCOUNTER — Encounter (HOSPITAL_COMMUNITY): Payer: Self-pay

## 2013-09-23 ENCOUNTER — Inpatient Hospital Stay (HOSPITAL_COMMUNITY): Payer: 59

## 2013-09-23 LAB — CBC WITH DIFFERENTIAL/PLATELET
BASOS ABS: 0.1 10*3/uL (ref 0.0–0.1)
Basophils Relative: 2 % — ABNORMAL HIGH (ref 0–1)
Eosinophils Absolute: 0.2 10*3/uL (ref 0.0–0.7)
Eosinophils Relative: 3 % (ref 0–5)
HCT: 35.2 % — ABNORMAL LOW (ref 36.0–46.0)
Hemoglobin: 12.4 g/dL (ref 12.0–15.0)
LYMPHS ABS: 1.6 10*3/uL (ref 0.7–4.0)
Lymphocytes Relative: 23 % (ref 12–46)
MCH: 32 pg (ref 26.0–34.0)
MCHC: 35.2 g/dL (ref 30.0–36.0)
MCV: 90.7 fL (ref 78.0–100.0)
MONOS PCT: 9 % (ref 3–12)
Monocytes Absolute: 0.6 10*3/uL (ref 0.1–1.0)
NEUTROS PCT: 63 % (ref 43–77)
Neutro Abs: 4.4 10*3/uL (ref 1.7–7.7)
PLATELETS: 245 10*3/uL (ref 150–400)
RBC: 3.88 MIL/uL (ref 3.87–5.11)
RDW: 13.2 % (ref 11.5–15.5)
WBC: 6.9 10*3/uL (ref 4.0–10.5)

## 2013-09-23 MED ORDER — IOHEXOL 300 MG/ML  SOLN
100.0000 mL | Freq: Once | INTRAMUSCULAR | Status: AC | PRN
  Administered 2013-09-23: 100 mL via INTRAVENOUS

## 2013-09-23 MED ORDER — IOHEXOL 300 MG/ML  SOLN
25.0000 mL | INTRAMUSCULAR | Status: AC
  Administered 2013-09-23 (×2): 25 mL via ORAL

## 2013-09-23 NOTE — Progress Notes (Signed)
General Surgery Note  LOS: 10 days  POD -  10 Days Post-Op  Assessment/Plan: 1  EXPLORATORY LAPAROTOMY / FLEXIBLE SIGMOIDOSCOPY/  DRAINAGE OF PELVIC ABSCESS WITH  DIVERTING  LOOP  ILEOSTOMY - 09/13/2013 - D. Rosary Filosa  For leak from [place] - 09/02/2013 - D. Lysbeth Penner - 3/27 >>>>  Ostomy functioning.  Tolerated reg diet.  Will let her shower and leave the floor.  The main issue is her urinary retention.  Will I&O cath.   1A.  Open wound - clean - looks good today.  Dressing changes BID 2. DJD   Has seen Dr. Sherrian Divers  3. Anxiety  4. IUD - stuck   Sees Dr. Susette Racer  5. Indeterminate 6 mm liver lesion seen on CT scan  6. History of HELLP syndrome with first pregnancy  7. According to her husband, she does hear well without her hearing aids.  8. Urinary incontinence/retention  Dr. Chauncey Cruel. MacDi[facility]id's has seen her.  She did not tolerate the foley out.  She had residuals of 500+cc and only over flow incontinence. 9. Perineal numbness.  Remote history of back surgery.   Dr. Sherrian Divers has seen.  Cause of numbness unclear. 10. DVT prophylaxis - SQ Heparin   Active Problems:   Large bowel perforation  Subjective:  Husband in room. Tolerated reg food.  Clearly looks better.  Still sore, will switch to Percocet for pain. Objective:   Filed Vitals:   09/23/13 0500  BP: 103/69  Pulse: 83  Temp: 98.5 F (36.9 C)  Resp: 20     Intake/Output from previous day:  04/05 0701 - 04/06 0700 In: 870 [P.O.:720; IV Piggyback:150] Out: 2210 [Urine:1300; Stool:910]  Intake/Output this shift:      Physical Exam:   General: WN WF who is alert and oriented.    HEENT: Normal. Pupils equal. .   Lungs: Clear   Abdomen: Soft.  BS present.   Wound: Open lower midline wound. LLQ drian - ony 5 cc recorded last 24 hours.  RLQ ileostomy with typical small bowel output.     Lab Results:     Recent Labs  09/23/13 0503  WBC 6.9  HGB 12.4  HCT 35.2*  PLT 245    BMET  No results found for this  basename: NA, K, CL, CO2, GLUCOSE, BUN, CREATININE, CALCIUM,  in the last 72 hours  PT/INR  No results found for this basename: LABPROT, INR,  in the last 72 hours  ABG  No results found for this basename: PHART, PCO2, PO2, HCO3,  in the last 72 hours   Studies/Results:  No results found.   Anti-infectives:   Anti-infectives   Start     Dose/Rate Route Frequency Ordered Stop   09/14/13 0500  piperacillin-tazobactam (ZOSYN) IVPB 3.375 g     3.375 g 12.5 mL/hr over 240 Minutes Intravenous Every 8 hours 09/14/13 0054     09/14/13 0100  piperacillin-tazobactam (ZOSYN) IVPB 3.375 g  Status:  Discontinued     3.375 g 100 mL/hr over 30 Minutes Intravenous  Once 09/14/13 0054 09/14/13 0058   09/13/13 2130  piperacillin-tazobactam (ZOSYN) IVPB 3.375 g     3.375 g 100 mL/hr over 30 Minutes Intravenous  Once 09/13/13 2127 09/13/13 2118      [name], MD, FACS Pager: [facility] Office: [phone] 09/23/2013

## 2013-09-23 NOTE — Consult Note (Signed)
WOC ostomy consult note Stoma type/location: RLQ ileostomy  Stomal assessment/size: 1 and 3/4 inches,budded round with stoma bridge in place. Pouching system was changed last night due to a leak at 9:00.  System is intact with no leak at this time. Midline abdominal dressing in place.  Peristomal assessment: Not seen today  Treatment options for stomal/peristomal skin: None indicated  Output Thickening light brown stool  Ostomy pouching: 2pc. , 2 and 1/4 inch pouching system with skin barrier ring Education provided: Patient is going down for a CT scan in a few minutes.  Was medicated for abdominal pain.  Answered questions from patient and spouse regarding obtaining supplies once home.  Anticipating possible discharge home tomorrow.  [facility] team will continue to follow this patient.  [name] RN BSN [place] Pager [phone]

## 2013-09-24 MED ORDER — OXYCODONE-ACETAMINOPHEN 5-325 MG PO TABS: 1.0000 | ORAL_TABLET | Freq: Four times a day (QID) | ORAL | Status: DC | PRN

## 2013-09-24 MED ORDER — AMOXICILLIN-POT CLAVULANATE 875-125 MG PO TABS: 1.0000 | ORAL_TABLET | Freq: Two times a day (BID) | ORAL | Status: DC

## 2013-09-24 NOTE — Progress Notes (Signed)
Pt given discharge instructions.  Pt and husband verbalized understanding of discharge instructions and new medication.  No concerns at time of discharge.

## 2013-09-24 NOTE — Discharge Instructions (Signed)
[facility] - DISCHARGE INSTRUCTIONS TO PATIENT  Activity:  Driving - May drive in 4 or 5 days, if doing well   Lifting - No lifting greater than 15 pounds for 2 more weeks.  Wound Care:   Change wound twice a day.  May shower.  Clean wound and pack with saline gauze.             Empty the JP at least daily.  Irrigate with 3 cc of saline every 3 days.  Bring record of drainage to the office.             Self cath once a day - after voiding - to see what residual you have  Diet:  As tolerated.  Follow up appointment:  Call Dr. Pollie Friar office [facility]) at [phone] for an appointment next Wednesday, 4/15.           Follow up with Dr. Matilde Sprang for bladder.  [facility] [phone].  Medications and dosages:  Resume your home medications.  You have a prescription for:  Percocet and Augmentin.  Call Dr. [name] or his office  [phone]) if you have:  Temperature greater than 100.4,  Persistent nausea and vomiting,  Severe uncontrolled pain,  Redness, tenderness, or signs of infection (pain, swelling, redness, odor or green/yellow discharge around the site),  Difficulty breathing, headache or visual disturbances,  Any other questions or concerns you may have after discharge.  In an emergency, call 911 or go to an Emergency Department at a nearby hospital.

## 2013-09-24 NOTE — Consult Note (Signed)
WOC ostomy follow up Stoma type/location: Discharge teaching and stoma bridge removal per Dr. Pollie Friar request Stomal assessment/size: 1 and 5/8 inches round, budded loop stoma with proximal limb on top of distal limb (os at 11 o'clock) Peristomal assessment: intact, clear Treatment options for stomal/peristomal skin: parastomal skin barrier ring placed Output thin brown effluent Ostomy pouching: 2pc.  Education provided: Patient and family (husband and parents) observed stoma sizing and placement of stomy pouch.  Questions asked about devising a system for patient showering and they are instructed that this is not required.  Inquiring about pouch covers; pattern provided.  Reminded that supplies are again coming to home from second delivery from [employer]; all have my contact information in the event of remaining or new questions.  Bridge (red rubber catheter) removed without difficulty or discomfort.  Patient is eager to "sleep in her own bed" and wear "regular" clothes.  Expressed that bladder is filling and she is experiencing overflow voiding, but that the residuals remain high.  It is suggested that she allow the staff to straight cath once more before departing so that the first thing she has do do at home is not catheterization. Enrolled patient in [place] Start Discharge program: Yes Extended visit.  Time spent with patient and family = 75 minutes. Patient is ready for discharge. Thanks, Maudie Flakes, MSN, RN, [place], [place], [place] [phone])

## 2013-09-24 NOTE — Progress Notes (Signed)
Pt's sheets and gown soaked in sweat.  Pt's temp taken: 98.1. Sheets changed.  Pain medicine given due to increased pain.  Will continue to monitor.    Roselind Rily

## 2013-09-24 NOTE — Discharge Summary (Signed)
Physician Discharge Summary  Patient ID:  [name]  MRN: [id number]  DOB/AGE: Nov 11, 1960 53 y.o.  Admit date: 09/13/2013 Discharge date: 09/24/2013  Discharge Diagnoses:  1 EXPLORATORY LAPAROTOMY / FLEXIBLE SIGMOIDOSCOPY/ DRAINAGE OF PELVIC ABSCESS WITH DIVERTING LOOP ILEOSTOMY - 09/13/2013 - D. James Senn   For leak from [place] - 09/02/2013 - D. Javaya [place]   1A. Open wound - clean  Dressing changes BID  2. DJD   Has seen Dr. Sherrian Divers  3. Anxiety  4. IUD - stuck   Sees Dr. Susette Racer  5. Indeterminate 6 mm liver lesion seen on CT scan  6. History of HELLP syndrome with first pregnancy  7. According to her husband, she does hear well without her hearing aids.  8. Urinary incontinence/retention   Dr. Chauncey Cruel. MacDiarmid's has seen her.   She did not tolerate the foley out. She had residuals of 500+cc and only over flow incontinence.  9. Perineal numbness. Remote history of back surgery.   Dr. Sherrian Divers has seen. Cause of numbness unclear.     Active Problems:   Large bowel perforation   Operation: Procedure(s): EXPLORATORY LAPAROTOMY / FLEXIBLE SIGMOIDOSCOPY/  DRAINAGE OF PELVIC ABSCESS WITH  DIVERTING  LOOP  ILEOSTOMY on 09/13/2013 - D. [name]  Discharged Condition: good  Hospital Course: [name] is an 53 y.o. female whose primary care physician is NEAL,W RONALD, MD and who was admitted 09/13/2013 with a chief complaint of a leak at the anastomosis of a prior low anterior resection.  She had a LAR for a rectal cancer on 09/03/2103. She had run fevers at home for a few days. A CT scan on 09/13/2013 showed a leak at the low anterior anastomosis.   She was brought to the operating room on 09/13/2013 and underwent an EXPLORATORY LAPAROTOMY / FLEXIBLE SIGMOIDOSCOPY/  DRAINAGE OF PELVIC ABSCESS WITH  DIVERTING  LOOP  ILEOSTOMY.  Her post op course was slow, but she seemed to progress in the right direction. Her main issues were continued numbness of her perineum, so I left the foley in place for  about one week.  She is voiding some on her own now, but how much residual she has is unclear. Her midline wound has done well. Her ostomy started functioning after about 3 or 4 days, she has been advanced to regular diet and is tolerating this well. I have left her JP drain in place, though it is not draining much right now. A CT scan done 09/23/2013 shows some residual air and fluid around the rectum.  But no new collection. Her WBC is normal.   She is ready to go home.  We will arrange [facility] to help with the ostomy, wound, drain, and I&O cath.  The discharge instructions were reviewed with the patient.  Consults: None  Significant Diagnostic Studies:  Ct Abdomen Pelvis W Contrast  09/23/2013   CLINICAL DATA:  Colon cancer.  EXAM: CT ABDOMEN AND PELVIS WITH CONTRAST  TECHNIQUE: Multidetector CT imaging of the abdomen and pelvis was performed using the standard protocol following bolus administration of intravenous contrast.  CONTRAST:  100 cc of Omnipaque  COMPARISON:  09/13/2013  FINDINGS: No pleural effusion identified. Subsegmental atelectasis is noted in both lung bases. There is a sub cm low attenuation structure within the anterior right hepatic lobe measuring 5 mm, image 19/series 2. This is unchanged from previous exam. The gallbladder appears normal. No biliary dilatation. Normal appearance of the pancreas. The spleen is on unremarkable.  Normal  appearance of the kidneys. The urinary bladder contains a small amount of gas which may be related to instrumentation. There is an IUD within the uterine cavity.  Normal caliber of the abdominal aorta. No retroperitoneal adenopathy identified. There is no pelvic or inguinal adenopathy identified. The stomach is normal. The patient has undergone diverting loop ileostomy. The proximal colon is on unremarkable. The patient has a distal colonic anastomosis. Enteric contrast material is identified within the colon proximal to the anastomosis. There is  insufficient amount of contrast in the area of the anastomosis to assess for contrast extravasation. There has been decrease in volume of the air and fluid collection within the adjacent presacral region. This currently measures 2.1 x 4.4 cm, image 67/series 2. Previously this measured 2.7 x 4.5 cm. There is a drainage catheter which enters the abdomen in the left lower quadrant. The tip terminates in the presacral fluid collection. There are no new fluid collections identified.  Review of the visualized osseous structures is significant for mild lumbar degenerative disc disease.  IMPRESSION: 1. Decrease in size of gas and fluid collection within the presacral region status post diverting ileostomy and drainage catheter placement. 2. Insufficient contrast within the distal colon at the level of the anastomosis to rule out persistent leak. Followup imaging with rectal contrast material may be helpful. 3. No new fluid collections identified. 4. Stable low attenuation structure within the liver which is too small to reliably characterize.   Electronically Signed   By: Kerby Moors M.D.   On: 09/23/2013 14:25   Ct Abdomen Pelvis W Contrast  09/13/2013   CLINICAL DATA:  Fever and status post colonic resection. Evaluate for leak.  EXAM: CT ABDOMEN AND PELVIS WITH CONTRAST  TECHNIQUE: Multidetector CT imaging of the abdomen and pelvis was performed using the standard protocol following bolus administration of intravenous contrast.  CONTRAST:  161mL OMNIPAQUE IOHEXOL 300 MG/ML SOLN an 05/28/2013 again noted are small sub cm low-density structures in the liver which are indeterminate. Portal venous system is patent. Normal appearance of the spleen pancreas and adrenal glands. Normal appearance of both kidneys incidentally, there are parapelvic cysts, left side greater than right. There is edema in the lower abdominal mesentery.  No acute bone abnormality.  COMPARISON:  05/28/2013  FINDINGS: Bibasilar linear densities  are suggestive for atelectasis. No evidence for pleural effusions. There is free air underneath the right hemidiaphragm and along the anterior abdomen. There is extraluminal gas and oral contrast in the presacral space. This air-contrast collection measures 4.5 x 2.7 x 6.3 cm. This is best seen on sequence 2, image 67. Findings are consistent with a bowel leak. Patient has a distal colonic anastomosis. There is wall thickening of the colon just proximal to the anastomosis and there is an anastomotic leak on sequence 2, image 71. There is distention of the right colon but no evidence for a high-grade obstruction. There is additional fluid and thickening in the presacral region of the pelvis. The air-contrast collection is connected to small pockets of gas that involve the right adnexa. There is gas in the right adnexa and there is an air-fluid level in the endometrial cavity. The patient has an IUD in the uterus.  Again noted are small sub cm low-density structures in the liver which are indeterminate. Portal venous system is patent. Normal appearance of the spleen, pancreas and adrenal glands. Normal appearance of both kidneys, incidentally, there are parapelvic cyst. There is edema in the lower abdominal mesentery. No acute bone  abnormality.  IMPRESSION: Study is positive for a bowel leak at the distal colonic anastomosis. There is a collection of oral contrast and gas in the presacral region. Gas extends into the right adnexa and this explains the gas within the endometrial cavity. There is an air-fluid level within the endometrial cavity. Free air in the anterior abdomen is consistent with the bowel perforation.  Again noted are small low-density structures in the liver that have not significantly changed. These are indeterminate and described on prior MRI from 06/17/2013.  These results were called by telephone at the time of interpretation on 09/13/2013 at 5:00 PM to Dr. [name] , who verbally acknowledged  these results.   Electronically Signed   By: Markus Daft M.D.   On: 09/13/2013 17:04    Discharge Exam:  Filed Vitals:   09/24/13 0519  BP: 111/74  Pulse: 87  Temp: 99 F (37.2 C)  Resp: 18    General: WN WF who is alert and generally healthy appearing.  Lungs: Clear to auscultation and symmetric breath sounds. Heart:  RRR. No murmur or rub. Abdomen: Soft. Lower midline incision is clean.  RLQ ostomy is functioning.  LLQ drain with minimal fluid.  Still having pain and trouble with urination.   Discharge Medications:     Medication List         ALPRAZolam 0.5 MG tablet  Commonly known as:  XANAX  Take 0.5 mg by mouth at bedtime as needed for anxiety or sleep.     amoxicillin-clavulanate 875-125 MG per tablet  Commonly known as:  AUGMENTIN  Take 1 tablet by mouth 2 (two) times daily.     estradiol 0.05 MG/24HR patch  Commonly known as:  VIVELLE-DOT  Place 1 patch onto the skin 2 (two) times a week. Sundays and Wednesday     hydrochlorothiazide 25 MG tablet  Commonly known as:  HYDRODIURIL  Take 25 mg by mouth daily as needed (edema or puffiness).     HYDROcodone-acetaminophen 5-325 MG per tablet  Commonly known as:  NORCO/VICODIN  Take 1 tablet by mouth every 6 (six) hours as needed for moderate pain.     ibuprofen 200 MG tablet  Commonly known as:  ADVIL,MOTRIN  Take 600 mg by mouth every 6 (six) hours as needed for moderate pain.     oxyCODONE-acetaminophen 5-325 MG per tablet  Commonly known as:  ROXICET  Take 1-2 tablets by mouth every 6 (six) hours as needed for severe pain.     prochlorperazine 10 MG tablet  Commonly known as:  COMPAZINE  Take 10 mg by mouth every 6 (six) hours as needed for nausea or vomiting.     progesterone 200 MG capsule  Commonly known as:  PROMETRIUM  Take 200 mg by mouth. Every 3 months     sertraline 50 MG tablet  Commonly known as:  ZOLOFT  Take 50 mg by mouth every morning.     sulfamethoxazole-trimethoprim 800-160 MG per  tablet  Commonly known as:  BACTRIM DS  Take 1 tablet by mouth 2 (two) times daily. Started 09/12/23 for 7 days supply     TESTOSTERONE IM  Inject into the muscle every 30 (thirty) days.        Disposition: 01-Home or Self Care      Discharge Orders   Future Appointments Provider Department Dept Phone   10/04/2013 2:15 PM Owens Shark, NP [facility] [phone]   02/27/2014 1:00 PM [place],  MD [facility] [phone]   Future Orders Complete By Expires   Diet - low sodium heart healthy  As directed    Home Health  As directed    Scheduling Instructions:     Change abdominal dressing BID Help with ostomy care Empty JP drain daily and irrigate with 3 cc of saline every 3 days I&O cath for urine PRN   Questions:     To provide the following care/treatments:  RN   Increase activity slowly  As directed      Activity:  Driving - May drive in 4 or 5 days, if doing well   Lifting - No lifting greater than 15 pounds for 2 more weeks.  Wound Care:   Change wound twice a day.  May shower.  Clean wound and pack with saline gauze.             Empty the JP at least daily.  Irrigate with 3 cc of saline every 3 days.  Bring record of drainage to the office.             Self cath once a day - after voiding - to see what residual you have  Diet:  As tolerated.  Follow up appointment:  Call Dr. Pollie Friar office [facility]) at [phone] for an appointment next Wednesday, 4/15.           Follow up with Dr. Matilde Sprang for bladder.  [facility] [phone].  Medications and dosages:  Resume your home medications.  You have a prescription for:  Percocet and Augmentin.   Signed: [name], M.D., [facility] Office:  [phone]  09/24/2013, 10:28 AM

## 2013-09-24 NOTE — Care Management Note (Signed)
    Page 1 of 1   09/24/2013     11:03:30 AM   CARE MANAGEMENT NOTE 09/24/2013  Patient:  [name], [name]   Account Number:  [id number]  Date Initiated:  09/19/2013  Documentation initiated by:  DAVIS,TYMEEKA  Subjective/Objective Assessment:   53 yo female admitted with a large bowel perforation.     Action/Plan:   Home when stable   Anticipated DC Date:  09/24/2013   Anticipated DC Plan:  [place]  CM consult      [facility] Choice  HOME HEALTH   Choice offered to / List presented to:  C-1 Patient        [place] arranged  HH-1 RN      [place].   Status of service:  Completed, signed off Medicare Important Message given?  NA - LOS <3 / Initial given by admissions (If response is "NO", the following Medicare IM given date fields will be blank) Date Medicare IM given:   Date Additional Medicare IM given:    Discharge Disposition:  [place]  Per UR Regulation:  Reviewed for med. necessity/level of care/duration of stay  If discussed at [place] of Stay Meetings, dates discussed:    Comments:  09/19/13 [place] [phone] Chart reviewed for utilization of services. Per [place] note pt will require HHRN for dressing changes for 2-3 weeks. Pt will require Md order with specific orders for dressing changes upon discharge.

## 2013-09-29 ENCOUNTER — Telehealth (INDEPENDENT_AMBULATORY_CARE_PROVIDER_SITE_OTHER): Payer: Self-pay | Admitting: General Surgery

## 2013-09-29 NOTE — Telephone Encounter (Signed)
She has an open wound and ran out of Percocet which she has for her BID dressing changes being done by her husband.  She has hydrocodone.  I told her how to try and change her dressing with less pain-shower, get bandage wet, remove it in the shower and then do dressing change.  I told her husband to try the hydrocodone with dressing changes and see if that works.  If she needs the Percocet for dressing changes, I told them to call the office tomorrow during normal hours.

## 2013-09-30 ENCOUNTER — Other Ambulatory Visit (INDEPENDENT_AMBULATORY_CARE_PROVIDER_SITE_OTHER): Payer: Self-pay | Admitting: General Surgery

## 2013-09-30 ENCOUNTER — Telehealth (INDEPENDENT_AMBULATORY_CARE_PROVIDER_SITE_OTHER): Payer: Self-pay | Admitting: General Surgery

## 2013-09-30 MED ORDER — OXYCODONE-ACETAMINOPHEN 5-325 MG PO TABS: 1.0000 | ORAL_TABLET | Freq: Four times a day (QID) | ORAL | Status: DC | PRN

## 2013-09-30 NOTE — Telephone Encounter (Signed)
Pt called for refill of percocet.  She has been taking Norco that was leftover from a previous procedure;  Norco is not relieving her pain.  She requests more Percocet.  Has appt in the office later this week.  Please advise.

## 2013-09-30 NOTE — Telephone Encounter (Signed)
Per Dr. Pollie Friar verbal order (on phone) Percocet 5/325 mg, # 40 (forty), 1-2 Q6H prn pain, no refill written for signature in the office.  Pt to pick up at the front desk with photo ID.

## 2013-10-02 ENCOUNTER — Ambulatory Visit (INDEPENDENT_AMBULATORY_CARE_PROVIDER_SITE_OTHER): Payer: 59 | Admitting: Surgery

## 2013-10-02 ENCOUNTER — Encounter (INDEPENDENT_AMBULATORY_CARE_PROVIDER_SITE_OTHER): Payer: Self-pay | Admitting: Surgery

## 2013-10-02 VITALS — BP 112/78 | HR 71 | Temp 97.5°F | Resp 16 | Ht 64.0 in | Wt 139.6 lb

## 2013-10-02 DIAGNOSIS — K631 Perforation of intestine (nontraumatic): Secondary | ICD-10-CM

## 2013-10-02 DIAGNOSIS — C20 Malignant neoplasm of rectum: Secondary | ICD-10-CM

## 2013-10-02 NOTE — Progress Notes (Signed)
[place], MD,  [address]  [place], [place]    [place] Phone:  [phone] FAX:  [phone]   Re:   [name] DOB:   1961-02-18 MRN:   [id number]  ASSESSMENT AND PLAN: 1. Laparoscopic-assisted low anterior colon resection with #29 EEA anastomosis. Appedectomy - 09/02/2013 - D. Ikenna Ohms   Exploratory lap, drainage of pelvic abscess, diverting loop ileostomy, flexible sigmoidoscopy- 09/13/2013 - D. [name]   For oncology, sees Dr. Essie Christine  She had neoadjuvant chemo/radiation therapy.   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 Margins clear.   Ostomy functioning.    Plan - power port placement next week.  Will need to schedule CT/rectal contrasts to check fistula.  She sees Dr. Benay Spice this Friday.  Will talk to Dr. Benay Spice about timing of checking her rectum with the start of her chemo.  Follow up with me will depend on power port placement, next x-ray study of rectum, start of chemotherapy and how she is doing.  Somewhere between 2 to 4 weeks.  1A. Open wound - clean - looks good today.   Dressing changes BID  2. DJD   Has seen Dr. Sherrian Divers  3. Anxiety  4. IUD - stuck   Sees Dr. Susette Racer  5. Indeterminate 6 mm liver lesion seen on CT scan  6. History of HELLP syndrome with first pregnancy  7. According to her husband, she does hear well without her hearing aids.  8. Urinary incontinence/retention   Dr. Chauncey Cruel. MacDiarmid's saw her Monday, 4/13.   She is still requiring self cath 4x per day at about 200 cc per cath.  She is feeling nothing and only has overflow incontinence. 9. Perineal numbness. Remote history of back surgery.   Dr. Sherrian Divers has seen.   By may exam the numbness is primarily on the left, goes down the left posterior thigh about 6 inches and left posterior area up from the rectum about 3 inches.    HISTORY OF PRESENT ILLNESS: Chief Complaint  Patient  presents with  . Follow-up   [name] is a 53 y.o. (DOB: December 19, 1960)  white  female who is a patient of NEAL,W RONALD, MD and comes to me today for follow up of LAR and rectal leak. She is accompanied with her husband.  [name] has had a rough time.  She is about the same as when she left the hospital.  She is off her antibiotics.  She has no appetite, but is eating and has gained a small amount of weight. Her ileostomy bag has leaked. Her lower midline wound is healing, but tender. She has made no progress in her bladder retention or perineal numbness. She is walking and looks better just looking at her.  Past Medical History  Diagnosis Date  . History of radiation therapy 06/18/13-07/26/13    rectal 50.4Gy total dose  . IUD (intrauterine device) in place 08-27-13    Mirena Implant inplace  . Arthritis 08-27-13    at present has ruptured disc- lower back-not an issue now  . Colon cancer 08-27-13    radiation /chemo -last ended 4 weeks(Dr. Benay Spice)  . PONV (postoperative nausea and vomiting)     scopalamine patch helped   Current Outpatient Prescriptions  Medication Sig Dispense Refill  . ALPRAZolam (XANAX) 0.5 MG tablet Take 0.5 mg by mouth at bedtime as needed for anxiety or sleep.       Marland Kitchen  amoxicillin-clavulanate (AUGMENTIN) 875-125 MG per tablet Take 1 tablet by mouth 2 (two) times daily.  9 tablet  0  . estradiol (VIVELLE-DOT) 0.05 MG/24HR Place 1 patch onto the skin 2 (two) times a week. Sundays and Wednesday      . hydrochlorothiazide (HYDRODIURIL) 25 MG tablet Take 25 mg by mouth daily as needed (edema or puffiness).       Marland Kitchen HYDROcodone-acetaminophen (NORCO/VICODIN) 5-325 MG per tablet Take 1 tablet by mouth every 6 (six) hours as needed for moderate pain.      Marland Kitchen ibuprofen (ADVIL,MOTRIN) 200 MG tablet Take 600 mg by mouth every 6 (six) hours as needed for moderate pain.       Marland Kitchen oxyCODONE-acetaminophen (ROXICET) 5-325 MG per tablet Take 1-2 tablets by mouth every 6 (six) hours  as needed.  40 tablet  0  . prochlorperazine (COMPAZINE) 10 MG tablet Take 10 mg by mouth every 6 (six) hours as needed for nausea or vomiting.      . progesterone (PROMETRIUM) 200 MG capsule Take 200 mg by mouth. Every 3 months      . sertraline (ZOLOFT) 50 MG tablet Take 50 mg by mouth every morning.       . Sodium Chloride Flush (NORMAL SALINE FLUSH) 0.9 % SOLN       . sulfamethoxazole-trimethoprim (BACTRIM DS) 800-160 MG per tablet Take 1 tablet by mouth 2 (two) times daily. Started 09/12/23 for 7 days supply      . TESTOSTERONE IM Inject into the muscle every 30 (thirty) days.        No current facility-administered medications for this visit.   SOCIAL HISTORY: Married. Her son has some graduate work in [place].  PHYSICAL EXAM: BP 112/78  Pulse 71  Temp(Src) 97.5 F (36.4 C) (Temporal)  Resp 16  Ht 5\' 4"  (1.626 m)  Wt 139 lb 9.6 oz (63.322 kg)  BMI 23.95 kg/m2  General: WN WF who is alert. HEENT: Normal. Pupils equal.  Neck: Supple. No mass.  No thyroid mass.   Lungs: Clear to auscultation and symmetric breath sounds. Heart:  RRR. No murmur or rub. Abdomen: Soft. No mass. RLQ ileostomy.  Lower midline incision - this is clean.  LLQ drain - scant output (<10cc per day) Rectal: Can squeeze sphincter.  Bu numb on left side from 3 inches above the left lateral rectum to 6 inches down the posterior left thigh. Extremities:  Her general strength of her lower extremities is good.  DATA REVIEWED: Epic notes.   [name], MD,  [facility], [address]  [place], [place]    [place] Phone:  [phone] FAX:  [phone]

## 2013-10-03 ENCOUNTER — Encounter (HOSPITAL_COMMUNITY): Payer: Self-pay | Admitting: Pharmacy Technician

## 2013-10-04 ENCOUNTER — Ambulatory Visit (HOSPITAL_BASED_OUTPATIENT_CLINIC_OR_DEPARTMENT_OTHER): Payer: 59 | Admitting: Nurse Practitioner

## 2013-10-04 ENCOUNTER — Telehealth (INDEPENDENT_AMBULATORY_CARE_PROVIDER_SITE_OTHER): Payer: Self-pay

## 2013-10-04 ENCOUNTER — Telehealth: Payer: Self-pay | Admitting: Oncology

## 2013-10-04 ENCOUNTER — Encounter (HOSPITAL_COMMUNITY): Payer: Self-pay

## 2013-10-04 ENCOUNTER — Encounter (HOSPITAL_COMMUNITY)
Admission: RE | Admit: 2013-10-04 | Discharge: 2013-10-04 | Disposition: A | Discharge status: Home / Self Care | Payer: 59 | Source: Ambulatory Visit | Attending: Surgery | Admitting: Surgery

## 2013-10-04 VITALS — BP 102/64 | HR 99 | Resp 20 | Ht 64.0 in | Wt 140.9 lb

## 2013-10-04 DIAGNOSIS — G893 Neoplasm related pain (acute) (chronic): Secondary | ICD-10-CM

## 2013-10-04 DIAGNOSIS — Z01812 Encounter for preprocedural laboratory examination: Secondary | ICD-10-CM | POA: Insufficient documentation

## 2013-10-04 DIAGNOSIS — C20 Malignant neoplasm of rectum: Secondary | ICD-10-CM

## 2013-10-04 LAB — BASIC METABOLIC PANEL
BUN: 17 mg/dL (ref 6–23)
CHLORIDE: 100 meq/L (ref 96–112)
CO2: 25 meq/L (ref 19–32)
Calcium: 9.9 mg/dL (ref 8.4–10.5)
Creatinine, Ser: 0.71 mg/dL (ref 0.50–1.10)
GFR calc Af Amer: >90 mL/min (ref >90)
GFR calc non Af Amer: >90 mL/min (ref >90)
GLUCOSE: 95 mg/dL (ref 70–99)
POTASSIUM: 4.9 meq/L (ref 3.7–5.3)
SODIUM: 136 meq/L — AB (ref 137–147)

## 2013-10-04 LAB — CBC
HEMATOCRIT: 34.9 % — AB (ref 36.0–46.0)
Hemoglobin: 12 g/dL (ref 12.0–15.0)
MCH: 30.7 pg (ref 26.0–34.0)
MCHC: 34.4 g/dL (ref 30.0–36.0)
MCV: 89.3 fL (ref 78.0–100.0)
Platelets: 238 10*3/uL (ref 150–400)
RBC: 3.91 MIL/uL (ref 3.87–5.11)
RDW: 13.8 % (ref 11.5–15.5)
WBC: 8.2 10*3/uL (ref 4.0–10.5)

## 2013-10-04 MED ORDER — OXYCODONE-ACETAMINOPHEN 5-325 MG PO TABS: 1.0000 | ORAL_TABLET | Freq: Four times a day (QID) | ORAL | Status: DC | PRN

## 2013-10-04 NOTE — Telephone Encounter (Signed)
gv pt appt schedule for april/may °

## 2013-10-04 NOTE — Patient Instructions (Signed)
Leucovorin injection What is this medicine? LEUCOVORIN (loo koe VOR in) is used to prevent or treat the harmful effects of some medicines. This medicine is used to treat anemia caused by a low amount of folic acid in the body. It is also used with 5-fluorouracil (5-FU) to treat colon cancer. This medicine may be used for other purposes; ask your health care provider or pharmacist if you have questions. What should I tell my health care provider before I take this medicine? They need to know if you have any of these conditions: -anemia from low levels of vitamin B-12 in the blood -an unusual or allergic reaction to leucovorin, folic acid, other medicines, foods, dyes, or preservatives -pregnant or trying to get pregnant -breast-feeding How should I use this medicine? This medicine is for injection into a muscle or into a vein. It is given by a health care professional in a hospital or clinic setting. Talk to your pediatrician regarding the use of this medicine in children. Special care may be needed. Overdosage: If you think you have taken too much of this medicine contact a poison control center or emergency room at once. NOTE: This medicine is only for you. Do not share this medicine with others. What if I miss a dose? This does not apply. What may interact with this medicine? -capecitabine -fluorouracil -phenobarbital -phenytoin -primidone -trimethoprim-sulfamethoxazole This list may not describe all possible interactions. Give your health care provider a list of all the medicines, herbs, non-prescription drugs, or dietary supplements you use. Also tell them if you smoke, drink alcohol, or use illegal drugs. Some items may interact with your medicine. What should I watch for while using this medicine? Your condition will be monitored carefully while you are receiving this medicine. This medicine may increase the side effects of 5-fluorouracil, 5-FU. Tell your doctor or health care  professional if you have diarrhea or mouth sores that do not get better or that get worse. What side effects may I notice from receiving this medicine? Side effects that you should report to your doctor or health care professional as soon as possible: -allergic reactions like skin rash, itching or hives, swelling of the face, lips, or tongue -breathing problems -fever, infection -mouth sores -unusual bleeding or bruising -unusually weak or tired Side effects that usually do not require medical attention (report to your doctor or health care professional if they continue or are bothersome): -constipation or diarrhea -loss of appetite -nausea, vomiting This list may not describe all possible side effects. Call your doctor for medical advice about side effects. You may report side effects to FDA at 1-800-FDA-1088. Where should I keep my medicine? This drug is given in a hospital or clinic and will not be stored at home. NOTE: This sheet is a summary. It may not cover all possible information. If you have questions about this medicine, talk to your doctor, pharmacist, or health care provider.  2014, Elsevier/Gold Standard. (2007-12-11 16:50:29)

## 2013-10-04 NOTE — Telephone Encounter (Signed)
Pt called requesting refill of her oxycodone 5-325. Pt states she will run out over weekend. Pt takes 2 tabs every 6 hours for pain. Pt states she still has open wd that is uncomfortable and her bottom is still very sore. Pt states level is 7-8 without med. Pt states ostomy working well. No fever. Eating well. Voiding well. Pt advised request will be sent to our urgent office MD since Dr [name] is off. Pt can be reached at [phone]. Pt state we can leave msg since she will be at pre admit [facility] this afternoon.

## 2013-10-04 NOTE — Telephone Encounter (Signed)
Patient [name] months status post emergent surgery.  It is okay to renew narcotic medication.  Oxycodone 5-10 mg PO every 4 hours when necessary pain #50

## 2013-10-04 NOTE — Telephone Encounter (Signed)
Received ok to refill pain med attached by Dr Johney Maine. [name] will get rx signed by MD.

## 2013-10-04 NOTE — Telephone Encounter (Signed)
Called and LMOVM for patient to make aware that her rx for Oxycodone is ready for pick up at the front desk.

## 2013-10-04 NOTE — Patient Instructions (Addendum)
  YOU MAY WANT TO BRING EXTRA OSTOMY EQUIPMENT   NO ASPIRIN OR HERBALS  YOUR SURGERY IS SCHEDULED AT [facility]  ON: Friday  4/24  REPORT TO  SHORT STAY CENTER AT:  8:00 AM      PHONE # FOR SHORT STAY IS [phone]  DO NOT EAT OR DRINK ANYTHING AFTER MIDNIGHT THE NIGHT BEFORE YOUR SURGERY.  YOU MAY BRUSH YOUR TEETH, RINSE OUT YOUR MOUTH--BUT NO WATER, NO FOOD, NO CHEWING GUM, NO MINTS, NO CANDIES, NO CHEWING TOBACCO.  PLEASE TAKE THE FOLLOWING MEDICATIONS THE AM OF YOUR SURGERY WITH A FEW SIPS OF WATER: Zoloft  IF YOU USE INHALERS--USE YOUR INHALERS THE AM OF YOUR SURGERY AND BRING INHALERS TO [facility].   IF YOU ARE DIABETIC:  DO NOT TAKE ANY DIABETIC MEDICATIONS THE AM OF YOUR SURGERY.  IF YOU TAKE INSULIN IN THE EVENINGS--PLEASE ONLY TAKE 1/2 NORMAL EVENING DOSE THE NIGHT BEFORE YOUR SURGERY.  NO INSULIN THE AM OF YOUR SURGERY. IF YOU HAVE SLEEP APNEA AND USE CPAP OR BIPAP--PLEASE BRING THE MASK AND THE TUBING.  DO NOT BRING YOUR MACHINE.  DO NOT BRING VALUABLES, MONEY, CREDIT CARDS.  DO NOT WEAR JEWELRY, MAKE-UP, NAIL POLISH AND NO METAL PINS OR CLIPS IN YOUR HAIR. CONTACT LENS, DENTURES / PARTIALS, GLASSES SHOULD NOT BE WORN TO SURGERY AND IN MOST CASES-HEARING AIDS WILL NEED TO BE REMOVED.  BRING YOUR GLASSES CASE, ANY EQUIPMENT NEEDED FOR YOUR CONTACT LENS. FOR PATIENTS ADMITTED TO THE HOSPITAL--CHECK OUT TIME THE DAY OF DISCHARGE IS 11:00 AM.  ALL INPATIENT ROOMS ARE PRIVATE - WITH BATHROOM, TELEPHONE, TELEVISION AND WIFI INTERNET.  IF YOU ARE BEING DISCHARGED THE SAME DAY OF YOUR SURGERY--YOU CAN NOT DRIVE YOURSELF HOME--AND SHOULD NOT GO HOME ALONE BY TAXI OR BUS.  NO DRIVING OR OPERATING MACHINERY, OR MAKING LEGAL DECISIONS FOR 24 HOURS FOLLOWING ANESTHESIA / PAIN MEDICATIONS.  PLEASE MAKE ARRANGEMENTS FOR SOMEONE TO BE WITH YOU AT HOME THE FIRST 24 HOURS AFTER SURGERY. RESPONSIBLE DRIVER'S NAME / PHONE                                                    FAILURE TO  FOLLOW THESE INSTRUCTIONS MAY RESULT IN THE CANCELLATION OF YOUR SURGERY. PLEASE BE AWARE THAT YOU MAY NEED ADDITIONAL BLOOD DRAWN DAY OF YOUR SURGERY  PATIENT SIGNATURE_________________________________

## 2013-10-04 NOTE — Progress Notes (Addendum)
[facility] OFFICE PROGRESS NOTE   Diagnosis:  Rectal cancer.  INTERVAL HISTORY:   [name] completed the course of radiation and concurrent Xeloda on 07/26/2013. On 09/02/2013 she underwent low anterior resection by Dr. [name]. Final pathology showed invasive adenocarcinoma (3.7 cm) extending into perirectal adipose tissue. The margins were negative. There was metastatic carcinoma in 7 of 11 lymph nodes. There was extensive lymph vascular involvement by tumor. Appendix with fibrous obliteration of the lumen and no evidence of malignancy. She was discharged home on 09/09/2013.  She was readmitted on 09/13/2013 after a CT scan showed a leak at the distal colonic anastomosis. She underwent an exploratory laparotomy with drainage of a pelvic abscess, diverting loop ileostomy. She was discharged 09/24/2013.  She is seen today to discuss adjuvant chemotherapy.  She continues scheduled self urinary catheterizations. She is able to expel "a small amount of urine" periodically. She continues to have numbness at the perineum and left buttock. Ileostomy is functioning. She has pain at the rectum especially with prolonged sitting and also has pain related to the abdominal incision. Abdominal wound is healing. She continues dressing changes. She is taking Percocet about every 4 hours. Appetite is improving.  Objective:  Vital signs in last 24 hours:  Blood pressure 102/64, pulse 99, temperature 0 F (-17.8 C), resp. rate 20, height 5\' 4"  (1.626 m), weight 140 lb 14.4 oz (63.912 kg).    HEENT: No thrush or ulcerations. Resp: Lungs clear. Cardio: Regular cardiac rhythm. GI: Abdomen is soft. Right lower quadrant ileostomy. Midline wound appears clean. Left abdomen drain site with mild surrounding erythema. Vascular: No leg edema.     Lab Results:  Lab Results  Component Value Date   WBC 8.2 10/04/2013   HGB 12.0 10/04/2013   HCT 34.9* 10/04/2013   MCV 89.3 10/04/2013   PLT  238 10/04/2013   NEUTROABS 4.4 09/23/2013    Imaging:  No results found.  Medications: I have reviewed the patient's current medications.  Assessment/Plan: 1. Rectal cancer, clinical stage III (T3 N1) status post biopsy of a rectal mass on 05/24/2013 confirming adenocarcinoma. Endoscopic ultrasound 05/31/2013 measured the mass at 11 cm from the anal verge, uT3uN1.   Initiation of radiation and concurrent Xeloda 06/18/2013; completion 07/26/2013.  Status post low anterior resection 09/02/2013. Invasive adenocarcinoma, 3.7 cm, negative margins; metastatic carcinoma in 7 of 11 lymph nodes; extensive lymph vascular involvement by tumor. 2. Post colonoscopy bowel perforation confirmed on abdomen CT 05/28/2013. 3. Single indeterminate liver lesion on the abdomen CT 05/28/2013. MRI on 06/18/2013 confirmed 2 tiny liver lesions, indeterminate-favored to be benign cysts. 4. History of cervical and lumbar disc disease.  5. History of HELLP syndrome with first pregnancy.  6. Intermittent nausea.  7. History of pain with bowel movements. 8. Anastomotic leak on CT 09/13/2013. Status post exploratory laparotomy with drainage of a pelvic abscess, diverting loop ileostomy. 9. Perineal numbness, urinary retention. She continues self-catheterization. 10. Abdominal wound. Healing. 11. Abdominal and rectal pain. She is taking Percocet as needed.   Disposition: She is recovering from the recent surgeries. Dr. Benay Spice reviewed the pathology report with [name] and her husband and recommended adjuvant chemotherapy on the FOLFOX regimen for 9 cycles.  We reviewed potential toxicities associated with chemotherapy including myelosuppression, nausea, hair loss. We reviewed potential toxicities associated with 5-fluorouracil including mouth sores, nausea, diarrhea, skin rash, hand-foot syndrome. We reviewed the neurotoxicity associated with oxaliplatin including cold sensitivity, peripheral neuropathy, acute  laryngopharyngeal dysesthesia as well as  the possibility of more rare occurrences such as diplopia, ataxia. She is agreeable to proceed. She is scheduled for Port-A-Cath placement next week. Pelvic CT with rectal contrast is also planned.  She will return for cycle 1 FOLFOX on 10/15/2013. We will see her prior to cycle 2 on 10/29/2013. She will contact the office prior to her next visit with any problems. She was given a new prescription for Percocet at today's visit.  Patient seen with Dr. Benay Spice. 30 minutes were spent face-to-face at today's visit with the majority of that time involved in counseling/coordination of care.    Owens Shark ANP/GNP-BC   10/04/2013  4:21 PM  This was a shared visit with Ned Card. The plan is to proceed with adjuvant FOLFOX chemotherapy for treatment of the stage III rectal cancer. We again reviewed the potential toxicities associated with this regimen and she agrees to proceed. I discussed the case with Dr. [name] this week. He will place a Port-A-Cath and obtain a pelvic CT to look for evidence of a persistent anastomotic leak. If the CT shows no leak we will proceed with adjuvant FOLFOX. The etiology of the urinary retention remains unclear. Hopefully this will improve over time.  [name], M.D.

## 2013-10-07 ENCOUNTER — Telehealth (INDEPENDENT_AMBULATORY_CARE_PROVIDER_SITE_OTHER): Payer: Self-pay

## 2013-10-07 NOTE — Telephone Encounter (Signed)
Delayed Entry:  Patient calling into office this morning to report that Dr. Benay Spice would like for her to have Rectal CT before starting her Treatment.  Paged Dr. [name] and was advised that patient will need to have the Rectal CT next week.  Patient states Dr. Benay Spice has her scheduled to start Treatment on Tuesday (10/15/13).  Advised patient that I would send a message to Dr. [name] to make aware of treatment start date and I will call patient back if there's any changes.  Patient verbalized understanding

## 2013-10-07 NOTE — Telephone Encounter (Signed)
Pt calling to f/u on ostomy supply orders from [facility].  Please watch for this.

## 2013-10-08 ENCOUNTER — Telehealth: Payer: Self-pay | Admitting: *Deleted

## 2013-10-08 ENCOUNTER — Other Ambulatory Visit (INDEPENDENT_AMBULATORY_CARE_PROVIDER_SITE_OTHER): Payer: Self-pay

## 2013-10-08 DIAGNOSIS — C20 Malignant neoplasm of rectum: Secondary | ICD-10-CM

## 2013-10-08 NOTE — Telephone Encounter (Signed)
Pt came into office for barrier ring for her colostomy. Her supplies will not be in until 4/22. Ostomy is leaking. Pt found what she needed in ostomy surplus supplies that were donated to office. Accompanied by a friend, denied any other needs at this time.

## 2013-10-09 ENCOUNTER — Telehealth (INDEPENDENT_AMBULATORY_CARE_PROVIDER_SITE_OTHER): Payer: Self-pay

## 2013-10-09 NOTE — Telephone Encounter (Signed)
Pt called to make sure Dr [name] signs her ostomy supply order today. Called his nurse glenda and she will find order and get him to sign it today.

## 2013-10-10 ENCOUNTER — Encounter (INDEPENDENT_AMBULATORY_CARE_PROVIDER_SITE_OTHER): Payer: Self-pay

## 2013-10-11 ENCOUNTER — Ambulatory Visit (HOSPITAL_COMMUNITY): Payer: 59

## 2013-10-11 ENCOUNTER — Encounter (HOSPITAL_COMMUNITY): Payer: 59 | Admitting: Anesthesiology

## 2013-10-11 ENCOUNTER — Ambulatory Visit (HOSPITAL_COMMUNITY)
Admission: RE | Admit: 2013-10-11 | Discharge: 2013-10-11 | Disposition: A | Discharge status: Home / Self Care | Payer: 59 | Source: Ambulatory Visit | Attending: Surgery | Admitting: Surgery

## 2013-10-11 ENCOUNTER — Encounter (HOSPITAL_COMMUNITY): Payer: Self-pay

## 2013-10-11 ENCOUNTER — Ambulatory Visit (HOSPITAL_COMMUNITY): Payer: 59 | Admitting: Anesthesiology

## 2013-10-11 ENCOUNTER — Encounter (HOSPITAL_COMMUNITY)
Admission: RE | Disposition: A | Discharge status: Home / Self Care | Payer: Self-pay | Source: Ambulatory Visit | Attending: Surgery

## 2013-10-11 DIAGNOSIS — F411 Generalized anxiety disorder: Secondary | ICD-10-CM | POA: Insufficient documentation

## 2013-10-11 DIAGNOSIS — R339 Retention of urine, unspecified: Secondary | ICD-10-CM | POA: Insufficient documentation

## 2013-10-11 DIAGNOSIS — Z923 Personal history of irradiation: Secondary | ICD-10-CM | POA: Insufficient documentation

## 2013-10-11 DIAGNOSIS — C20 Malignant neoplasm of rectum: Secondary | ICD-10-CM | POA: Insufficient documentation

## 2013-10-11 DIAGNOSIS — M1990 Unspecified osteoarthritis, unspecified site: Secondary | ICD-10-CM | POA: Insufficient documentation

## 2013-10-11 DIAGNOSIS — C189 Malignant neoplasm of colon, unspecified: Secondary | ICD-10-CM

## 2013-10-11 DIAGNOSIS — N39490 Overflow incontinence: Secondary | ICD-10-CM | POA: Insufficient documentation

## 2013-10-11 DIAGNOSIS — K7689 Other specified diseases of liver: Secondary | ICD-10-CM | POA: Insufficient documentation

## 2013-10-11 DIAGNOSIS — Z79899 Other long term (current) drug therapy: Secondary | ICD-10-CM | POA: Insufficient documentation

## 2013-10-11 HISTORY — PX: PORTACATH PLACEMENT: SHX2246

## 2013-10-11 SURGERY — INSERTION, TUNNELED CENTRAL VENOUS DEVICE, WITH PORT
Anesthesia: General | Site: Chest | Laterality: Left

## 2013-10-11 MED ORDER — HYDROMORPHONE HCL PF 1 MG/ML IJ SOLN
INTRAMUSCULAR | Status: AC
  Filled 2013-10-11: qty 1

## 2013-10-11 MED ORDER — MIDAZOLAM HCL 5 MG/5ML IJ SOLN
INTRAMUSCULAR | Status: DC | PRN
  Administered 2013-10-11: 2 mg via INTRAVENOUS

## 2013-10-11 MED ORDER — OXYCODONE HCL 5 MG/5ML PO SOLN
5.0000 mg | Freq: Once | ORAL | Status: DC | PRN
  Filled 2013-10-11: qty 5

## 2013-10-11 MED ORDER — FENTANYL CITRATE 0.05 MG/ML IJ SOLN
INTRAMUSCULAR | Status: AC
  Filled 2013-10-11: qty 2

## 2013-10-11 MED ORDER — DEXAMETHASONE SODIUM PHOSPHATE 10 MG/ML IJ SOLN
INTRAMUSCULAR | Status: DC | PRN
  Administered 2013-10-11: 10 mg via INTRAVENOUS

## 2013-10-11 MED ORDER — ONDANSETRON HCL 4 MG/2ML IJ SOLN
INTRAMUSCULAR | Status: AC
  Filled 2013-10-11: qty 2

## 2013-10-11 MED ORDER — LIDOCAINE HCL 1 % IJ SOLN
INTRAMUSCULAR | Status: AC
  Filled 2013-10-11: qty 20

## 2013-10-11 MED ORDER — CEFAZOLIN SODIUM-DEXTROSE 2-3 GM-% IV SOLR
2.0000 g | INTRAVENOUS | Status: AC
  Administered 2013-10-11: 2 g via INTRAVENOUS

## 2013-10-11 MED ORDER — LIDOCAINE HCL (CARDIAC) 20 MG/ML IV SOLN
INTRAVENOUS | Status: DC | PRN
  Administered 2013-10-11: 100 mg via INTRAVENOUS

## 2013-10-11 MED ORDER — CEFAZOLIN SODIUM-DEXTROSE 2-3 GM-% IV SOLR
INTRAVENOUS | Status: AC
  Filled 2013-10-11: qty 50

## 2013-10-11 MED ORDER — HEPARIN SOD (PORK) LOCK FLUSH 100 UNIT/ML IV SOLN
INTRAVENOUS | Status: AC
  Filled 2013-10-11: qty 5

## 2013-10-11 MED ORDER — PROPOFOL 10 MG/ML IV BOLUS
INTRAVENOUS | Status: DC | PRN
  Administered 2013-10-11: 200 mg via INTRAVENOUS

## 2013-10-11 MED ORDER — PROPOFOL 10 MG/ML IV BOLUS
INTRAVENOUS | Status: AC
  Filled 2013-10-11: qty 20

## 2013-10-11 MED ORDER — BUPIVACAINE HCL (PF) 0.25 % IJ SOLN
INTRAMUSCULAR | Status: AC
  Filled 2013-10-11: qty 30

## 2013-10-11 MED ORDER — SODIUM CHLORIDE 0.9 % IR SOLN
Status: DC | PRN
  Administered 2013-10-11: 11:00:00

## 2013-10-11 MED ORDER — PROMETHAZINE HCL 25 MG/ML IJ SOLN: 6.2500 mg | INTRAMUSCULAR | Status: DC | PRN

## 2013-10-11 MED ORDER — HYDROMORPHONE HCL PF 1 MG/ML IJ SOLN
0.2500 mg | INTRAMUSCULAR | Status: DC | PRN
  Administered 2013-10-11 (×4): 0.5 mg via INTRAVENOUS

## 2013-10-11 MED ORDER — SCOPOLAMINE 1 MG/3DAYS TD PT72
MEDICATED_PATCH | TRANSDERMAL | Status: AC
  Filled 2013-10-11: qty 1

## 2013-10-11 MED ORDER — MIDAZOLAM HCL 2 MG/2ML IJ SOLN
INTRAMUSCULAR | Status: AC
  Filled 2013-10-11: qty 2

## 2013-10-11 MED ORDER — MEPERIDINE HCL 50 MG/ML IJ SOLN: 6.2500 mg | INTRAMUSCULAR | Status: DC | PRN

## 2013-10-11 MED ORDER — LIDOCAINE HCL (CARDIAC) 20 MG/ML IV SOLN
INTRAVENOUS | Status: AC
  Filled 2013-10-11: qty 5

## 2013-10-11 MED ORDER — FENTANYL CITRATE 0.05 MG/ML IJ SOLN
INTRAMUSCULAR | Status: DC | PRN
  Administered 2013-10-11 (×2): 50 ug via INTRAVENOUS

## 2013-10-11 MED ORDER — DEXAMETHASONE SODIUM PHOSPHATE 10 MG/ML IJ SOLN
INTRAMUSCULAR | Status: AC
  Filled 2013-10-11: qty 1

## 2013-10-11 MED ORDER — OXYCODONE HCL 5 MG PO TABS: 5.0000 mg | ORAL_TABLET | Freq: Once | ORAL | Status: DC | PRN

## 2013-10-11 MED ORDER — SODIUM CHLORIDE 0.9 % IR SOLN
Freq: Once | Status: DC
  Filled 2013-10-11: qty 1.2

## 2013-10-11 MED ORDER — SCOPOLAMINE 1 MG/3DAYS TD PT72
1.0000 | MEDICATED_PATCH | Freq: Once | TRANSDERMAL | Status: DC
  Administered 2013-10-11: 1.5 mg via TRANSDERMAL
  Filled 2013-10-11: qty 1

## 2013-10-11 MED ORDER — LACTATED RINGERS IV SOLN
INTRAVENOUS | Status: DC
  Administered 2013-10-11: 13:00:00 via INTRAVENOUS
  Administered 2013-10-11: 1000 mL via INTRAVENOUS

## 2013-10-11 MED ORDER — ONDANSETRON HCL 4 MG/2ML IJ SOLN
INTRAMUSCULAR | Status: DC | PRN
  Administered 2013-10-11: 4 mg via INTRAVENOUS

## 2013-10-11 MED ORDER — HEPARIN SOD (PORK) LOCK FLUSH 100 UNIT/ML IV SOLN
INTRAVENOUS | Status: DC | PRN
  Administered 2013-10-11: 500 [IU]

## 2013-10-11 SURGICAL SUPPLY — 37 items
APL SKNCLS STERI-STRIP NONHPOA (GAUZE/BANDAGES/DRESSINGS) ×1
BAG DECANTER FOR FLEXI CONT (MISCELLANEOUS) ×3 IMPLANT
BENZOIN TINCTURE PRP APPL 2/3 (GAUZE/BANDAGES/DRESSINGS) ×3 IMPLANT
BLADE HEX COATED 2.75 (ELECTRODE) ×3 IMPLANT
BLADE SURG 15 STRL LF DISP TIS (BLADE) ×1 IMPLANT
BLADE SURG 15 STRL SS (BLADE) ×3
CHLORAPREP W/TINT 10.5 ML (MISCELLANEOUS) ×3 IMPLANT
CLOSURE WOUND 1/2 X4 (GAUZE/BANDAGES/DRESSINGS) ×1
CLOSURE WOUND 1/4X4 (GAUZE/BANDAGES/DRESSINGS) ×1
DECANTER SPIKE VIAL GLASS SM (MISCELLANEOUS) ×3 IMPLANT
DRAPE C-ARM 42X120 X-RAY (DRAPES) ×3 IMPLANT
DRAPE LAPAROTOMY TRNSV 102X78 (DRAPE) ×3 IMPLANT
ELECT REM PT RETURN 9FT ADLT (ELECTROSURGICAL) ×3
ELECTRODE REM PT RTRN 9FT ADLT (ELECTROSURGICAL) ×1 IMPLANT
GAUZE SPONGE 2X2 8PLY STRL LF (GAUZE/BANDAGES/DRESSINGS) ×1 IMPLANT
GAUZE SPONGE 4X4 16PLY XRAY LF (GAUZE/BANDAGES/DRESSINGS) ×3 IMPLANT
GLOVE SURG SIGNA 7.5 PF LTX (GLOVE) ×3 IMPLANT
GOWN STRL REUS W/TWL XL LVL3 (GOWN DISPOSABLE) ×6 IMPLANT
KIT BASIN OR (CUSTOM PROCEDURE TRAY) ×3 IMPLANT
KIT PORT POWER 8FR ISP CVUE (Catheter) ×2 IMPLANT
KIT POWER CATH 8FR (Catheter) ×2 IMPLANT
NDL HYPO 25X1 1.5 SAFETY (NEEDLE) ×1 IMPLANT
NEEDLE HYPO 25X1 1.5 SAFETY (NEEDLE) ×3 IMPLANT
NS IRRIG 1000ML POUR BTL (IV SOLUTION) ×3 IMPLANT
PACK BASIC VI WITH GOWN DISP (CUSTOM PROCEDURE TRAY) ×3 IMPLANT
PENCIL BUTTON HOLSTER BLD 10FT (ELECTRODE) ×3 IMPLANT
SPONGE GAUZE 2X2 STER 10/PKG (GAUZE/BANDAGES/DRESSINGS)
SPONGE GAUZE 4X4 12PLY (GAUZE/BANDAGES/DRESSINGS) ×4 IMPLANT
STRIP CLOSURE SKIN 1/2X4 (GAUZE/BANDAGES/DRESSINGS) ×1 IMPLANT
STRIP CLOSURE SKIN 1/4X4 (GAUZE/BANDAGES/DRESSINGS) ×2 IMPLANT
SUT VIC AB 3-0 SH 18 (SUTURE) ×3 IMPLANT
SUT VIC AB 5-0 PS2 18 (SUTURE) ×3 IMPLANT
SYR 20CC LL (SYRINGE) ×3 IMPLANT
SYR BULB IRRIGATION 50ML (SYRINGE) ×2 IMPLANT
SYRINGE 10CC LL (SYRINGE) ×3 IMPLANT
TAPE CLOTH SURG 4X10 WHT LF (GAUZE/BANDAGES/DRESSINGS) ×2 IMPLANT
TOWEL OR 17X26 10 PK STRL BLUE (TOWEL DISPOSABLE) ×3 IMPLANT

## 2013-10-11 NOTE — Progress Notes (Signed)
Portable upright chest x-ray done. 

## 2013-10-11 NOTE — H&P (View-Only) (Signed)
[place], MD,  [address]  [place], [place]    [place] Phone:  [phone] FAX:  [phone]   Re:   [name] DOB:   1961-02-18 MRN:   [id number]  ASSESSMENT AND PLAN: 1. Laparoscopic-assisted low anterior colon resection with #29 EEA anastomosis. Appedectomy - 09/02/2013 - D. Adella Manolis   Exploratory lap, drainage of pelvic abscess, diverting loop ileostomy, flexible sigmoidoscopy- 09/13/2013 - D. [name]   For oncology, sees Dr. Essie Christine  She had neoadjuvant chemo/radiation therapy.   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 Margins clear.   Ostomy functioning.    Plan - power port placement next week.  Will need to schedule CT/rectal contrasts to check fistula.  She sees Dr. Benay Spice this Friday.  Will talk to Dr. Benay Spice about timing of checking her rectum with the start of her chemo.  Follow up with me will depend on power port placement, next x-ray study of rectum, start of chemotherapy and how she is doing.  Somewhere between 2 to 4 weeks.  1A. Open wound - clean - looks good today.   Dressing changes BID  2. DJD   Has seen Dr. Sherrian Divers  3. Anxiety  4. IUD - stuck   Sees Dr. Susette Racer  5. Indeterminate 6 mm liver lesion seen on CT scan  6. History of HELLP syndrome with first pregnancy  7. According to her husband, she does hear well without her hearing aids.  8. Urinary incontinence/retention   Dr. Chauncey Cruel. MacDiarmid's saw her Monday, 4/13.   She is still requiring self cath 4x per day at about 200 cc per cath.  She is feeling nothing and only has overflow incontinence. 9. Perineal numbness. Remote history of back surgery.   Dr. Sherrian Divers has seen.   By may exam the numbness is primarily on the left, goes down the left posterior thigh about 6 inches and left posterior area up from the rectum about 3 inches.    HISTORY OF PRESENT ILLNESS: Chief Complaint  Patient  presents with  . Follow-up   [name] is a 53 y.o. (DOB: December 19, 1960)  white  female who is a patient of NEAL,W RONALD, MD and comes to me today for follow up of LAR and rectal leak. She is accompanied with her husband.  [name] has had a rough time.  She is about the same as when she left the hospital.  She is off her antibiotics.  She has no appetite, but is eating and has gained a small amount of weight. Her ileostomy bag has leaked. Her lower midline wound is healing, but tender. She has made no progress in her bladder retention or perineal numbness. She is walking and looks better just looking at her.  Past Medical History  Diagnosis Date  . History of radiation therapy 06/18/13-07/26/49    rectal 50.4Gy total dose  . IUD (intrauterine device) in place 08-27-13    Mirena Implant inplace  . Arthritis 08-27-13    at present has ruptured disc- lower back-not an issue now  . Colon cancer 08-27-13    radiation /chemo -last ended 4 weeks(Dr. Benay Spice)  . PONV (postoperative nausea and vomiting)     scopalamine patch helped   Current Outpatient Prescriptions  Medication Sig Dispense Refill  . ALPRAZolam (XANAX) 0.5 MG tablet Take 0.5 mg by mouth at bedtime as needed for anxiety or sleep.       Marland Kitchen  amoxicillin-clavulanate (AUGMENTIN) 875-125 MG per tablet Take 1 tablet by mouth 2 (two) times daily.  9 tablet  0  . estradiol (VIVELLE-DOT) 0.05 MG/24HR Place 1 patch onto the skin 2 (two) times a week. Sundays and Wednesday      . hydrochlorothiazide (HYDRODIURIL) 25 MG tablet Take 25 mg by mouth daily as needed (edema or puffiness).       Marland Kitchen HYDROcodone-acetaminophen (NORCO/VICODIN) 5-325 MG per tablet Take 1 tablet by mouth every 6 (six) hours as needed for moderate pain.      Marland Kitchen ibuprofen (ADVIL,MOTRIN) 200 MG tablet Take 600 mg by mouth every 6 (six) hours as needed for moderate pain.       Marland Kitchen oxyCODONE-acetaminophen (ROXICET) 5-325 MG per tablet Take 1-2 tablets by mouth every 6 (six) hours  as needed.  40 tablet  0  . prochlorperazine (COMPAZINE) 10 MG tablet Take 10 mg by mouth every 6 (six) hours as needed for nausea or vomiting.      . progesterone (PROMETRIUM) 200 MG capsule Take 200 mg by mouth. Every 3 months      . sertraline (ZOLOFT) 50 MG tablet Take 50 mg by mouth every morning.       . Sodium Chloride Flush (NORMAL SALINE FLUSH) 0.9 % SOLN       . sulfamethoxazole-trimethoprim (BACTRIM DS) 800-160 MG per tablet Take 1 tablet by mouth 2 (two) times daily. Started 09/12/23 for 7 days supply      . TESTOSTERONE IM Inject into the muscle every 30 (thirty) days.        No current facility-administered medications for this visit.   SOCIAL HISTORY: Married. Her son has some graduate work in [place].  PHYSICAL EXAM: BP 112/78  Pulse 71  Temp(Src) 97.5 F (36.4 C) (Temporal)  Resp 16  Ht 5\' 4"  (1.626 m)  Wt 139 lb 9.6 oz (63.322 kg)  BMI 23.95 kg/m2  General: WN WF who is alert. HEENT: Normal. Pupils equal.  Neck: Supple. No mass.  No thyroid mass.   Lungs: Clear to auscultation and symmetric breath sounds. Heart:  RRR. No murmur or rub. Abdomen: Soft. No mass. RLQ ileostomy.  Lower midline incision - this is clean.  LLQ drain - scant output (<10cc per day) Rectal: Can squeeze sphincter.  Bu numb on left side from 3 inches above the left lateral rectum to 6 inches down the posterior left thigh. Extremities:  Her general strength of her lower extremities is good.  DATA REVIEWED: Epic notes.   [name], MD,  [facility], [address]  [place], [place]    [place] Phone:  [phone] FAX:  [phone]

## 2013-10-11 NOTE — Anesthesia Preprocedure Evaluation (Signed)
Anesthesia Evaluation  Patient identified by MRN, date of birth, ID band Patient awake    Reviewed: Allergy & Precautions, H&P , NPO status , Patient's Chart, lab work & pertinent test results  History of Anesthesia Complications (+) PONV and history of anesthetic complications  Airway Mallampati: II TM Distance: >3 FB Neck ROM: Full    Dental no notable dental hx.    Pulmonary neg pulmonary ROS,  breath sounds clear to auscultation  Pulmonary exam normal       Cardiovascular negative cardio ROS  Rhythm:Regular Rate:Normal     Neuro/Psych negative neurological ROS  negative psych ROS   GI/Hepatic negative GI ROS, Neg liver ROS,   Endo/Other  negative endocrine ROS  Renal/GU negative Renal ROS     Musculoskeletal negative musculoskeletal ROS (+)   Abdominal   Peds  Hematology negative hematology ROS (+)   Anesthesia Other Findings   Reproductive/Obstetrics negative OB ROS                           Anesthesia Physical  Anesthesia Plan  ASA: III and emergent  Anesthesia Plan: General   Post-op Pain Management:    Induction: Intravenous  Airway Management Planned: LMA  Additional Equipment:   Intra-op Plan:   Post-operative Plan: Extubation in OR  Informed Consent: I have reviewed the patients History and Physical, chart, labs and discussed the procedure including the risks, benefits and alternatives for the proposed anesthesia with the patient or authorized representative who has indicated his/her understanding and acceptance.   Dental advisory given  Plan Discussed with: CRNA  Anesthesia Plan Comments:         Anesthesia Quick Evaluation

## 2013-10-11 NOTE — Transfer of Care (Signed)
Immediate Anesthesia Transfer of Care Note  Patient: [name]  Procedure(s) Performed: Procedure(s): INSERTION PORT-A-CATH (Left)  Patient Location: PACU  Anesthesia Type:General  Level of Consciousness: sedated  Airway & Oxygen Therapy: Patient Spontanous Breathing and Patient connected to face mask oxygen  Post-op Assessment: Report given to PACU RN and Post -op Vital signs reviewed and stable  Post vital signs: Reviewed and stable  Complications: No apparent anesthesia complications

## 2013-10-11 NOTE — Discharge Instructions (Signed)
[facility] - DISCHARGE INSTRUCTIONS TO PATIENT  Activity:  Driving - May drive tomorrow.   Lifting - Take it easy for 2 or 3 days, then no limit.  Wound Care:   Leave dressings on for 2 days, then may remove the bandage and shower on Sunday.  Diet:  As tolerated.  Follow up appointment:  Call Dr. Pollie Friar office [facility]) at [phone] for an appointment in 3 to 4 weeks.  Medications and dosages:  Resume your home medications.  You have a prescription for:  Emla Cream  Call Dr. [name] or his office  [phone]) if you have:  Temperature greater than 100.4,  Persistent nausea and vomiting,  Severe uncontrolled pain,  Redness, tenderness, or signs of infection (pain, swelling, redness, odor or green/yellow discharge around the site),  Difficulty breathing, headache or visual disturbances,  Any other questions or concerns you may have after discharge.  In an emergency, call 911 or go to an Emergency Department at a nearby hospital.

## 2013-10-11 NOTE — Progress Notes (Signed)
Patient states there isn't anyway she can be pregnant because she has not had intercorse since before surgery

## 2013-10-11 NOTE — Progress Notes (Signed)
X-ray results noted 

## 2013-10-11 NOTE — Interval H&P Note (Signed)
History and Physical Interval Note:  10/11/2013 10:02 AM  [name]  has presented today for surgery, with the diagnosis of rectal cancer  The various methods of treatment have been discussed with the patient and family.  Her parents are with her.  Her husband is getting a colonoscopy.  After consideration of risks, benefits and other options for treatment, the patient has consented to  Procedure(s): INSERTION PORT-A-CATH (N/A) as a surgical intervention .  [name] history has been reviewed, patient examined, no change in status, stable for surgery.  I have reviewed the patient's chart and labs.  Questions were answered to the patient's satisfaction.     [name]

## 2013-10-11 NOTE — Op Note (Signed)
10/11/2013  11:29 AM  PATIENT:  [name], 53 y.o., female MRN: [id number] DOB: [birth year]  PREOP DIAGNOSIS:  rectal cancer, anticipate chemotherapy  POSTOP DIAGNOSIS:   Colon cancer  , anticipate chemotherapy  PROCEDURE:   Procedure(s): INSERTION PORT-A-CATH  ([place] Power Port)  SURGEON:   [name], M.D.  ANESTHESIA:   general  Anesthesiologist: Nickie Retort, MD CRNA: Lind Covert, CRNA  General  EBL:  minimal  ml  COUNTS CORRECT:  YES  INDICATIONS FOR PROCEDURE:  [name] is a 53 y.o. (DOB: 03/01/1961) white female whose primary care physician is NEAL,W RONALD, MD and comes for power port placement for the treatment of colon cancer.  Dr. Jacinto Reap. Benay Spice is her treating oncologist.   The indications and risks of the surgery were explained to the patient.  The risks include, but are not limited to, infection, bleeding, pneumothorax, nerve injury, and thrombosis of the vein.  OPERATIVE NOTE:  The patient was taken to Room #[facility] at [facility].  Anesthesia was provided by Anesthesiologist: Nickie Retort, MD CRNA: Lind Covert, CRNA.  At the beginning of the operation, the patient was given 2 gm Ancef, had a roll placed under her back, and had the upper chest/neck prepped with Chloroprep and draped.   A time out was held and the surgery checklist reviewed.   The patient was placed in Trendelenburg position. On the first stick, I hit the left subclavian artery.  The left subclavian vein was accessed with a 16 gauge needle and a guide wire threaded through the needle into the vein.  The position of the wire was checked with fluoroscopy.  I had to do a second threading of the silastic tubing, because the tubing flipped into the right IJ and I could not get it to flip back into the superior vena cava.   I then developed a pocket in the upper inner aspect of the left chest for the port reservoir.  I used the [employer] for venous access.  The reservoir  was sewn in place with a 3-0 Vicryl suture.  The reservoir had been flushed with dilute (10 units/cc) heparin.   I then passed the silastic tubing from the reservoir incision to the subclavian stick site and used the 8 French introducer to pass it into the vein.  The tip of the silastic catheter was position at the junction of the SVC and the right atrium under fluoroscopy.  The silastic catheter was then attached to the port with the bayonet device.     The entire port and tubing were checked with fluoroscopy and then the port was flushed with 4 cc of concentrated heparin (100 units/cc).   The wounds were then closed with 3-0 vicryl subcutaneous sutures and the skin closed with a 5-0 Vicryl suture.  The skin was painted with tincture of benzoin and steri-stripped.   The patient was transferred to the recovery room in good condition.  The sponge and needle count were correct at the end of the case.  A CXR is ordered for port placement and pending at the time of this note.  [name], MD, [facility] Pager: [phone] Office phone:  [phone]

## 2013-10-11 NOTE — Anesthesia Postprocedure Evaluation (Signed)
Anesthesia Post Note  Patient: [name]  Procedure(s) Performed: Procedure(s) (LRB): INSERTION PORT-A-CATH (Left)  Anesthesia type: General  Patient location: PACU  Post pain: Pain level controlled  Post assessment: Post-op Vital signs reviewed  Last Vitals: BP 107/58  Pulse 75  Temp(Src) 36.6 C (Oral)  Resp 16  SpO2 99%  Post vital signs: Reviewed  Level of consciousness: sedated  Complications: No apparent anesthesia complications

## 2013-10-13 ENCOUNTER — Other Ambulatory Visit: Payer: Self-pay | Admitting: Oncology

## 2013-10-14 ENCOUNTER — Ambulatory Visit
Admission: RE | Admit: 2013-10-14 | Discharge: 2013-10-14 | Disposition: A | Discharge status: Home / Self Care | Payer: 59 | Source: Ambulatory Visit | Attending: Surgery | Admitting: Surgery

## 2013-10-14 ENCOUNTER — Telehealth (INDEPENDENT_AMBULATORY_CARE_PROVIDER_SITE_OTHER): Payer: Self-pay | Admitting: General Surgery

## 2013-10-14 ENCOUNTER — Encounter (HOSPITAL_COMMUNITY): Payer: Self-pay | Admitting: Surgery

## 2013-10-14 DIAGNOSIS — C20 Malignant neoplasm of rectum: Secondary | ICD-10-CM

## 2013-10-14 MED ORDER — IOHEXOL 300 MG/ML  SOLN
100.0000 mL | Freq: Once | INTRAMUSCULAR | Status: AC | PRN
  Administered 2013-10-14: 100 mL via INTRAVENOUS

## 2013-10-14 NOTE — Telephone Encounter (Signed)
Please call about her test results

## 2013-10-15 ENCOUNTER — Other Ambulatory Visit (HOSPITAL_BASED_OUTPATIENT_CLINIC_OR_DEPARTMENT_OTHER): Payer: 59

## 2013-10-15 ENCOUNTER — Other Ambulatory Visit (INDEPENDENT_AMBULATORY_CARE_PROVIDER_SITE_OTHER): Payer: Self-pay

## 2013-10-15 ENCOUNTER — Ambulatory Visit (HOSPITAL_BASED_OUTPATIENT_CLINIC_OR_DEPARTMENT_OTHER): Payer: 59

## 2013-10-15 VITALS — BP 115/72 | HR 88 | Temp 99.2°F | Resp 18

## 2013-10-15 DIAGNOSIS — Z5111 Encounter for antineoplastic chemotherapy: Secondary | ICD-10-CM

## 2013-10-15 DIAGNOSIS — C20 Malignant neoplasm of rectum: Secondary | ICD-10-CM

## 2013-10-15 DIAGNOSIS — Z9049 Acquired absence of other specified parts of digestive tract: Secondary | ICD-10-CM

## 2013-10-15 LAB — COMPREHENSIVE METABOLIC PANEL (CC13)
ALT: 32 U/L (ref 0–55)
ANION GAP: 9 meq/L (ref 3–11)
AST: 24 U/L (ref 5–34)
Albumin: 3.3 g/dL — ABNORMAL LOW (ref 3.5–5.0)
Alkaline Phosphatase: 67 U/L (ref 40–150)
BUN: 12.9 mg/dL (ref 7.0–26.0)
CALCIUM: 9.9 mg/dL (ref 8.4–10.4)
CHLORIDE: 110 meq/L — AB (ref 98–109)
CO2: 25 meq/L (ref 22–29)
Creatinine: 0.8 mg/dL (ref 0.6–1.1)
Glucose: 86 mg/dl (ref 70–140)
Potassium: 3.8 mEq/L (ref 3.5–5.1)
Sodium: 143 mEq/L (ref 136–145)
Total Bilirubin: 0.26 mg/dL (ref 0.20–1.20)
Total Protein: 7 g/dL (ref 6.4–8.3)

## 2013-10-15 LAB — CBC WITH DIFFERENTIAL/PLATELET
BASO%: 0.6 % (ref 0.0–2.0)
BASOS ABS: 0 10*3/uL (ref 0.0–0.1)
EOS ABS: 0.3 10*3/uL (ref 0.0–0.5)
EOS%: 5.6 % (ref 0.0–7.0)
HEMATOCRIT: 35.6 % (ref 34.8–46.6)
HEMOGLOBIN: 12 g/dL (ref 11.6–15.9)
LYMPH%: 24.6 % (ref 14.0–49.7)
MCH: 30.7 pg (ref 25.1–34.0)
MCHC: 33.6 g/dL (ref 31.5–36.0)
MCV: 91.5 fL (ref 79.5–101.0)
MONO#: 0.4 10*3/uL (ref 0.1–0.9)
MONO%: 8.3 % (ref 0.0–14.0)
NEUT#: 2.8 10*3/uL (ref 1.5–6.5)
NEUT%: 60.9 % (ref 38.4–76.8)
PLATELETS: 249 10*3/uL (ref 145–400)
RBC: 3.89 10*6/uL (ref 3.70–5.45)
RDW: 15 % — ABNORMAL HIGH (ref 11.2–14.5)
WBC: 4.5 10*3/uL (ref 3.9–10.3)
lymph#: 1.1 10*3/uL (ref 0.9–3.3)

## 2013-10-15 MED ORDER — ONDANSETRON 8 MG/NS 50 ML IVPB
INTRAVENOUS | Status: AC
  Filled 2013-10-15: qty 8

## 2013-10-15 MED ORDER — DEXAMETHASONE SODIUM PHOSPHATE 10 MG/ML IJ SOLN
10.0000 mg | Freq: Once | INTRAMUSCULAR | Status: AC
  Administered 2013-10-15: 10 mg via INTRAVENOUS

## 2013-10-15 MED ORDER — FLUOROURACIL CHEMO INJECTION 2.5 GM/50ML
400.0000 mg/m2 | Freq: Once | INTRAVENOUS | Status: AC
  Administered 2013-10-15: 700 mg via INTRAVENOUS
  Filled 2013-10-15: qty 14

## 2013-10-15 MED ORDER — LEUCOVORIN CALCIUM INJECTION 350 MG
400.0000 mg/m2 | Freq: Once | INTRAVENOUS | Status: AC
  Administered 2013-10-15: 700 mg via INTRAVENOUS
  Filled 2013-10-15: qty 35

## 2013-10-15 MED ORDER — FLUOROURACIL CHEMO INJECTION 5 GM/100ML
2400.0000 mg/m2 | INTRAVENOUS | Status: DC
  Administered 2013-10-15: 4050 mg via INTRAVENOUS
  Filled 2013-10-15: qty 81

## 2013-10-15 MED ORDER — DEXTROSE 5 % IV SOLN
Freq: Once | INTRAVENOUS | Status: AC
  Administered 2013-10-15: 12:00:00 via INTRAVENOUS

## 2013-10-15 MED ORDER — ONDANSETRON 8 MG/50ML IVPB (CHCC)
8.0000 mg | Freq: Once | INTRAVENOUS | Status: AC
  Administered 2013-10-15: 8 mg via INTRAVENOUS

## 2013-10-15 MED ORDER — DEXAMETHASONE SODIUM PHOSPHATE 10 MG/ML IJ SOLN
INTRAMUSCULAR | Status: AC
  Filled 2013-10-15: qty 1

## 2013-10-15 MED ORDER — OXALIPLATIN CHEMO INJECTION 100 MG/20ML
85.0000 mg/m2 | Freq: Once | INTRAVENOUS | Status: AC
  Administered 2013-10-15: 145 mg via INTRAVENOUS
  Filled 2013-10-15: qty 29

## 2013-10-15 NOTE — [facility] Oncology Navigator Note (Signed)
Met with patient during her infusion.  This is her first treatment.  Reviewed side effects and how to manage.  She verbalized understanding.  She requests to be seen by [facility] nurse due to having trouble with leakage of ileostomy.  This RN spoke with Maudie Flakes and arranged for patient to be seen on 10/17/13 when she returns for pump disconnection.  WOC RN to be paged at [phone] on 10/17/13 when patient arrives.  Patient aware and is okay with this plan.  She denies skin breakdown and understands to call if problems arise prior to 10/17/13.  Will continue to follow.

## 2013-10-15 NOTE — Patient Instructions (Signed)
[facility] Discharge Instructions for Patients Receiving Chemotherapy  Today you received the following chemotherapy agents Oxaliplatin, Leucovorin and 5FU.  To help prevent nausea and vomiting after your treatment, we encourage you to take your nausea medication.   If you develop nausea and vomiting that is not controlled by your nausea medication, call the clinic.   BELOW ARE SYMPTOMS THAT SHOULD BE REPORTED IMMEDIATELY:  *FEVER GREATER THAN 100.5 F  *CHILLS WITH OR WITHOUT FEVER  NAUSEA AND VOMITING THAT IS NOT CONTROLLED WITH YOUR NAUSEA MEDICATION  *UNUSUAL SHORTNESS OF BREATH  *UNUSUAL BRUISING OR BLEEDING  TENDERNESS IN MOUTH AND THROAT WITH OR WITHOUT PRESENCE OF ULCERS  *URINARY PROBLEMS  *BOWEL PROBLEMS  Oxaliplatin Injection What is this medicine? OXALIPLATIN (ox AL i PLA tin) is a chemotherapy drug. It targets fast dividing cells, like cancer cells, and causes these cells to die. This medicine is used to treat cancers of the colon and rectum, and many other cancers. This medicine may be used for other purposes; ask your health care provider or pharmacist if you have questions. COMMON BRAND NAME(S): Eloxatin What should I tell my health care provider before I take this medicine? They need to know if you have any of these conditions: -kidney disease -an unusual or allergic reaction to oxaliplatin, other chemotherapy, other medicines, foods, dyes, or preservatives -pregnant or trying to get pregnant -breast-feeding How should I use this medicine? This drug is given as an infusion into a vein. It is administered in a hospital or clinic by a specially trained health care professional. Talk to your pediatrician regarding the use of this medicine in children. Special care may be needed. Overdosage: If you think you have taken too much of this medicine contact a poison control center or emergency room at once. NOTE: This medicine is only for you. Do not  share this medicine with others. What if I miss a dose? It is important not to miss a dose. Call your doctor or health care professional if you are unable to keep an appointment. What may interact with this medicine? -medicines to increase blood counts like filgrastim, pegfilgrastim, sargramostim -probenecid -some antibiotics like amikacin, gentamicin, neomycin, polymyxin B, streptomycin, tobramycin -zalcitabine Talk to your doctor or health care professional before taking any of these medicines: -acetaminophen -aspirin -ibuprofen -ketoprofen -naproxen This list may not describe all possible interactions. Give your health care provider a list of all the medicines, herbs, non-prescription drugs, or dietary supplements you use. Also tell them if you smoke, drink alcohol, or use illegal drugs. Some items may interact with your medicine. What should I watch for while using this medicine? Your condition will be monitored carefully while you are receiving this medicine. You will need important blood work done while you are taking this medicine. This medicine can make you more sensitive to cold. Do not drink cold drinks or use ice. Cover exposed skin before coming in contact with cold temperatures or cold objects. When out in cold weather wear warm clothing and cover your mouth and nose to warm the air that goes into your lungs. Tell your doctor if you get sensitive to the cold. This drug may make you feel generally unwell. This is not uncommon, as chemotherapy can affect healthy cells as well as cancer cells. Report any side effects. Continue your course of treatment even though you feel ill unless your doctor tells you to stop. In some cases, you may be given additional medicines to help with side effects.  Follow all directions for their use. Call your doctor or health care professional for advice if you get a fever, chills or sore throat, or other symptoms of a cold or flu. Do not treat yourself.  This drug decreases your body's ability to fight infections. Try to avoid being around people who are sick. This medicine may increase your risk to bruise or bleed. Call your doctor or health care professional if you notice any unusual bleeding. Be careful brushing and flossing your teeth or using a toothpick because you may get an infection or bleed more easily. If you have any dental work done, tell your dentist you are receiving this medicine. Avoid taking products that contain aspirin, acetaminophen, ibuprofen, naproxen, or ketoprofen unless instructed by your doctor. These medicines may hide a fever. Do not become pregnant while taking this medicine. Women should inform their doctor if they wish to become pregnant or think they might be pregnant. There is a potential for serious side effects to an unborn child. Talk to your health care professional or pharmacist for more information. Do not breast-feed an infant while taking this medicine. Call your doctor or health care professional if you get diarrhea. Do not treat yourself. What side effects may I notice from receiving this medicine? Side effects that you should report to your doctor or health care professional as soon as possible: -allergic reactions like skin rash, itching or hives, swelling of the face, lips, or tongue -low blood counts - This drug may decrease the number of white blood cells, red blood cells and platelets. You may be at increased risk for infections and bleeding. -signs of infection - fever or chills, cough, sore throat, pain or difficulty passing urine -signs of decreased platelets or bleeding - bruising, pinpoint red spots on the skin, black, tarry stools, nosebleeds -signs of decreased red blood cells - unusually weak or tired, fainting spells, lightheadedness -breathing problems -chest pain, pressure -cough -diarrhea -jaw tightness -mouth sores -nausea and vomiting -pain, swelling, redness or irritation at the  injection site -pain, tingling, numbness in the hands or feet -problems with balance, talking, walking -redness, blistering, peeling or loosening of the skin, including inside the mouth -trouble passing urine or change in the amount of urine Side effects that usually do not require medical attention (report to your doctor or health care professional if they continue or are bothersome): -changes in vision -constipation -hair loss -loss of appetite -metallic taste in the mouth or changes in taste -stomach pain This list may not describe all possible side effects. Call your doctor for medical advice about side effects. You may report side effects to FDA at 1-800-FDA-1088. Where should I keep my medicine? This drug is given in a hospital or clinic and will not be stored at home. NOTE: This sheet is a summary. It may not cover all possible information. If you have questions about this medicine, talk to your doctor, pharmacist, or health care provider.  2014, Elsevier/Gold Standard. (2008-01-01 17:22:47)   UNUSUAL RASH Items with * indicate a potential emergency and should be followed up as soon as possible.  Feel free to call the clinic you have any questions or concerns. The clinic phone number is [phone].  Leucovorin injection What is this medicine? LEUCOVORIN (loo koe VOR in) is used to prevent or treat the harmful effects of some medicines. This medicine is used to treat anemia caused by a low amount of folic acid in the body. It is also used with 5-fluorouracil (  5-FU) to treat colon cancer. This medicine may be used for other purposes; ask your health care provider or pharmacist if you have questions. What should I tell my health care provider before I take this medicine? They need to know if you have any of these conditions: -anemia from low levels of vitamin B-12 in the blood -an unusual or allergic reaction to leucovorin, folic acid, other medicines, foods, dyes, or  preservatives -pregnant or trying to get pregnant -breast-feeding How should I use this medicine? This medicine is for injection into a muscle or into a vein. It is given by a health care professional in a hospital or clinic setting. Talk to your pediatrician regarding the use of this medicine in children. Special care may be needed. Overdosage: If you think you have taken too much of this medicine contact a poison control center or emergency room at once. NOTE: This medicine is only for you. Do not share this medicine with others. What if I miss a dose? This does not apply. What may interact with this medicine? -capecitabine -fluorouracil -phenobarbital -phenytoin -primidone -trimethoprim-sulfamethoxazole This list may not describe all possible interactions. Give your health care provider a list of all the medicines, herbs, non-prescription drugs, or dietary supplements you use. Also tell them if you smoke, drink alcohol, or use illegal drugs. Some items may interact with your medicine. What should I watch for while using this medicine? Your condition will be monitored carefully while you are receiving this medicine. This medicine may increase the side effects of 5-fluorouracil, 5-FU. Tell your doctor or health care professional if you have diarrhea or mouth sores that do not get better or that get worse. What side effects may I notice from receiving this medicine? Side effects that you should report to your doctor or health care professional as soon as possible: -allergic reactions like skin rash, itching or hives, swelling of the face, lips, or tongue -breathing problems -fever, infection -mouth sores -unusual bleeding or bruising -unusually weak or tired Side effects that usually do not require medical attention (report to your doctor or health care professional if they continue or are bothersome): -constipation or diarrhea -loss of appetite -nausea, vomiting This list may not  describe all possible side effects. Call your doctor for medical advice about side effects. You may report side effects to FDA at 1-800-FDA-1088. Where should I keep my medicine? This drug is given in a hospital or clinic and will not be stored at home. NOTE: This sheet is a summary. It may not cover all possible information. If you have questions about this medicine, talk to your doctor, pharmacist, or health care provider.  2014, Elsevier/Gold Standard. (2007-12-11 16:50:29)  Fluorouracil, 5-FU injection What is this medicine? FLUOROURACIL, 5-FU (flure oh YOOR a sil) is a chemotherapy drug. It slows the growth of cancer cells. This medicine is used to treat many types of cancer like breast cancer, colon or rectal cancer, pancreatic cancer, and stomach cancer. This medicine may be used for other purposes; ask your health care provider or pharmacist if you have questions. COMMON BRAND NAME(S): Adrucil What should I tell my health care provider before I take this medicine? They need to know if you have any of these conditions: -blood disorders -dihydropyrimidine dehydrogenase (DPD) deficiency -infection (especially a virus infection such as chickenpox, cold sores, or herpes) -kidney disease -liver disease -malnourished, poor nutrition -recent or ongoing radiation therapy -an unusual or allergic reaction to fluorouracil, other chemotherapy, other medicines, foods, dyes, or preservatives -  pregnant or trying to get pregnant -breast-feeding How should I use this medicine? This drug is given as an infusion or injection into a vein. It is administered in a hospital or clinic by a specially trained health care professional. Talk to your pediatrician regarding the use of this medicine in children. Special care may be needed. Overdosage: If you think you have taken too much of this medicine contact a poison control center or emergency room at once. NOTE: This medicine is only for you. Do not share  this medicine with others. What if I miss a dose? It is important not to miss your dose. Call your doctor or health care professional if you are unable to keep an appointment. What may interact with this medicine? -allopurinol -cimetidine -dapsone -digoxin -hydroxyurea -leucovorin -levamisole -medicines for seizures like ethotoin, fosphenytoin, phenytoin -medicines to increase blood counts like filgrastim, pegfilgrastim, sargramostim -medicines that treat or prevent blood clots like warfarin, enoxaparin, and dalteparin -methotrexate -metronidazole -pyrimethamine -some other chemotherapy drugs like busulfan, cisplatin, estramustine, vinblastine -trimethoprim -trimetrexate -vaccines Talk to your doctor or health care professional before taking any of these medicines: -acetaminophen -aspirin -ibuprofen -ketoprofen -naproxen This list may not describe all possible interactions. Give your health care provider a list of all the medicines, herbs, non-prescription drugs, or dietary supplements you use. Also tell them if you smoke, drink alcohol, or use illegal drugs. Some items may interact with your medicine. What should I watch for while using this medicine? Visit your doctor for checks on your progress. This drug may make you feel generally unwell. This is not uncommon, as chemotherapy can affect healthy cells as well as cancer cells. Report any side effects. Continue your course of treatment even though you feel ill unless your doctor tells you to stop. In some cases, you may be given additional medicines to help with side effects. Follow all directions for their use. Call your doctor or health care professional for advice if you get a fever, chills or sore throat, or other symptoms of a cold or flu. Do not treat yourself. This drug decreases your body's ability to fight infections. Try to avoid being around people who are sick. This medicine may increase your risk to bruise or bleed.  Call your doctor or health care professional if you notice any unusual bleeding. Be careful brushing and flossing your teeth or using a toothpick because you may get an infection or bleed more easily. If you have any dental work done, tell your dentist you are receiving this medicine. Avoid taking products that contain aspirin, acetaminophen, ibuprofen, naproxen, or ketoprofen unless instructed by your doctor. These medicines may hide a fever. Do not become pregnant while taking this medicine. Women should inform their doctor if they wish to become pregnant or think they might be pregnant. There is a potential for serious side effects to an unborn child. Talk to your health care professional or pharmacist for more information. Do not breast-feed an infant while taking this medicine. Men should inform their doctor if they wish to father a child. This medicine may lower sperm counts. Do not treat diarrhea with over the counter products. Contact your doctor if you have diarrhea that lasts more than 2 days or if it is severe and watery. This medicine can make you more sensitive to the sun. Keep out of the sun. If you cannot avoid being in the sun, wear protective clothing and use sunscreen. Do not use sun lamps or tanning beds/booths. What side effects may  I notice from receiving this medicine? Side effects that you should report to your doctor or health care professional as soon as possible: -allergic reactions like skin rash, itching or hives, swelling of the face, lips, or tongue -low blood counts - this medicine may decrease the number of white blood cells, red blood cells and platelets. You may be at increased risk for infections and bleeding. -signs of infection - fever or chills, cough, sore throat, pain or difficulty passing urine -signs of decreased platelets or bleeding - bruising, pinpoint red spots on the skin, black, tarry stools, blood in the urine -signs of decreased red blood cells -  unusually weak or tired, fainting spells, lightheadedness -breathing problems -changes in vision -chest pain -mouth sores -nausea and vomiting -pain, swelling, redness at site where injected -pain, tingling, numbness in the hands or feet -redness, swelling, or sores on hands or feet -stomach pain -unusual bleeding Side effects that usually do not require medical attention (report to your doctor or health care professional if they continue or are bothersome): -changes in finger or toe nails -diarrhea -dry or itchy skin -hair loss -headache -loss of appetite -sensitivity of eyes to the light -stomach upset -unusually teary eyes This list may not describe all possible side effects. Call your doctor for medical advice about side effects. You may report side effects to FDA at 1-800-FDA-1088. Where should I keep my medicine? This drug is given in a hospital or clinic and will not be stored at home. NOTE: This sheet is a summary. It may not cover all possible information. If you have questions about this medicine, talk to your doctor, pharmacist, or health care provider.  2014, Elsevier/Gold Standard. (2007-10-10 13:53:16)

## 2013-10-15 NOTE — [facility] Oncology Navigator Note (Signed)
Spoke with [name], WOC RN,  at [facility] can see patient this afternoon.  Instructions/directions given to patient.  She expressed appreciation.

## 2013-10-16 ENCOUNTER — Telehealth: Payer: Self-pay | Admitting: *Deleted

## 2013-10-16 ENCOUNTER — Telehealth (INDEPENDENT_AMBULATORY_CARE_PROVIDER_SITE_OTHER): Payer: Self-pay

## 2013-10-16 NOTE — Telephone Encounter (Signed)
Spoke with pt today for post chemo follow up call.  Pt stated she slept fine last night with Xanax and pain med.  Pt has some discomfort in the rectal area which pain was relieved with pain meds.  Pt stated appetite fair;  Reinforced needs to increase po fluids as tolerated.  Gave pt varieties of fluid intake.  Pt has ostomy with good output.  Bladder functions fine.  Pt aware of next return appts. Pt stated she had good experience with her first chemo yesterday.  Staff were courteous, nice, and provide explanations to pt throughout her treatment.  No other concerns at this time.

## 2013-10-16 NOTE — Telephone Encounter (Signed)
Fistulogram @ [facility] 10-21-13@10am  Patient aware

## 2013-10-17 ENCOUNTER — Ambulatory Visit (HOSPITAL_BASED_OUTPATIENT_CLINIC_OR_DEPARTMENT_OTHER): Payer: 59

## 2013-10-17 ENCOUNTER — Telehealth: Payer: Self-pay | Admitting: *Deleted

## 2013-10-17 VITALS — BP 110/73 | HR 80

## 2013-10-17 DIAGNOSIS — C20 Malignant neoplasm of rectum: Secondary | ICD-10-CM

## 2013-10-17 MED ORDER — SODIUM CHLORIDE 0.9 % IJ SOLN
10.0000 mL | INTRAMUSCULAR | Status: DC | PRN
  Administered 2013-10-17: 10 mL
  Filled 2013-10-17: qty 10

## 2013-10-17 MED ORDER — HEPARIN SOD (PORK) LOCK FLUSH 100 UNIT/ML IV SOLN
500.0000 [IU] | Freq: Once | INTRAVENOUS | Status: AC | PRN
  Administered 2013-10-17: 500 [IU]
  Filled 2013-10-17: qty 5

## 2013-10-17 MED ORDER — ONDANSETRON 8 MG PO TBDP: 8.0000 mg | ORAL_TABLET | Freq: Three times a day (TID) | ORAL | Status: DC | PRN

## 2013-10-17 NOTE — Telephone Encounter (Signed)
Message from pt's husband reporting severe nausea. Requesting alternative antiemetic as Compazine is not helping. Reviewed with Ned Card, NP: order received. Returned call to Mr. Balan with instructions on use and to call office if this is ineffective. He voiced understanding.

## 2013-10-21 ENCOUNTER — Ambulatory Visit (HOSPITAL_COMMUNITY)
Admission: RE | Admit: 2013-10-21 | Discharge: 2013-10-21 | Disposition: A | Discharge status: Home / Self Care | Payer: 59 | Source: Ambulatory Visit | Attending: Surgery | Admitting: Surgery

## 2013-10-21 ENCOUNTER — Encounter: Payer: Self-pay | Admitting: Oncology

## 2013-10-21 DIAGNOSIS — L02219 Cutaneous abscess of trunk, unspecified: Secondary | ICD-10-CM | POA: Insufficient documentation

## 2013-10-21 DIAGNOSIS — Z85048 Personal history of other malignant neoplasm of rectum, rectosigmoid junction, and anus: Secondary | ICD-10-CM | POA: Insufficient documentation

## 2013-10-21 DIAGNOSIS — L03319 Cellulitis of trunk, unspecified: Principal | ICD-10-CM

## 2013-10-21 MED ORDER — IOHEXOL 300 MG/ML  SOLN
50.0000 mL | Freq: Once | INTRAMUSCULAR | Status: AC | PRN
  Administered 2013-10-21: 50 mL via INTRATHECAL

## 2013-10-21 NOTE — Progress Notes (Signed)
  Radiation Oncology         [phone] ________________________________  Name: [name] MRN: [id number]  Date: 06/07/2013  DOB: [birth year]  Optical Surface Tracking Plan:  Since intensity modulated radiotherapy (IMRT) and 3D conformal radiation treatment methods are predicated on accurate and precise positioning for treatment, intrafraction motion monitoring is medically necessary to ensure accurate and safe treatment delivery.  The ability to quantify intrafraction motion without excessive ionizing radiation dose can only be performed with optical surface tracking. Accordingly, surface imaging offers the opportunity to obtain 3D measurements of patient position throughout IMRT and 3D treatments without excessive radiation exposure.  I am ordering optical surface tracking for this patient's upcoming course of radiotherapy. ________________________________  Marye Round, MD 10/21/2013 11:09 AM    Reference:   Particia Jasper, et al. Surface imaging-based analysis of intrafraction motion for breast radiotherapy patients.Journal of [place], n. 6, nov. 2014. ISSN [id number].   Available at: <http://www.jacmp.org/index.php/jacmp/article/view/4957>.

## 2013-10-21 NOTE — Addendum Note (Signed)
Encounter addended by: Marye Round, MD on: 10/21/2013 11:10 AM<BR>     Documentation filed: Notes Section

## 2013-10-21 NOTE — Progress Notes (Signed)
Put disability form on nurse's desk. °

## 2013-10-22 ENCOUNTER — Other Ambulatory Visit: Payer: Self-pay | Admitting: *Deleted

## 2013-10-22 ENCOUNTER — Encounter: Payer: Self-pay | Admitting: Oncology

## 2013-10-22 ENCOUNTER — Telehealth (INDEPENDENT_AMBULATORY_CARE_PROVIDER_SITE_OTHER): Payer: Self-pay

## 2013-10-22 ENCOUNTER — Telehealth: Payer: Self-pay | Admitting: *Deleted

## 2013-10-22 DIAGNOSIS — C20 Malignant neoplasm of rectum: Secondary | ICD-10-CM

## 2013-10-22 MED ORDER — OXYCODONE-ACETAMINOPHEN 5-325 MG PO TABS: 1.0000 | ORAL_TABLET | Freq: Four times a day (QID) | ORAL | Status: DC | PRN

## 2013-10-22 MED ORDER — PROCHLORPERAZINE MALEATE 10 MG PO TABS: 10.0000 mg | ORAL_TABLET | Freq: Four times a day (QID) | ORAL | Status: DC | PRN

## 2013-10-22 NOTE — Telephone Encounter (Signed)
Call received with request for compazine and percocet.  Reports pain to her bottom and pain with chemotherapy.  "The pain meds help me tolerate the chemotherapy."  Will notify providers.  [name] can be reached at [phone].

## 2013-10-22 NOTE — Progress Notes (Signed)
Faxed disability form to [employer] @ [phone] °

## 2013-10-22 NOTE — Telephone Encounter (Signed)
Called [phone] to notify patient of refill authorization.  Instructions to bring picture ID to sign to pick up included in this message with instructions to call if any questions.

## 2013-10-22 NOTE — Telephone Encounter (Signed)
V/M Nurse only; Patient to call to have drain removed per [name] , MD to monitor to be sure drain does not break into patients abdomen while being removed .

## 2013-10-23 ENCOUNTER — Ambulatory Visit (INDEPENDENT_AMBULATORY_CARE_PROVIDER_SITE_OTHER): Payer: 59 | Admitting: General Surgery

## 2013-10-23 ENCOUNTER — Encounter (INDEPENDENT_AMBULATORY_CARE_PROVIDER_SITE_OTHER): Payer: Self-pay

## 2013-10-23 DIAGNOSIS — Z4803 Encounter for change or removal of drains: Secondary | ICD-10-CM

## 2013-10-23 NOTE — Progress Notes (Signed)
Patient came in to have her per drain catheter removed.  She has a h/o colorectal cancer w/ presacral abscess. After informing the patient of the procedure and received consent, I cleansed the skin with chlorhexidine.  I then removed the retaining suture.  I removed the drain and then placed sterile 4x4 gauze and medical tape over the incision.  Instructed the patient that she could shower tomorrow and allow warm soapy water to run over the opening and then pat dry and recover.  Educated her on drainage that could occur and what signs and symptoms to look out for on infection.  The patient verbalized understanding.  She had no c/o fever, abd pain, N/V.  Next appt date of 5/20 was given.

## 2013-10-27 ENCOUNTER — Other Ambulatory Visit: Payer: Self-pay | Admitting: Oncology

## 2013-10-29 ENCOUNTER — Ambulatory Visit (HOSPITAL_BASED_OUTPATIENT_CLINIC_OR_DEPARTMENT_OTHER): Payer: 59

## 2013-10-29 ENCOUNTER — Other Ambulatory Visit (HOSPITAL_BASED_OUTPATIENT_CLINIC_OR_DEPARTMENT_OTHER): Payer: 59

## 2013-10-29 ENCOUNTER — Telehealth: Payer: Self-pay | Admitting: Oncology

## 2013-10-29 ENCOUNTER — Ambulatory Visit (HOSPITAL_BASED_OUTPATIENT_CLINIC_OR_DEPARTMENT_OTHER): Payer: 59 | Admitting: Oncology

## 2013-10-29 VITALS — BP 111/65 | HR 85 | Temp 98.2°F | Resp 20 | Ht 64.0 in | Wt 140.8 lb

## 2013-10-29 DIAGNOSIS — C20 Malignant neoplasm of rectum: Secondary | ICD-10-CM

## 2013-10-29 DIAGNOSIS — Z5111 Encounter for antineoplastic chemotherapy: Secondary | ICD-10-CM

## 2013-10-29 DIAGNOSIS — K7689 Other specified diseases of liver: Secondary | ICD-10-CM

## 2013-10-29 DIAGNOSIS — R339 Retention of urine, unspecified: Secondary | ICD-10-CM

## 2013-10-29 DIAGNOSIS — K6289 Other specified diseases of anus and rectum: Secondary | ICD-10-CM

## 2013-10-29 DIAGNOSIS — D702 Other drug-induced agranulocytosis: Secondary | ICD-10-CM

## 2013-10-29 DIAGNOSIS — N9089 Other specified noninflammatory disorders of vulva and perineum: Secondary | ICD-10-CM

## 2013-10-29 DIAGNOSIS — R109 Unspecified abdominal pain: Secondary | ICD-10-CM

## 2013-10-29 LAB — CBC WITH DIFFERENTIAL/PLATELET
BASO%: 1.1 % (ref 0.0–2.0)
BASOS ABS: 0 10*3/uL (ref 0.0–0.1)
EOS%: 10.8 % — ABNORMAL HIGH (ref 0.0–7.0)
Eosinophils Absolute: 0.3 10*3/uL (ref 0.0–0.5)
HCT: 35.4 % (ref 34.8–46.6)
HEMOGLOBIN: 11.9 g/dL (ref 11.6–15.9)
LYMPH#: 0.6 10*3/uL — AB (ref 0.9–3.3)
LYMPH%: 22.4 % (ref 14.0–49.7)
MCH: 30.6 pg (ref 25.1–34.0)
MCHC: 33.6 g/dL (ref 31.5–36.0)
MCV: 91.1 fL (ref 79.5–101.0)
MONO#: 0.3 10*3/uL (ref 0.1–0.9)
MONO%: 11.8 % (ref 0.0–14.0)
NEUT#: 1.4 10*3/uL — ABNORMAL LOW (ref 1.5–6.5)
NEUT%: 53.9 % (ref 38.4–76.8)
Platelets: 223 10*3/uL (ref 145–400)
RBC: 3.89 10*6/uL (ref 3.70–5.45)
RDW: 14.7 % — AB (ref 11.2–14.5)
WBC: 2.7 10*3/uL — AB (ref 3.9–10.3)

## 2013-10-29 LAB — COMPREHENSIVE METABOLIC PANEL (CC13)
ALBUMIN: 3.4 g/dL — AB (ref 3.5–5.0)
ALT: 42 U/L (ref 0–55)
AST: 23 U/L (ref 5–34)
Alkaline Phosphatase: 75 U/L (ref 40–150)
Anion Gap: 12 mEq/L — ABNORMAL HIGH (ref 3–11)
BUN: 11.5 mg/dL (ref 7.0–26.0)
CALCIUM: 9.7 mg/dL (ref 8.4–10.4)
CHLORIDE: 110 meq/L — AB (ref 98–109)
CO2: 22 mEq/L (ref 22–29)
Creatinine: 0.7 mg/dL (ref 0.6–1.1)
Glucose: 96 mg/dl (ref 70–140)
POTASSIUM: 3.7 meq/L (ref 3.5–5.1)
Sodium: 145 mEq/L (ref 136–145)
TOTAL PROTEIN: 6.8 g/dL (ref 6.4–8.3)
Total Bilirubin: 0.24 mg/dL (ref 0.20–1.20)

## 2013-10-29 MED ORDER — DEXAMETHASONE SODIUM PHOSPHATE 10 MG/ML IJ SOLN
INTRAMUSCULAR | Status: AC
  Filled 2013-10-29: qty 1

## 2013-10-29 MED ORDER — SODIUM CHLORIDE 0.9 % IV SOLN
2400.0000 mg/m2 | INTRAVENOUS | Status: DC
  Administered 2013-10-29: 4050 mg via INTRAVENOUS
  Filled 2013-10-29: qty 81

## 2013-10-29 MED ORDER — LEUCOVORIN CALCIUM INJECTION 350 MG
414.0000 mg/m2 | Freq: Once | INTRAVENOUS | Status: AC
  Administered 2013-10-29: 700 mg via INTRAVENOUS
  Filled 2013-10-29: qty 35

## 2013-10-29 MED ORDER — PALONOSETRON HCL INJECTION 0.25 MG/5ML
0.2500 mg | Freq: Once | INTRAVENOUS | Status: AC
  Administered 2013-10-29: 0.25 mg via INTRAVENOUS

## 2013-10-29 MED ORDER — DEXTROSE 5 % IV SOLN
Freq: Once | INTRAVENOUS | Status: AC
  Administered 2013-10-29: 10:00:00 via INTRAVENOUS

## 2013-10-29 MED ORDER — OXALIPLATIN CHEMO INJECTION 100 MG/20ML
85.0000 mg/m2 | Freq: Once | INTRAVENOUS | Status: AC
  Administered 2013-10-29: 145 mg via INTRAVENOUS
  Filled 2013-10-29: qty 29

## 2013-10-29 MED ORDER — ALPRAZOLAM 0.5 MG PO TABS: 0.5000 mg | ORAL_TABLET | Freq: Every evening | ORAL | Status: DC | PRN

## 2013-10-29 MED ORDER — DEXAMETHASONE SODIUM PHOSPHATE 10 MG/ML IJ SOLN
10.0000 mg | Freq: Once | INTRAMUSCULAR | Status: AC
Start: 2013-10-29 — End: 2013-10-29
  Administered 2013-10-29: 10 mg via INTRAVENOUS

## 2013-10-29 MED ORDER — FLUOROURACIL CHEMO INJECTION 2.5 GM/50ML
400.0000 mg/m2 | Freq: Once | INTRAVENOUS | Status: AC
  Administered 2013-10-29: 700 mg via INTRAVENOUS
  Filled 2013-10-29: qty 14

## 2013-10-29 MED ORDER — PALONOSETRON HCL INJECTION 0.25 MG/5ML
INTRAVENOUS | Status: AC
  Filled 2013-10-29: qty 5

## 2013-10-29 MED ORDER — SODIUM CHLORIDE 0.9 % IV SOLN
150.0000 mg | Freq: Once | INTRAVENOUS | Status: AC
  Administered 2013-10-29: 150 mg via INTRAVENOUS
  Filled 2013-10-29: qty 5

## 2013-10-29 NOTE — Patient Instructions (Signed)
[facility] Discharge Instructions for Patients Receiving Chemotherapy  Today you received the following chemotherapy agents Leucovorin, Oxaliplatin and 5FU.  To help prevent nausea and vomiting after your treatment, we encourage you to take your nausea medication.   If you develop nausea and vomiting that is not controlled by your nausea medication, call the clinic.   BELOW ARE SYMPTOMS THAT SHOULD BE REPORTED IMMEDIATELY:  *FEVER GREATER THAN 100.5 F  *CHILLS WITH OR WITHOUT FEVER  NAUSEA AND VOMITING THAT IS NOT CONTROLLED WITH YOUR NAUSEA MEDICATION  *UNUSUAL SHORTNESS OF BREATH  *UNUSUAL BRUISING OR BLEEDING  TENDERNESS IN MOUTH AND THROAT WITH OR WITHOUT PRESENCE OF ULCERS  *URINARY PROBLEMS  *BOWEL PROBLEMS  UNUSUAL RASH Items with * indicate a potential emergency and should be followed up as soon as possible.  Feel free to call the clinic you have any questions or concerns. The clinic phone number is [phone].

## 2013-10-29 NOTE — Progress Notes (Signed)
  [place] OFFICE PROGRESS NOTE   Diagnosis: Rectal cancer  INTERVAL HISTORY:   [name] completed a first cycle of adjuvant FOLFOX 10/15/2013. She reports nausea lasting 4-5 days following chemotherapy. She had cold sensitivity following chemotherapy, this has resolved. The pelvic drain was removed last week. She continues to have numbness at the perineum and discomfort in the gluteus. She is now able to urinate. She performs self catheterization once daily for approximately 200 cc of urine. She empties the ileostomy bag 7 - 8 times per day. She continues to have soreness at the left upper chest.  Objective:  Vital signs in last 24 hours:  Blood pressure 111/65, pulse 85, temperature 98.2 F (36.8 C), temperature source Oral, resp. rate 20, height 5\' 4"  (1.626 m), weight 140 lb 12.8 oz (63.866 kg).    HEENT: No thrush or ulcers Resp: Lungs clear bilaterally Cardio: Regular rate and rhythm GI: No hepatomegaly, right lower quadrant ileostomy with liquid and formed stool Vascular: No leg edema    Portacath/PICC-without erythema  Lab Results:  Lab Results  Component Value Date   WBC 2.7* 10/29/2013   HGB 11.9 10/29/2013   HCT 35.4 10/29/2013   MCV 91.1 10/29/2013   PLT 223 10/29/2013   NEUTROABS 1.4* 10/29/2013      Imaging: CT pelvis 10/14/2013-decreased size of presacral abscess with percutaneous drain in place, no rectal contrast accumulates in the abscess collection.  10/21/2013-no evidence of fistula on a contrast injection of the drainage catheter 5 4915 Medications: I have reviewed the patient's current medications.  Assessment/Plan: 1. Rectal cancer, clinical stage III (T3 N1) status post biopsy of a rectal mass on 05/24/2013 confirming adenocarcinoma. Endoscopic ultrasound 05/31/2013 measured the mass at 11 cm from the anal verge, uT3uN1.  Initiation of radiation and concurrent Xeloda 06/18/2013; completion 07/26/2013.  Status post low anterior  resection 09/02/2013. Invasive adenocarcinoma, 3.7 cm, negative margins; metastatic carcinoma in 7 of 11 lymph nodes; extensive lymph vascular involvement by tumor. Cycle 1 adjuvant 10/15/2013 2. Post colonoscopy bowel perforation confirmed on abdomen CT 05/28/2013. 3. Single indeterminate liver lesion on the abdomen CT 05/28/2013. MRI on 06/18/2013 confirmed 2 tiny liver lesions, indeterminate-favored to be benign cysts. 4. History of cervical and lumbar disc disease.  5. History of HELLP syndrome with first pregnancy.  6. Delayed nausea following cycle 1 FOLFOX 7. History of pain with bowel movements. 8. Anastomotic leak on CT 09/13/2013. Status post exploratory laparotomy with drainage of a pelvic abscess, diverting loop ileostomy. Drainage catheter removed 10/23/2013 9. Perineal numbness, urinary retention. The urinary retention has improved 10. Abdominal and rectal pain. She is taking Percocet as needed. 11. Neutropenia secondary chemotherapy-she will receive Neulasta beginning with cycle 2 FOLFOX  Disposition:  [name] has completed one cycle of adjuvant FOLFOX. She had delayed nausea. Aloxi and Emend will be added with cycle 2. She has developed mild neutropenia. Neulasta will be added with cycle 2. We reviewed the potential toxicities associated with Neulasta including the chance for bone pain, a rash, and rupture of the spleen.  She will complete cycle 2 FOLFOX today. [name] will return for an office visit and cycle 3 on 11/14/2013.  Ladell Pier, MD  10/29/2013  5:19 PM

## 2013-10-29 NOTE — Progress Notes (Signed)
Ok to treat with ANC 1.4 per Dr. Sherrill.  

## 2013-10-29 NOTE — Telephone Encounter (Signed)
mailed pt appt sched/avs and letter °

## 2013-10-31 ENCOUNTER — Telehealth: Payer: Self-pay | Admitting: *Deleted

## 2013-10-31 ENCOUNTER — Ambulatory Visit (HOSPITAL_BASED_OUTPATIENT_CLINIC_OR_DEPARTMENT_OTHER): Payer: 59

## 2013-10-31 ENCOUNTER — Encounter: Payer: Self-pay | Admitting: *Deleted

## 2013-10-31 VITALS — BP 95/64 | HR 79

## 2013-10-31 DIAGNOSIS — D702 Other drug-induced agranulocytosis: Secondary | ICD-10-CM

## 2013-10-31 DIAGNOSIS — C20 Malignant neoplasm of rectum: Secondary | ICD-10-CM

## 2013-10-31 DIAGNOSIS — Z452 Encounter for adjustment and management of vascular access device: Secondary | ICD-10-CM

## 2013-10-31 MED ORDER — PEGFILGRASTIM INJECTION 6 MG/0.6ML
6.0000 mg | Freq: Once | SUBCUTANEOUS | Status: AC
  Administered 2013-10-31: 6 mg via SUBCUTANEOUS
  Filled 2013-10-31: qty 0.6

## 2013-10-31 MED ORDER — HEPARIN SOD (PORK) LOCK FLUSH 100 UNIT/ML IV SOLN
500.0000 [IU] | Freq: Once | INTRAVENOUS | Status: AC | PRN
  Administered 2013-10-31: 500 [IU]
  Filled 2013-10-31: qty 5

## 2013-10-31 MED ORDER — SODIUM CHLORIDE 0.9 % IJ SOLN
10.0000 mL | INTRAMUSCULAR | Status: DC | PRN
  Administered 2013-10-31: 10 mL
  Filled 2013-10-31: qty 10

## 2013-10-31 NOTE — Telephone Encounter (Signed)
Informed pt letter faxed to "[employer]" as requested.

## 2013-11-03 ENCOUNTER — Emergency Department (HOSPITAL_COMMUNITY): Payer: 59

## 2013-11-03 ENCOUNTER — Emergency Department (HOSPITAL_COMMUNITY)
Admission: EM | Admit: 2013-11-03 | Discharge: 2013-11-04 | Disposition: A | Discharge status: Home / Self Care | Payer: 59 | Attending: Emergency Medicine | Admitting: Emergency Medicine

## 2013-11-03 ENCOUNTER — Encounter (HOSPITAL_COMMUNITY): Payer: Self-pay | Admitting: Emergency Medicine

## 2013-11-03 ENCOUNTER — Other Ambulatory Visit: Payer: Self-pay | Admitting: Oncology

## 2013-11-03 ENCOUNTER — Emergency Department (HOSPITAL_COMMUNITY)
Admission: EM | Admit: 2013-11-03 | Discharge: 2013-11-03 | Disposition: A | Discharge status: Home / Self Care | Payer: 59 | Attending: Emergency Medicine | Admitting: Emergency Medicine

## 2013-11-03 DIAGNOSIS — Z923 Personal history of irradiation: Secondary | ICD-10-CM | POA: Insufficient documentation

## 2013-11-03 DIAGNOSIS — Z792 Long term (current) use of antibiotics: Secondary | ICD-10-CM | POA: Insufficient documentation

## 2013-11-03 DIAGNOSIS — N390 Urinary tract infection, site not specified: Secondary | ICD-10-CM | POA: Insufficient documentation

## 2013-11-03 DIAGNOSIS — M7989 Other specified soft tissue disorders: Secondary | ICD-10-CM | POA: Insufficient documentation

## 2013-11-03 DIAGNOSIS — R110 Nausea: Secondary | ICD-10-CM | POA: Insufficient documentation

## 2013-11-03 DIAGNOSIS — Z85048 Personal history of other malignant neoplasm of rectum, rectosigmoid junction, and anus: Secondary | ICD-10-CM | POA: Insufficient documentation

## 2013-11-03 DIAGNOSIS — R209 Unspecified disturbances of skin sensation: Secondary | ICD-10-CM | POA: Insufficient documentation

## 2013-11-03 DIAGNOSIS — Z85038 Personal history of other malignant neoplasm of large intestine: Secondary | ICD-10-CM | POA: Insufficient documentation

## 2013-11-03 DIAGNOSIS — M129 Arthropathy, unspecified: Secondary | ICD-10-CM | POA: Insufficient documentation

## 2013-11-03 DIAGNOSIS — Z8739 Personal history of other diseases of the musculoskeletal system and connective tissue: Secondary | ICD-10-CM | POA: Insufficient documentation

## 2013-11-03 DIAGNOSIS — R0989 Other specified symptoms and signs involving the circulatory and respiratory systems: Secondary | ICD-10-CM | POA: Insufficient documentation

## 2013-11-03 DIAGNOSIS — M79609 Pain in unspecified limb: Secondary | ICD-10-CM | POA: Insufficient documentation

## 2013-11-03 DIAGNOSIS — Z79899 Other long term (current) drug therapy: Secondary | ICD-10-CM | POA: Insufficient documentation

## 2013-11-03 DIAGNOSIS — Z932 Ileostomy status: Secondary | ICD-10-CM | POA: Insufficient documentation

## 2013-11-03 LAB — COMPREHENSIVE METABOLIC PANEL
ALBUMIN: 3.4 g/dL — AB (ref 3.5–5.2)
ALT: 38 U/L — ABNORMAL HIGH (ref 0–35)
AST: 27 U/L (ref 0–37)
Alkaline Phosphatase: 87 U/L (ref 39–117)
BUN: 13 mg/dL (ref 6–23)
CALCIUM: 8.9 mg/dL (ref 8.4–10.5)
CO2: 25 mEq/L (ref 19–32)
CREATININE: 0.75 mg/dL (ref 0.50–1.10)
Chloride: 102 mEq/L (ref 96–112)
GFR calc Af Amer: >90 mL/min (ref >90)
GFR calc non Af Amer: >90 mL/min (ref >90)
Glucose, Bld: 99 mg/dL (ref 70–99)
Potassium: 4.1 mEq/L (ref 3.7–5.3)
Sodium: 140 mEq/L (ref 137–147)
TOTAL PROTEIN: 6.4 g/dL (ref 6.0–8.3)
Total Bilirubin: 0.3 mg/dL (ref 0.3–1.2)

## 2013-11-03 LAB — CBC WITH DIFFERENTIAL/PLATELET
BASOS ABS: 0 10*3/uL (ref 0.0–0.1)
Basophils Relative: 0 % (ref 0–1)
EOS ABS: 0.3 10*3/uL (ref 0.0–0.7)
Eosinophils Relative: 2 % (ref 0–5)
HCT: 30.6 % — ABNORMAL LOW (ref 36.0–46.0)
Hemoglobin: 10.4 g/dL — ABNORMAL LOW (ref 12.0–15.0)
LYMPHS ABS: 1.1 10*3/uL (ref 0.7–4.0)
Lymphocytes Relative: 7 % — ABNORMAL LOW (ref 12–46)
MCH: 30.5 pg (ref 26.0–34.0)
MCHC: 34 g/dL (ref 30.0–36.0)
MCV: 89.7 fL (ref 78.0–100.0)
MONO ABS: 1.1 10*3/uL — AB (ref 0.1–1.0)
MONOS PCT: 7 % (ref 3–12)
Neutro Abs: 13.2 10*3/uL — ABNORMAL HIGH (ref 1.7–7.7)
Neutrophils Relative %: 84 % — ABNORMAL HIGH (ref 43–77)
Platelets: 160 10*3/uL (ref 150–400)
RBC: 3.41 MIL/uL — ABNORMAL LOW (ref 3.87–5.11)
RDW: 14.3 % (ref 11.5–15.5)
WBC: 15.7 10*3/uL — AB (ref 4.0–10.5)

## 2013-11-03 LAB — URINALYSIS, ROUTINE W REFLEX MICROSCOPIC
Bilirubin Urine: NEGATIVE
Glucose, UA: NEGATIVE mg/dL
Ketones, ur: NEGATIVE mg/dL
Nitrite: POSITIVE — AB
Protein, ur: 100 mg/dL — AB
SPECIFIC GRAVITY, URINE: 1.016 (ref 1.005–1.030)
UROBILINOGEN UA: 0.2 mg/dL (ref 0.0–1.0)
pH: 6 (ref 5.0–8.0)

## 2013-11-03 LAB — URINE MICROSCOPIC-ADD ON

## 2013-11-03 LAB — I-STAT CG4 LACTIC ACID, ED: Lactic Acid, Venous: 1.07 mmol/L (ref 0.5–2.2)

## 2013-11-03 MED ORDER — ENOXAPARIN SODIUM 80 MG/0.8ML ~~LOC~~ SOLN
65.0000 mg | Freq: Once | SUBCUTANEOUS | Status: AC
  Administered 2013-11-04: 65 mg via SUBCUTANEOUS
  Filled 2013-11-03: qty 0.8

## 2013-11-03 MED ORDER — ONDANSETRON HCL 4 MG/2ML IJ SOLN
4.0000 mg | Freq: Once | INTRAMUSCULAR | Status: AC
  Administered 2013-11-03: 4 mg via INTRAVENOUS
  Filled 2013-11-03: qty 2

## 2013-11-03 MED ORDER — CIPROFLOXACIN HCL 500 MG PO TABS: 500.0000 mg | ORAL_TABLET | Freq: Two times a day (BID) | ORAL | Status: DC

## 2013-11-03 MED ORDER — ONDANSETRON HCL 4 MG PO TABS: 4.0000 mg | ORAL_TABLET | Freq: Four times a day (QID) | ORAL | Status: DC

## 2013-11-03 MED ORDER — HEPARIN SOD (PORK) LOCK FLUSH 100 UNIT/ML IV SOLN
INTRAVENOUS | Status: AC
  Filled 2013-11-03: qty 5

## 2013-11-03 MED ORDER — HEPARIN SOD (PORK) LOCK FLUSH 100 UNIT/ML IV SOLN
500.0000 [IU] | Freq: Once | INTRAVENOUS | Status: AC
Start: 2013-11-03 — End: 2013-11-03
  Administered 2013-11-03: 500 [IU]

## 2013-11-03 MED ORDER — CEFTRIAXONE SODIUM 1 G IJ SOLR
1.0000 g | Freq: Once | INTRAMUSCULAR | Status: AC
Start: 2013-11-03 — End: 2013-11-03
  Administered 2013-11-03: 1 g via INTRAVENOUS
  Filled 2013-11-03: qty 10

## 2013-11-03 MED ORDER — SODIUM CHLORIDE 0.9 % IV BOLUS (SEPSIS)
1000.0000 mL | Freq: Once | INTRAVENOUS | Status: AC
  Administered 2013-11-03: 1000 mL via INTRAVENOUS

## 2013-11-03 NOTE — ED Provider Notes (Signed)
TIME SEEN: 7:05 AM  CHIEF COMPLAINT: Fever  HPI: Patient is a 53 y.o. F with history of rectal cancer status post radiation his last chemotherapy was on Thursday, 3 days ago, who presents emergency department with fever at home of 100.9 that started last night. She states she has had some lightheadedness and nausea but no other associated symptoms.  She denies any headache, neck pain or neck stiffness, sore throat or ear pain, cough, abdominal pain, vomiting or diarrhea, dysuria or hematuria. 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  ROS: See HPI Constitutional:  fever  Eyes: no drainage  ENT: no runny nose   Cardiovascular:  no chest pain  Resp: no SOB  GI: no vomiting GU: no dysuria Integumentary: no rash  Allergy: no hives  Musculoskeletal: no leg swelling  Neurological: no slurred speech ROS otherwise negative  PAST MEDICAL HISTORY/PAST SURGICAL HISTORY:  Past Medical History  Diagnosis Date  . History of radiation therapy 06/18/13-07/26/13    rectal 50.4Gy total dose  . IUD (intrauterine device) in place 08-27-13    Mirena Implant inplace  . Arthritis 08-27-13    at present has ruptured disc- lower back-not an issue now  . Colon cancer 08-27-13    radiation /chemo -last ended 4 weeks(Dr. Benay Spice)  . PONV (postoperative nausea and vomiting)     scopalamine patch helped    MEDICATIONS:  Prior to Admission medications   Medication Sig Start Date End Date Taking? Authorizing Provider  ALPRAZolam Duanne Moron) 0.5 MG tablet Take 1 tablet (0.5 mg total) by mouth at bedtime as needed. 10/29/13  Yes Ladell Pier, MD  estradiol (VIVELLE-DOT) 0.05 MG/24HR Place 1 patch onto the skin 2 (two) times a week. Sundays and Wednesday   Yes Historical Provider, MD  lidocaine-prilocaine (EMLA) cream  10/11/13  Yes Historical Provider, MD  ondansetron (ZOFRAN ODT) 8 MG disintegrating tablet Take 1 tablet (8 mg total)  by mouth every 8 (eight) hours as needed for nausea or vomiting. 10/17/13  Yes Owens Shark, NP  oxyCODONE-acetaminophen (PERCOCET/ROXICET) 5-325 MG per tablet Take 1-2 tablets by mouth every 6 (six) hours as needed for moderate pain or severe pain. 10/22/13  Yes Ladell Pier, MD  PRESCRIPTION MEDICATION Oxaliplatin/Fluorouracil,every other week at the [facility] under the care of Dr. Benay Spice   Yes Historical Provider, MD  prochlorperazine (COMPAZINE) 10 MG tablet Take 1 tablet (10 mg total) by mouth every 6 (six) hours as needed for nausea or vomiting. 10/22/13  Yes Ladell Pier, MD  progesterone (PROMETRIUM) 200 MG capsule Take 200 mg by mouth. Every 3 months 03/21/13  Yes Historical Provider, MD  sertraline (ZOLOFT) 50 MG tablet Take 50 mg by mouth every morning.  03/21/13  Yes Historical Provider, MD    ALLERGIES:  No Known Allergies  SOCIAL HISTORY:  History  Substance Use Topics  . Smoking status: Never Smoker   . Smokeless tobacco: Not on file  . Alcohol Use: Yes     Comment: weekends    FAMILY HISTORY: History reviewed. No pertinent family history.  EXAM: BP 101/61  Pulse 98  Temp(Src) 99 F (37.2 C) (Oral)  Resp 18  Ht 5\' 4"  (1.626 m)  Wt 140 lb (63.504 kg)  BMI 24.02 kg/m2  SpO2 94% CONSTITUTIONAL: Alert and oriented and responds appropriately to questions. Well-appearing; well-nourished, nontoxic, pleasant, in no apparent distress HEAD: Normocephalic EYES: Conjunctivae clear, PERRL ENT: normal nose;  no rhinorrhea; moist mucous membranes; pharynx without lesions noted NECK: Supple, no meningismus, no LAD  CARD: RRR; S1 and S2 appreciated; no murmurs, no clicks, no rubs, no gallops RESP: Normal chest excursion without splinting or tachypnea; breath sounds clear and equal bilaterally; no wheezes, no rhonchi, no rales,  ABD/GI: Normal bowel sounds; non-distended; soft, non-tender, no rebound, no guarding; ileostomy in right lower quadrant, vertical surgical incision in the  suprapubic region that is well-healed BACK:  The back appears normal and is non-tender to palpation, there is no CVA tenderness EXT: Normal ROM in all joints; non-tender to palpation; no edema; normal capillary refill; no cyanosis    SKIN: Normal color for age and race; warm NEURO: Moves all extremities equally PSYCH: [name] mood and manner are appropriate. Grooming and personal hygiene are appropriate.  MEDICAL DECISION MAKING: Patient here with fever his last chemotherapy was 3 days ago. Her oncologist is Dr. Benay Spice.  Her primary care physician as her OB/GYN. We'll obtain basic labs,  urinalysis and urine culture, blood cultures and chest x-ray. She is otherwise for well appearing, no apparent distress, hemodynamically stable, nontoxic. Anticipate consultation with her oncologist for disposition.  ED PROGRESS: Patient has a urinary tract infection. Her urine culture is pending. We'll give IV ceftriaxone. Her labs show leukocytosis of 15.7 with left shift. No neutropenia. Chest x-ray is clear. She is still hemodynamically stable, well-appearing, nontoxic. We'll discuss with her oncologist. Patient is still nauseous but has not vomited. She states she would like to be discharged home if possible on oral antibiotics.   9:07 AM  D/w Dr. Inda Merlin with oncology who agrees that this patient is not neutropenic that she is safe to be discharged home on oral antibiotics. Have discussed supportive care instructions and return precautions. Patient and family are comfortable with this plan.  [place], DO 11/03/13 [phone]

## 2013-11-03 NOTE — ED Notes (Signed)
Patient states that she was here earlier this morning for a UTI - the patient is a cancer patient and has a Port to her left chest. Since it has been deaccessed she has had swelling and tenderness to her left arm and tingling down into her hands.

## 2013-11-03 NOTE — ED Notes (Addendum)
Pt reports that she had a chemo treatment on Tuesday and Thursday, noted having a low grade temp on Friday night, then began having a temp of 100.7 at 0430 this morning.  Pt a&o x4, skin warm and dry, ambulatory to triage. States nausea is not above her normal level, denies pain and states she is lightheaded. Pt states she took x2 5-325mg  Oxycodone at 0500.

## 2013-11-03 NOTE — ED Notes (Signed)
Bed: XB28 Expected date:  Expected time:  Means of arrival:  Comments: Tr5

## 2013-11-03 NOTE — Discharge Instructions (Signed)
Urinary Tract Infection Urinary tract infections (UTIs) can develop anywhere along your urinary tract. Your urinary tract is your body's drainage system for removing wastes and extra water. Your urinary tract includes two kidneys, two ureters, a bladder, and a urethra. Your kidneys are a pair of bean-shaped organs. Each kidney is about the size of your fist. They are located below your ribs, one on each side of your spine. CAUSES Infections are caused by microbes, which are microscopic organisms, including fungi, viruses, and bacteria. These organisms are so small that they can only be seen through a microscope. Bacteria are the microbes that most commonly cause UTIs. SYMPTOMS  Symptoms of UTIs may vary by age and gender of the patient and by the location of the infection. Symptoms in young women typically include a frequent and intense urge to urinate and a painful, burning feeling in the bladder or urethra during urination. Older women and men are more likely to be tired, shaky, and weak and have muscle aches and abdominal pain. A fever may mean the infection is in your kidneys. Other symptoms of a kidney infection include pain in your back or sides below the ribs, nausea, and vomiting. DIAGNOSIS To diagnose a UTI, your caregiver will ask you about your symptoms. Your caregiver also will ask to provide a urine sample. The urine sample will be tested for bacteria and white blood cells. White blood cells are made by your body to help fight infection. TREATMENT  Typically, UTIs can be treated with medication. Because most UTIs are caused by a bacterial infection, they usually can be treated with the use of antibiotics. The choice of antibiotic and length of treatment depend on your symptoms and the type of bacteria causing your infection. HOME CARE INSTRUCTIONS  If you were prescribed antibiotics, take them exactly as your caregiver instructs you. Finish the medication even if you feel better after you  have only taken some of the medication.  Drink enough water and fluids to keep your urine clear or pale yellow.  Avoid caffeine, tea, and carbonated beverages. They tend to irritate your bladder.  Empty your bladder often. Avoid holding urine for long periods of time.  Empty your bladder before and after sexual intercourse.  After a bowel movement, women should cleanse from front to back. Use each tissue only once. SEEK MEDICAL CARE IF:   You have back pain.  You develop a fever.  Your symptoms do not begin to resolve within 3 days. SEEK IMMEDIATE MEDICAL CARE IF:   You have severe back pain or lower abdominal pain.  You develop chills.  You have nausea or vomiting.  You have continued burning or discomfort with urination. MAKE SURE YOU:   Understand these instructions.  Will watch your condition.  Will get help right away if you are not doing well or get worse. Document Released: 03/16/2005 Document Revised: 12/06/2011 Document Reviewed: 07/15/2011 [facility] Patient Information 2014 [place]. Fever, Adult A fever is a higher than normal body temperature. In an adult, an oral temperature around 98.6 F (37 C) is considered normal. A temperature of 100.4 F (38 C) or higher is generally considered a fever. Mild or moderate fevers generally have no long-term effects and often do not require treatment. Extreme fever (greater than or equal to 106 F or 41.1 C) can cause seizures. The sweating that may occur with repeated or prolonged fever may cause dehydration. Elderly people can develop confusion during a fever. A measured temperature can vary with:  Age.  Time of day.  Method of measurement (mouth, underarm, rectal, or ear). The fever is confirmed by taking a temperature with a thermometer. Temperatures can be taken different ways. Some methods are accurate and some are not.  An oral temperature is used most commonly. Electronic thermometers are fast and  accurate.  An ear temperature will only be accurate if the thermometer is positioned as recommended by the manufacturer.  A rectal temperature is accurate and done for those adults who have a condition where an oral temperature cannot be taken.  An underarm (axillary) temperature is not accurate and not recommended. Fever is a symptom, not a disease.  CAUSES   Infections commonly cause fever.  Some noninfectious causes for fever include:  Some arthritis conditions.  Some thyroid or adrenal gland conditions.  Some immune system conditions.  Some types of cancer.  A medicine reaction.  High doses of certain street drugs such as methamphetamine.  Dehydration.  Exposure to high outside or room temperatures.  Occasionally, the source of a fever cannot be determined. This is sometimes called a "fever of unknown origin" (FUO).  Some situations may lead to a temporary rise in body temperature that may go away on its own. Examples are:  Childbirth.  Surgery.  Intense exercise. HOME CARE INSTRUCTIONS   Take appropriate medicines for fever. Follow dosing instructions carefully. If you use acetaminophen to reduce the fever, be careful to avoid taking other medicines that also contain acetaminophen. Do not take aspirin for a fever if you are younger than age 41. There is an association with Reye's syndrome. Reye's syndrome is a rare but potentially deadly disease.  If an infection is present and antibiotics have been prescribed, take them as directed. Finish them even if you start to feel better.  Rest as needed.  Maintain an adequate fluid intake. To prevent dehydration during an illness with prolonged or recurrent fever, you may need to drink extra fluid.Drink enough fluids to keep your urine clear or pale yellow.  Sponging or bathing with room temperature water may help reduce body temperature. Do not use ice water or alcohol sponge baths.  Dress comfortably, but do not  over-bundle. SEEK MEDICAL CARE IF:   You are unable to keep fluids down.  You develop vomiting or diarrhea.  You are not feeling at least partly better after 3 days.  You develop new symptoms or problems. SEEK IMMEDIATE MEDICAL CARE IF:   You have shortness of breath or trouble breathing.  You develop excessive weakness.  You are dizzy or you faint.  You are extremely thirsty or you are making little or no urine.  You develop new pain that was not there before (such as in the head, neck, chest, back, or abdomen).  You have persistant vomiting and diarrhea for more than 1 to 2 days.  You develop a stiff neck or your eyes become sensitive to light.  You develop a skin rash.  You have a fever or persistent symptoms for more than 2 to 3 days.  You have a fever and your symptoms suddenly get worse. MAKE SURE YOU:   Understand these instructions.  Will watch your condition.  Will get help right away if you are not doing well or get worse. Document Released: 11/30/2000 Document Revised: 08/29/2011 Document Reviewed: 04/07/2011 [facility] Patient Information 2014 [place], [place].

## 2013-11-04 ENCOUNTER — Ambulatory Visit (HOSPITAL_COMMUNITY)
Admission: RE | Admit: 2013-11-04 | Discharge: 2013-11-04 | Disposition: A | Discharge status: Home / Self Care | Payer: 59 | Source: Ambulatory Visit | Attending: Emergency Medicine | Admitting: Emergency Medicine

## 2013-11-04 DIAGNOSIS — M7989 Other specified soft tissue disorders: Secondary | ICD-10-CM | POA: Insufficient documentation

## 2013-11-04 DIAGNOSIS — M79609 Pain in unspecified limb: Secondary | ICD-10-CM

## 2013-11-04 NOTE — Discharge Instructions (Signed)
You were evaluated and need further imaging for DVT.  You were treated and need to return tomorrow for ultrasound imaging.    Peripheral Edema You have swelling in your legs (peripheral edema). This swelling is due to excess accumulation of salt and water in your body. Edema may be a sign of heart, kidney or liver disease, or a side effect of a medication. It may also be due to problems in the leg veins. Elevating your legs and using special support stockings may be very helpful, if the cause of the swelling is due to poor venous circulation. Avoid long periods of standing, whatever the cause. Treatment of edema depends on identifying the cause. Chips, pretzels, pickles and other salty foods should be avoided. Restricting salt in your diet is almost always needed. Water pills (diuretics) are often used to remove the excess salt and water from your body via urine. These medicines prevent the kidney from reabsorbing sodium. This increases urine flow. Diuretic treatment may also result in lowering of potassium levels in your body. Potassium supplements may be needed if you have to use diuretics daily. Daily weights can help you keep track of your progress in clearing your edema. You should call your caregiver for follow up care as recommended. SEEK IMMEDIATE MEDICAL CARE IF:   You have increased swelling, pain, redness, or heat in your legs.  You develop shortness of breath, especially when lying down.  You develop chest or abdominal pain, weakness, or fainting.  You have a fever. Document Released: 07/14/2004 Document Revised: 08/29/2011 Document Reviewed: 06/24/2009 [facility] Patient Information 2014 [place].

## 2013-11-04 NOTE — ED Provider Notes (Signed)
CSN: [id number]     Arrival date & time 11/03/13  2205 History   First MD Initiated Contact with Patient 11/03/13 2316     Chief Complaint  Patient presents with  . Arm Swelling     (Consider location/radiation/quality/duration/timing/severity/associated sxs/prior Treatment) HPI  This a 53 year old female with history of rectal cancer who presents with left arm swelling. She was seen and evaluated this morning and found to have a urinary tract infection. At that time her port was accessed. She states that today she has noted increasing pain, swelling, and paresthesias of the left upper extremity. No history of DVT clots. No known injury.  She called her oncologist and they referred her here for further evaluation. She otherwise states that she is feeling well.  Past Medical History  Diagnosis Date  . History of radiation therapy 06/18/13-07/26/13    rectal 50.4Gy total dose  . IUD (intrauterine device) in place 08-27-13    Mirena Implant inplace  . Arthritis 08-27-13    at present has ruptured disc- lower back-not an issue now  . Colon cancer 08-27-13    radiation /chemo -last ended 4 weeks(Dr. Benay Spice)  . PONV (postoperative nausea and vomiting)     scopalamine patch helped   Past Surgical History  Procedure Laterality Date  . Back surgery    . Cesarean section      x2  . Cervical disc arthroplasty  01/03/2012    Procedure: CERVICAL ANTERIOR DISC ARTHROPLASTY;  Surgeon: Charlie Pitter, MD;  Location: [facility];  Service: Neurosurgery;  Laterality: N/A;  Cervical Anterior Disc Arthorplasty Five-Six  . Eus N/A 05/31/2013    Procedure: LOWER ENDOSCOPIC ULTRASOUND (EUS);  Surgeon: Beryle Beams, MD;  Location: [facility];  Service: Endoscopy;  Laterality: N/A;  . Intrauterine device insertion  2006    minera  . Knee arthroscopy Left 08-27-13    torn meniscus repair  . Breast surgery      reduction surgery  . Refractive surgery    . Laparoscopic low anterior resection N/A  09/02/2013    Procedure: LAPAROSCOPIC LOW ANTERIOR RESECTION;  Surgeon: [name], MD;  Location: [facility];  Service: General;  Laterality: N/A;  . Laparotomy N/A 09/13/2013    Procedure: EXPLORATORY LAPAROTOMY / FLEXIBLE SIGMOIDOSCOPY/  DRAINAGE OF PELVIC ABSCESS WITH  DIVERTING  LOOP  ILEOSTOMY;  Surgeon: [name], MD;  Location: [facility];  Service: General;  Laterality: N/A;  . Portacath placement Left 10/11/2013    Procedure: INSERTION PORT-A-CATH;  Surgeon: [name], MD;  Location: [facility];  Service: General;  Laterality: Left;   History reviewed. No pertinent family history. History  Substance Use Topics  . Smoking status: Never Smoker   . Smokeless tobacco: Not on file  . Alcohol Use: Yes     Comment: weekends   OB History   Grav Para Term Preterm Abortions TAB SAB Ect Mult Living                 Review of Systems  Constitutional: Negative for fever.  Respiratory: Negative for chest tightness and shortness of breath.   Cardiovascular: Negative for chest pain.  Gastrointestinal: Negative for nausea, vomiting and abdominal pain.  Genitourinary: Negative for dysuria.  Musculoskeletal: Negative for back pain.       Left arm swelling  Skin: Negative for wound.  All other systems reviewed and are negative.     Allergies  Review of patient's allergies indicates no known allergies.  Home  Medications   Prior to Admission medications   Medication Sig Start Date End Date Taking? Authorizing Provider  ALPRAZolam Duanne Moron) 0.5 MG tablet Take 1 tablet (0.5 mg total) by mouth at bedtime as needed. 10/29/13  Yes Ladell Pier, MD  estradiol (VIVELLE-DOT) 0.05 MG/24HR Place 1 patch onto the skin 2 (two) times a week. Sundays and Wednesday   Yes Historical Provider, MD  lidocaine-prilocaine (EMLA) cream  10/11/13  Yes Historical Provider, MD  ondansetron (ZOFRAN ODT) 8 MG disintegrating tablet Take 1 tablet (8 mg total) by mouth every 8 (eight) hours as needed for nausea or  vomiting. 10/17/13  Yes Owens Shark, NP  oxyCODONE-acetaminophen (PERCOCET/ROXICET) 5-325 MG per tablet Take 1-2 tablets by mouth every 6 (six) hours as needed for moderate pain or severe pain. 10/22/13  Yes Ladell Pier, MD  PRESCRIPTION MEDICATION Oxaliplatin/Fluorouracil,every other week at the [facility] under the care of Dr. Benay Spice   Yes Historical Provider, MD  prochlorperazine (COMPAZINE) 10 MG tablet Take 1 tablet (10 mg total) by mouth every 6 (six) hours as needed for nausea or vomiting. 10/22/13  Yes Ladell Pier, MD  progesterone (PROMETRIUM) 200 MG capsule Take 200 mg by mouth. Every 3 months 03/21/13  Yes Historical Provider, MD  sertraline (ZOLOFT) 50 MG tablet Take 50 mg by mouth every morning.  03/21/13  Yes Historical Provider, MD  ciprofloxacin (CIPRO) 500 MG tablet Take 1 tablet (500 mg total) by mouth 2 (two) times daily. 11/03/13   Kristen N Ward, DO  ondansetron (ZOFRAN) 4 MG tablet Take 1 tablet (4 mg total) by mouth every 6 (six) hours. 11/03/13   Kristen N Ward, DO   BP 102/66  Pulse 92  Temp(Src) 99.6 F (37.6 C) (Oral)  Resp 18  SpO2 97% Physical Exam  Nursing note and vitals reviewed. Constitutional: She is oriented to person, place, and time. She appears well-developed and well-nourished.  HENT:  Head: Normocephalic and atraumatic.  Cardiovascular: Normal rate, regular rhythm, normal heart sounds and intact distal pulses.   Pulmonary/Chest: Effort normal and breath sounds normal. No respiratory distress. She has no wheezes.  Port in left upper chest  Musculoskeletal:  Mild swelling noted diffusely over the left upper extremity, superficial venous engorgement noted, no redness or overlying skin changes  Neurological: She is alert and oriented to person, place, and time.  Skin: Skin is warm and dry.  Psychiatric: She has a normal mood and affect.    ED Course  Procedures (including critical care time) Labs Review Labs Reviewed - No data to display  Imaging  Review Dg Chest 2 View  11/03/2013   CLINICAL DATA:  ARM SWELLING  EXAM: CHEST  2 VIEW  COMPARISON:  DG CHEST 2 VIEW dated 11/03/2013; DG CHEST 1V PORT dated 10/11/2013; CT CHEST W/O CM dated 06/11/2013  FINDINGS: The heart size and mediastinal contours are within normal limits. Linear areas of scarring versus atelectasis within the lung bases. A left porta catheter with tip projecting in the region of the superior vena cava right atrial junction. Both lungs are otherwise clear. The visualized skeletal structures are unremarkable.  IMPRESSION: No active cardiopulmonary disease. Scarring versus atelectasis within the lung bases.   Electronically Signed   By: Margaree Mackintosh M.D.   On: 11/03/2013 23:03   Dg Chest 2 View  11/03/2013   CLINICAL DATA:  Fever; rectal carcinoma  EXAM: CHEST  2 VIEW  COMPARISON:  October 11, 2013  FINDINGS: Port-A-Cath tip is at cavoatrial junction.  No pneumothorax. There is scarring in the left base medially, stable. Elsewhere lungs are clear. Heart size and pulmonary vascularity are normal. No adenopathy. There is degenerative change in the thoracic spine.  IMPRESSION: Scarring left base.  No edema or consolidation.   Electronically Signed   By: Lowella Grip M.D.   On: 11/03/2013 07:35     EKG Interpretation None      MDM   Final diagnoses:  Left arm swelling    Patient presents with left upper extremity swelling. She is otherwise nontoxic. She denies chest pain or shortness of breath. She does have moderate swelling when compared to the right upper extremity. No overlying skin changes or evidence of cellulitis. Concern for DVT. Given patient has active cancer, suspect d-dimer would be positive. Screening lab work and chest x-ray obtained this morning and reviewed. Lovenox ordered. Patient will followup tomorrow morning for ultrasound. Patient and husband were informed of plan.  After history, exam, and medical workup I feel the patient has been appropriately  medically screened and is safe for discharge home. Pertinent diagnoses were discussed with the patient. Patient was given return precautions.     Merryl Hacker, MD 11/04/13 [phone]

## 2013-11-05 ENCOUNTER — Telehealth (INDEPENDENT_AMBULATORY_CARE_PROVIDER_SITE_OTHER): Payer: Self-pay

## 2013-11-05 LAB — URINE CULTURE

## 2013-11-05 NOTE — Telephone Encounter (Signed)
Patient called to inform DR. [name] she was in the ED x2 on Sunday 11-02-13 , in the am for UTI, 2nd time left arm pain with negative doppler study , advised to monitor her left arm swelling and   report any changes . Appointment  11-06-13 830am with DR. [employer]

## 2013-11-06 ENCOUNTER — Ambulatory Visit (INDEPENDENT_AMBULATORY_CARE_PROVIDER_SITE_OTHER): Payer: 59 | Admitting: Surgery

## 2013-11-06 VITALS — BP 118/62 | HR 62 | Temp 98.0°F | Resp 18

## 2013-11-06 DIAGNOSIS — C20 Malignant neoplasm of rectum: Secondary | ICD-10-CM

## 2013-11-06 NOTE — Progress Notes (Signed)
[place], MD,  [address]  [place], [place]    [place] Phone:  [phone] FAX:  [phone]   Re:   [name] DOB:   20-Jul-1960 MRN:   [id number]  ASSESSMENT AND PLAN: 1. Laparoscopic-assisted low anterior colon resection with #29 EEA anastomosis. Appedectomy - 09/02/2013 - D. Danaiya Steadman   Exploratory lap, drainage of pelvic abscess, diverting loop ileostomy, flexible sigmoidoscopy - for leak - 09/13/2013 - D. [name]   For oncology, sees Dr. Essie Christine.  She had neoadjuvant chemo/radiation therapy.   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 Margins clear.   Ileostomy functioning.    I gave her Percocet (5/325), #50   Still having issues with perineal numbness, pelvic pain, improving urinary function.  New issues is left arm swelling.  Has had 2 of 9 cycles of chemotx.  She is getting FOLFOX.  I will see her back in 2 to 3 months.   2.  Power port - 10/11/2013 - D. Jordanny Waddington  2A.  Left arm swelling - went to ER on 11/04/2013 - neg doppler  I started her on aspirin - 300mg /day  To see Dr. Benay Spice next week - if still swollen consider redoppler or treat with lovenox.  I spoke to Dr. Benay Spice on the phone.  She is to call if this gets worse.  3.  DJD   Has seen Dr. Sherrian Divers  4. Anxiety  5. IUD - stuck   Sees Dr. Susette Racer  6. Indeterminate 6 mm liver lesion seen on CT scan  7. History of HELLP syndrome with first pregnancy  8. According to her husband, she does hear well without her hearing aids.  9. Urinary incontinence/retention   Dr. Chauncey Cruel. MacDiarmid's saw her 09/30/2013.   She is being treated for a UTI with Cipro.  This is from the ER.  Urine culture grew >100,000 E. Coli   10. Perineal numbness. Remote history of back surgery.   Dr. Sherrian Divers has seen.   By may exam the numbness is primarily on the left, goes down the left posterior thigh about 6 inches and left posterior area  up from the rectum about 3 inches.    She says that she thinks this is better.  HISTORY OF PRESENT ILLNESS: Chief Complaint  Patient presents with  . Routine Post Op    colon ca   [name] is a 53 y.o. (DOB: June 10, 1961)  white  female who is a patient of NEAL,W RONALD, MD and comes to me today for follow up of LAR and rectal leak. She is accompanied with her husband.  She is doing better in some ways.  She is having more control of her bladder.  But she had a recent UTI and I encouraged her to talk to Dr. McDiarmid's office about when to cath herself, what residual urinary volume is "safe". She still has the pelvic pain, particularly at night, when she needs pain meds.  She has the numbness in her perineum, but she thinks that this is getting better. The new problem, though, is swelling of her left arm.  She had a doppler which was negative.  I spoke to Dr. Benay Spice.  We agreed on a plan of starting aspirin and Dr. Benay Spice is seeing her next week.  If she still has the swelling - repeat the dopplers and consider anticoagulant therapy.  Dr. Benay Spice said he would probably  use Lovenox because of concerns about interactions with other drugs.  Past Medical History  Diagnosis Date  . History of radiation therapy 06/18/13-07/26/13    rectal 50.4Gy total dose  . IUD (intrauterine device) in place 08-27-13    Mirena Implant inplace  . Arthritis 08-27-13    at present has ruptured disc- lower back-not an issue now  . Colon cancer 08-27-13    radiation /chemo -last ended 4 weeks(Dr. Benay Spice)  . PONV (postoperative nausea and vomiting)     scopalamine patch helped   Current Outpatient Prescriptions  Medication Sig Dispense Refill  . ALPRAZolam (XANAX) 0.5 MG tablet Take 1 tablet (0.5 mg total) by mouth at bedtime as needed.  30 tablet  1  . ciprofloxacin (CIPRO) 500 MG tablet Take 1 tablet (500 mg total) by mouth 2 (two) times daily.  14 tablet  0  . estradiol (VIVELLE-DOT) 0.05 MG/24HR  Place 1 patch onto the skin 2 (two) times a week. Sundays and Wednesday      . lidocaine-prilocaine (EMLA) cream       . ondansetron (ZOFRAN ODT) 8 MG disintegrating tablet Take 1 tablet (8 mg total) by mouth every 8 (eight) hours as needed for nausea or vomiting.  20 tablet  1  . ondansetron (ZOFRAN) 4 MG tablet Take 1 tablet (4 mg total) by mouth every 6 (six) hours.  20 tablet  0  . oxyCODONE-acetaminophen (PERCOCET/ROXICET) 5-325 MG per tablet Take 1-2 tablets by mouth every 6 (six) hours as needed for moderate pain or severe pain.  75 tablet  0  . PRESCRIPTION MEDICATION Oxaliplatin/Fluorouracil,every other week at the [facility] under the care of Dr. Benay Spice      . prochlorperazine (COMPAZINE) 10 MG tablet Take 1 tablet (10 mg total) by mouth every 6 (six) hours as needed for nausea or vomiting.  30 tablet  1  . progesterone (PROMETRIUM) 200 MG capsule Take 200 mg by mouth. Every 3 months      . sertraline (ZOLOFT) 50 MG tablet Take 50 mg by mouth every morning.        No current facility-administered medications for this visit.   SOCIAL HISTORY: Married. Her son has some graduate work in [place].  PHYSICAL EXAM: BP 118/62  Pulse 62  Temp(Src) 98 F (36.7 C)  Resp 18  General: WN WF who is alert. HEENT: Normal. Pupils equal.  Neck: Supple. No mass.  No thyroid mass.   Lungs: Clear to auscultation and symmetric breath sounds. Heart:  RRR. No murmur or rub. Abdomen: Soft. No mass. RLQ ileostomy.  Lower midline incision is now healed. Rectal: I did not examine her rectum today. Extremities:  She has mild swelling of the left arm with ruddiness and prominent veins.  There is no evidence of infection.  The left subclavian port site looks okay.  Her left radial pulse is okay.  Her right arm is normal.  DATA REVIEWED: Epic notes.  [name], MD,  [facility], [address]  [place], [place]    [place] Phone:  [phone] FAX:   [phone]

## 2013-11-07 ENCOUNTER — Telehealth (HOSPITAL_BASED_OUTPATIENT_CLINIC_OR_DEPARTMENT_OTHER): Payer: Self-pay | Admitting: Emergency Medicine

## 2013-11-07 NOTE — Telephone Encounter (Signed)
Post ED Visit - Positive Culture Follow-up  Culture report reviewed by antimicrobial stewardship pharmacist: [x]  Wes Edith Endave, Pharm.D., BCPS []  Heide Guile, Pharm.D., BCPS []  Alycia Rossetti, Pharm.D., BCPS []  [place], Pharm.D., BCPS, AAHIVP []  Legrand Como, Pharm.D., BCPS, AAHIVP []  Juliene Pina, Pharm.D.  Positive urine culture Treated with Cipro, organism sensitive to the same and no further patient follow-up is required at this time.  Kamri Gotsch 11/07/2013, 10:25 AM

## 2013-11-08 LAB — CULTURE, BLOOD (ROUTINE X 2)
CULTURE: NO GROWTH
Culture: NO GROWTH

## 2013-11-11 ENCOUNTER — Other Ambulatory Visit: Payer: Self-pay | Admitting: Oncology

## 2013-11-14 ENCOUNTER — Ambulatory Visit (HOSPITAL_BASED_OUTPATIENT_CLINIC_OR_DEPARTMENT_OTHER): Payer: 59

## 2013-11-14 ENCOUNTER — Ambulatory Visit: Payer: 59

## 2013-11-14 ENCOUNTER — Ambulatory Visit (HOSPITAL_BASED_OUTPATIENT_CLINIC_OR_DEPARTMENT_OTHER): Payer: 59 | Admitting: Oncology

## 2013-11-14 ENCOUNTER — Telehealth: Payer: Self-pay | Admitting: *Deleted

## 2013-11-14 ENCOUNTER — Other Ambulatory Visit (HOSPITAL_BASED_OUTPATIENT_CLINIC_OR_DEPARTMENT_OTHER): Payer: 59

## 2013-11-14 ENCOUNTER — Telehealth: Payer: Self-pay | Admitting: Oncology

## 2013-11-14 ENCOUNTER — Ambulatory Visit (HOSPITAL_COMMUNITY)
Admission: RE | Admit: 2013-11-14 | Discharge: 2013-11-14 | Disposition: A | Discharge status: Home / Self Care | Payer: 59 | Source: Ambulatory Visit | Attending: Oncology | Admitting: Oncology

## 2013-11-14 ENCOUNTER — Other Ambulatory Visit: Payer: Self-pay | Admitting: *Deleted

## 2013-11-14 VITALS — BP 121/75 | HR 77 | Temp 97.6°F | Resp 18 | Ht 64.0 in | Wt 142.2 lb

## 2013-11-14 DIAGNOSIS — M79609 Pain in unspecified limb: Secondary | ICD-10-CM

## 2013-11-14 DIAGNOSIS — D702 Other drug-induced agranulocytosis: Secondary | ICD-10-CM

## 2013-11-14 DIAGNOSIS — C20 Malignant neoplasm of rectum: Secondary | ICD-10-CM

## 2013-11-14 DIAGNOSIS — K7689 Other specified diseases of liver: Secondary | ICD-10-CM

## 2013-11-14 DIAGNOSIS — I82409 Acute embolism and thrombosis of unspecified deep veins of unspecified lower extremity: Secondary | ICD-10-CM

## 2013-11-14 DIAGNOSIS — R109 Unspecified abdominal pain: Secondary | ICD-10-CM

## 2013-11-14 DIAGNOSIS — R110 Nausea: Secondary | ICD-10-CM

## 2013-11-14 DIAGNOSIS — Z5111 Encounter for antineoplastic chemotherapy: Secondary | ICD-10-CM

## 2013-11-14 DIAGNOSIS — I82B19 Acute embolism and thrombosis of unspecified subclavian vein: Secondary | ICD-10-CM | POA: Insufficient documentation

## 2013-11-14 DIAGNOSIS — K6289 Other specified diseases of anus and rectum: Secondary | ICD-10-CM

## 2013-11-14 DIAGNOSIS — I808 Phlebitis and thrombophlebitis of other sites: Secondary | ICD-10-CM

## 2013-11-14 LAB — COMPREHENSIVE METABOLIC PANEL (CC13)
ALT: 51 U/L (ref 0–55)
AST: 35 U/L — AB (ref 5–34)
Albumin: 3.6 g/dL (ref 3.5–5.0)
Alkaline Phosphatase: 97 U/L (ref 40–150)
Anion Gap: 12 mEq/L — ABNORMAL HIGH (ref 3–11)
BILIRUBIN TOTAL: 0.31 mg/dL (ref 0.20–1.20)
BUN: 11.2 mg/dL (ref 7.0–26.0)
CO2: 19 mEq/L — ABNORMAL LOW (ref 22–29)
CREATININE: 0.8 mg/dL (ref 0.6–1.1)
Calcium: 9.5 mg/dL (ref 8.4–10.4)
Chloride: 107 mEq/L (ref 98–109)
Glucose: 85 mg/dl (ref 70–140)
Potassium: 3.9 mEq/L (ref 3.5–5.1)
Sodium: 138 mEq/L (ref 136–145)
Total Protein: 6.8 g/dL (ref 6.4–8.3)

## 2013-11-14 LAB — CBC WITH DIFFERENTIAL/PLATELET
BASO%: 0.6 % (ref 0.0–2.0)
BASOS ABS: 0 10*3/uL (ref 0.0–0.1)
EOS ABS: 0.3 10*3/uL (ref 0.0–0.5)
EOS%: 5.8 % (ref 0.0–7.0)
HCT: 36.6 % (ref 34.8–46.6)
HEMOGLOBIN: 12.2 g/dL (ref 11.6–15.9)
LYMPH%: 14.9 % (ref 14.0–49.7)
MCH: 30.3 pg (ref 25.1–34.0)
MCHC: 33.3 g/dL (ref 31.5–36.0)
MCV: 91.1 fL (ref 79.5–101.0)
MONO#: 0.4 10*3/uL (ref 0.1–0.9)
MONO%: 7.4 % (ref 0.0–14.0)
NEUT%: 71.3 % (ref 38.4–76.8)
NEUTROS ABS: 4.3 10*3/uL (ref 1.5–6.5)
Platelets: 132 10*3/uL — ABNORMAL LOW (ref 145–400)
RBC: 4.02 10*6/uL (ref 3.70–5.45)
RDW: 15.9 % — ABNORMAL HIGH (ref 11.2–14.5)
WBC: 6 10*3/uL (ref 3.9–10.3)
lymph#: 0.9 10*3/uL (ref 0.9–3.3)

## 2013-11-14 MED ORDER — DEXTROSE 5 % IV SOLN
Freq: Once | INTRAVENOUS | Status: AC
  Administered 2013-11-14: 11:00:00 via INTRAVENOUS

## 2013-11-14 MED ORDER — FLUOROURACIL CHEMO INJECTION 2.5 GM/50ML
400.0000 mg/m2 | Freq: Once | INTRAVENOUS | Status: AC
  Administered 2013-11-14: 700 mg via INTRAVENOUS
  Filled 2013-11-14: qty 14

## 2013-11-14 MED ORDER — LEUCOVORIN CALCIUM INJECTION 350 MG
700.0000 mg | Freq: Once | INTRAVENOUS | Status: AC
  Administered 2013-11-14: 700 mg via INTRAVENOUS
  Filled 2013-11-14: qty 35

## 2013-11-14 MED ORDER — DEXAMETHASONE SODIUM PHOSPHATE 10 MG/ML IJ SOLN
10.0000 mg | Freq: Once | INTRAMUSCULAR | Status: AC
  Administered 2013-11-14: 10 mg via INTRAVENOUS

## 2013-11-14 MED ORDER — PROCHLORPERAZINE MALEATE 10 MG PO TABS: 10.0000 mg | ORAL_TABLET | Freq: Four times a day (QID) | ORAL | Status: DC | PRN

## 2013-11-14 MED ORDER — ENOXAPARIN SODIUM 100 MG/ML ~~LOC~~ SOLN: 100.0000 mg | SUBCUTANEOUS | Status: DC

## 2013-11-14 MED ORDER — PALONOSETRON HCL INJECTION 0.25 MG/5ML
INTRAVENOUS | Status: AC
  Filled 2013-11-14: qty 5

## 2013-11-14 MED ORDER — PALONOSETRON HCL INJECTION 0.25 MG/5ML
0.2500 mg | Freq: Once | INTRAVENOUS | Status: AC
  Administered 2013-11-14: 0.25 mg via INTRAVENOUS

## 2013-11-14 MED ORDER — ENOXAPARIN SODIUM 100 MG/ML ~~LOC~~ SOLN
100.0000 mg | Freq: Once | SUBCUTANEOUS | Status: AC
  Administered 2013-11-14: 100 mg via SUBCUTANEOUS
  Filled 2013-11-14: qty 1

## 2013-11-14 MED ORDER — SODIUM CHLORIDE 0.9 % IV SOLN
150.0000 mg | Freq: Once | INTRAVENOUS | Status: AC
  Administered 2013-11-14: 150 mg via INTRAVENOUS
  Filled 2013-11-14: qty 5

## 2013-11-14 MED ORDER — OXYCODONE-ACETAMINOPHEN 5-325 MG PO TABS: 1.0000 | ORAL_TABLET | Freq: Four times a day (QID) | ORAL | Status: DC | PRN

## 2013-11-14 MED ORDER — SODIUM CHLORIDE 0.9 % IV SOLN
2400.0000 mg/m2 | INTRAVENOUS | Status: DC
  Administered 2013-11-14: 4050 mg via INTRAVENOUS
  Filled 2013-11-14: qty 81

## 2013-11-14 MED ORDER — DEXTROSE 5 % IV SOLN
85.0000 mg/m2 | Freq: Once | INTRAVENOUS | Status: AC
  Administered 2013-11-14: 145 mg via INTRAVENOUS
  Filled 2013-11-14: qty 29

## 2013-11-14 MED ORDER — DEXAMETHASONE SODIUM PHOSPHATE 10 MG/ML IJ SOLN
INTRAMUSCULAR | Status: AC
  Filled 2013-11-14: qty 1

## 2013-11-14 NOTE — Patient Instructions (Signed)
[facility] Discharge Instructions for Patients Receiving Chemotherapy  Today you received the following chemotherapy agents 5FU, Oxaliplatin and Leucovorin.   To help prevent nausea and vomiting after your treatment, we encourage you to take your nausea medication.   If you develop nausea and vomiting that is not controlled by your nausea medication, call the clinic.   BELOW ARE SYMPTOMS THAT SHOULD BE REPORTED IMMEDIATELY:  *FEVER GREATER THAN 100.5 F  *CHILLS WITH OR WITHOUT FEVER  NAUSEA AND VOMITING THAT IS NOT CONTROLLED WITH YOUR NAUSEA MEDICATION  *UNUSUAL SHORTNESS OF BREATH  *UNUSUAL BRUISING OR BLEEDING  TENDERNESS IN MOUTH AND THROAT WITH OR WITHOUT PRESENCE OF ULCERS  *URINARY PROBLEMS  *BOWEL PROBLEMS  UNUSUAL RASH Items with * indicate a potential emergency and should be followed up as soon as possible.  Feel free to call the clinic you have any questions or concerns. The clinic phone number is [phone].

## 2013-11-14 NOTE — Progress Notes (Signed)
Explained DVT and Lovenox with patient and husband. Explained side effects, how to administer, give at same time each day and when to call office using TEACH back method.  Husband was able to administer the injection with minimal verbal prompting except for location of injection, how to get air bubble in proper position, safety needle after injection. Technique was good; instructed him to push drug more slowly with next injection-give over about 30 seconds due to burning of medication. Patient feels she will be able to self-administer the Lovenox after watching her husband. Made them aware that script was called to [facility] earlier.

## 2013-11-14 NOTE — Progress Notes (Signed)
VASCULAR LAB PRELIMINARY  PRELIMINARY  PRELIMINARY  PRELIMINARY  Right upper extremity venous duplex completed.    Preliminary report:  Left:  DVT noted in the subclavian vein, axillary vein, and proximal brachial vein.  Superficial thrombosis in the basilic vein.   Nani Ravens, RVT 11/14/2013, 3:32 PM

## 2013-11-14 NOTE — Telephone Encounter (Signed)
, °

## 2013-11-14 NOTE — Patient Instructions (Signed)
Deep Vein Thrombosis A deep vein thrombosis (DVT) is a blood clot that develops in the deep, larger veins of the leg, arm, or pelvis. These are more dangerous than clots that might form in veins near the surface of the body. A DVT can lead to complications if the clot breaks off and travels in the bloodstream to the lungs.  A DVT can damage the valves in your leg veins, so that instead of flowing upward, the blood pools in the lower leg. This is called post-thrombotic syndrome, and it can result in pain, swelling, discoloration, and sores on the leg. CAUSES Usually, several things contribute to blood clots forming. Contributing factors include:  The flow of blood slows down.  The inside of the vein is damaged in some way.  You have a condition that makes blood clot more easily. RISK FACTORS Some people are more likely than others to develop blood clots. Risk factors include:   Older age, especially over 75 years of age.  Having a family history of blood clots or if you have already had a blot clot.  Having major or lengthy surgery. This is especially true for surgery on the hip, knee, or belly (abdomen). Hip surgery is particularly high risk.  Breaking a hip or leg.  Sitting or lying still for a long time. This includes long-distance travel, paralysis, or recovery from an illness or surgery.  Having cancer or cancer treatment.  Having a long, thin tube (catheter) placed inside a vein during a medical procedure.  Being overweight (obese).  Pregnancy and childbirth.  Hormone changes make the blood clot more easily during pregnancy.  The fetus puts pressure on the veins of the pelvis.  There is a risk of injury to veins during delivery or a caesarean. The risk is highest just after childbirth.  Medicines with the female hormone estrogen. This includes birth control pills and hormone replacement therapy.  Smoking.  Other circulation or heart problems.  SIGNS AND SYMPTOMS When  a clot forms, it can either partially or totally block the blood flow in that vein. Symptoms of a DVT can include:  Swelling of the leg or arm, especially if one side is much worse.  Warmth and redness of the leg or arm, especially if one side is much worse.  Pain in an arm or leg. If the clot is in the leg, symptoms may be more noticeable or worse when standing or walking. The symptoms of a DVT that has traveled to the lungs (pulmonary embolism, PE) usually start suddenly and include:  Shortness of breath.  Coughing.  Coughing up blood or blood-tinged phlegm.  Chest pain. The chest pain is often worse with deep breaths.  Rapid heartbeat. Anyone with these symptoms should get emergency medical treatment right away. Call your local emergency services (911 in the [place] if you have these symptoms. DIAGNOSIS If a DVT is suspected, your health care provider will take a full medical history and perform a physical exam. Tests that also may be required include:  Blood tests, including studies of the clotting properties of the blood.  Ultrasonography to see if you have clots in your legs or lungs.  X-rays to show the flow of blood when dye is injected into the veins (venography).  Studies of your lungs if you have any chest symptoms. PREVENTION  Exercise the legs regularly. Take a brisk 30-minute walk every day.  Maintain a weight that is appropriate for your height.  Avoid sitting or lying in bed   for long periods of time without moving your legs.  Women, particularly those over the age of 45 years, should consider the risks and benefits of taking estrogen medicines, including birth control pills.  Do not smoke, especially if you take estrogen medicines.  Long-distance travel can increase your risk of DVT. You should exercise your legs by walking or pumping the muscles every hour.  In-hospital prevention:  Many of the risk factors above relate to situations that exist with  hospitalization, either for illness, injury, or elective surgery.  Your health care provider will assess you for the need for venous thromboembolism prophylaxis when you are admitted to the hospital. If you are having surgery, your surgeon will assess you the day of or day after surgery.  Prevention may include medical and nonmedical measures. TREATMENT Once identified, a DVT can be treated. It can also be prevented in some circumstances. Once you have had a DVT, you may be at increased risk for a DVT in the future. The most common treatment for DVT is blood thinning (anticoagulant) medicine, which reduces the blood's tendency to clot. Anticoagulants can stop new blood clots from forming and stop old ones from growing. They cannot dissolve existing clots. Your body does this by itself over time. Anticoagulants can be given by mouth, by IV access, or by injection. Your health care provider will determine the best program for you. Other medicines or treatments that may be used are:  Heparin or related medicines (low molecular weight heparin) are usually the first treatment for a blood clot. They act quickly. However, they cannot be taken orally.  Heparin can cause a fall in a component of blood that stops bleeding and forms blood clots (platelets). You will be monitored with blood tests to be sure this does not occur.  Warfarin is an anticoagulant that can be swallowed. It takes a few days to start working, so usually heparin or related medicines are used in combination. Once warfarin is working, heparin is usually stopped.  Less commonly, clot dissolving drugs (thrombolytics) are used to dissolve a DVT. They carry a high risk of bleeding, so they are used mainly in severe cases, where your life or a limb is threatened.  Very rarely, a blood clot in the leg needs to be removed surgically.  If you are unable to take anticoagulants, your health care provider may arrange for you to have a filter placed  in a main vein in your abdomen. This filter prevents clots from traveling to your lungs. HOME CARE INSTRUCTIONS  Take all medicines prescribed by your health care provider. Only take over-the-counter or prescription medicines for pain, fever, or discomfort as directed by your health care provider.  Warfarin. Most people will continue taking warfarin after hospital discharge. Your health care provider will advise you on the length of treatment (usually 3 6 months, sometimes lifelong).  Too much and too little warfarin are both dangerous. Too much warfarin increases the risk of bleeding. Too little warfarin continues to allow the risk for blood clots. While taking warfarin, you will need to have regular blood tests to measure your blood clotting time. These blood tests usually include both the prothrombin time (PT) and international normalized ratio (INR) tests. The PT and INR results allow your health care provider to adjust your dose of warfarin. The dose can change for many reasons. It is critically important that you take warfarin exactly as prescribed, and that you have your PT and INR levels drawn exactly as directed.  Many foods, especially foods high in vitamin K, can interfere with warfarin and affect the PT and INR results. Foods high in vitamin K include spinach, kale, broccoli, cabbage, collard and turnip greens, brussel sprouts, peas, cauliflower, seaweed, and parsley as well as beef and pork liver, green tea, and soybean oil. You should eat a consistent amount of foods high in vitamin K. Avoid major changes in your diet, or notify your health care provider before changing your diet. Arrange a visit with a dietitian to answer your questions.  Many medicines can interfere with warfarin and affect the PT and INR results. You must tell your health care provider about any and all medicines you take. This includes all vitamins and supplements. Be especially cautious with aspirin and  anti-inflammatory medicines. Ask your health care provider before taking these. Do not take or discontinue any prescribed or over-the-counter medicine except on the advice of your health care provider or pharmacist.  Warfarin can have side effects, primarily excessive bruising or bleeding. You will need to hold pressure over cuts for longer than usual. Your health care provider or pharmacist will discuss other potential side effects.  Alcohol can change the body's ability to handle warfarin. It is best to avoid alcoholic drinks or consume only very small amounts while taking warfarin. Notify your health care provider if you change your alcohol intake.  Notify your dentist or other health care providers before procedures.  Activity. Ask your health care provider how soon you can go back to normal activities. It is important to stay active to prevent blood clots. If you are on anticoagulant medicine, avoid contact sports.  Exercise. It is very important to exercise. This is especially important while traveling, sitting, or standing for long periods of time. Exercise your legs by walking or by pumping the muscles frequently. Take frequent walks.  Compression stockings. These are tight elastic stockings that apply pressure to the lower legs. This pressure can help keep the blood in the legs from clotting. You may need to wear compression stockings at home to help prevent a DVT.  Do not smoke. If you smoke, quit. Ask your health care provider for help with quitting smoking.  Learn as much as you can about DVT. Knowing more about the condition should help you keep it from coming back.  Wear a medical alert bracelet or carry a medical alert card. SEEK MEDICAL CARE IF:  You notice a rapid heartbeat.  You feel weaker or more tired than usual.  You feel faint.  You notice increased bruising.  You feel your symptoms are not getting better in the time expected.  You believe you are having side  effects of medicine. SEEK IMMEDIATE MEDICAL CARE IF:  You have chest pain.  You have trouble breathing.  You have new or increased swelling or pain in one leg.  You cough up blood.  You notice blood in vomit, in a bowel movement, or in urine. MAKE SURE YOU:  Understand these instructions.  Will watch your condition.  Will get help right away if you are not doing well or get worse. Document Released: 06/06/2005 Document Revised: 03/27/2013 Document Reviewed: 02/11/2013 [facility] Patient Information 2014 [place].  Enoxaparin injection What is this medicine? ENOXAPARIN (ee nox a PA rin) is used after knee, hip, or abdominal surgeries to prevent blood clotting. It is also used to treat existing blood clots in the lungs or in the veins. This medicine may be used for other purposes; ask your health  care provider or pharmacist if you have questions. COMMON BRAND NAME(S): Lovenox What should I tell my health care provider before I take this medicine? They need to know if you have any of these conditions: -bleeding disorders, hemorrhage, or hemophilia -infection of the heart or heart valves -kidney or liver disease -previous stroke -prosthetic heart valve -recent surgery or delivery of a baby -ulcer in the stomach or intestine, diverticulitis, or other bowel disease -an unusual or allergic reaction to enoxaparin, heparin, pork or pork products, other medicines, foods, dyes, or preservatives -pregnant or trying to get pregnant -breast-feeding How should I use this medicine? This medicine is for injection under the skin. It is usually given by a health-care professional. You or a family member may be trained on how to give the injections. If you are to give yourself injections, make sure you understand how to use the syringe, measure the dose if necessary, and give the injection. To avoid bruising, do not rub the site where this medicine has been injected. Do not take your  medicine more often than directed. Do not stop taking except on the advice of your doctor or health care professional. Make sure you receive a puncture-resistant container to dispose of the needles and syringes once you have finished with them. Do not reuse these items. Return the container to your doctor or health care professional for proper disposal. Talk to your pediatrician regarding the use of this medicine in children. Special care may be needed. Overdosage: If you think you have taken too much of this medicine contact a poison control center or emergency room at once. NOTE: This medicine is only for you. Do not share this medicine with others. What if I miss a dose? If you miss a dose, take it as soon as you can. If it is almost time for your next dose, take only that dose. Do not take double or extra doses. What may interact with this medicine? Do not take this medicine with any of the following medications: -aspirin and aspirin-like medicines -heparin -mifepristone -palifermin -warfarin  This medicine may also interact with the following medications: -cilostazol -clopidogrel -dipyridamole -NSAIDs, medicines for pain and inflammation, like ibuprofen or naproxen -sulfinpyrazone -ticlopidine This list may not describe all possible interactions. Give your health care provider a list of all the medicines, herbs, non-prescription drugs, or dietary supplements you use. Also tell them if you smoke, drink alcohol, or use illegal drugs. Some items may interact with your medicine. What should I watch for while using this medicine? Visit your doctor or health care professional for regular checks on your progress. Your condition will be monitored carefully while you are receiving this medicine. Notify your doctor or health care professional and seek emergency treatment if you develop breathing problems; changes in vision; chest pain; severe, sudden headache; pain, swelling, warmth in the leg;  trouble speaking; sudden numbness or weakness of the face, arm, or leg. These can be signs that your condition has gotten worse. If you are going to have surgery, tell your doctor or health care professional that you are taking this medicine. Do not stop taking this medicine without first talking to your doctor. Be sure to refill your prescription before you run out of medicine. Avoid sports and activities that might cause injury while you are using this medicine. Severe falls or injuries can cause unseen bleeding. Be careful when using sharp tools or knives. Consider using an Copy. Take special care brushing or flossing your teeth. Report  any injuries, bruising, or red spots on the skin to your doctor or health care professional. What side effects may I notice from receiving this medicine? Side effects that you should report to your doctor or health care professional as soon as possible: -allergic reactions like skin rash, itching or hives, swelling of the face, lips, or tongue -feeling faint or lightheaded, falls -signs and symptoms of bleeding such as bloody or black, tarry stools; red or dark-brown urine; spitting up blood or brown material that looks like coffee grounds; red spots on the skin; unusual bruising or bleeding from the eye, gums, or nose  Side effects that usually do not require medical attention (report to your doctor or health care professional if they continue or are bothersome): -pain, redness, or irritation at site where injected This list may not describe all possible side effects. Call your doctor for medical advice about side effects. You may report side effects to FDA at 1-800-FDA-1088. Where should I keep my medicine? Keep out of the reach of children. Store at room temperature between 15 and 30 degrees C (59 and 86 degrees F). Do not freeze. If your injections have been specially prepared, you may need to store them in the refrigerator. Ask your pharmacist. Throw  away any unused medicine after the expiration date. NOTE: This sheet is a summary. It may not cover all possible information. If you have questions about this medicine, talk to your doctor, pharmacist, or health care provider.  2014, [employer]. (2012-10-02 16:13:24)

## 2013-11-14 NOTE — Telephone Encounter (Signed)
Positive for DVT in her left arm. Instructed to have her return to cancer center for initiation of anticoagulation. Per Dr. Benay Spice start her on Lovenox 1.5 mg/kg daily. 1st dose here. Dose will be Lovenox 100 mg daily for weight of 64.6 kg.  Script sent to pharmacy and injection nurse will observe patient's husband give 1st injection. Will be on Lovenox approximately 1 month per Dr. Benay Spice and may transition to Xarelto afterwards.

## 2013-11-14 NOTE — Progress Notes (Signed)
[facility] OFFICE PROGRESS NOTE   Diagnosis: Rectal cancer  INTERVAL HISTORY:   She completed a second cycle of FOLFOX 10/29/2013. She reports much less nausea following this cycle of chemotherapy. Cold sensitivity lasted approximately 5 days. She continues to have frequent output from the ileostomy. Good fluid intake and appetite. She now has no difficulty with urination. She continues to have numbness and pain at the perineum.  She presented to the emergency room 11/03/2013 with a fever and nausea. She was diagnosed with a urinary tract infection. She completed a course of ciprofloxacin.  She was seen in the emergency room 11/04/2013 with arm swelling. A Doppler was negative for a deep vein thrombosis. She saw Dr. [name] and was placed on aspirin. The left arm remains swollen and painful.  Objective:  Vital signs in last 24 hours:  Blood pressure 121/75, pulse 77, temperature 97.6 F (36.4 C), temperature source Oral, resp. rate 18, height 5\' 4"  (1.626 m), weight 142 lb 3.2 oz (64.501 kg).    HEENT: No thrush or ulcers Resp: Lungs clear bilaterally Cardio: Regular rate and rhythm GI:  No hepatomegaly, nontender, liquid in the right lower quadrant ileostomy bag Vascular: No leg edema. There is edema and venous engorgement throughout the left upper arm. The venous engorgement at the left upper anterior chest wall. Mild edema at the left lower arm and hand    Portacath/PICC-without erythema  Lab Results:  Lab Results  Component Value Date   WBC 6.0 11/14/2013   HGB 12.2 11/14/2013   HCT 36.6 11/14/2013   MCV 91.1 11/14/2013   PLT 132* 11/14/2013   NEUTROABS 4.3 11/14/2013     Lab Results  Component Value Date   CEA 3.2 05/29/2013     Medications: I have reviewed the patient's current medications.  Assessment/Plan: 1. Rectal cancer, clinical stage III (T3 N1) status post biopsy of a rectal mass on 05/24/2013 confirming adenocarcinoma. Endoscopic ultrasound  05/31/2013 measured the mass at 11 cm from the anal verge, [id number].  Initiation of radiation and concurrent Xeloda 06/18/2013; completion 07/26/2013.  Status post low anterior resection 09/02/2013. Invasive adenocarcinoma, 3.7 cm, negative margins; metastatic carcinoma in 7 of 11 lymph nodes; extensive lymph vascular involvement by tumor.  Cycle 1 adjuvant 10/15/2013 2. Post colonoscopy bowel perforation confirmed on abdomen CT 05/28/2013. 3. Single indeterminate liver lesion on the abdomen CT 05/28/2013. MRI on 06/18/2013 confirmed 2 tiny liver lesions, indeterminate-favored to be benign cysts. 4. History of cervical and lumbar disc disease.  5. History of HELLP syndrome with first pregnancy.  6. Delayed nausea following cycle 1 FOLFOX 7. History of pain with bowel movements. 8. Anastomotic leak on CT 09/13/2013. Status post exploratory laparotomy with drainage of a pelvic abscess, diverting loop ileostomy. Drainage catheter removed 10/23/2013 9. Perineal numbness, urinary retention. The urinary retention has improved 10. Abdominal and rectal pain. She is taking Percocet as needed. Improved. 11. Neutropenia secondary chemotherapy-she received Neulasta beginning with cycle 2 FOLFOX 12. Urinary tract infection, Escherichia coli-11/03/2013 13. Venous engorgement at the left anterior chest and left arm with swelling of the left arm and hand-I have a high clinical suspicion for a Port-A-Cath related deep vein thrombosis or subclavian stenosis, she will be referred for a repeat Doppler today   Disposition:  Ms. Rosasco has completed 2 cycles of adjuvant FOLFOX. The plan is to proceed with cycle 3 today. She had less nausea following cycle 2.  I am concerned she has developed a Port-A-Cath related left upper extremity  deep vein thrombosis. She will be referred for a repeat Doppler today. We will begin Lovenox anticoagulation if a DVT is confirmed.  She will return for an office visit and the next  cycle of chemotherapy in 2 weeks.  Ladell Pier, MD  11/14/2013  2:18 PM

## 2013-11-15 ENCOUNTER — Telehealth: Payer: Self-pay | Admitting: *Deleted

## 2013-11-15 NOTE — Telephone Encounter (Signed)
Per staff message and POF I have scheduled appts.  JMW  

## 2013-11-16 ENCOUNTER — Ambulatory Visit (HOSPITAL_BASED_OUTPATIENT_CLINIC_OR_DEPARTMENT_OTHER): Payer: 59

## 2013-11-16 VITALS — BP 97/66 | HR 87 | Temp 98.3°F | Resp 18

## 2013-11-16 DIAGNOSIS — D702 Other drug-induced agranulocytosis: Secondary | ICD-10-CM

## 2013-11-16 MED ORDER — HEPARIN SOD (PORK) LOCK FLUSH 100 UNIT/ML IV SOLN
500.0000 [IU] | Freq: Once | INTRAVENOUS | Status: AC | PRN
  Administered 2013-11-16: 500 [IU]
  Filled 2013-11-16: qty 5

## 2013-11-16 MED ORDER — SODIUM CHLORIDE 0.9 % IJ SOLN
10.0000 mL | INTRAMUSCULAR | Status: DC | PRN
  Administered 2013-11-16: 10 mL
  Filled 2013-11-16: qty 10

## 2013-11-16 MED ORDER — PEGFILGRASTIM INJECTION 6 MG/0.6ML
6.0000 mg | Freq: Once | SUBCUTANEOUS | Status: AC
  Administered 2013-11-16: 6 mg via SUBCUTANEOUS

## 2013-11-16 NOTE — Patient Instructions (Signed)

## 2013-11-20 ENCOUNTER — Encounter (HOSPITAL_COMMUNITY): Payer: Self-pay | Admitting: Emergency Medicine

## 2013-11-20 ENCOUNTER — Emergency Department (HOSPITAL_COMMUNITY): Payer: 59

## 2013-11-20 ENCOUNTER — Emergency Department (HOSPITAL_COMMUNITY)
Admission: EM | Admit: 2013-11-20 | Discharge: 2013-11-20 | Disposition: A | Discharge status: Home / Self Care | Payer: 59 | Attending: Emergency Medicine | Admitting: Emergency Medicine

## 2013-11-20 DIAGNOSIS — Z85038 Personal history of other malignant neoplasm of large intestine: Secondary | ICD-10-CM | POA: Insufficient documentation

## 2013-11-20 DIAGNOSIS — R110 Nausea: Secondary | ICD-10-CM | POA: Insufficient documentation

## 2013-11-20 DIAGNOSIS — Z79899 Other long term (current) drug therapy: Secondary | ICD-10-CM | POA: Insufficient documentation

## 2013-11-20 DIAGNOSIS — R509 Fever, unspecified: Secondary | ICD-10-CM | POA: Insufficient documentation

## 2013-11-20 LAB — COMPREHENSIVE METABOLIC PANEL
ALT: 134 U/L — ABNORMAL HIGH (ref 0–35)
AST: 75 U/L — AB (ref 0–37)
Albumin: 3.7 g/dL (ref 3.5–5.2)
Alkaline Phosphatase: 140 U/L — ABNORMAL HIGH (ref 39–117)
BUN: 10 mg/dL (ref 6–23)
CALCIUM: 9.2 mg/dL (ref 8.4–10.5)
CO2: 25 mEq/L (ref 19–32)
CREATININE: 0.67 mg/dL (ref 0.50–1.10)
Chloride: 100 mEq/L (ref 96–112)
GFR calc Af Amer: >90 mL/min (ref >90)
GFR calc non Af Amer: >90 mL/min (ref >90)
GLUCOSE: 103 mg/dL — AB (ref 70–99)
POTASSIUM: 4.1 meq/L (ref 3.7–5.3)
Sodium: 137 mEq/L (ref 137–147)
TOTAL PROTEIN: 6.9 g/dL (ref 6.0–8.3)
Total Bilirubin: 0.3 mg/dL (ref 0.3–1.2)

## 2013-11-20 LAB — CBC WITH DIFFERENTIAL/PLATELET
BASOS ABS: 0 10*3/uL (ref 0.0–0.1)
Basophils Relative: 0 % (ref 0–1)
Eosinophils Absolute: 0.1 10*3/uL (ref 0.0–0.7)
Eosinophils Relative: 1 % (ref 0–5)
HCT: 32.2 % — ABNORMAL LOW (ref 36.0–46.0)
Hemoglobin: 10.9 g/dL — ABNORMAL LOW (ref 12.0–15.0)
LYMPHS ABS: 0.7 10*3/uL (ref 0.7–4.0)
Lymphocytes Relative: 7 % — ABNORMAL LOW (ref 12–46)
MCH: 30.4 pg (ref 26.0–34.0)
MCHC: 33.9 g/dL (ref 30.0–36.0)
MCV: 89.9 fL (ref 78.0–100.0)
Monocytes Absolute: 1.1 10*3/uL — ABNORMAL HIGH (ref 0.1–1.0)
Monocytes Relative: 10 % (ref 3–12)
NEUTROS ABS: 9.2 10*3/uL — AB (ref 1.7–7.7)
NEUTROS PCT: 83 % — AB (ref 43–77)
PLATELETS: 89 10*3/uL — AB (ref 150–400)
RBC: 3.58 MIL/uL — ABNORMAL LOW (ref 3.87–5.11)
RDW: 15 % (ref 11.5–15.5)
WBC: 11.1 10*3/uL — ABNORMAL HIGH (ref 4.0–10.5)

## 2013-11-20 LAB — URINALYSIS, ROUTINE W REFLEX MICROSCOPIC
BILIRUBIN URINE: NEGATIVE
Glucose, UA: 250 mg/dL — AB
Hgb urine dipstick: NEGATIVE
KETONES UR: NEGATIVE mg/dL
LEUKOCYTES UA: NEGATIVE
NITRITE: NEGATIVE
PH: 5 (ref 5.0–8.0)
PROTEIN: NEGATIVE mg/dL
Specific Gravity, Urine: 1.026 (ref 1.005–1.030)
Urobilinogen, UA: 0.2 mg/dL (ref 0.0–1.0)

## 2013-11-20 LAB — I-STAT CG4 LACTIC ACID, ED: Lactic Acid, Venous: 1.85 mmol/L (ref 0.5–2.2)

## 2013-11-20 MED ORDER — ACETAMINOPHEN 325 MG PO TABS
650.0000 mg | ORAL_TABLET | Freq: Four times a day (QID) | ORAL | Status: DC | PRN
  Administered 2013-11-20: 650 mg via ORAL
  Filled 2013-11-20: qty 2

## 2013-11-20 MED ORDER — ONDANSETRON 4 MG PO TBDP
4.0000 mg | ORAL_TABLET | Freq: Once | ORAL | Status: AC
  Administered 2013-11-20: 4 mg via ORAL
  Filled 2013-11-20: qty 1

## 2013-11-20 NOTE — Discharge Instructions (Signed)
Rest. Drink plenty of fluids. Follow up with your doctor for recheck tomorrow.  We did send blood cultures the results of which should come back in the next couple days - have your doctor follow up on those results then. From todays labs tests, a couple of your liver function tests are elevated (AST 75, ALT 134) - discuss these results with your doctor at follow up. Return to ER right away if worse, new symptoms, new pain, weak/faint, trouble breathing, other concern.     Fever, Adult A fever is a higher than normal body temperature. In an adult, an oral temperature around 98.6 F (37 C) is considered normal. A temperature of 100.4 F (38 C) or higher is generally considered a fever. Mild or moderate fevers generally have no long-term effects and often do not require treatment. Extreme fever (greater than or equal to 106 F or 41.1 C) can cause seizures. The sweating that may occur with repeated or prolonged fever may cause dehydration. Elderly people can develop confusion during a fever. A measured temperature can vary with:  Age.  Time of day.  Method of measurement (mouth, underarm, rectal, or ear). The fever is confirmed by taking a temperature with a thermometer. Temperatures can be taken different ways. Some methods are accurate and some are not.  An oral temperature is used most commonly. Electronic thermometers are fast and accurate.  An ear temperature will only be accurate if the thermometer is positioned as recommended by the manufacturer.  A rectal temperature is accurate and done for those adults who have a condition where an oral temperature cannot be taken.  An underarm (axillary) temperature is not accurate and not recommended. Fever is a symptom, not a disease.  CAUSES   Infections commonly cause fever.  Some noninfectious causes for fever include:  Some arthritis conditions.  Some thyroid or adrenal gland conditions.  Some immune system conditions.  Some  types of cancer.  A medicine reaction.  High doses of certain street drugs such as methamphetamine.  Dehydration.  Exposure to high outside or room temperatures.  Occasionally, the source of a fever cannot be determined. This is sometimes called a "fever of unknown origin" (FUO).  Some situations may lead to a temporary rise in body temperature that may go away on its own. Examples are:  Childbirth.  Surgery.  Intense exercise. HOME CARE INSTRUCTIONS   Take appropriate medicines for fever. Follow dosing instructions carefully. If you use acetaminophen to reduce the fever, be careful to avoid taking other medicines that also contain acetaminophen. Do not take aspirin for a fever if you are younger than age 60. There is an association with Reye's syndrome. Reye's syndrome is a rare but potentially deadly disease.  If an infection is present and antibiotics have been prescribed, take them as directed. Finish them even if you start to feel better.  Rest as needed.  Maintain an adequate fluid intake. To prevent dehydration during an illness with prolonged or recurrent fever, you may need to drink extra fluid.Drink enough fluids to keep your urine clear or pale yellow.  Sponging or bathing with room temperature water may help reduce body temperature. Do not use ice water or alcohol sponge baths.  Dress comfortably, but do not over-bundle. SEEK MEDICAL CARE IF:   You are unable to keep fluids down.  You develop vomiting or diarrhea.  You are not feeling at least partly better after 3 days.  You develop new symptoms or problems. [place]  CARE IF:   You have shortness of breath or trouble breathing.  You develop excessive weakness.  You are dizzy or you faint.  You are extremely thirsty or you are making little or no urine.  You develop new pain that was not there before (such as in the head, neck, chest, back, or abdomen).  You have persistant vomiting and  diarrhea for more than 1 to 2 days.  You develop a stiff neck or your eyes become sensitive to light.  You develop a skin rash.  You have a fever or persistent symptoms for more than 2 to 3 days.  You have a fever and your symptoms suddenly get worse. MAKE SURE YOU:   Understand these instructions.  Will watch your condition.  Will get help right away if you are not doing well or get worse. Document Released: 11/30/2000 Document Revised: 08/29/2011 Document Reviewed: 04/07/2011 [facility] Patient Information 2014 [place], [place].

## 2013-11-20 NOTE — ED Provider Notes (Signed)
CSN: [id number]     Arrival date & time 11/20/13  1800 History   First MD Initiated Contact with Patient 11/20/13 1926     Chief Complaint  Patient presents with  . Fever  . Nausea     (Consider location/radiation/quality/duration/timing/severity/associated sxs/prior Treatment) HPI Pt is a 53yo female with hx of colorectal cancer for which she is being tx with chemotherapy, last on Thursday, 5/28, and Saturday, 5/30, presenting to ED c/o fever, Tmax 102.7 and nausea.  Pt states last time she felt this way, about 1 week ago, she was dx with a UTI and tx with Cipro x7 days. Pt states she believed she had recovered completely from first UTI but did not have her urine rechecked. Pt states she has to self-cath at least once daily as she does not have the normal sensation to urinate so she does not feel any burning with urination and no urgency or frequency sensation.  Pt has taken percocet for fever.  Denies vomiting. Denies hx of renal stones or blood in urine. Denies chest pain, SOB, cough, congestion, or abdominal pain.  Denies sick contacts.   Past Medical History  Diagnosis Date  . History of radiation therapy 06/18/13-07/26/13    rectal 50.4Gy total dose  . IUD (intrauterine device) in place 08-27-13    Mirena Implant inplace  . Arthritis 08-27-13    at present has ruptured disc- lower back-not an issue now  . Colon cancer 08-27-13    radiation /chemo -last ended 4 weeks(Dr. Benay Spice)  . PONV (postoperative nausea and vomiting)     scopalamine patch helped   Past Surgical History  Procedure Laterality Date  . Back surgery    . Cesarean section      x2  . Cervical disc arthroplasty  01/03/2012    Procedure: CERVICAL ANTERIOR DISC ARTHROPLASTY;  Surgeon: Charlie Pitter, MD;  Location: [facility];  Service: Neurosurgery;  Laterality: N/A;  Cervical Anterior Disc Arthorplasty Five-Six  . Eus N/A 05/31/2013    Procedure: LOWER ENDOSCOPIC ULTRASOUND (EUS);  Surgeon: Beryle Beams, MD;   Location: [facility];  Service: Endoscopy;  Laterality: N/A;  . Intrauterine device insertion  2006    [facility]  . Knee arthroscopy Left 08-27-13    torn meniscus repair  . Breast surgery      reduction surgery  . Refractive surgery    . Laparoscopic low anterior resection N/A 09/02/2013    Procedure: LAPAROSCOPIC LOW ANTERIOR RESECTION;  Surgeon: [name], MD;  Location: [facility];  Service: General;  Laterality: N/A;  . Laparotomy N/A 09/13/2013    Procedure: EXPLORATORY LAPAROTOMY / FLEXIBLE SIGMOIDOSCOPY/  DRAINAGE OF PELVIC ABSCESS WITH  DIVERTING  LOOP  ILEOSTOMY;  Surgeon: [name], MD;  Location: [facility];  Service: General;  Laterality: N/A;  . Portacath placement Left 10/11/2013    Procedure: INSERTION PORT-A-CATH;  Surgeon: [name], MD;  Location: [facility];  Service: General;  Laterality: Left;   No family history on file. History  Substance Use Topics  . Smoking status: Never Smoker   . Smokeless tobacco: Not on file  . Alcohol Use: Yes     Comment: weekends   OB History   Grav Para Term Preterm Abortions TAB SAB Ect Mult Living                 Review of Systems  Constitutional: Positive for fever. Negative for chills and fatigue.  HENT: Negative for congestion, sore throat and  voice change.   Respiratory: Negative for cough and shortness of breath.   Cardiovascular: Negative for chest pain and palpitations.  Gastrointestinal: Positive for nausea. Negative for vomiting and abdominal pain.       Pt has colostomy bag  Genitourinary: Negative for dysuria, urgency, frequency, hematuria, vaginal bleeding, vaginal discharge and vaginal pain.  Musculoskeletal: Negative for back pain and myalgias.  All other systems reviewed and are negative.    Allergies  Review of patient's allergies indicates no known allergies.  Home Medications   Prior to Admission medications   Medication Sig Start Date End Date Taking? Authorizing Provider  ALPRAZolam Duanne Moron) 0.5 MG  tablet Take 1 tablet (0.5 mg total) by mouth at bedtime as needed. 10/29/13  Yes Ladell Pier, MD  enoxaparin (LOVENOX) 100 MG/ML injection Inject 1 mL (100 mg total) into the skin daily. 11/14/13  Yes Ladell Pier, MD  estradiol (VIVELLE-DOT) 0.05 MG/24HR Place 1 patch onto the skin 2 (two) times a week. Sundays and Wednesday   Yes Historical Provider, MD  lidocaine-prilocaine (EMLA) cream  10/11/13  Yes Historical Provider, MD  ondansetron (ZOFRAN ODT) 8 MG disintegrating tablet Take 1 tablet (8 mg total) by mouth every 8 (eight) hours as needed for nausea or vomiting. 10/17/13  Yes Owens Shark, NP  ondansetron (ZOFRAN) 4 MG tablet Take 1 tablet (4 mg total) by mouth every 6 (six) hours. 11/03/13  Yes Kristen N Ward, DO  oxyCODONE-acetaminophen (PERCOCET/ROXICET) 5-325 MG per tablet Take 1-2 tablets by mouth every 6 (six) hours as needed for moderate pain or severe pain. 11/14/13  Yes Ladell Pier, MD  prochlorperazine (COMPAZINE) 10 MG tablet Take 1 tablet (10 mg total) by mouth every 6 (six) hours as needed for nausea or vomiting. 11/14/13  Yes Ladell Pier, MD  sertraline (ZOLOFT) 50 MG tablet Take 50 mg by mouth every morning.  03/21/13  Yes Historical Provider, MD  PRESCRIPTION MEDICATION Oxaliplatin/Fluorouracil,every other week at the [facility] under the care of Dr. Benay Spice    Historical Provider, MD  progesterone (PROMETRIUM) 200 MG capsule Take 200 mg by mouth. Every 3 months 03/21/13   Historical Provider, MD   BP 107/64  Pulse 77  Temp(Src) 98.8 F (37.1 C) (Oral)  Resp 18  SpO2 99% Physical Exam  Nursing note and vitals reviewed. Constitutional: She appears well-developed and well-nourished. No distress.  Pt lying in exam bed, appears well, non-toxic. NAD  HENT:  Head: Normocephalic and atraumatic.  Eyes: Conjunctivae are normal. No scleral icterus.  Neck: Normal range of motion.  Cardiovascular: Normal rate, regular rhythm and normal heart sounds.   Pulmonary/Chest: Effort  normal and breath sounds normal. No respiratory distress. She has no wheezes. She has no rales. She exhibits no tenderness.  No respiratory distress, able to speak in full sentences w/o difficulty. Lungs: CTAB  Abdominal: Soft. Bowel sounds are normal. She exhibits no distension and no mass. There is no tenderness. There is no rebound and no guarding.  Well healed surgical scars. Colostomy bag in place. Appears clean. No surrounding erythema or tenderness. Soft, non-distended, non-tender.  Musculoskeletal: Normal range of motion.  Neurological: She is alert.  Skin: Skin is warm and dry. She is not diaphoretic.    ED Course  Procedures (including critical care time) Labs Review Labs Reviewed  CBC WITH DIFFERENTIAL - Abnormal; Notable for the following:    WBC 11.1 (*)    RBC 3.58 (*)    Hemoglobin 10.9 (*)  HCT 32.2 (*)    Platelets 89 (*)    Neutrophils Relative % 83 (*)    Neutro Abs 9.2 (*)    Lymphocytes Relative 7 (*)    Monocytes Absolute 1.1 (*)    All other components within normal limits  COMPREHENSIVE METABOLIC PANEL - Abnormal; Notable for the following:    Glucose, Bld 103 (*)    AST 75 (*)    ALT 134 (*)    Alkaline Phosphatase 140 (*)    All other components within normal limits  URINALYSIS, ROUTINE W REFLEX MICROSCOPIC - Abnormal; Notable for the following:    Glucose, UA 250 (*)    All other components within normal limits  URINE CULTURE  CULTURE, BLOOD (ROUTINE X 2)  CULTURE, BLOOD (ROUTINE X 2)  I-STAT CG4 LACTIC ACID, ED    Imaging Review Dg Chest 2 View  11/20/2013   CLINICAL DATA:  History of colon cancer. Patient undergoing chemotherapy. Nausea and fever.  EXAM: CHEST  2 VIEW  COMPARISON:  PA and lateral chest 11/03/2013.  FINDINGS: Left subclavian approach Port-A-Cath is again seen. There is unchanged scarring in the left lower lobe. The lungs are otherwise clear. Heart size is normal. No pneumothorax or pleural effusion is identified.  IMPRESSION: No  acute disease.   Electronically Signed   By: Inge Rise M.D.   On: 11/20/2013 20:46   US Abdomen Complete  11/20/2013   CLINICAL DATA:  Elevated liver function tests  EXAM: ULTRASOUND ABDOMEN COMPLETE  COMPARISON:  CT, 09/23/2013  FINDINGS: Gallbladder:  No gallstones or wall thickening visualized. No sonographic Murphy sign noted.  Common bile duct:  Diameter: 5 mm  Liver:  No focal lesion identified. Within normal limits in parenchymal echogenicity.  IVC:  No abnormality visualized.  Pancreas:  Visualized portion unremarkable.  Spleen:  Size and appearance within normal limits.  Right Kidney:  Length: 11.9 cm. Echogenicity within normal limits. No mass or hydronephrosis visualized.  Left Kidney:  Length: 11.1 cm. Echogenicity within normal limits. No mass or hydronephrosis visualized. Small echogenic focus in the midpole consistent with a vascular calcification or small nonobstructing stone.  Abdominal aorta:  No aneurysm visualized.  Other findings:  None.  IMPRESSION: 1. No acute findings. 2. Small nonobstructing stone or vascular calcification in left kidney. 3. No other abnormalities. Normal sonographic appearance of the liver.   Electronically Signed   By: Lajean Manes M.D.   On: 11/20/2013 22:31     EKG Interpretation None      MDM   Final diagnoses:  Nausea  Fever    Pt is a 53yo female with hx of colorectal cancer currently undergoing chemotherapy tx, c/o nausea and fever. Pt concerned she has UTI as she had similar sx last time she had a UTI about 1 week ago.  Pt otherwise appears well, non-toxic. Temp: 100.4 in ED.  Pt given zofran and acetaminophen in ED.  Temp improved to 98.8 and nausea has improved.   Labs: CBC-mildly increased WBC-11.1, CMP-mildly elevated LFTs-new compared to previous labs. UA-unremarkable.  Due to elevated LFTs and hx of chemo tx with no obvious source of fever, Abd U/S and CXR also performed. Blood cultures sent.  Imaging: unremarkable.  Fever likely due  to viral illness. Blood cultures pending. Pt may be discharged home and f/u with oncologist for blood cultures. Pt and husband feel comfortable with this plan. Return precautions provided. Pt verbalized understanding and agreement with tx plan.  Discussed pt with Dr. Ashok Cordia  who agrees with tx plan.      Noland Fordyce, PA-C 11/20/13 [id number]

## 2013-11-20 NOTE — ED Notes (Signed)
Pt states that she has rectal cancer.  Had chemo Thursday and Saturday.  Today, started having fever.  Took a percocet for fever.  States that she had a UTI a couple weeks ago and is wondering if it has returned.  States that she self-caths and does not have any sensation so she does not know if she has burning with urination.  Also c/o nausea but no vomiting.

## 2013-11-20 NOTE — ED Notes (Signed)
Lactic results given to Ashok Cordia, MD

## 2013-11-21 ENCOUNTER — Telehealth: Payer: Self-pay | Admitting: *Deleted

## 2013-11-21 NOTE — Telephone Encounter (Signed)
Call from pt's husband to F/U on blood cultures done in ED. He reports fever continued through the night, pt last took Tylenol at 0300. Woke up this morning with normal temp. Eating breakfast, denies nausea. Informed him that cultures are still pending, will call when resulted. He voiced understanding.

## 2013-11-21 NOTE — Telephone Encounter (Signed)
Message copied by Brien Few on Thu Nov 21, 2013  8:53 AM ------      Message from: Betsy Coder B      Created: Thu Nov 21, 2013  7:14 AM       Please check on her 6/4 ------

## 2013-11-22 LAB — URINE CULTURE
COLONY COUNT: NO GROWTH
CULTURE: NO GROWTH

## 2013-11-23 NOTE — ED Provider Notes (Signed)
Medical screening examination/treatment/procedure(s) were conducted as a shared visit with non-physician practitioner(s) and myself.  I personally evaluated the patient during the encounter.   EKG Interpretation None      Pt with ca hx, chemo, presents w fever, nausea. No sob. Chest cta. abd soft nt. Labs, cx.  Results for orders placed during the hospital encounter of 11/20/13  URINE CULTURE      Result Value Ref Range   Specimen Description URINE, RANDOM     Special Requests NONE     Culture  Setup Time       Value: 11/20/2013 22:50     Performed at [facility]       Value: NO GROWTH     Performed at [facility]   Culture       Value: NO GROWTH     Performed at [facility]   Report Status 11/22/2013 FINAL    CULTURE, BLOOD (ROUTINE X 2)      Result Value Ref Range   Specimen Description BLOOD RIGHT ANTECUBITAL     Special Requests BOTTLES DRAWN AEROBIC AND ANAEROBIC 5CC     Culture  Setup Time       Value: 11/21/2013 00:43     Performed at [facility]   Culture       Value:        BLOOD CULTURE RECEIVED NO GROWTH TO DATE CULTURE WILL BE HELD FOR 5 DAYS BEFORE ISSUING A FINAL NEGATIVE REPORT     Performed at [facility]   Report Status PENDING    CULTURE, BLOOD (ROUTINE X 2)      Result Value Ref Range   Specimen Description BLOOD RIGHT HAND     Special Requests BOTTLES DRAWN AEROBIC AND ANAEROBIC 3CC     Culture  Setup Time       Value: 11/21/2013 00:43     Performed at [facility]   Culture       Value:        BLOOD CULTURE RECEIVED NO GROWTH TO DATE CULTURE WILL BE HELD FOR 5 DAYS BEFORE ISSUING A FINAL NEGATIVE REPORT     Performed at [facility]   Report Status PENDING    CBC WITH DIFFERENTIAL      Result Value Ref Range   WBC 11.1 (*) 4.0 - 10.5 K/uL   RBC 3.58 (*) 3.87 - 5.11 MIL/uL   Hemoglobin 10.9 (*) 12.0 - 15.0 g/dL   HCT 32.2 (*) 36.0 - 46.0 %   MCV 89.9  78.0 - 100.0 fL   MCH 30.4  26.0 - 34.0 pg   MCHC 33.9  30.0 - 36.0 g/dL   RDW 15.0  11.5 - 15.5 %   Platelets 89 (*) 150 - 400 K/uL   Neutrophils Relative % 83 (*) 43 - 77 %   Neutro Abs 9.2 (*) 1.7 - 7.7 K/uL   Lymphocytes Relative 7 (*) 12 - 46 %   Lymphs Abs 0.7  0.7 - 4.0 K/uL   Monocytes Relative 10  3 - 12 %   Monocytes Absolute 1.1 (*) 0.1 - 1.0 K/uL   Eosinophils Relative 1  0 - 5 %   Eosinophils Absolute 0.1  0.0 - 0.7 K/uL   Basophils Relative 0  0 - 1 %   Basophils Absolute 0.0  0.0 - 0.1 K/uL  COMPREHENSIVE METABOLIC PANEL      Result Value Ref Range  Sodium 137  137 - 147 mEq/L   Potassium 4.1  3.7 - 5.3 mEq/L   Chloride 100  96 - 112 mEq/L   CO2 25  19 - 32 mEq/L   Glucose, Bld 103 (*) 70 - 99 mg/dL   BUN 10  6 - 23 mg/dL   Creatinine, Ser 0.67  0.50 - 1.10 mg/dL   Calcium 9.2  8.4 - 10.5 mg/dL   Total Protein 6.9  6.0 - 8.3 g/dL   Albumin 3.7  3.5 - 5.2 g/dL   AST 75 (*) 0 - 37 U/L   ALT 134 (*) 0 - 35 U/L   Alkaline Phosphatase 140 (*) 39 - 117 U/L   Total Bilirubin 0.3  0.3 - 1.2 mg/dL   GFR calc non Af Amer >90  >90 mL/min   GFR calc Af Amer >90  >90 mL/min  URINALYSIS, ROUTINE W REFLEX MICROSCOPIC      Result Value Ref Range   Color, Urine YELLOW  YELLOW   APPearance CLEAR  CLEAR   Specific Gravity, Urine 1.026  1.005 - 1.030   pH 5.0  5.0 - 8.0   Glucose, UA 250 (*) NEGATIVE mg/dL   Hgb urine dipstick NEGATIVE  NEGATIVE   Bilirubin Urine NEGATIVE  NEGATIVE   Ketones, ur NEGATIVE  NEGATIVE mg/dL   Protein, ur NEGATIVE  NEGATIVE mg/dL   Urobilinogen, UA 0.2  0.0 - 1.0 mg/dL   Nitrite NEGATIVE  NEGATIVE   Leukocytes, UA NEGATIVE  NEGATIVE  I-STAT CG4 LACTIC ACID, ED      Result Value Ref Range   Lactic Acid, Venous 1.85  0.5 - 2.2 mmol/L   Dg Chest 2 View  11/20/2013   CLINICAL DATA:  History of colon cancer. Patient undergoing chemotherapy. Nausea and fever.  EXAM: CHEST  2 VIEW  COMPARISON:  PA and lateral chest 11/03/2013.  FINDINGS: Left subclavian approach  Port-A-Cath is again seen. There is unchanged scarring in the left lower lobe. The lungs are otherwise clear. Heart size is normal. No pneumothorax or pleural effusion is identified.  IMPRESSION: No acute disease.   Electronically Signed   By: Inge Rise M.D.   On: 11/20/2013 20:46   Dg Chest 2 View  11/03/2013   CLINICAL DATA:  ARM SWELLING  EXAM: CHEST  2 VIEW  COMPARISON:  DG CHEST 2 VIEW dated 11/03/2013; DG CHEST 1V PORT dated 10/11/2013; CT CHEST W/O CM dated 06/11/2013  FINDINGS: The heart size and mediastinal contours are within normal limits. Linear areas of scarring versus atelectasis within the lung bases. A left porta catheter with tip projecting in the region of the superior vena cava right atrial junction. Both lungs are otherwise clear. The visualized skeletal structures are unremarkable.  IMPRESSION: No active cardiopulmonary disease. Scarring versus atelectasis within the lung bases.   Electronically Signed   By: Margaree Mackintosh M.D.   On: 11/03/2013 23:03   Dg Chest 2 View  11/03/2013   CLINICAL DATA:  Fever; rectal carcinoma  EXAM: CHEST  2 VIEW  COMPARISON:  October 11, 2013  FINDINGS: Port-A-Cath tip is at cavoatrial junction. No pneumothorax. There is scarring in the left base medially, stable. Elsewhere lungs are clear. Heart size and pulmonary vascularity are normal. No adenopathy. There is degenerative change in the thoracic spine.  IMPRESSION: Scarring left base.  No edema or consolidation.   Electronically Signed   By: Lowella Grip M.D.   On: 11/03/2013 07:35   US Abdomen Complete  11/20/2013  CLINICAL DATA:  Elevated liver function tests  EXAM: ULTRASOUND ABDOMEN COMPLETE  COMPARISON:  CT, 09/23/2013  FINDINGS: Gallbladder:  No gallstones or wall thickening visualized. No sonographic Murphy sign noted.  Common bile duct:  Diameter: 5 mm  Liver:  No focal lesion identified. Within normal limits in parenchymal echogenicity.  IVC:  No abnormality visualized.  Pancreas:   Visualized portion unremarkable.  Spleen:  Size and appearance within normal limits.  Right Kidney:  Length: 11.9 cm. Echogenicity within normal limits. No mass or hydronephrosis visualized.  Left Kidney:  Length: 11.1 cm. Echogenicity within normal limits. No mass or hydronephrosis visualized. Small echogenic focus in the midpole consistent with a vascular calcification or small nonobstructing stone.  Abdominal aorta:  No aneurysm visualized.  Other findings:  None.  IMPRESSION: 1. No acute findings. 2. Small nonobstructing stone or vascular calcification in left kidney. 3. No other abnormalities. Normal sonographic appearance of the liver.   Electronically Signed   By: Lajean Manes M.D.   On: 11/20/2013 22:31      Mirna Mires, MD 11/23/13 [phone]

## 2013-11-24 ENCOUNTER — Other Ambulatory Visit: Payer: Self-pay | Admitting: Oncology

## 2013-11-27 ENCOUNTER — Ambulatory Visit (HOSPITAL_BASED_OUTPATIENT_CLINIC_OR_DEPARTMENT_OTHER): Payer: 59 | Admitting: Nurse Practitioner

## 2013-11-27 ENCOUNTER — Other Ambulatory Visit (HOSPITAL_BASED_OUTPATIENT_CLINIC_OR_DEPARTMENT_OTHER): Payer: 59

## 2013-11-27 ENCOUNTER — Ambulatory Visit (HOSPITAL_BASED_OUTPATIENT_CLINIC_OR_DEPARTMENT_OTHER): Payer: 59

## 2013-11-27 ENCOUNTER — Telehealth: Payer: Self-pay | Admitting: Oncology

## 2013-11-27 VITALS — BP 107/76 | HR 82 | Temp 97.3°F

## 2013-11-27 VITALS — Ht 64.0 in | Wt 136.8 lb

## 2013-11-27 DIAGNOSIS — C20 Malignant neoplasm of rectum: Secondary | ICD-10-CM

## 2013-11-27 DIAGNOSIS — R110 Nausea: Secondary | ICD-10-CM

## 2013-11-27 DIAGNOSIS — R109 Unspecified abdominal pain: Secondary | ICD-10-CM

## 2013-11-27 DIAGNOSIS — K6289 Other specified diseases of anus and rectum: Secondary | ICD-10-CM

## 2013-11-27 DIAGNOSIS — I82409 Acute embolism and thrombosis of unspecified deep veins of unspecified lower extremity: Secondary | ICD-10-CM

## 2013-11-27 DIAGNOSIS — Z5111 Encounter for antineoplastic chemotherapy: Secondary | ICD-10-CM

## 2013-11-27 LAB — CBC WITH DIFFERENTIAL/PLATELET
BASO%: 0.7 % (ref 0.0–2.0)
Basophils Absolute: 0 10*3/uL (ref 0.0–0.1)
EOS%: 5.4 % (ref 0.0–7.0)
Eosinophils Absolute: 0.4 10*3/uL (ref 0.0–0.5)
HEMATOCRIT: 38.6 % (ref 34.8–46.6)
HGB: 12.6 g/dL (ref 11.6–15.9)
LYMPH#: 0.9 10*3/uL (ref 0.9–3.3)
LYMPH%: 13.2 % — AB (ref 14.0–49.7)
MCH: 30.1 pg (ref 25.1–34.0)
MCHC: 32.7 g/dL (ref 31.5–36.0)
MCV: 92 fL (ref 79.5–101.0)
MONO#: 0.4 10*3/uL (ref 0.1–0.9)
MONO%: 6.2 % (ref 0.0–14.0)
NEUT%: 74.5 % (ref 38.4–76.8)
NEUTROS ABS: 5.1 10*3/uL (ref 1.5–6.5)
Platelets: 153 10*3/uL (ref 145–400)
RBC: 4.19 10*6/uL (ref 3.70–5.45)
RDW: 16.7 % — AB (ref 11.2–14.5)
WBC: 6.8 10*3/uL (ref 3.9–10.3)

## 2013-11-27 LAB — COMPREHENSIVE METABOLIC PANEL (CC13)
ALBUMIN: 3.7 g/dL (ref 3.5–5.0)
ALT: 59 U/L — ABNORMAL HIGH (ref 0–55)
ANION GAP: 9 meq/L (ref 3–11)
AST: 26 U/L (ref 5–34)
Alkaline Phosphatase: 123 U/L (ref 40–150)
BUN: 12.5 mg/dL (ref 7.0–26.0)
CHLORIDE: 109 meq/L (ref 98–109)
CO2: 22 meq/L (ref 22–29)
Calcium: 9.3 mg/dL (ref 8.4–10.4)
Creatinine: 0.8 mg/dL (ref 0.6–1.1)
GLUCOSE: 92 mg/dL (ref 70–140)
POTASSIUM: 4.1 meq/L (ref 3.5–5.1)
SODIUM: 140 meq/L (ref 136–145)
TOTAL PROTEIN: 7.2 g/dL (ref 6.4–8.3)
Total Bilirubin: 0.26 mg/dL (ref 0.20–1.20)

## 2013-11-27 LAB — CULTURE, BLOOD (ROUTINE X 2)
Culture: NO GROWTH
Culture: NO GROWTH

## 2013-11-27 MED ORDER — DEXTROSE 5 % IV SOLN
Freq: Once | INTRAVENOUS | Status: AC
  Administered 2013-11-27: 11:00:00 via INTRAVENOUS

## 2013-11-27 MED ORDER — DEXTROSE 5 % IV SOLN
85.0000 mg/m2 | Freq: Once | INTRAVENOUS | Status: AC
  Administered 2013-11-27: 145 mg via INTRAVENOUS
  Filled 2013-11-27: qty 29

## 2013-11-27 MED ORDER — FLUOROURACIL CHEMO INJECTION 2.5 GM/50ML
400.0000 mg/m2 | Freq: Once | INTRAVENOUS | Status: AC
  Administered 2013-11-27: 700 mg via INTRAVENOUS
  Filled 2013-11-27: qty 14

## 2013-11-27 MED ORDER — ONDANSETRON HCL 4 MG PO TABS: 4.0000 mg | ORAL_TABLET | Freq: Three times a day (TID) | ORAL | Status: DC | PRN

## 2013-11-27 MED ORDER — PALONOSETRON HCL INJECTION 0.25 MG/5ML
INTRAVENOUS | Status: AC
  Filled 2013-11-27: qty 5

## 2013-11-27 MED ORDER — DEXAMETHASONE SODIUM PHOSPHATE 10 MG/ML IJ SOLN
10.0000 mg | Freq: Once | INTRAMUSCULAR | Status: AC
  Administered 2013-11-27: 10 mg via INTRAVENOUS

## 2013-11-27 MED ORDER — DEXAMETHASONE 4 MG PO TABS: 4.0000 mg | ORAL_TABLET | Freq: Two times a day (BID) | ORAL | Status: DC

## 2013-11-27 MED ORDER — LEUCOVORIN CALCIUM INJECTION 350 MG
414.0000 mg/m2 | Freq: Once | INTRAVENOUS | Status: AC
  Administered 2013-11-27: 700 mg via INTRAVENOUS
  Filled 2013-11-27: qty 35

## 2013-11-27 MED ORDER — PALONOSETRON HCL INJECTION 0.25 MG/5ML
0.2500 mg | Freq: Once | INTRAVENOUS | Status: AC
  Administered 2013-11-27: 0.25 mg via INTRAVENOUS

## 2013-11-27 MED ORDER — SODIUM CHLORIDE 0.9 % IV SOLN
150.0000 mg | Freq: Once | INTRAVENOUS | Status: AC
  Administered 2013-11-27: 150 mg via INTRAVENOUS
  Filled 2013-11-27: qty 5

## 2013-11-27 MED ORDER — SODIUM CHLORIDE 0.9 % IV SOLN
2400.0000 mg/m2 | INTRAVENOUS | Status: DC
  Administered 2013-11-27: 4050 mg via INTRAVENOUS
  Filled 2013-11-27: qty 81

## 2013-11-27 MED ORDER — DEXAMETHASONE SODIUM PHOSPHATE 10 MG/ML IJ SOLN
INTRAMUSCULAR | Status: AC
  Filled 2013-11-27: qty 1

## 2013-11-27 NOTE — Progress Notes (Signed)
[facility] OFFICE PROGRESS NOTE   Diagnosis:  Rectal cancer.  INTERVAL HISTORY:   She returns as scheduled. She completed cycle 3 FOLFOX on 11/14/2013. She noted nausea beginning day 2 and lasting for one week. Zofran was effective. She denies mouth sores. She has noted some herpetic-type lip lesions. She has watery output from the ostomy. Cold sensitivity lasted 4 days. No persistent neuropathy symptoms. She continues to have intermittent pain at the left upper chest/arm.  She was seen in the emergency Department on 11/20/2013 for a fever. Blood and urine cultures were negative. The fever resolved within 24 hours.  Objective:  Vital signs in last 24 hours:  Blood pressure 107/76, pulse 82, temperature 97.3 F (36.3 C), temperature source Oral.    HEENT: No thrush or ulcerations. 2 herpetic-type lesions. Resp: Lungs clear. Cardio: Regular cardiac rhythm. GI: Abdomen soft and nontender. No hepatomegaly. Right lower quadrant ileostomy. Vascular: No leg edema. Edema and venous engorgement left upper chest/upper arm. Mild edema at the left lower arm and hand.   Port-A-Cath site without erythema.   Lab Results:  Lab Results  Component Value Date   WBC 6.8 11/27/2013   HGB 12.6 11/27/2013   HCT 38.6 11/27/2013   MCV 92.0 11/27/2013   PLT 153 11/27/2013   NEUTROABS 5.1 11/27/2013    Imaging:  No results found.  Medications: I have reviewed the patient's current medications.  Assessment/Plan: 1. Rectal cancer, clinical stage III (T3 N1) status post biopsy of a rectal mass on 05/24/2013 confirming adenocarcinoma. Endoscopic ultrasound 05/31/2013 measured the mass at 11 cm from the anal verge, uT3uN1.  Initiation of radiation and concurrent Xeloda 06/18/2013; completion 07/26/2013.  Status post low anterior resection 09/02/2013. Invasive adenocarcinoma, 3.7 cm, negative margins; metastatic carcinoma in 7 of 11 lymph nodes; extensive lymph vascular involvement by  tumor.  Cycle 1 adjuvant FOLFOX 10/15/2013. Cycle 2 adjuvant FOLFOX 10/29/2013. Cycle 3 adjuvant FOLFOX 11/14/2013. 2. Post colonoscopy bowel perforation confirmed on abdomen CT 05/28/2013. 3. Single indeterminate liver lesion on the abdomen CT 05/28/2013. MRI on 06/18/2013 confirmed 2 tiny liver lesions, indeterminate-favored to be benign cysts. 4. History of cervical and lumbar disc disease.  5. History of HELLP syndrome with first pregnancy.  6. Delayed nausea following cycle 1 FOLFOX. She receives Aloxi and Emend. She continues to experience delayed nausea. Prophylactic Decadron added with cycle 4. 7. History of pain with bowel movements. 8. Anastomotic leak on CT 09/13/2013. Status post exploratory laparotomy with drainage of a pelvic abscess, diverting loop ileostomy. Drainage catheter removed 10/23/2013 9. Perineal numbness, urinary retention. The urinary retention has improved. 10. Abdominal and rectal pain. She is taking Percocet as needed. Improved. 11. Neutropenia secondary chemotherapy-she received Neulasta beginning with cycle 2 FOLFOX. 12. Urinary tract infection, Escherichia coli-11/03/2013. 13. Venous engorgement at the left anterior chest and left arm with swelling of the left arm and hand. Venous Doppler 11/14/2013 positive for DVT. She continues Lovenox. 14. Fever 11/20/2013. Blood and urine cultures negative. The fever resolved within 24 hours. 15. Herpetic lip lesions. Appear to be healing.   Disposition: She appears stable. She has completed 3 cycles of FOLFOX. Plan to proceed with cycle 4 today as scheduled.  She continues to have delayed nausea despite Aloxi and Emend. We will add prophylactic Decadron beginning the day after chemotherapy for 3 days.  The etiology of the fever one week ago is unclear. No localizing source for infection. Blood and urine cultures negative. She will contact the office with recurrent  fever.  She will return for a followup visit and cycle  5 FOLFOX in 2 weeks. She will contact the office in the interim as outlined above or with any other problems.    Plan reviewed with Dr. Benay Spice. 25 minutes were spent face-to-face at today's visit with the majority of the time involved in counseling/coordination of care.   Ned Card ANP/GNP-BC   11/27/2013  10:17 AM

## 2013-11-27 NOTE — Telephone Encounter (Signed)
gv and printed appt sched and avs for pt for July....sed added tx. °

## 2013-11-27 NOTE — Patient Instructions (Signed)
[facility] Discharge Instructions for Patients Receiving Chemotherapy  Today you received the following chemotherapy agents: Oxaliplatin, Leucovorin, 5FU   To help prevent nausea and vomiting after your treatment, we encourage you to take your nausea medication as prescribed.    If you develop nausea and vomiting that is not controlled by your nausea medication, call the clinic.   BELOW ARE SYMPTOMS THAT SHOULD BE REPORTED IMMEDIATELY:  *FEVER GREATER THAN 100.5 F  *CHILLS WITH OR WITHOUT FEVER  NAUSEA AND VOMITING THAT IS NOT CONTROLLED WITH YOUR NAUSEA MEDICATION  *UNUSUAL SHORTNESS OF BREATH  *UNUSUAL BRUISING OR BLEEDING  TENDERNESS IN MOUTH AND THROAT WITH OR WITHOUT PRESENCE OF ULCERS  *URINARY PROBLEMS  *BOWEL PROBLEMS  UNUSUAL RASH Items with * indicate a potential emergency and should be followed up as soon as possible.  Feel free to call the clinic you have any questions or concerns. The clinic phone number is [phone].    

## 2013-11-29 ENCOUNTER — Ambulatory Visit (HOSPITAL_BASED_OUTPATIENT_CLINIC_OR_DEPARTMENT_OTHER): Payer: 59

## 2013-11-29 VITALS — BP 114/54 | HR 84 | Temp 97.0°F

## 2013-11-29 DIAGNOSIS — C20 Malignant neoplasm of rectum: Secondary | ICD-10-CM

## 2013-11-29 DIAGNOSIS — D702 Other drug-induced agranulocytosis: Secondary | ICD-10-CM

## 2013-11-29 MED ORDER — HEPARIN SOD (PORK) LOCK FLUSH 100 UNIT/ML IV SOLN
500.0000 [IU] | Freq: Once | INTRAVENOUS | Status: AC | PRN
  Administered 2013-11-29: 500 [IU]
  Filled 2013-11-29: qty 5

## 2013-11-29 MED ORDER — SODIUM CHLORIDE 0.9 % IJ SOLN
10.0000 mL | INTRAMUSCULAR | Status: DC | PRN
  Administered 2013-11-29: 10 mL
  Filled 2013-11-29: qty 10

## 2013-11-29 MED ORDER — PEGFILGRASTIM INJECTION 6 MG/0.6ML
6.0000 mg | Freq: Once | SUBCUTANEOUS | Status: AC
  Administered 2013-11-29: 6 mg via SUBCUTANEOUS
  Filled 2013-11-29: qty 0.6

## 2013-11-29 NOTE — Patient Instructions (Signed)
Pegfilgrastim injection (Neulasta) What is this medicine? PEGFILGRASTIM (peg fil GRA stim) helps the body make more white blood cells. It is used to prevent infection in people with low amounts of white blood cells following cancer treatment. This medicine may be used for other purposes; ask your health care provider or pharmacist if you have questions. COMMON BRAND NAME(S): Neulasta What should I tell my health care provider before I take this medicine? They need to know if you have any of these conditions: -sickle cell disease -an unusual or allergic reaction to pegfilgrastim, filgrastim, E.coli protein, other medicines, foods, dyes, or preservatives -pregnant or trying to get pregnant -breast-feeding How should I use this medicine? This medicine is for injection under the skin. It is usually given by a health care professional in a hospital or clinic setting. If you get this medicine at home, you will be taught how to prepare and give this medicine. Do not shake this medicine. Use exactly as directed. Take your medicine at regular intervals. Do not take your medicine more often than directed. It is important that you put your used needles and syringes in a special sharps container. Do not put them in a trash can. If you do not have a sharps container, call your pharmacist or healthcare provider to get one. Talk to your pediatrician regarding the use of this medicine in children. While this drug may be prescribed for children who weigh more than 45 kg for selected conditions, precautions do apply Overdosage: If you think you have taken too much of this medicine contact a poison control center or emergency room at once. NOTE: This medicine is only for you. Do not share this medicine with others. What if I miss a dose? If you miss a dose, take it as soon as you can. If it is almost time for your next dose, take only that dose. Do not take double or extra doses. What may interact with this  medicine? -lithium -medicines for growth therapy This list may not describe all possible interactions. Give your health care provider a list of all the medicines, herbs, non-prescription drugs, or dietary supplements you use. Also tell them if you smoke, drink alcohol, or use illegal drugs. Some items may interact with your medicine. What should I watch for while using this medicine? Visit your doctor for regular check ups. You will need important blood work done while you are taking this medicine. What side effects may I notice from receiving this medicine? Side effects that you should report to your doctor or health care professional as soon as possible: -allergic reactions like skin rash, itching or hives, swelling of the face, lips, or tongue -breathing problems -fever -pain, redness, or swelling where injected -shoulder pain -stomach or side pain Side effects that usually do not require medical attention (report to your doctor or health care professional if they continue or are bothersome): -aches, pains -headache -loss of appetite -nausea, vomiting -unusually tired This list may not describe all possible side effects. Call your doctor for medical advice about side effects. You may report side effects to FDA at 1-800-FDA-1088. Where should I keep my medicine? Keep out of the reach of children. Store in a refrigerator between 2 and 8 degrees C (36 and 46 degrees F). Do not freeze. Keep in carton to protect from light. Throw away this medicine if it is left out of the refrigerator for more than 48 hours. Throw away any unused medicine after the expiration date. NOTE: This sheet  is a summary. It may not cover all possible information. If you have questions about this medicine, talk to your doctor, pharmacist, or health care provider.  2014, [employer]vier/Gold Standard. (2008-01-07 15:41:44)

## 2013-12-08 ENCOUNTER — Other Ambulatory Visit: Payer: Self-pay | Admitting: Oncology

## 2013-12-10 ENCOUNTER — Telehealth: Payer: Self-pay | Admitting: Oncology

## 2013-12-10 ENCOUNTER — Ambulatory Visit (HOSPITAL_BASED_OUTPATIENT_CLINIC_OR_DEPARTMENT_OTHER): Payer: 59 | Admitting: Nurse Practitioner

## 2013-12-10 ENCOUNTER — Encounter: Payer: Self-pay | Admitting: Oncology

## 2013-12-10 ENCOUNTER — Encounter: Payer: Self-pay | Admitting: *Deleted

## 2013-12-10 ENCOUNTER — Other Ambulatory Visit (HOSPITAL_BASED_OUTPATIENT_CLINIC_OR_DEPARTMENT_OTHER): Payer: 59

## 2013-12-10 ENCOUNTER — Ambulatory Visit (HOSPITAL_BASED_OUTPATIENT_CLINIC_OR_DEPARTMENT_OTHER): Payer: 59

## 2013-12-10 VITALS — BP 117/69 | HR 86 | Temp 98.0°F | Resp 18 | Ht 64.0 in | Wt 138.3 lb

## 2013-12-10 DIAGNOSIS — R110 Nausea: Secondary | ICD-10-CM

## 2013-12-10 DIAGNOSIS — R109 Unspecified abdominal pain: Secondary | ICD-10-CM

## 2013-12-10 DIAGNOSIS — I82409 Acute embolism and thrombosis of unspecified deep veins of unspecified lower extremity: Secondary | ICD-10-CM

## 2013-12-10 DIAGNOSIS — K6289 Other specified diseases of anus and rectum: Secondary | ICD-10-CM

## 2013-12-10 DIAGNOSIS — C20 Malignant neoplasm of rectum: Secondary | ICD-10-CM

## 2013-12-10 DIAGNOSIS — C779 Secondary and unspecified malignant neoplasm of lymph node, unspecified: Secondary | ICD-10-CM

## 2013-12-10 DIAGNOSIS — B009 Herpesviral infection, unspecified: Secondary | ICD-10-CM

## 2013-12-10 DIAGNOSIS — D6959 Other secondary thrombocytopenia: Secondary | ICD-10-CM

## 2013-12-10 DIAGNOSIS — D702 Other drug-induced agranulocytosis: Secondary | ICD-10-CM

## 2013-12-10 DIAGNOSIS — I82402 Acute embolism and thrombosis of unspecified deep veins of left lower extremity: Secondary | ICD-10-CM

## 2013-12-10 DIAGNOSIS — Z5111 Encounter for antineoplastic chemotherapy: Secondary | ICD-10-CM

## 2013-12-10 LAB — CBC WITH DIFFERENTIAL/PLATELET
BASO%: 1.4 % (ref 0.0–2.0)
BASOS ABS: 0.1 10*3/uL (ref 0.0–0.1)
EOS%: 2.2 % (ref 0.0–7.0)
Eosinophils Absolute: 0.2 10*3/uL (ref 0.0–0.5)
HEMATOCRIT: 40.2 % (ref 34.8–46.6)
HGB: 13.1 g/dL (ref 11.6–15.9)
LYMPH#: 0.9 10*3/uL (ref 0.9–3.3)
LYMPH%: 11 % — ABNORMAL LOW (ref 14.0–49.7)
MCH: 30.3 pg (ref 25.1–34.0)
MCHC: 32.6 g/dL (ref 31.5–36.0)
MCV: 92.8 fL (ref 79.5–101.0)
MONO#: 0.4 10*3/uL (ref 0.1–0.9)
MONO%: 5.2 % (ref 0.0–14.0)
NEUT#: 6.4 10*3/uL (ref 1.5–6.5)
NEUT%: 80.2 % — AB (ref 38.4–76.8)
PLATELETS: 91 10*3/uL — AB (ref 145–400)
RBC: 4.33 10*6/uL (ref 3.70–5.45)
RDW: 17.8 % — ABNORMAL HIGH (ref 11.2–14.5)
WBC: 8 10*3/uL (ref 3.9–10.3)

## 2013-12-10 LAB — COMPREHENSIVE METABOLIC PANEL (CC13)
ALBUMIN: 3.8 g/dL (ref 3.5–5.0)
ALK PHOS: 126 U/L (ref 40–150)
ALT: 51 U/L (ref 0–55)
AST: 25 U/L (ref 5–34)
Anion Gap: 9 mEq/L (ref 3–11)
BILIRUBIN TOTAL: 0.3 mg/dL (ref 0.20–1.20)
BUN: 12.8 mg/dL (ref 7.0–26.0)
CO2: 22 mEq/L (ref 22–29)
Calcium: 9.7 mg/dL (ref 8.4–10.4)
Chloride: 108 mEq/L (ref 98–109)
Creatinine: 0.9 mg/dL (ref 0.6–1.1)
GLUCOSE: 88 mg/dL (ref 70–140)
Potassium: 3.8 mEq/L (ref 3.5–5.1)
Sodium: 140 mEq/L (ref 136–145)
Total Protein: 7.2 g/dL (ref 6.4–8.3)

## 2013-12-10 MED ORDER — DEXAMETHASONE SODIUM PHOSPHATE 10 MG/ML IJ SOLN
10.0000 mg | Freq: Once | INTRAMUSCULAR | Status: AC
  Administered 2013-12-10: 10 mg via INTRAVENOUS

## 2013-12-10 MED ORDER — PALONOSETRON HCL INJECTION 0.25 MG/5ML
INTRAVENOUS | Status: AC
  Filled 2013-12-10: qty 5

## 2013-12-10 MED ORDER — SODIUM CHLORIDE 0.9 % IV SOLN
2400.0000 mg/m2 | INTRAVENOUS | Status: DC
  Administered 2013-12-10: 4050 mg via INTRAVENOUS
  Filled 2013-12-10: qty 81

## 2013-12-10 MED ORDER — OXYCODONE-ACETAMINOPHEN 5-325 MG PO TABS: 1.0000 | ORAL_TABLET | Freq: Three times a day (TID) | ORAL | Status: DC | PRN

## 2013-12-10 MED ORDER — OXALIPLATIN CHEMO INJECTION 100 MG/20ML
85.0000 mg/m2 | Freq: Once | INTRAVENOUS | Status: AC
  Administered 2013-12-10: 145 mg via INTRAVENOUS
  Filled 2013-12-10: qty 29

## 2013-12-10 MED ORDER — ACYCLOVIR 400 MG PO TABS: 400.0000 mg | ORAL_TABLET | Freq: Two times a day (BID) | ORAL | Status: DC

## 2013-12-10 MED ORDER — SODIUM CHLORIDE 0.9 % IV SOLN
150.0000 mg | Freq: Once | INTRAVENOUS | Status: AC
  Administered 2013-12-10: 150 mg via INTRAVENOUS
  Filled 2013-12-10: qty 5

## 2013-12-10 MED ORDER — FLUOROURACIL CHEMO INJECTION 2.5 GM/50ML
400.0000 mg/m2 | Freq: Once | INTRAVENOUS | Status: AC
  Administered 2013-12-10: 700 mg via INTRAVENOUS
  Filled 2013-12-10: qty 14

## 2013-12-10 MED ORDER — PALONOSETRON HCL INJECTION 0.25 MG/5ML
0.2500 mg | Freq: Once | INTRAVENOUS | Status: AC
  Administered 2013-12-10: 0.25 mg via INTRAVENOUS

## 2013-12-10 MED ORDER — RIVAROXABAN 20 MG PO TABS: 20.0000 mg | ORAL_TABLET | Freq: Every day | ORAL | Status: DC

## 2013-12-10 MED ORDER — DEXTROSE 5 % IV SOLN
Freq: Once | INTRAVENOUS | Status: AC
  Administered 2013-12-10: 11:00:00 via INTRAVENOUS

## 2013-12-10 MED ORDER — DEXAMETHASONE SODIUM PHOSPHATE 10 MG/ML IJ SOLN
INTRAMUSCULAR | Status: AC
  Filled 2013-12-10: qty 1

## 2013-12-10 MED ORDER — LEUCOVORIN CALCIUM INJECTION 350 MG
414.0000 mg/m2 | Freq: Once | INTRAMUSCULAR | Status: AC
  Administered 2013-12-10: 700 mg via INTRAVENOUS
  Filled 2013-12-10: qty 35

## 2013-12-10 NOTE — Progress Notes (Signed)
RECEIVED A FAX FROM [facility] CONCERNING A PRIOR AUTHORIZATION FOR XARELTO. THIS REQUEST WAS PLACED IN THE MANAGED CARE BIN.

## 2013-12-10 NOTE — Patient Instructions (Signed)
[name] Discharge Instructions for Patients Receiving Chemotherapy  Today you received the following chemotherapy agents: Oxaliplatin, 5FU, Leucovorin. To help prevent nausea and vomiting after your treatment, we encourage you to take your nausea medication.   If you develop nausea and vomiting that is not controlled by your nausea medication, call the clinic.   BELOW ARE SYMPTOMS THAT SHOULD BE REPORTED IMMEDIATELY:  *FEVER GREATER THAN 100.5 F  *CHILLS WITH OR WITHOUT FEVER  NAUSEA AND VOMITING THAT IS NOT CONTROLLED WITH YOUR NAUSEA MEDICATION  *UNUSUAL SHORTNESS OF BREATH  *UNUSUAL BRUISING OR BLEEDING  TENDERNESS IN MOUTH AND THROAT WITH OR WITHOUT PRESENCE OF ULCERS  *URINARY PROBLEMS  *BOWEL PROBLEMS  UNUSUAL RASH Items with * indicate a potential emergency and should be followed up as soon as possible.  Feel free to call the clinic you have any questions or concerns. The clinic phone number is [phone].

## 2013-12-10 NOTE — Progress Notes (Signed)
[facility] OFFICE PROGRESS NOTE   Diagnosis:  Rectal cancer.  INTERVAL HISTORY:   [name] returns as scheduled. She completed cycle 4 FOLFOX on 11/27/2013. She had less nausea than with previous cycles. No mouth sores. She has continued watery output from the ostomy. Cold sensitivity lasted about 6 days. No persistent neuropathy symptoms. She reports 2 separate episodes of abdominal pain following the last 2 cycles of chemotherapy. The pain lasted about 10 minutes and then resolved. She noted a low-grade fever following the most recent cycle. She has noted a few small "blood blisters", one located on the left arm and the other on her nose. No epistaxis, gingival bleeding, hematuria or bleeding from the ostomy. She has a persistent herpetic lip lesion.  Objective:  Vital signs in last 24 hours:  Blood pressure 117/69, pulse 86, temperature 98 F (36.7 C), temperature source Oral, resp. rate 18, height 5\' 4"  (1.626 m), weight 138 lb 4.8 oz (62.732 kg), SpO2 99.00%.    HEENT: Lower lip with a herpetic appearing lesion. No thrush or ulcerations. Resp: Lungs clear. Cardio: Regular cardiac rhythm. GI: Abdomen soft and nontender. No hepatomegaly. Right abdomen ileostomy. Liquid stool in the collection bag. Vascular: No leg edema. Edema and venous engorgement left upper chest/upper arm.   Port-A-Cath site without erythema.    Lab Results:  Lab Results  Component Value Date   WBC 8.0 12/10/2013   HGB 13.1 12/10/2013   HCT 40.2 12/10/2013   MCV 92.8 12/10/2013   PLT 91* 12/10/2013   NEUTROABS 6.4 12/10/2013    Imaging:  No results found.  Medications: I have reviewed the patient's current medications.  Assessment/Plan: 1. Rectal cancer, clinical stage III (T3 N1) status post biopsy of a rectal mass on 05/24/2013 confirming adenocarcinoma. Endoscopic ultrasound 05/31/2013 measured the mass at 11 cm from the anal verge, uT3uN1.  Initiation of radiation and  concurrent Xeloda 06/18/2013; completion 07/26/2013.  Status post low anterior resection 09/02/2013. Invasive adenocarcinoma, 3.7 cm, negative margins; metastatic carcinoma in 7 of 11 lymph nodes; extensive lymph vascular involvement by tumor.  Cycle 1 adjuvant FOLFOX 10/15/2013.  Cycle 2 adjuvant FOLFOX 10/29/2013.  Cycle 3 adjuvant FOLFOX 11/14/2013. Cycle 4 adjuvant FOLFOX 11/27/2013. 2. Post colonoscopy bowel perforation confirmed on abdomen CT 05/28/2013. 3. Single indeterminate liver lesion on the abdomen CT 05/28/2013. MRI on 06/18/2013 confirmed 2 tiny liver lesions, indeterminate-favored to be benign cysts. 4. History of cervical and lumbar disc disease.  5. History of HELLP syndrome with first pregnancy.  6. Delayed nausea following cycle 1 FOLFOX. She receives Aloxi and Emend. She continues to experience delayed nausea. Prophylactic Decadron added with cycle 4. 7. History of pain with bowel movements. 8. Anastomotic leak on CT 09/13/2013. Status post exploratory laparotomy with drainage of a pelvic abscess, diverting loop ileostomy. Drainage catheter removed 10/23/2013. 9. Perineal numbness, urinary retention. The urinary retention has improved. 10. Abdominal and rectal pain. She is taking Percocet as needed. Improved. 11. Neutropenia secondary chemotherapy-she received Neulasta beginning with cycle 2 FOLFOX. 12. Urinary tract infection, Escherichia coli-11/03/2013. 13. Venous engorgement at the left anterior chest and left arm with swelling of the left arm and hand. Venous Doppler 11/14/2013 positive for DVT. She began Lovenox 11/14/2013 and was switched to xarelto 12/10/2013 14. Fever 11/20/2013. Blood and urine cultures negative. The fever resolved within 24 hours. 15. Herpetic lip lesion. She will begin acyclovir 400 mg twice daily. 16. 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   Disposition: [name] has completed 4 cycles  of adjuvant FOLFOX. Plan to proceed with cycle 5  today as scheduled.  The platelet count is mildly decreased. She understands to contact the office with any bleeding.  At present she is on Lovenox for the left upper extremity DVT. She would like to switch to an oral anticoagulant. She will begin Xarelto 20 mg daily. She understands there is no reversal agent for Xarelto.   For the persistent herpetic appearing lip lesion she will begin acyclovir 400 mg twice daily.  The etiology of the abdominal pain following the last 2 cycles of chemotherapy is unclear. She understands to contact the office if the pain persists.  She will return for a followup visit and cycle 6 FOLFOX in 2 weeks. She will contact the office in the interim as outlined above or with any other problems.  Patient seen with Dr. Benay Spice. 30 minutes were spent face-to-face at today's visit that the majority of that time involved in counseling/coordination of care.    Ned Card ANP/GNP-BC   12/10/2013  1:23 PM   This was a shared visit with Ned Card. She has completed 4 cycles of adjuvant FOLFOX and will receive cycle 5 today. She would like to switch to an oral anticoagulant for treatment of the left upper extremity DVT. She will be switched to Xarelto. We discussed the bleeding risk associated with this agent and she agrees to proceed.  The plan is to complete 9 cycles of adjuvant therapy prior to reversing the ileostomy.  [name], M.D.

## 2013-12-10 NOTE — Progress Notes (Signed)
[facility], [id number], approved xarelto from 11/10/13-12/10/14

## 2013-12-10 NOTE — Progress Notes (Signed)
OK to treat despite platelets per Lattie Haw, NP.

## 2013-12-10 NOTE — Telephone Encounter (Signed)
gv adn printed appt sched and avs for pt for June adn July...sed added tx. °

## 2013-12-12 ENCOUNTER — Ambulatory Visit: Payer: Self-pay

## 2013-12-12 ENCOUNTER — Ambulatory Visit (HOSPITAL_BASED_OUTPATIENT_CLINIC_OR_DEPARTMENT_OTHER): Payer: 59

## 2013-12-12 VITALS — BP 105/64 | HR 80 | Temp 97.3°F | Resp 16

## 2013-12-12 DIAGNOSIS — D702 Other drug-induced agranulocytosis: Secondary | ICD-10-CM

## 2013-12-12 DIAGNOSIS — C20 Malignant neoplasm of rectum: Secondary | ICD-10-CM

## 2013-12-12 MED ORDER — HEPARIN SOD (PORK) LOCK FLUSH 100 UNIT/ML IV SOLN
500.0000 [IU] | Freq: Once | INTRAVENOUS | Status: AC | PRN
  Administered 2013-12-12: 500 [IU]
  Filled 2013-12-12: qty 5

## 2013-12-12 MED ORDER — SODIUM CHLORIDE 0.9 % IJ SOLN
10.0000 mL | INTRAMUSCULAR | Status: DC | PRN
  Administered 2013-12-12: 10 mL
  Filled 2013-12-12: qty 10

## 2013-12-12 MED ORDER — PEGFILGRASTIM INJECTION 6 MG/0.6ML
6.0000 mg | Freq: Once | SUBCUTANEOUS | Status: AC
  Administered 2013-12-12: 6 mg via SUBCUTANEOUS
  Filled 2013-12-12: qty 0.6

## 2013-12-21 ENCOUNTER — Other Ambulatory Visit: Payer: Self-pay | Admitting: Oncology

## 2013-12-24 ENCOUNTER — Other Ambulatory Visit: Payer: Self-pay | Admitting: Nurse Practitioner

## 2013-12-24 ENCOUNTER — Ambulatory Visit (HOSPITAL_BASED_OUTPATIENT_CLINIC_OR_DEPARTMENT_OTHER): Payer: 59

## 2013-12-24 ENCOUNTER — Ambulatory Visit (HOSPITAL_BASED_OUTPATIENT_CLINIC_OR_DEPARTMENT_OTHER): Payer: 59 | Admitting: Oncology

## 2013-12-24 ENCOUNTER — Other Ambulatory Visit (HOSPITAL_BASED_OUTPATIENT_CLINIC_OR_DEPARTMENT_OTHER): Payer: 59

## 2013-12-24 ENCOUNTER — Telehealth: Payer: Self-pay | Admitting: *Deleted

## 2013-12-24 ENCOUNTER — Telehealth: Payer: Self-pay | Admitting: Oncology

## 2013-12-24 VITALS — BP 106/72 | HR 86 | Temp 98.1°F | Resp 18 | Ht 64.0 in | Wt 141.4 lb

## 2013-12-24 DIAGNOSIS — C20 Malignant neoplasm of rectum: Secondary | ICD-10-CM

## 2013-12-24 DIAGNOSIS — R339 Retention of urine, unspecified: Secondary | ICD-10-CM

## 2013-12-24 DIAGNOSIS — C779 Secondary and unspecified malignant neoplasm of lymph node, unspecified: Secondary | ICD-10-CM

## 2013-12-24 DIAGNOSIS — Z5111 Encounter for antineoplastic chemotherapy: Secondary | ICD-10-CM

## 2013-12-24 DIAGNOSIS — B009 Herpesviral infection, unspecified: Secondary | ICD-10-CM

## 2013-12-24 DIAGNOSIS — D702 Other drug-induced agranulocytosis: Secondary | ICD-10-CM

## 2013-12-24 DIAGNOSIS — R112 Nausea with vomiting, unspecified: Secondary | ICD-10-CM

## 2013-12-24 DIAGNOSIS — D619 Aplastic anemia, unspecified: Secondary | ICD-10-CM

## 2013-12-24 DIAGNOSIS — I10 Essential (primary) hypertension: Secondary | ICD-10-CM

## 2013-12-24 LAB — CBC WITH DIFFERENTIAL/PLATELET
BASO%: 0.8 % (ref 0.0–2.0)
BASOS ABS: 0.1 10*3/uL (ref 0.0–0.1)
EOS%: 0.9 % (ref 0.0–7.0)
Eosinophils Absolute: 0.1 10*3/uL (ref 0.0–0.5)
HCT: 38.6 % (ref 34.8–46.6)
HEMOGLOBIN: 13 g/dL (ref 11.6–15.9)
LYMPH%: 7.3 % — AB (ref 14.0–49.7)
MCH: 31.2 pg (ref 25.1–34.0)
MCHC: 33.6 g/dL (ref 31.5–36.0)
MCV: 92.9 fL (ref 79.5–101.0)
MONO#: 0.5 10*3/uL (ref 0.1–0.9)
MONO%: 4.7 % (ref 0.0–14.0)
NEUT#: 10.1 10*3/uL — ABNORMAL HIGH (ref 1.5–6.5)
NEUT%: 86.3 % — ABNORMAL HIGH (ref 38.4–76.8)
Platelets: 101 10*3/uL — ABNORMAL LOW (ref 145–400)
RBC: 4.16 10*6/uL (ref 3.70–5.45)
RDW: 18.5 % — ABNORMAL HIGH (ref 11.2–14.5)
WBC: 11.7 10*3/uL — ABNORMAL HIGH (ref 3.9–10.3)
lymph#: 0.9 10*3/uL (ref 0.9–3.3)

## 2013-12-24 LAB — COMPREHENSIVE METABOLIC PANEL (CC13)
ALK PHOS: 129 U/L (ref 40–150)
ALT: 31 U/L (ref 0–55)
AST: 21 U/L (ref 5–34)
Albumin: 3.5 g/dL (ref 3.5–5.0)
Anion Gap: 9 mEq/L (ref 3–11)
BUN: 12.8 mg/dL (ref 7.0–26.0)
CALCIUM: 9.5 mg/dL (ref 8.4–10.4)
CO2: 21 mEq/L — ABNORMAL LOW (ref 22–29)
Chloride: 110 mEq/L — ABNORMAL HIGH (ref 98–109)
Creatinine: 0.9 mg/dL (ref 0.6–1.1)
Glucose: 99 mg/dl (ref 70–140)
POTASSIUM: 4 meq/L (ref 3.5–5.1)
Sodium: 140 mEq/L (ref 136–145)
TOTAL PROTEIN: 7.1 g/dL (ref 6.4–8.3)
Total Bilirubin: 0.28 mg/dL (ref 0.20–1.20)

## 2013-12-24 MED ORDER — PALONOSETRON HCL INJECTION 0.25 MG/5ML
0.2500 mg | Freq: Once | INTRAVENOUS | Status: AC
  Administered 2013-12-24: 0.25 mg via INTRAVENOUS

## 2013-12-24 MED ORDER — SODIUM CHLORIDE 0.9 % IV SOLN
150.0000 mg | Freq: Once | INTRAVENOUS | Status: AC
  Administered 2013-12-24: 150 mg via INTRAVENOUS
  Filled 2013-12-24: qty 5

## 2013-12-24 MED ORDER — SODIUM CHLORIDE 0.9 % IV SOLN
2400.0000 mg/m2 | INTRAVENOUS | Status: DC
  Administered 2013-12-24: 4050 mg via INTRAVENOUS
  Filled 2013-12-24: qty 81

## 2013-12-24 MED ORDER — DEXAMETHASONE SODIUM PHOSPHATE 10 MG/ML IJ SOLN
10.0000 mg | Freq: Once | INTRAMUSCULAR | Status: AC
  Administered 2013-12-24: 10 mg via INTRAVENOUS

## 2013-12-24 MED ORDER — DEXAMETHASONE SODIUM PHOSPHATE 10 MG/ML IJ SOLN
INTRAMUSCULAR | Status: AC
  Filled 2013-12-24: qty 1

## 2013-12-24 MED ORDER — LEUCOVORIN CALCIUM INJECTION 350 MG
414.0000 mg/m2 | Freq: Once | INTRAVENOUS | Status: AC
  Administered 2013-12-24: 700 mg via INTRAVENOUS
  Filled 2013-12-24: qty 35

## 2013-12-24 MED ORDER — PALONOSETRON HCL INJECTION 0.25 MG/5ML
INTRAVENOUS | Status: AC
  Filled 2013-12-24: qty 5

## 2013-12-24 MED ORDER — FLUOROURACIL CHEMO INJECTION 2.5 GM/50ML
400.0000 mg/m2 | Freq: Once | INTRAVENOUS | Status: AC
  Administered 2013-12-24: 700 mg via INTRAVENOUS
  Filled 2013-12-24: qty 14

## 2013-12-24 MED ORDER — DEXTROSE 5 % IV SOLN
Freq: Once | INTRAVENOUS | Status: AC
  Administered 2013-12-24: 11:00:00 via INTRAVENOUS

## 2013-12-24 MED ORDER — OXALIPLATIN CHEMO INJECTION 100 MG/20ML
85.0000 mg/m2 | Freq: Once | INTRAVENOUS | Status: AC
  Administered 2013-12-24: 145 mg via INTRAVENOUS
  Filled 2013-12-24: qty 29

## 2013-12-24 NOTE — Progress Notes (Signed)
  [facility] OFFICE PROGRESS NOTE   Diagnosis: rectal cancer  INTERVAL HISTORY:   She completed cycle of FOLFOX beginning 12/10/2013.she reports nausea for approximately 5 days following chemotherapy, no emesis. She continues to have cold sensitivity. No peripheral numbness. Stable perineal soreness and numbness. She has difficulty sensing went to urinate, but she is able to urinate without self-catheterization. The left arm swelling persists.she had a single episode of fever approximately one week following chemotherapy.  Objective:  Vital signs in last 24 hours:  Blood pressure 106/72, pulse 86, temperature 98.1 F (36.7 C), temperature source Oral, resp. rate 18, height 5\' 4"  (1.626 m), weight 141 lb 6.4 oz (64.139 kg).    HEENT: no thrush or ulcers Resp: lungs clear bilaterally Cardio: regular rate and rhythm GI: no hepatomegaly, nontender, right abdomen ileostomy Vascular: no leg edema,  Mild edema of the left upper arm with venous engorgement at the left upper chest and on Neuro:the vibratory sense is intact at the fingertips bilaterally    Portacath/PICC-without erythema  Lab Results:  Lab Results  Component Value Date   WBC 11.7* 12/24/2013   HGB 13.0 12/24/2013   HCT 38.6 12/24/2013   MCV 92.9 12/24/2013   PLT 101* 12/24/2013   NEUTROABS 10.1* 12/24/2013     Medications: I have reviewed the patient's current medications.  Assessment/Plan: 1. Rectal cancer, clinical stage III (T3 N1) status post biopsy of a rectal mass on 05/24/2013 confirming adenocarcinoma. Endoscopic ultrasound 05/31/2013 measured the mass at 11 cm from the anal verge, uT3uN1.  Initiation of radiation and concurrent Xeloda 06/18/2013; completion 07/26/2013.  Status post low anterior resection 09/02/2013. Invasive adenocarcinoma, 3.7 cm, negative margins; metastatic carcinoma in 7 of 11 lymph nodes; extensive lymph vascular involvement by tumor.  Cycle 1 adjuvant FOLFOX 10/15/2013.  Cycle 2  adjuvant FOLFOX 10/29/2013.  Cycle 3 adjuvant FOLFOX 11/14/2013.  Cycle 4 adjuvant FOLFOX 11/27/2013. Cycle 5 adjuvant FOLFOX 12/10/2013 2. Post colonoscopy bowel perforation confirmed on abdomen CT 05/28/2013. 3. Single indeterminate liver lesion on the abdomen CT 05/28/2013. MRI on 06/18/2013 confirmed 2 tiny liver lesions, indeterminate-favored to be benign cysts. 4. History of cervical and lumbar disc disease.  5. History of HELLP syndrome with first pregnancy.  6. Delayed nausea following cycle 1 FOLFOX. She receives Aloxi and Emend. She continues to experience delayed nausea. Prophylactic Decadron added with cycle 4. 7. History of pain with bowel movements. 8. Anastomotic leak on CT 09/13/2013. Status post exploratory laparotomy with drainage of a pelvic abscess, diverting loop ileostomy. Drainage catheter removed 10/23/2013. 9. Perineal numbness, urinary retention. The urinary retention has improved. 10. Perineal pain-improved 11. Neutropenia secondary chemotherapy-she received Neulasta beginning with cycle 2 FOLFOX. 12. Urinary tract infection, Escherichia coli-11/03/2013. 13. Venous engorgement at the left anterior chest and left arm with swelling of the left arm and hand. Venous Doppler 11/14/2013 positive for DVT. She began Lovenox 11/14/2013 and was switched to xarelto 12/10/2013 14. Fever 11/20/2013. Blood and urine cultures negative. The fever resolved within 24 hours. 15. History of a herpetic lip lesion, now taking acyclovir 16. 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   Disposition:  She appears stable. The plan is to proceed with cycle 6 of adjuvant FOLFOX today. Ms. Broberg will return for an office visit and chemotherapy in 2 weeks. She will not receive Neulasta with this cycle.  Betsy Coder, MD  12/24/2013  10:09 AM

## 2013-12-24 NOTE — Telephone Encounter (Signed)
, °

## 2013-12-24 NOTE — Telephone Encounter (Signed)
Per staff message and POF I have scheduled appts. Advised scheduler of appts. JMW  

## 2013-12-24 NOTE — Patient Instructions (Signed)
[name] Discharge Instructions for Patients Receiving Chemotherapy  Today you received the following chemotherapy agents :  Oxaliplatin, Leucovorin, 5 FU.  To help prevent nausea and vomiting after your treatment, we encourage you to take your nausea medication as prescribed by your physician.   If you develop nausea and vomiting that is not controlled by your nausea medication, call the clinic.   BELOW ARE SYMPTOMS THAT SHOULD BE REPORTED IMMEDIATELY:  *FEVER GREATER THAN 100.5 F  *CHILLS WITH OR WITHOUT FEVER  NAUSEA AND VOMITING THAT IS NOT CONTROLLED WITH YOUR NAUSEA MEDICATION  *UNUSUAL SHORTNESS OF BREATH  *UNUSUAL BRUISING OR BLEEDING  TENDERNESS IN MOUTH AND THROAT WITH OR WITHOUT PRESENCE OF ULCERS  *URINARY PROBLEMS  *BOWEL PROBLEMS  UNUSUAL RASH Items with * indicate a potential emergency and should be followed up as soon as possible.  Feel free to call the clinic you have any questions or concerns. The clinic phone number is [phone].

## 2013-12-26 ENCOUNTER — Ambulatory Visit (HOSPITAL_BASED_OUTPATIENT_CLINIC_OR_DEPARTMENT_OTHER): Payer: 59

## 2013-12-26 ENCOUNTER — Ambulatory Visit: Payer: Self-pay

## 2013-12-26 VITALS — BP 108/66 | HR 74 | Temp 98.2°F

## 2013-12-26 DIAGNOSIS — C779 Secondary and unspecified malignant neoplasm of lymph node, unspecified: Secondary | ICD-10-CM

## 2013-12-26 DIAGNOSIS — C20 Malignant neoplasm of rectum: Secondary | ICD-10-CM

## 2013-12-26 MED ORDER — SODIUM CHLORIDE 0.9 % IJ SOLN
10.0000 mL | INTRAMUSCULAR | Status: DC | PRN
Start: 2013-12-26 — End: 2013-12-26
  Administered 2013-12-26: 10 mL
  Filled 2013-12-26: qty 10

## 2013-12-26 MED ORDER — HEPARIN SOD (PORK) LOCK FLUSH 100 UNIT/ML IV SOLN
500.0000 [IU] | Freq: Once | INTRAVENOUS | Status: AC | PRN
  Administered 2013-12-26: 500 [IU]
  Filled 2013-12-26: qty 5

## 2014-01-05 ENCOUNTER — Other Ambulatory Visit: Payer: Self-pay | Admitting: Oncology

## 2014-01-07 ENCOUNTER — Ambulatory Visit (HOSPITAL_BASED_OUTPATIENT_CLINIC_OR_DEPARTMENT_OTHER): Payer: 59 | Admitting: Oncology

## 2014-01-07 ENCOUNTER — Other Ambulatory Visit (HOSPITAL_BASED_OUTPATIENT_CLINIC_OR_DEPARTMENT_OTHER): Payer: 59

## 2014-01-07 ENCOUNTER — Inpatient Hospital Stay: Payer: Self-pay

## 2014-01-07 VITALS — BP 118/68 | HR 104 | Temp 98.9°F | Resp 18 | Ht 64.0 in | Wt 135.9 lb

## 2014-01-07 DIAGNOSIS — C20 Malignant neoplasm of rectum: Secondary | ICD-10-CM

## 2014-01-07 DIAGNOSIS — I82409 Acute embolism and thrombosis of unspecified deep veins of unspecified lower extremity: Secondary | ICD-10-CM

## 2014-01-07 DIAGNOSIS — D6959 Other secondary thrombocytopenia: Secondary | ICD-10-CM

## 2014-01-07 DIAGNOSIS — D702 Other drug-induced agranulocytosis: Secondary | ICD-10-CM

## 2014-01-07 DIAGNOSIS — C779 Secondary and unspecified malignant neoplasm of lymph node, unspecified: Secondary | ICD-10-CM

## 2014-01-07 LAB — COMPREHENSIVE METABOLIC PANEL (CC13)
ALK PHOS: 96 U/L (ref 40–150)
ALT: 29 U/L (ref 0–55)
ANION GAP: 10 meq/L (ref 3–11)
AST: 23 U/L (ref 5–34)
Albumin: 3.7 g/dL (ref 3.5–5.0)
BILIRUBIN TOTAL: 0.48 mg/dL (ref 0.20–1.20)
BUN: 16.5 mg/dL (ref 7.0–26.0)
CO2: 21 mEq/L — ABNORMAL LOW (ref 22–29)
CREATININE: 0.9 mg/dL (ref 0.6–1.1)
Calcium: 9.9 mg/dL (ref 8.4–10.4)
Chloride: 107 mEq/L (ref 98–109)
GLUCOSE: 101 mg/dL (ref 70–140)
Potassium: 3.8 mEq/L (ref 3.5–5.1)
Sodium: 138 mEq/L (ref 136–145)
TOTAL PROTEIN: 7.6 g/dL (ref 6.4–8.3)

## 2014-01-07 LAB — CBC WITH DIFFERENTIAL/PLATELET
BASO%: 0.7 % (ref 0.0–2.0)
Basophils Absolute: 0 10*3/uL (ref 0.0–0.1)
EOS%: 1.9 % (ref 0.0–7.0)
Eosinophils Absolute: 0 10*3/uL (ref 0.0–0.5)
HCT: 37.3 % (ref 34.8–46.6)
HGB: 12.5 g/dL (ref 11.6–15.9)
LYMPH%: 32.3 % (ref 14.0–49.7)
MCH: 30.3 pg (ref 25.1–34.0)
MCHC: 33.7 g/dL (ref 31.5–36.0)
MCV: 90.1 fL (ref 79.5–101.0)
MONO#: 0.4 10*3/uL (ref 0.1–0.9)
MONO%: 18.6 % — AB (ref 0.0–14.0)
NEUT#: 1 10*3/uL — ABNORMAL LOW (ref 1.5–6.5)
NEUT%: 46.5 % (ref 38.4–76.8)
PLATELETS: 85 10*3/uL — AB (ref 145–400)
RBC: 4.14 10*6/uL (ref 3.70–5.45)
RDW: 17.5 % — ABNORMAL HIGH (ref 11.2–14.5)
WBC: 2.1 10*3/uL — ABNORMAL LOW (ref 3.9–10.3)
lymph#: 0.7 10*3/uL — ABNORMAL LOW (ref 0.9–3.3)

## 2014-01-07 NOTE — Progress Notes (Signed)
[facility] OFFICE PROGRESS NOTE   Diagnosis: Rectal cancer  INTERVAL HISTORY:   Ms. Niznik returns as scheduled. She completed another cycle of FOLFOX on 12/24/2013. She had nausea over the week following chemotherapy. No emesis. Cold sensitivity persist. No numbness. Stable soreness in the perineum. Stable left arm edema. She has noted easy bruising. She continues to have loose stool in the ileostomy. She reports a fever on day 8 following chemotherapy. The fever was associated with chills and was measured at 101. The fever spontaneously resolved. There were no associated symptoms.  Objective:  Vital signs in last 24 hours:  Blood pressure 118/68, pulse 104, temperature 98.9 F (37.2 C), temperature source Oral, resp. rate 18, height 5\' 4"  (1.626 m), weight 135 lb 14.4 oz (61.644 kg), SpO2 98.00%.    HEENT: No thrush or ulcer Resp: Lungs clear bilaterally Cardio: Regular rate and rhythm GI: No hepatomegaly, right lower quadrant ileostomy Vascular: No leg edema, venous engorgement the left anterior chest with mild edema of the left upper arm Neuro: The vibratory sense is intact at the fingertips bilaterally  Skin: Hands without erythema.   Portacath/PICC-without erythema  Lab Results:  Lab Results  Component Value Date   WBC 2.1* 01/07/2014   HGB 12.5 01/07/2014   HCT 37.3 01/07/2014   MCV 90.1 01/07/2014   PLT 85* 01/07/2014   NEUTROABS 1.0* 01/07/2014    Medications: I have reviewed the patient's current medications.  Assessment/Plan: 1. Rectal cancer, clinical stage III (T3 N1) status post biopsy of a rectal mass on 05/24/2013 confirming adenocarcinoma. Endoscopic ultrasound 05/31/2013 measured the mass at 11 cm from the anal verge, [id number].  Initiation of radiation and concurrent Xeloda 06/18/2013; completion 07/26/2013.  Status post low anterior resection 09/02/2013. Invasive adenocarcinoma, 3.7 cm, negative margins; metastatic carcinoma in 7 of 11  lymph nodes; extensive lymph vascular involvement by tumor.  Cycle 1 adjuvant FOLFOX 10/15/2013.  Cycle 2 adjuvant FOLFOX 10/29/2013.  Cycle 3 adjuvant FOLFOX 11/14/2013.  Cycle 4 adjuvant FOLFOX 11/27/2013.  Cycle 5 adjuvant FOLFOX 12/10/2013 Cycle 6 adjuvant FOLFOX 12/24/2013 2. Post colonoscopy bowel perforation confirmed on abdomen CT 05/28/2013. 3. Single indeterminate liver lesion on the abdomen CT 05/28/2013. MRI on 06/18/2013 confirmed 2 tiny liver lesions, indeterminate-favored to be benign cysts. 4. History of cervical and lumbar disc disease.  5. History of HELLP syndrome with first pregnancy.  6. Delayed nausea following cycle 1 FOLFOX. She receives Aloxi and Emend. She continues to experience delayed nausea. Prophylactic Decadron added with cycle 4. 7. History of pain with bowel movements. 8. Anastomotic leak on CT 09/13/2013. Status post exploratory laparotomy with drainage of a pelvic abscess, diverting loop ileostomy. Drainage catheter removed 10/23/2013. 9. Perineal numbness, urinary retention. The urinary retention has improved. 10. Perineal pain-improved 11. Neutropenia secondary chemotherapy-she will receive Neulasta with the next cycle of chemotherapy 12. Urinary tract infection, Escherichia coli-11/03/2013. 13. Venous engorgement at the left anterior chest and left arm with swelling of the left arm and hand. Venous Doppler 11/14/2013 positive for DVT. She began Lovenox 11/14/2013 and was switched to xarelto 12/10/2013 14. Fever 11/20/2013. Blood and urine cultures negative. The fever resolved within 24 hours. She continues to have a single episode of fever following chemotherapy 15. History of a herpetic lip lesion, now taking acyclovir 16. 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    Disposition:  She appears stable. We decided to hold chemotherapy today secondary to neutropenia and thrombocytopenia. She did not receive Neulasta following cycle 6. She will return for  cycle 7 on 01/14/2014. Neulasta will be resumed following cycle 7.  The fever following chemotherapy is most likely drug-related. She knows to contact us for persistent fever.  She is scheduled to see Dr. [name] tomorrow to plan the ileostomy takedown. She is scheduled to complete a final cycle of adjuvant chemotherapy beginning 02/11/2014.  Betsy Coder, MD  01/07/2014  10:05 AM

## 2014-01-08 ENCOUNTER — Telehealth: Payer: Self-pay | Admitting: Oncology

## 2014-01-08 ENCOUNTER — Ambulatory Visit (INDEPENDENT_AMBULATORY_CARE_PROVIDER_SITE_OTHER): Payer: 59 | Admitting: Surgery

## 2014-01-08 ENCOUNTER — Encounter (INDEPENDENT_AMBULATORY_CARE_PROVIDER_SITE_OTHER): Payer: Self-pay | Admitting: Surgery

## 2014-01-08 VITALS — BP 122/76 | HR 98 | Temp 98.6°F | Ht 64.0 in | Wt 135.0 lb

## 2014-01-08 DIAGNOSIS — C20 Malignant neoplasm of rectum: Secondary | ICD-10-CM

## 2014-01-08 NOTE — Telephone Encounter (Signed)
lmonvm for pt re next appt for 7/28. schedule mailed. pt also mychart active.

## 2014-01-08 NOTE — Progress Notes (Signed)
[place], MD,  [address]  [place], [place]    [place] Phone:  [phone] FAX:  [phone]   Re:   [name] DOB:   06/17/1961 MRN:   [id number]  ASSESSMENT AND PLAN: 1. Laparoscopic-assisted low anterior colon resection with #29 EEA anastomosis. Appedectomy - 09/02/2013 - D. Alec Mcphee   Exploratory lap, drainage of pelvic abscess, diverting loop ileostomy, flexible sigmoidoscopy - for leak - 09/13/2013 - D. [name]   For oncology, sees Dr. Essie Christine.  She had neoadjuvant chemo/radiation therapy.   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 Margins clear.   Ileostomy functioning.    Has had 7 of 9 cycles of chemotx.  She is getting FOLFOX.  He last treatment is to end the end of August.  I will see her back in about 2 months - around the end of her chemotx to talk about reversing the ileostomy.  Questions: 1) reimaging - CT scan or BE or both?, 2) sigmoidoscopy to look at anastomosis?, 3) See MacDiarmid pre op?, 4) She has the name of the pelvic floor nurse she is going to contact.   2.  Power port - 10/11/2013 - D. Karthikeya Funke  2A.  Left arm swelling - left subclavian DVT  Started on Lovenox on 11/14/2013, Now on Xarelto since 12/10/2013  3.  DJD   Has seen Dr. Sherrian Divers  4. Anxiety  5. IUD - stuck   Sees Dr. Susette Racer  6. Indeterminate 6 mm liver lesion seen on CT scan  7. History of HELLP syndrome with first pregnancy  8. According to her husband, she does hear well without her hearing aids.  9. Urinary incontinence/retention   Dr. Chauncey Cruel. MacDiarmid's saw her 09/30/2013.   She is voiding.  Still caths herself, but gets less than 100 cc.   10. Perineal numbness. Remote history of back surgery.   Dr. Sherrian Divers has seen.   Minimal better.  Sex is worthless with the numbness/perineal changes.  HISTORY OF PRESENT ILLNESS: Chief Complaint  Patient presents with  . Follow-up   [name] is a 53 y.o. (DOB: 11/27/1960)  white  female who is a patient of NEAL,W RONALD, MD and comes to me today for follow up of LAR and rectal leak. She is accompanied with her husband.  She is holding her chemotx for now.  She is to end her chemotherapy at the end of August. Her left arm is better, though she still cannot lay or sleep on the arm. Her bladder is better, though she still has trouble.  She is not cathing herselft regularly,  When she caths herself, she gets out about 100 cc. Her perineum is about the same.  She is not sure that it is any better.  Past Medical History  Diagnosis Date  . History of radiation therapy 06/18/13-07/26/13    rectal 50.4Gy total dose  . IUD (intrauterine device) in place 08-27-13    Mirena Implant inplace  . Arthritis 08-27-13    at present has ruptured disc- lower back-not an issue now  . Colon cancer 08-27-13    radiation /chemo -last ended 4 weeks(Dr. Benay Spice)  . PONV (postoperative nausea and vomiting)     scopalamine patch helped   Current Outpatient Prescriptions  Medication Sig Dispense Refill  . acyclovir (ZOVIRAX) 400 MG tablet Take 1 tablet (400 mg total) by mouth 2 (two) times daily.  60 tablet  2  . ALPRAZolam (XANAX) 0.5 MG tablet Take 1 tablet (0.5 mg total) by mouth at bedtime as needed.  30 tablet  1  . dexamethasone (DECADRON) 4 MG tablet TAKE 1 TABLET BY MOUTH TWICE A DAY FOR 3 DAYS START DAY AFTER CHEMO  6 tablet  3  . estradiol (VIVELLE-DOT) 0.05 MG/24HR Place 1 patch onto the skin 2 (two) times a week. Sundays and Wednesday      . lidocaine-prilocaine (EMLA) cream       . ondansetron (ZOFRAN ODT) 8 MG disintegrating tablet Take 1 tablet (8 mg total) by mouth every 8 (eight) hours as needed for nausea or vomiting.  20 tablet  1  . ondansetron (ZOFRAN) 4 MG tablet Take 1 tablet (4 mg total) by mouth every 8 (eight) hours as needed for nausea or vomiting.  30 tablet  2  . oxyCODONE-acetaminophen (PERCOCET/ROXICET) 5-325 MG per  tablet Take 1-2 tablets by mouth every 8 (eight) hours as needed for moderate pain or severe pain.  50 tablet  0  . PRESCRIPTION MEDICATION Oxaliplatin/Fluorouracil,every other week at the [facility] under the care of Dr. Benay Spice      . prochlorperazine (COMPAZINE) 10 MG tablet Take 1 tablet (10 mg total) by mouth every 6 (six) hours as needed for nausea or vomiting.  30 tablet  1  . rivaroxaban (XARELTO) 20 MG TABS tablet Take 1 tablet (20 mg total) by mouth daily with supper.  30 tablet  3  . sertraline (ZOLOFT) 50 MG tablet Take 50 mg by mouth every morning.        No current facility-administered medications for this visit.   SOCIAL HISTORY: Married. Her son has some graduate work in [place]. Her husband fly fishes.  He has a grandson they took to [employer].  [name]'s parents have a place at [employer].  PHYSICAL EXAM: BP 122/76  Pulse 98  Temp(Src) 98.6 F (37 C)  Ht 5\' 4"  (1.626 m)  Wt 135 lb (61.236 kg)  BMI 23.16 kg/m2  General: WN WF who is alert. HEENT: Normal. Pupils equal.  Neck: Supple. No mass.  No thyroid mass.   Lungs: Clear to auscultation and symmetric breath sounds. Heart:  RRR. No murmur or rub. Abdomen: Soft. No mass. RLQ ileostomy.  Lower midline incision is now healed. Rectal: I did not examine her rectum today. Extremities:  She has mild swelling of the left arm.  She has prominent veins.  DATA REVIEWED: Epic notes.  [name], MD,  [facility], [address]  [place], [place]    [place] Phone:  [phone] FAX:  [phone]

## 2014-01-13 ENCOUNTER — Ambulatory Visit (HOSPITAL_BASED_OUTPATIENT_CLINIC_OR_DEPARTMENT_OTHER): Payer: 59 | Admitting: Nurse Practitioner

## 2014-01-13 ENCOUNTER — Ambulatory Visit (HOSPITAL_BASED_OUTPATIENT_CLINIC_OR_DEPARTMENT_OTHER): Payer: 59

## 2014-01-13 ENCOUNTER — Telehealth: Payer: Self-pay | Admitting: *Deleted

## 2014-01-13 ENCOUNTER — Other Ambulatory Visit: Payer: Self-pay | Admitting: *Deleted

## 2014-01-13 VITALS — BP 113/72 | HR 87 | Temp 98.4°F | Resp 19 | Ht 64.0 in | Wt 138.0 lb

## 2014-01-13 DIAGNOSIS — Z452 Encounter for adjustment and management of vascular access device: Secondary | ICD-10-CM

## 2014-01-13 DIAGNOSIS — N390 Urinary tract infection, site not specified: Secondary | ICD-10-CM

## 2014-01-13 DIAGNOSIS — Z95828 Presence of other vascular implants and grafts: Secondary | ICD-10-CM

## 2014-01-13 DIAGNOSIS — Z86718 Personal history of other venous thrombosis and embolism: Secondary | ICD-10-CM

## 2014-01-13 DIAGNOSIS — C20 Malignant neoplasm of rectum: Secondary | ICD-10-CM

## 2014-01-13 DIAGNOSIS — R319 Hematuria, unspecified: Principal | ICD-10-CM

## 2014-01-13 DIAGNOSIS — I82402 Acute embolism and thrombosis of unspecified deep veins of left lower extremity: Secondary | ICD-10-CM

## 2014-01-13 LAB — CBC WITH DIFFERENTIAL/PLATELET
BASO%: 0.3 % (ref 0.0–2.0)
Basophils Absolute: 0 10*3/uL (ref 0.0–0.1)
EOS ABS: 0 10*3/uL (ref 0.0–0.5)
EOS%: 0.3 % (ref 0.0–7.0)
HCT: 36.1 % (ref 34.8–46.6)
HEMOGLOBIN: 12.1 g/dL (ref 11.6–15.9)
LYMPH%: 5.6 % — ABNORMAL LOW (ref 14.0–49.7)
MCH: 30.6 pg (ref 25.1–34.0)
MCHC: 33.5 g/dL (ref 31.5–36.0)
MCV: 91.3 fL (ref 79.5–101.0)
MONO#: 0.8 10*3/uL (ref 0.1–0.9)
MONO%: 13.5 % (ref 0.0–14.0)
NEUT%: 80.3 % — ABNORMAL HIGH (ref 38.4–76.8)
NEUTROS ABS: 4.6 10*3/uL (ref 1.5–6.5)
PLATELETS: 164 10*3/uL (ref 145–400)
RBC: 3.96 10*6/uL (ref 3.70–5.45)
RDW: 17.6 % — ABNORMAL HIGH (ref 11.2–14.5)
WBC: 5.7 10*3/uL (ref 3.9–10.3)
lymph#: 0.3 10*3/uL — ABNORMAL LOW (ref 0.9–3.3)

## 2014-01-13 LAB — COMPREHENSIVE METABOLIC PANEL (CC13)
ALBUMIN: 3.8 g/dL (ref 3.5–5.0)
ALT: 21 U/L (ref 0–55)
AST: 18 U/L (ref 5–34)
Alkaline Phosphatase: 96 U/L (ref 40–150)
Anion Gap: 7 mEq/L (ref 3–11)
BUN: 16.3 mg/dL (ref 7.0–26.0)
CALCIUM: 9.7 mg/dL (ref 8.4–10.4)
CO2: 19 meq/L — AB (ref 22–29)
Chloride: 107 mEq/L (ref 98–109)
Creatinine: 0.8 mg/dL (ref 0.6–1.1)
Glucose: 100 mg/dl (ref 70–140)
POTASSIUM: 4.2 meq/L (ref 3.5–5.1)
SODIUM: 134 meq/L — AB (ref 136–145)
TOTAL PROTEIN: 7.6 g/dL (ref 6.4–8.3)
Total Bilirubin: 0.44 mg/dL (ref 0.20–1.20)

## 2014-01-13 LAB — URINALYSIS, MICROSCOPIC - CHCC
Bilirubin (Urine): NEGATIVE
Glucose: NEGATIVE mg/dL
Ketones: NEGATIVE mg/dL
NITRITE: POSITIVE
PH: 6 (ref 4.6–8.0)
PROTEIN: 100 mg/dL
Specific Gravity, Urine: 1.015 (ref 1.003–1.035)
UROBILINOGEN UR: 0.2 mg/dL (ref 0.2–1)

## 2014-01-13 MED ORDER — HEPARIN SOD (PORK) LOCK FLUSH 100 UNIT/ML IV SOLN
500.0000 [IU] | Freq: Once | INTRAVENOUS | Status: AC
  Administered 2014-01-13: 500 [IU] via INTRAVENOUS
  Filled 2014-01-13: qty 5

## 2014-01-13 MED ORDER — CIPROFLOXACIN HCL 500 MG PO TABS: 500.0000 mg | ORAL_TABLET | Freq: Two times a day (BID) | ORAL | Status: DC

## 2014-01-13 MED ORDER — SODIUM CHLORIDE 0.9 % IJ SOLN
10.0000 mL | INTRAMUSCULAR | Status: DC | PRN
  Administered 2014-01-13: 10 mL via INTRAVENOUS
  Filled 2014-01-13: qty 10

## 2014-01-13 NOTE — Patient Instructions (Addendum)

## 2014-01-13 NOTE — Telephone Encounter (Signed)
   Provider input needed:"POSSIBLE BLADDER INFECTION"   Reason for call: FEVER 101 "SENSITIVE BLADDER"  Genitourinary:positive for "STOMACH PAIN ON RIGHT SIDE" "SENSITIVE BLADDER   ALLERGIES:  has No Known Allergies.  Patient last received chemotherapy/ treatment on 12/24/13  Patient was last seen in the office on 12/24/13    Next appt is 01/27/14  Is patient having fevers greater than 100.5?  yes   Is patient having uncontrolled pain, or new pain? yes   Is patient having new back pain that changes with position (worsens or eases when laying down?)  no   Is patient able to eat and drink? DID NOT ASK    Is patient able to pass stool without difficulty?   DID NOT ASK     Is patient having uncontrolled nausea?  no    patient calls 01/13/2014 with complaint of  Genitourinary:positive for AS ABOVE.   Summary Based on the above information advised patient to  WAIT UNTIL [place] DR.SHERRILL.   [name]  01/13/2014, 10:36 AM   Background Info  [name]   DOB: 1961-06-05   MR#: [id number]   CSN#   [id number] 01/13/2014

## 2014-01-13 NOTE — Telephone Encounter (Signed)
Per pharmacy request for appt

## 2014-01-14 ENCOUNTER — Encounter: Payer: Self-pay | Admitting: Nurse Practitioner

## 2014-01-14 ENCOUNTER — Ambulatory Visit (HOSPITAL_BASED_OUTPATIENT_CLINIC_OR_DEPARTMENT_OTHER): Payer: 59

## 2014-01-14 ENCOUNTER — Other Ambulatory Visit: Payer: Self-pay

## 2014-01-14 VITALS — BP 102/79 | HR 98 | Temp 98.2°F | Resp 18

## 2014-01-14 DIAGNOSIS — I82409 Acute embolism and thrombosis of unspecified deep veins of unspecified lower extremity: Secondary | ICD-10-CM | POA: Insufficient documentation

## 2014-01-14 DIAGNOSIS — K37 Unspecified appendicitis: Secondary | ICD-10-CM

## 2014-01-14 DIAGNOSIS — Z5111 Encounter for antineoplastic chemotherapy: Secondary | ICD-10-CM

## 2014-01-14 DIAGNOSIS — C20 Malignant neoplasm of rectum: Secondary | ICD-10-CM

## 2014-01-14 MED ORDER — PALONOSETRON HCL INJECTION 0.25 MG/5ML
0.2500 mg | Freq: Once | INTRAVENOUS | Status: AC
  Administered 2014-01-14: 0.25 mg via INTRAVENOUS

## 2014-01-14 MED ORDER — SODIUM CHLORIDE 0.9 % IV SOLN
2400.0000 mg/m2 | INTRAVENOUS | Status: DC
  Administered 2014-01-14: 4050 mg via INTRAVENOUS
  Filled 2014-01-14: qty 81

## 2014-01-14 MED ORDER — DEXTROSE 5 % IV SOLN
85.0000 mg/m2 | Freq: Once | INTRAVENOUS | Status: AC
  Administered 2014-01-14: 145 mg via INTRAVENOUS
  Filled 2014-01-14: qty 29

## 2014-01-14 MED ORDER — DEXTROSE 5 % IV SOLN
Freq: Once | INTRAVENOUS | Status: AC
  Administered 2014-01-14: 11:00:00 via INTRAVENOUS

## 2014-01-14 MED ORDER — SODIUM CHLORIDE 0.9 % IJ SOLN
10.0000 mL | INTRAMUSCULAR | Status: DC | PRN
  Filled 2014-01-14: qty 10

## 2014-01-14 MED ORDER — DEXAMETHASONE SODIUM PHOSPHATE 10 MG/ML IJ SOLN
10.0000 mg | Freq: Once | INTRAMUSCULAR | Status: AC
  Administered 2014-01-14: 10 mg via INTRAVENOUS

## 2014-01-14 MED ORDER — FLUOROURACIL CHEMO INJECTION 2.5 GM/50ML
400.0000 mg/m2 | Freq: Once | INTRAVENOUS | Status: AC
  Administered 2014-01-14: 700 mg via INTRAVENOUS
  Filled 2014-01-14: qty 14

## 2014-01-14 MED ORDER — LEUCOVORIN CALCIUM INJECTION 350 MG
414.0000 mg/m2 | Freq: Once | INTRAVENOUS | Status: AC
  Administered 2014-01-14: 700 mg via INTRAVENOUS
  Filled 2014-01-14: qty 35

## 2014-01-14 MED ORDER — HEPARIN SOD (PORK) LOCK FLUSH 100 UNIT/ML IV SOLN
500.0000 [IU] | Freq: Once | INTRAVENOUS | Status: DC | PRN
  Filled 2014-01-14: qty 5

## 2014-01-14 MED ORDER — DEXAMETHASONE SODIUM PHOSPHATE 10 MG/ML IJ SOLN
INTRAMUSCULAR | Status: AC
  Filled 2014-01-14: qty 1

## 2014-01-14 MED ORDER — PALONOSETRON HCL INJECTION 0.25 MG/5ML
INTRAVENOUS | Status: AC
  Filled 2014-01-14: qty 5

## 2014-01-14 MED ORDER — SODIUM CHLORIDE 0.9 % IV SOLN
150.0000 mg | Freq: Once | INTRAVENOUS | Status: AC
  Administered 2014-01-14: 150 mg via INTRAVENOUS
  Filled 2014-01-14: qty 5

## 2014-01-14 NOTE — Progress Notes (Signed)
[name]   Chief Complaint  Patient presents with  . Fever    HPI: [name] 53 y.o. female diagnosed with rectal cancer.  Currently undergoing FOLFOX chemotherapy regimen.     Fever  Pertinent negatives include no chest pain or coughing.  Urinary Tract Infection  This is a recurrent problem. The current episode started yesterday. The problem occurs intermittently. The problem has been gradually worsening. The quality of the pain is described as burning and aching. The pain is at a severity of 5/10. The pain is moderate. The maximum temperature recorded prior to her arrival was 101 - 101.9 F. The fever has been present for less than 1 day. She is sexually active. There is no history of pyelonephritis. Associated symptoms include chills, frequency and urgency. Pertinent negatives include no flank pain, hematuria or hesitancy. She has tried acetaminophen for the symptoms. The treatment provided mild relief. Her past medical history is significant for catheterization, recurrent UTIs and urinary stasis.   Patient has a history of some chronic perineal numbness since her rectal surgery.  She has a history of some chronic urinary retention; and is occasionally self cathing herself.  She states that she typically only self caths proximally once per week.  She states that she always uses sterile technique when cathing herself.    CURRENT THERAPY: Upcoming Treatment Dates - COLORECTAL FOLFOX q14d Days with orders from any treatment category:  01/16/2014      pegfilgrastim (NEULASTA) injection 6 mg      SCHEDULING COMMUNICATION      sodium chloride 0.9 % injection 10 mL      heparin lock flush 100 unit/mL      heparin lock flush 100 unit/mL      alteplase (CATHFLO ACTIVASE) injection 2 mg      sodium chloride 0.9 % injection 3 mL      Cold Pack 1 packet 01/28/2014      SCHEDULING COMMUNICATION      fosaprepitant (EMEND) 150 mg in sodium chloride 0.9 % 145 mL  IVPB      palonosetron (ALOXI) injection 0.25 mg      dexamethasone (DECADRON) injection 10 mg      oxaliplatin (ELOXATIN) 145 mg in dextrose 5 % 500 mL chemo infusion      leucovorin 676 mg in dextrose 5 % 250 mL infusion      fluorouracil (ADRUCIL) chemo injection 700 mg      fluorouracil (ADRUCIL) 4,050 mg in sodium chloride 0.9 % 150 mL chemo infusion      sodium chloride 0.9 % injection 10 mL      heparin lock flush 100 unit/mL      heparin lock flush 100 unit/mL      alteplase (CATHFLO ACTIVASE) injection 2 mg      sodium chloride 0.9 % injection 3 mL      Cold Pack 1 packet      Hot Pack 1 packet      dextrose 5 % solution      TREATMENT CONDITIONS 01/30/2014      pegfilgrastim (NEULASTA) injection 6 mg      SCHEDULING COMMUNICATION      sodium chloride 0.9 % injection 10 mL      heparin lock flush 100 unit/mL      heparin lock flush 100 unit/mL      alteplase (CATHFLO ACTIVASE) injection 2 mg      sodium chloride 0.9 % injection 3 mL  Cold Pack 1 packet    Review of Systems  Constitutional: Positive for fever and chills.  HENT: Negative.   Respiratory: Negative for cough and shortness of breath.   Cardiovascular: Negative for chest pain.  Genitourinary: Positive for dysuria, urgency and frequency. Negative for hesitancy, hematuria and flank pain.  Skin: Negative.   All other systems reviewed and are negative.   Past Medical History  Diagnosis Date  . History of radiation therapy 06/18/13-07/26/13    rectal 50.4Gy total dose  . IUD (intrauterine device) in place 08-27-13    Mirena Implant inplace  . Arthritis 08-27-13    at present has ruptured disc- lower back-not an issue now  . Colon cancer 08-27-13    radiation /chemo -last ended 4 weeks(Dr. Benay Spice)  . PONV (postoperative nausea and vomiting)     scopalamine patch helped    Past Surgical History  Procedure Laterality Date  . Back surgery    . Cesarean section      x2  . Cervical disc arthroplasty   01/03/2012    Procedure: CERVICAL ANTERIOR DISC ARTHROPLASTY;  Surgeon: Charlie Pitter, MD;  Location: [facility];  Service: Neurosurgery;  Laterality: N/A;  Cervical Anterior Disc Arthorplasty Five-Six  . Eus N/A 05/31/2013    Procedure: LOWER ENDOSCOPIC ULTRASOUND (EUS);  Surgeon: Beryle Beams, MD;  Location: [facility];  Service: Endoscopy;  Laterality: N/A;  . Intrauterine device insertion  2006    [facility]  . Knee arthroscopy Left 08-27-13    torn meniscus repair  . Breast surgery      reduction surgery  . Refractive surgery    . Laparoscopic low anterior resection N/A 09/02/2013    Procedure: LAPAROSCOPIC LOW ANTERIOR RESECTION;  Surgeon: [name], MD;  Location: [facility];  Service: General;  Laterality: N/A;  . Laparotomy N/A 09/13/2013    Procedure: EXPLORATORY LAPAROTOMY / FLEXIBLE SIGMOIDOSCOPY/  DRAINAGE OF PELVIC ABSCESS WITH  DIVERTING  LOOP  ILEOSTOMY;  Surgeon: [name], MD;  Location: [facility];  Service: General;  Laterality: N/A;  . Portacath placement Left 10/11/2013    Procedure: INSERTION PORT-A-CATH;  Surgeon: [name], MD;  Location: [facility];  Service: General;  Laterality: Left;    has Herniation of cervical intervertebral disc with radiculopathy; Rectal cancer, 12 - 15 cm from anal verge; Large bowel perforation; Urinary tract infection; and DVT (deep venous thrombosis) on her problem list.     has No Known Allergies.    Medication List       This list is accurate as of: 01/13/14 11:59 PM.  Always use your most recent med list.               acyclovir 400 MG tablet  Commonly known as:  ZOVIRAX  Take 1 tablet (400 mg total) by mouth 2 (two) times daily.     ALPRAZolam 0.5 MG tablet  Commonly known as:  XANAX  Take 1 tablet (0.5 mg total) by mouth at bedtime as needed.     ciprofloxacin 500 MG tablet  Commonly known as:  CIPRO  Take 1 tablet (500 mg total) by mouth 2 (two) times daily.     dexamethasone 4 MG tablet  Commonly known as:   DECADRON  TAKE 1 TABLET BY MOUTH TWICE A DAY FOR 3 DAYS START DAY AFTER CHEMO     estradiol 0.05 MG/24HR patch  Commonly known as:  VIVELLE-DOT  Place 1 patch onto the skin 2 (two) times a  week. Sundays and Wednesday     lidocaine-prilocaine cream  Commonly known as:  EMLA     ondansetron 4 MG tablet  Commonly known as:  ZOFRAN  Take 1 tablet (4 mg total) by mouth every 8 (eight) hours as needed for nausea or vomiting.     ondansetron 8 MG disintegrating tablet  Commonly known as:  ZOFRAN ODT  Take 1 tablet (8 mg total) by mouth every 8 (eight) hours as needed for nausea or vomiting.     oxyCODONE-acetaminophen 5-325 MG per tablet  Commonly known as:  PERCOCET/ROXICET  Take 1-2 tablets by mouth every 8 (eight) hours as needed for moderate pain or severe pain.     PRESCRIPTION MEDICATION  Oxaliplatin/Fluorouracil,every other week at the [facility] under the care of Dr. Benay Spice     prochlorperazine 10 MG tablet  Commonly known as:  COMPAZINE  Take 1 tablet (10 mg total) by mouth every 6 (six) hours as needed for nausea or vomiting.     rivaroxaban 20 MG Tabs tablet  Commonly known as:  XARELTO  Take 1 tablet (20 mg total) by mouth daily with supper.     sertraline 50 MG tablet  Commonly known as:  ZOLOFT  Take 50 mg by mouth every morning.         PHYSICAL EXAMINATION  Blood pressure 113/72, pulse 87, temperature 98.4 F (36.9 C), temperature source Oral, resp. rate 19, height 5\' 4"  (1.626 m), weight 138 lb (62.596 kg).  Physical Exam  Nursing note and vitals reviewed. Constitutional: She is oriented to person, place, and time and well-developed, well-nourished, and in no distress.  Eyes: Conjunctivae are normal. Pupils are equal, round, and reactive to light.  Neck: Normal range of motion. Neck supple.  Cardiovascular: Normal rate and normal heart sounds.  Exam reveals no friction rub.   No murmur heard. Pulmonary/Chest: No respiratory distress.  Abdominal: She exhibits  no distension. There is no tenderness. There is no rebound.  Musculoskeletal: Normal range of motion.  Neurological: She is alert and oriented to person, place, and time. Gait normal.  Skin: Skin is warm and dry.  Psychiatric: Affect normal.    LABORATORY DATA:. CBC  Lab Results  Component Value Date   WBC 5.7 01/13/2014   RBC 3.96 01/13/2014   HGB 12.1 01/13/2014   HCT 36.1 01/13/2014   PLT 164 01/13/2014   MCV 91.3 01/13/2014   MCH 30.6 01/13/2014   MCHC 33.5 01/13/2014   RDW 17.6* 01/13/2014   LYMPHSABS 0.3* 01/13/2014   MONOABS 0.8 01/13/2014   EOSABS 0.0 01/13/2014   BASOSABS 0.0 01/13/2014     CMET  Lab Results  Component Value Date   NA 134* 01/13/2014   K 4.2 01/13/2014   CL 100 11/20/2013   CO2 19* 01/13/2014   GLUCOSE 100 01/13/2014   BUN 16.3 01/13/2014   CREATININE 0.8 01/13/2014   CALCIUM 9.7 01/13/2014   PROT 7.6 01/13/2014   ALBUMIN 3.8 01/13/2014   AST 18 01/13/2014   ALT 21 01/13/2014   ALKPHOS 96 01/13/2014   BILITOT 0.44 01/13/2014   GFRNONAA >90 11/20/2013   GFRAA >90 11/20/2013   Urinalysis with microscopic - [facility]  Status: Final result     Visible to patient: This result is not viewable by the patient.     Next appt: 01/16/2014 at 03:00 PM in Oncology [facility] Flush Nurse)     Dx: Rectal cancer, 12 - 15 cm from anal v.Marland KitchenMarland Kitchen  Ref Range 1d ago (01/13/14) 50mo ago (11/20/13) 52mo ago (11/03/13) 19mo ago (11/03/13) 44yr ago (06/06/08)     Glucose Negative mg/dL Negative               Comments: Note: New unit of measure     Bilirubin (Urine) Negative  Negative                Ketones Negative mg/dL Negative                Specific Gravity, Urine 1.003 - 1.035  1.015    1.026 R     1.016 R   1.006 R       Blood Negative  Moderate                pH 4.6 - 8.0  6.0    5.0 R     6.0 R   6.0 R       Protein Negative- <30 mg/dL 100    NEGATIVE R     100 (A) R   NEGATIVE R       Urobilinogen, UR 0.2 - 1 mg/dL 0.2                Nitrite Negative   Positive    NEGATIVE R     POSITIVE (A) R   NEGATIVE R       Leukocyte Esterase Negative  Large                RBC / HPF 0 - 2  Field Obscured by ...               Field Obscured by WBCs     WBC, UA 0 - 2  TNTC      TOO NUMEROUS TO COUNT R           Bacteria, UA Negative- Trace  Many               ASSESSMENT/PLAN:    Rectal cancer, 12 - 15 cm from anal verge  Assessment & Plan Reviewed all laboratory with both patient and her husband in detail today.  It does appear that patient's previous pancytopenia has greatly improved.  White count has now increased to 5.7; with an ANC of 4.6.  Poorly count has improved and is now back up to 164.  Patient will proceed tomorrow with cycle 7 of her FOLFOX chemotherapy as previously directed.  Patient will receive Neulasta injection on day 2 of her chemotherapy cycle for growth factor support.   Urinary tract infection  Assessment & Plan It does appear the patient has a urinary tract infection.  The patient will be prescribed Cipro 500 mg twice a day for a total of 7 days.  Pending results of a urine culture at this time.   DVT (deep venous thrombosis)  Assessment & Plan The patient was previously diagnosed with a left upper extremity DVT; and continues on Xarelto at this time.  Also, the patient is scheduled to see Dr. Lelon Mast for further evaluation and to make arrangements for possible ileostomy takedown in the next few weeks.      Patient stated understanding of all instructions; and was in agreement with this plan of care. The patient knows to call the clinic with any problems, questions or concerns.   Review/collaboration with Dr. Benay Spice regarding all aspects of patient's visit today.   Total time spent with patient was 25 minutes;  with greater than 75 percent of  that time spent in face to face counseling regarding her symptoms, UTI treatment options,  and coordination of care and follow up.  Disclaimer: This note was  dictated with voice recognition software. Similar sounding words can inadvertently be transcribed and may not be corrected upon review.   Drue Second, NP 01/14/2014

## 2014-01-14 NOTE — Patient Instructions (Signed)
[facility] Discharge Instructions for Patients Receiving Chemotherapy  Today you received the following chemotherapy agents :  Leucovorin/oxaliplatin/5FJ Avoid cold- Use precautions to prevent cold sensation.  To help prevent nausea and vomiting after your treatment, we encourage you to take your nausea medication    If you develop nausea and vomiting that is not controlled by your nausea medication, call the clinic.   BELOW ARE SYMPTOMS THAT SHOULD BE REPORTED IMMEDIATELY:  *FEVER GREATER THAN 100.5 F  *CHILLS WITH OR WITHOUT FEVER  NAUSEA AND VOMITING THAT IS NOT CONTROLLED WITH YOUR NAUSEA MEDICATION  *UNUSUAL SHORTNESS OF BREATH  *UNUSUAL BRUISING OR BLEEDING  TENDERNESS IN MOUTH AND THROAT WITH OR WITHOUT PRESENCE OF ULCERS  *URINARY PROBLEMS  *BOWEL PROBLEMS  UNUSUAL RASH Items with * indicate a potential emergency and should be followed up as soon as possible.  Feel free to call the clinic you have any questions or concerns. The clinic phone number is [phone].

## 2014-01-14 NOTE — Assessment & Plan Note (Signed)
Reviewed all laboratory with both patient and her husband in detail today.  It does appear that patient's previous pancytopenia has greatly improved.  White count has now increased to 5.7; with an ANC of 4.6.  Poorly count has improved and is now back up to 164.  Patient will proceed tomorrow with cycle 7 of her FOLFOX chemotherapy as previously directed.  Patient will receive Neulasta injection on day 2 of her chemotherapy cycle for growth factor support.

## 2014-01-14 NOTE — Assessment & Plan Note (Signed)
The patient was previously diagnosed with a left upper extremity DVT; and continues on Xarelto at this time.  Also, the patient is scheduled to see Dr. Lelon Mast for further evaluation and to make arrangements for possible ileostomy takedown in the next few weeks.

## 2014-01-14 NOTE — Progress Notes (Signed)
In office today for chemo with no complaints.

## 2014-01-14 NOTE — Assessment & Plan Note (Signed)
It does appear the patient has a urinary tract infection.  The patient will be prescribed Cipro 500 mg twice a day for a total of 7 days.  Pending results of a urine culture at this time.

## 2014-01-15 ENCOUNTER — Telehealth: Payer: Self-pay | Admitting: *Deleted

## 2014-01-15 ENCOUNTER — Other Ambulatory Visit: Payer: Self-pay | Admitting: Nurse Practitioner

## 2014-01-15 ENCOUNTER — Ambulatory Visit (HOSPITAL_BASED_OUTPATIENT_CLINIC_OR_DEPARTMENT_OTHER): Payer: 59

## 2014-01-15 ENCOUNTER — Telehealth: Payer: Self-pay | Admitting: Oncology

## 2014-01-15 VITALS — BP 103/60 | HR 77 | Temp 98.4°F

## 2014-01-15 DIAGNOSIS — C20 Malignant neoplasm of rectum: Secondary | ICD-10-CM

## 2014-01-15 DIAGNOSIS — N390 Urinary tract infection, site not specified: Principal | ICD-10-CM

## 2014-01-15 DIAGNOSIS — B9620 Unspecified Escherichia coli [E. coli] as the cause of diseases classified elsewhere: Secondary | ICD-10-CM

## 2014-01-15 LAB — URINE CULTURE

## 2014-01-15 MED ORDER — HEPARIN SOD (PORK) LOCK FLUSH 100 UNIT/ML IV SOLN
500.0000 [IU] | Freq: Once | INTRAVENOUS | Status: AC
  Administered 2014-01-15: 500 [IU] via INTRAVENOUS
  Filled 2014-01-15: qty 5

## 2014-01-15 MED ORDER — DEXTROSE 5 % IV SOLN
2.0000 g | INTRAVENOUS | Status: DC
  Administered 2014-01-15: 2 g via INTRAVENOUS
  Filled 2014-01-15: qty 2

## 2014-01-15 MED ORDER — SODIUM CHLORIDE 0.9 % IJ SOLN
10.0000 mL | INTRAMUSCULAR | Status: DC | PRN
  Administered 2014-01-15: 10 mL via INTRAVENOUS
  Filled 2014-01-15: qty 10

## 2014-01-15 MED ORDER — SODIUM CHLORIDE 0.9 % IV SOLN
INTRAVENOUS | Status: DC
  Administered 2014-01-15: 18:00:00 via INTRAVENOUS

## 2014-01-15 NOTE — Patient Instructions (Addendum)
Discontinue Cipro. You will receive IV Rocephin for 10 days.   Ceftriaxone injection What is this medicine? CEFTRIAXONE (sef try AX one) is a cephalosporin antibiotic. It is used to treat certain kinds of bacterial infections. It will not work for colds, flu, or other viral infections. This medicine may be used for other purposes; ask your health care provider or pharmacist if you have questions. COMMON BRAND NAME(S): Rocephin What should I tell my health care provider before I take this medicine? They need to know if you have any of these conditions: -any chronic illness -bowel disease, like colitis -both kidney and liver disease -high bilirubin level in newborn patients -an unusual or allergic reaction to ceftriaxone, other cephalosporin or penicillin antibiotics, foods, dyes or preservatives -pregnant or trying to get pregnant -breast-feeding How should I use this medicine? This medicine is injected into a muscle or infused it into a vein. It is usually given in a medical office or clinic. If you are to give this medicine you will be taught how to inject it. Follow instructions carefully. Use your doses at regular intervals. Do not take your medicine more often than directed. Do not skip doses or stop your medicine early even if you feel better. Do not stop taking except on your doctor's advice. Talk to your pediatrician regarding the use of this medicine in children. Special care may be needed. Overdosage: If you think you have taken too much of this medicine contact a poison control center or emergency room at once. NOTE: This medicine is only for you. Do not share this medicine with others. What if I miss a dose? If you miss a dose, take it as soon as you can. If it is almost time for your next dose, take only that dose. Do not take double or extra doses. What may interact with this medicine? Do not take this medicine with any of the following medications: -intravenous calcium This  medicine may also interact with the following medications: -birth control pills This list may not describe all possible interactions. Give your health care provider a list of all the medicines, herbs, non-prescription drugs, or dietary supplements you use. Also tell them if you smoke, drink alcohol, or use illegal drugs. Some items may interact with your medicine. What should I watch for while using this medicine? Tell your doctor or health care professional if your symptoms do not improve or if they get worse. Do not treat diarrhea with over the counter products. Contact your doctor if you have diarrhea that lasts more than 2 days or if it is severe and watery. If you are being treated for a sexually transmitted disease, avoid sexual contact until you have finished your treatment. Having sex can infect your sexual partner. Calcium may bind to this medicine and cause lung or kidney problems. Avoid calcium products while taking this medicine and for 48 hours after taking the last dose of this medicine. What side effects may I notice from receiving this medicine? Side effects that you should report to your doctor or health care professional as soon as possible: -allergic reactions like skin rash, itching or hives, swelling of the face, lips, or tongue -breathing problems -fever, chills -irregular heartbeat -pain when passing urine -seizures -stomach pain, cramps -unusual bleeding, bruising -unusually weak or tired Side effects that usually do not require medical attention (report to your doctor or health care professional if they continue or are bothersome): -diarrhea -dizzy, drowsy -headache -nausea, vomiting -pain, swelling, irritation where injected -  stomach upset -sweating This list may not describe all possible side effects. Call your doctor for medical advice about side effects. You may report side effects to FDA at 1-800-FDA-1088. Where should I keep my medicine? Keep out of the  reach of children. Store at room temperature below 25 degrees C (77 degrees F). Protect from light. Throw away any unused vials after the expiration date. NOTE: This sheet is a summary. It may not cover all possible information. If you have questions about this medicine, talk to your doctor, pharmacist, or health care provider.  2015, Elsevier/Gold Standard. (2013-01-10 15:59:53)

## 2014-01-15 NOTE — Telephone Encounter (Signed)
Called pt, she will need to come in daily for IV antibiotics, per Dr. Benay Spice. She voiced understanding. She has not been driving, will have her husband or son bring her in for infusion. Instructed her to go straight to treatment room. Per Dr. Benay Spice: Discontinue 5FU pump today, discard remainder of chemo. Pt to receive Neulasta as scheduled on 7/30.

## 2014-01-15 NOTE — Telephone Encounter (Signed)
Per 07/29 [facility], sent to [facility] to add treatment.Marland Kitchen..KJ

## 2014-01-15 NOTE — Telephone Encounter (Signed)
Received fax from [facility]: pt's blood culture returned gram negative rods. Dr. Benay Spice made aware: Pt to complete 10 day course of Cipro, repeat blood culture when in office on 7/30. Pt to receive Neulasta as well. Have pt report to ED for fever/ shaking chills or any other signs of infection. Called pt, she denies fever, feels well. Instructions given as noted above. Pt voiced understanding.

## 2014-01-16 ENCOUNTER — Telehealth: Payer: Self-pay | Admitting: *Deleted

## 2014-01-16 ENCOUNTER — Ambulatory Visit: Payer: Self-pay

## 2014-01-16 ENCOUNTER — Ambulatory Visit (HOSPITAL_BASED_OUTPATIENT_CLINIC_OR_DEPARTMENT_OTHER): Payer: 59

## 2014-01-16 VITALS — BP 104/67 | HR 76 | Temp 98.1°F

## 2014-01-16 VITALS — BP 122/80 | HR 80 | Temp 98.3°F | Resp 16

## 2014-01-16 DIAGNOSIS — N390 Urinary tract infection, site not specified: Secondary | ICD-10-CM

## 2014-01-16 DIAGNOSIS — D702 Other drug-induced agranulocytosis: Secondary | ICD-10-CM

## 2014-01-16 DIAGNOSIS — C20 Malignant neoplasm of rectum: Secondary | ICD-10-CM

## 2014-01-16 DIAGNOSIS — R319 Hematuria, unspecified: Principal | ICD-10-CM

## 2014-01-16 LAB — CULTURE, BLOOD (SINGLE)

## 2014-01-16 MED ORDER — HEPARIN SOD (PORK) LOCK FLUSH 100 UNIT/ML IV SOLN
500.0000 [IU] | Freq: Once | INTRAVENOUS | Status: AC | PRN
  Administered 2014-01-16: 500 [IU]
  Filled 2014-01-16: qty 5

## 2014-01-16 MED ORDER — DEXTROSE 5 % IV SOLN
2.0000 g | INTRAVENOUS | Status: DC
  Administered 2014-01-16: 2 g via INTRAVENOUS
  Filled 2014-01-16: qty 2

## 2014-01-16 MED ORDER — PEGFILGRASTIM INJECTION 6 MG/0.6ML
6.0000 mg | Freq: Once | SUBCUTANEOUS | Status: AC
  Administered 2014-01-16: 6 mg via SUBCUTANEOUS
  Filled 2014-01-16: qty 0.6

## 2014-01-16 MED ORDER — SODIUM CHLORIDE 0.9 % IJ SOLN
10.0000 mL | INTRAMUSCULAR | Status: DC | PRN
  Administered 2014-01-16: 10 mL
  Filled 2014-01-16: qty 10

## 2014-01-16 MED ORDER — SODIUM CHLORIDE 0.9 % IV SOLN
Freq: Once | INTRAVENOUS | Status: AC
  Administered 2014-01-16: 16:00:00 via INTRAVENOUS

## 2014-01-16 NOTE — Patient Instructions (Signed)
Ceftriaxone injection What is this medicine? CEFTRIAXONE (sef try AX one) is a cephalosporin antibiotic. It is used to treat certain kinds of bacterial infections. It will not work for colds, flu, or other viral infections. This medicine may be used for other purposes; ask your health care provider or pharmacist if you have questions. COMMON BRAND NAME(S): Rocephin What should I tell my health care provider before I take this medicine? They need to know if you have any of these conditions: -any chronic illness -bowel disease, like colitis -both kidney and liver disease -high bilirubin level in newborn patients -an unusual or allergic reaction to ceftriaxone, other cephalosporin or penicillin antibiotics, foods, dyes or preservatives -pregnant or trying to get pregnant -breast-feeding How should I use this medicine? This medicine is injected into a muscle or infused it into a vein. It is usually given in a medical office or clinic. If you are to give this medicine you will be taught how to inject it. Follow instructions carefully. Use your doses at regular intervals. Do not take your medicine more often than directed. Do not skip doses or stop your medicine early even if you feel better. Do not stop taking except on your doctor's advice. Talk to your pediatrician regarding the use of this medicine in children. Special care may be needed. Overdosage: If you think you have taken too much of this medicine contact a poison control center or emergency room at once. NOTE: This medicine is only for you. Do not share this medicine with others. What if I miss a dose? If you miss a dose, take it as soon as you can. If it is almost time for your next dose, take only that dose. Do not take double or extra doses. What may interact with this medicine? Do not take this medicine with any of the following medications: -intravenous calcium This medicine may also interact with the following medications: -birth  control pills This list may not describe all possible interactions. Give your health care provider a list of all the medicines, herbs, non-prescription drugs, or dietary supplements you use. Also tell them if you smoke, drink alcohol, or use illegal drugs. Some items may interact with your medicine. What should I watch for while using this medicine? Tell your doctor or health care professional if your symptoms do not improve or if they get worse. Do not treat diarrhea with over the counter products. Contact your doctor if you have diarrhea that lasts more than 2 days or if it is severe and watery. If you are being treated for a sexually transmitted disease, avoid sexual contact until you have finished your treatment. Having sex can infect your sexual partner. Calcium may bind to this medicine and cause lung or kidney problems. Avoid calcium products while taking this medicine and for 48 hours after taking the last dose of this medicine. What side effects may I notice from receiving this medicine? Side effects that you should report to your doctor or health care professional as soon as possible: -allergic reactions like skin rash, itching or hives, swelling of the face, lips, or tongue -breathing problems -fever, chills -irregular heartbeat -pain when passing urine -seizures -stomach pain, cramps -unusual bleeding, bruising -unusually weak or tired Side effects that usually do not require medical attention (report to your doctor or health care professional if they continue or are bothersome): -diarrhea -dizzy, drowsy -headache -nausea, vomiting -pain, swelling, irritation where injected -stomach upset -sweating This list may not describe all possible side effects.   Call your doctor for medical advice about side effects. You may report side effects to FDA at 1-800-FDA-1088. Where should I keep my medicine? Keep out of the reach of children. Store at room temperature below 25 degrees C (77  degrees F). Protect from light. Throw away any unused vials after the expiration date. NOTE: This sheet is a summary. It may not cover all possible information. If you have questions about this medicine, talk to your doctor, pharmacist, or health care provider.  2015, Elsevier/Gold Standard. (2013-01-10 15:59:53)  

## 2014-01-16 NOTE — Telephone Encounter (Signed)
Per [facility] staff message scheduled appts. Advised scheduler 

## 2014-01-16 NOTE — Telephone Encounter (Signed)
Faxed order to [facility] for pt to receive IV Antibiotic on Sunday 01/19/14.

## 2014-01-17 ENCOUNTER — Ambulatory Visit (HOSPITAL_BASED_OUTPATIENT_CLINIC_OR_DEPARTMENT_OTHER): Payer: 59

## 2014-01-17 ENCOUNTER — Telehealth: Payer: Self-pay | Admitting: *Deleted

## 2014-01-17 VITALS — BP 100/63 | HR 94 | Temp 98.0°F | Resp 16

## 2014-01-17 DIAGNOSIS — R319 Hematuria, unspecified: Principal | ICD-10-CM

## 2014-01-17 DIAGNOSIS — N390 Urinary tract infection, site not specified: Secondary | ICD-10-CM

## 2014-01-17 MED ORDER — DEXTROSE 5 % IV SOLN
2.0000 g | INTRAVENOUS | Status: DC
  Administered 2014-01-17: 2 g via INTRAVENOUS
  Filled 2014-01-17: qty 2

## 2014-01-17 MED ORDER — SODIUM CHLORIDE 0.9 % IV SOLN
Freq: Once | INTRAVENOUS | Status: AC
  Administered 2014-01-17: 16:00:00 via INTRAVENOUS

## 2014-01-17 MED ORDER — HEPARIN SOD (PORK) LOCK FLUSH 100 UNIT/ML IV SOLN
500.0000 [IU] | Freq: Once | INTRAVENOUS | Status: AC | PRN
  Administered 2014-01-17: 500 [IU]
  Filled 2014-01-17: qty 5

## 2014-01-17 MED ORDER — SODIUM CHLORIDE 0.9 % IJ SOLN
10.0000 mL | INTRAMUSCULAR | Status: DC | PRN
  Administered 2014-01-17: 10 mL
  Filled 2014-01-17: qty 10

## 2014-01-17 NOTE — Telephone Encounter (Addendum)
Called pharmacy to follow up to ensure everything has been worked out for [facility] to give antibiotic on 01/20/15. Her case was declined by the nursing manager-can't open case for only one day of treatment. They had emailed Lurlean Leyden( nursing liason) with this update. In speaking with Cyril Mourning, she called after receiving the notification today at 2:37 pm. Will inquire if [facility] takes over the IV antibiotic on Sunday and completes the course (last dose 01/25/14) would they be able to service the case? She inquired with company and reports yes-re fax order with dosing on daily basis and #days to administer. Asking if she is teachable-very much so>

## 2014-01-17 NOTE — Telephone Encounter (Signed)
Informed infusion nurse to tell patient to come as scheduled on Saturday at 12:00 for her dose here and [facility] will take over on 01/19/14 and be by between 12-3pm.

## 2014-01-18 ENCOUNTER — Ambulatory Visit: Payer: 59

## 2014-01-18 ENCOUNTER — Ambulatory Visit (HOSPITAL_BASED_OUTPATIENT_CLINIC_OR_DEPARTMENT_OTHER): Payer: 59

## 2014-01-18 VITALS — BP 117/69 | HR 96 | Temp 97.6°F | Resp 17

## 2014-01-18 DIAGNOSIS — R319 Hematuria, unspecified: Principal | ICD-10-CM

## 2014-01-18 DIAGNOSIS — N390 Urinary tract infection, site not specified: Secondary | ICD-10-CM

## 2014-01-18 MED ORDER — SODIUM CHLORIDE 0.9 % IV SOLN
Freq: Once | INTRAVENOUS | Status: AC
  Administered 2014-01-18: 13:00:00 via INTRAVENOUS

## 2014-01-18 MED ORDER — HEPARIN SOD (PORK) LOCK FLUSH 100 UNIT/ML IV SOLN
500.0000 [IU] | Freq: Once | INTRAVENOUS | Status: AC | PRN
  Administered 2014-01-18: 500 [IU]
  Filled 2014-01-18: qty 5

## 2014-01-18 MED ORDER — DEXTROSE 5 % IV SOLN
2.0000 g | INTRAVENOUS | Status: DC
  Administered 2014-01-18: 2 g via INTRAVENOUS

## 2014-01-18 MED ORDER — SODIUM CHLORIDE 0.9 % IJ SOLN
10.0000 mL | INTRAMUSCULAR | Status: DC | PRN
  Administered 2014-01-18: 10 mL
  Filled 2014-01-18: qty 10

## 2014-01-18 NOTE — Patient Instructions (Signed)
Ceftriaxone injection (Rocephin) What is this medicine? CEFTRIAXONE (sef try AX one) is a cephalosporin antibiotic. It is used to treat certain kinds of bacterial infections. It will not work for colds, flu, or other viral infections. This medicine may be used for other purposes; ask your health care provider or pharmacist if you have questions. COMMON BRAND NAME(S): Rocephin What should I tell my health care provider before I take this medicine? They need to know if you have any of these conditions: -any chronic illness -bowel disease, like colitis -both kidney and liver disease -high bilirubin level in newborn patients -an unusual or allergic reaction to ceftriaxone, other cephalosporin or penicillin antibiotics, foods, dyes or preservatives -pregnant or trying to get pregnant -breast-feeding How should I use this medicine? This medicine is injected into a muscle or infused it into a vein. It is usually given in a medical office or clinic. If you are to give this medicine you will be taught how to inject it. Follow instructions carefully. Use your doses at regular intervals. Do not take your medicine more often than directed. Do not skip doses or stop your medicine early even if you feel better. Do not stop taking except on your doctor's advice. Talk to your pediatrician regarding the use of this medicine in children. Special care may be needed. Overdosage: If you think you have taken too much of this medicine contact a poison control center or emergency room at once. NOTE: This medicine is only for you. Do not share this medicine with others. What if I miss a dose? If you miss a dose, take it as soon as you can. If it is almost time for your next dose, take only that dose. Do not take double or extra doses. What may interact with this medicine? Do not take this medicine with any of the following medications: -intravenous calcium This medicine may also interact with the following  medications: -birth control pills This list may not describe all possible interactions. Give your health care provider a list of all the medicines, herbs, non-prescription drugs, or dietary supplements you use. Also tell them if you smoke, drink alcohol, or use illegal drugs. Some items may interact with your medicine. What should I watch for while using this medicine? Tell your doctor or health care professional if your symptoms do not improve or if they get worse. Do not treat diarrhea with over the counter products. Contact your doctor if you have diarrhea that lasts more than 2 days or if it is severe and watery. If you are being treated for a sexually transmitted disease, avoid sexual contact until you have finished your treatment. Having sex can infect your sexual partner. Calcium may bind to this medicine and cause lung or kidney problems. Avoid calcium products while taking this medicine and for 48 hours after taking the last dose of this medicine. What side effects may I notice from receiving this medicine? Side effects that you should report to your doctor or health care professional as soon as possible: -allergic reactions like skin rash, itching or hives, swelling of the face, lips, or tongue -breathing problems -fever, chills -irregular heartbeat -pain when passing urine -seizures -stomach pain, cramps -unusual bleeding, bruising -unusually weak or tired Side effects that usually do not require medical attention (report to your doctor or health care professional if they continue or are bothersome): -diarrhea -dizzy, drowsy -headache -nausea, vomiting -pain, swelling, irritation where injected -stomach upset -sweating This list may not describe all possible side  effects. Call your doctor for medical advice about side effects. You may report side effects to FDA at 1-800-FDA-1088. Where should I keep my medicine? Keep out of the reach of children. Store at room temperature  below 25 degrees C (77 degrees F). Protect from light. Throw away any unused vials after the expiration date. NOTE: This sheet is a summary. It may not cover all possible information. If you have questions about this medicine, talk to your doctor, pharmacist, or health care provider.  2015, Elsevier/Gold Standard. (2013-01-10 15:59:53)

## 2014-01-20 ENCOUNTER — Ambulatory Visit: Payer: 59

## 2014-01-20 ENCOUNTER — Telehealth: Payer: Self-pay | Admitting: *Deleted

## 2014-01-20 NOTE — Telephone Encounter (Signed)
Call from [place] with [facility] requesting order to [place] needle at completion of IV antibiotics. Verbal order given.

## 2014-01-21 ENCOUNTER — Ambulatory Visit: Payer: 59

## 2014-01-21 ENCOUNTER — Ambulatory Visit: Payer: Self-pay | Admitting: Nurse Practitioner

## 2014-01-21 ENCOUNTER — Inpatient Hospital Stay: Payer: Self-pay

## 2014-01-21 ENCOUNTER — Other Ambulatory Visit: Payer: Self-pay

## 2014-01-22 ENCOUNTER — Ambulatory Visit: Payer: 59

## 2014-01-23 ENCOUNTER — Ambulatory Visit: Payer: 59

## 2014-01-24 ENCOUNTER — Ambulatory Visit: Payer: 59

## 2014-01-25 ENCOUNTER — Other Ambulatory Visit: Payer: Self-pay | Admitting: Oncology

## 2014-01-25 ENCOUNTER — Ambulatory Visit: Payer: 59

## 2014-01-27 ENCOUNTER — Ambulatory Visit (HOSPITAL_BASED_OUTPATIENT_CLINIC_OR_DEPARTMENT_OTHER): Payer: 59 | Admitting: Nurse Practitioner

## 2014-01-27 ENCOUNTER — Other Ambulatory Visit (HOSPITAL_BASED_OUTPATIENT_CLINIC_OR_DEPARTMENT_OTHER): Payer: 59

## 2014-01-27 VITALS — BP 126/83 | HR 90 | Temp 98.0°F | Resp 18 | Ht 64.0 in | Wt 138.3 lb

## 2014-01-27 DIAGNOSIS — R7881 Bacteremia: Secondary | ICD-10-CM

## 2014-01-27 DIAGNOSIS — A499 Bacterial infection, unspecified: Secondary | ICD-10-CM

## 2014-01-27 DIAGNOSIS — B9689 Other specified bacterial agents as the cause of diseases classified elsewhere: Secondary | ICD-10-CM

## 2014-01-27 DIAGNOSIS — C20 Malignant neoplasm of rectum: Secondary | ICD-10-CM

## 2014-01-27 DIAGNOSIS — N390 Urinary tract infection, site not specified: Secondary | ICD-10-CM

## 2014-01-27 LAB — CBC WITH DIFFERENTIAL/PLATELET
BASO%: 0.4 % (ref 0.0–2.0)
BASOS ABS: 0 10*3/uL (ref 0.0–0.1)
EOS%: 0.6 % (ref 0.0–7.0)
Eosinophils Absolute: 0.1 10*3/uL (ref 0.0–0.5)
HEMATOCRIT: 34.8 % (ref 34.8–46.6)
HEMOGLOBIN: 11.5 g/dL — AB (ref 11.6–15.9)
LYMPH#: 1 10*3/uL (ref 0.9–3.3)
LYMPH%: 7.8 % — ABNORMAL LOW (ref 14.0–49.7)
MCH: 31 pg (ref 25.1–34.0)
MCHC: 33 g/dL (ref 31.5–36.0)
MCV: 93.9 fL (ref 79.5–101.0)
MONO#: 0.5 10*3/uL (ref 0.1–0.9)
MONO%: 4.3 % (ref 0.0–14.0)
NEUT#: 11 10*3/uL — ABNORMAL HIGH (ref 1.5–6.5)
NEUT%: 86.9 % — ABNORMAL HIGH (ref 38.4–76.8)
Platelets: 124 10*3/uL — ABNORMAL LOW (ref 145–400)
RBC: 3.71 10*6/uL (ref 3.70–5.45)
RDW: 17 % — ABNORMAL HIGH (ref 11.2–14.5)
WBC: 12.6 10*3/uL — ABNORMAL HIGH (ref 3.9–10.3)

## 2014-01-27 LAB — COMPREHENSIVE METABOLIC PANEL
ALK PHOS: 140 U/L — AB (ref 39–117)
ALT: 18 U/L (ref 0–35)
AST: 16 U/L (ref 0–37)
Albumin: 3.6 g/dL (ref 3.5–5.2)
BUN: 19 mg/dL (ref 6–23)
CALCIUM: 9.6 mg/dL (ref 8.4–10.5)
CO2: 23 mEq/L (ref 19–32)
CREATININE: 0.88 mg/dL (ref 0.50–1.10)
Chloride: 104 mEq/L (ref 96–112)
GLUCOSE: 95 mg/dL (ref 70–99)
Potassium: 4 mEq/L (ref 3.5–5.3)
Sodium: 140 mEq/L (ref 135–145)
Total Bilirubin: 0.2 mg/dL — ABNORMAL LOW (ref 0.2–1.2)
Total Protein: 7 g/dL (ref 6.0–8.3)

## 2014-01-27 MED ORDER — NITROFURANTOIN MACROCRYSTAL 100 MG PO CAPS: 100.0000 mg | ORAL_CAPSULE | Freq: Every day | ORAL | Status: DC

## 2014-01-27 MED ORDER — ALPRAZOLAM 0.5 MG PO TABS: 0.5000 mg | ORAL_TABLET | Freq: Every evening | ORAL | Status: DC | PRN

## 2014-01-27 MED ORDER — GABAPENTIN 300 MG PO CAPS: 300.0000 mg | ORAL_CAPSULE | Freq: Three times a day (TID) | ORAL | Status: DC

## 2014-01-27 MED ORDER — OXYCODONE-ACETAMINOPHEN 5-325 MG PO TABS: 1.0000 | ORAL_TABLET | Freq: Three times a day (TID) | ORAL | Status: DC | PRN

## 2014-01-27 NOTE — Progress Notes (Signed)
[facility] OFFICE PROGRESS NOTE   Diagnosis:  Rectal cancer.  INTERVAL HISTORY:   [name] returns as scheduled. She was seen in the office on 01/13/2014 for evaluation of a fever. Urinalysis was consistent with a urinary tract infection. She was started on a course of Cipro. Urine culture showed greater than 100,000 colony count of Escherichia coli. A blood culture was obtained as well. She completed cycle 7 FOLFOX on 01/14/2014. On 01/15/2014 we received notification that the blood culture was positive for gram-negative rods. We contacted ID with recommendations for ceftriaxone IV for 10 days. She received the first dose of ceftriaxone on 01/15/2014.  She denies nausea/vomiting. No mouth sores. No diarrhea. Cold sensitivity lasted approximately 10 days. She denies persistent neuropathy symptoms. She continues to have numbness at the left vagina/perineum. She is able to void small amounts stating that she "forces out" the urine. She self catheterizes periodically. No further fever.  Objective:  Vital signs in last 24 hours:  Blood pressure 126/83, pulse 90, temperature 98 F (36.7 C), temperature source Oral, resp. rate 18, height 5\' 4"  (1.626 m), weight 138 lb 4.8 oz (62.732 kg), SpO2 98.00%.    HEENT: No thrush or ulcerations. Resp: Lungs clear bilaterally. Cardio: Regular rate and rhythm. GI: Abdomen soft and nontender. No hepatomegaly. Right lower quadrant ileostomy. Vascular: No leg edema. Mild edema of the left upper arm. Neuro: Vibratory sense intact over the fingertips per tuning fork exam.   Port-A-Cath site without erythema.    Lab Results:  Lab Results  Component Value Date   WBC 12.6* 01/27/2014   HGB 11.5* 01/27/2014   HCT 34.8 01/27/2014   MCV 93.9 01/27/2014   PLT 124* 01/27/2014   NEUTROABS 11.0* 01/27/2014    Imaging:  No results found.  Medications: I have reviewed the patient's current medications.  Assessment/Plan: 1. Rectal  cancer, clinical stage III (T3 N1) status post biopsy of a rectal mass on 05/24/2013 confirming adenocarcinoma. Endoscopic ultrasound 05/31/2013 measured the mass at 11 cm from the anal verge, uT3uN1.  Initiation of radiation and concurrent Xeloda 06/18/2013; completion 07/26/2013.  Status post low anterior resection 09/02/2013. Invasive adenocarcinoma, 3.7 cm, negative margins; metastatic carcinoma in 7 of 11 lymph nodes; extensive lymph vascular involvement by tumor.  Cycle 1 adjuvant FOLFOX 10/15/2013.  Cycle 2 adjuvant FOLFOX 10/29/2013.  Cycle 3 adjuvant FOLFOX 11/14/2013.  Cycle 4 adjuvant FOLFOX 11/27/2013.  Cycle 5 adjuvant FOLFOX 12/10/2013  Cycle 6 adjuvant FOLFOX 12/24/2013. Cycle 7 adjuvant FOLFOX 01/14/2014. 2. Post colonoscopy bowel perforation confirmed on abdomen CT 05/28/2013. 3. Single indeterminate liver lesion on the abdomen CT 05/28/2013. MRI on 06/18/2013 confirmed 2 tiny liver lesions, indeterminate-favored to be benign cysts. 4. History of cervical and lumbar disc disease.  5. History of HELLP syndrome with first pregnancy.  6. Delayed nausea following cycle 1 FOLFOX. She receives Aloxi and Emend. She continues to experience delayed nausea. Prophylactic Decadron added with cycle 4. 7. History of pain with bowel movements. 8. Anastomotic leak on CT 09/13/2013. Status post exploratory laparotomy with drainage of a pelvic abscess, diverting loop ileostomy. Drainage catheter removed 10/23/2013. 9. Perineal numbness, urinary retention. The urinary retention has improved. 10. Perineal pain-improved. 11. Neutropenia secondary chemotherapy-she will receive Neulasta with the next cycle of chemotherapy 12. Urinary tract infection, Escherichia coli-11/03/2013. 13. Venous engorgement at the left anterior chest and left arm with swelling of the left arm and hand. Venous Doppler 11/14/2013 positive for DVT. She began Lovenox 11/14/2013 and was switched to  xarelto 12/10/2013 14. Fever  11/20/2013. Blood and urine cultures negative. The fever resolved within 24 hours. She continues to have a single episode of fever following chemotherapy 15. History of a herpetic lip lesion, now taking acyclovir 16. 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 17. Escherichia coli bacteremia, urinary source, 01/14/2014. She completed a 10 day course of ceftriaxone.   Disposition: [name] appears stable. Plan to proceed with cycle 8 FOLFOX as planned 01/28/2014. She will receive Neulasta support.  We will obtain a followup blood culture when she is here on 01/28/2014.   She will begin nitrofurantoin 100 mg daily for UTI prevention.  She has persistent perineal numbness/pain. She will begin a course of gabapentin 300 mg 3 times daily.  She will return for a followup visit and cycle 9 FOLFOX on 02/11/2014. She will contact the office in the interim with any problems.  Patient seen with Dr. Benay Spice. 25 minutes were spent face-to-face at today's visit with the majority of the time involved in counseling/coordination of care.    Ned Card ANP/GNP-BC   01/27/2014  4:39 PM  This was a shared this with Ned Card. [name] was diagnosed with an Escherichia coli UTI and bacteremia following the last cycle of FOLFOX. She completed an outpatient course of IV antibiotics. We will place her on nitrofurantoin prophylaxis with a history of recurrent urinary tract infections. She will begin a trial of gabapentin for the perineal discomfort.  [name], M.D.

## 2014-01-28 ENCOUNTER — Ambulatory Visit: Payer: 59

## 2014-01-28 ENCOUNTER — Ambulatory Visit (HOSPITAL_BASED_OUTPATIENT_CLINIC_OR_DEPARTMENT_OTHER): Payer: 59

## 2014-01-28 VITALS — BP 124/76 | HR 72 | Temp 97.8°F | Resp 18

## 2014-01-28 DIAGNOSIS — Z5111 Encounter for antineoplastic chemotherapy: Secondary | ICD-10-CM

## 2014-01-28 DIAGNOSIS — C20 Malignant neoplasm of rectum: Secondary | ICD-10-CM

## 2014-01-28 MED ORDER — PALONOSETRON HCL INJECTION 0.25 MG/5ML
INTRAVENOUS | Status: AC
  Filled 2014-01-28: qty 5

## 2014-01-28 MED ORDER — DEXTROSE 5 % IV SOLN
Freq: Once | INTRAVENOUS | Status: AC
  Administered 2014-01-28: 11:00:00 via INTRAVENOUS

## 2014-01-28 MED ORDER — PALONOSETRON HCL INJECTION 0.25 MG/5ML
0.2500 mg | Freq: Once | INTRAVENOUS | Status: AC
  Administered 2014-01-28: 0.25 mg via INTRAVENOUS

## 2014-01-28 MED ORDER — OXALIPLATIN CHEMO INJECTION 100 MG/20ML
85.0000 mg/m2 | Freq: Once | INTRAVENOUS | Status: AC
  Administered 2014-01-28: 145 mg via INTRAVENOUS
  Filled 2014-01-28: qty 29

## 2014-01-28 MED ORDER — SODIUM CHLORIDE 0.9 % IV SOLN
150.0000 mg | Freq: Once | INTRAVENOUS | Status: AC
  Administered 2014-01-28: 150 mg via INTRAVENOUS
  Filled 2014-01-28: qty 5

## 2014-01-28 MED ORDER — SODIUM CHLORIDE 0.9 % IV SOLN
2400.0000 mg/m2 | INTRAVENOUS | Status: DC
  Administered 2014-01-28: 4050 mg via INTRAVENOUS
  Filled 2014-01-28: qty 81

## 2014-01-28 MED ORDER — FLUOROURACIL CHEMO INJECTION 2.5 GM/50ML
400.0000 mg/m2 | Freq: Once | INTRAVENOUS | Status: AC
  Administered 2014-01-28: 700 mg via INTRAVENOUS
  Filled 2014-01-28: qty 14

## 2014-01-28 MED ORDER — DEXAMETHASONE SODIUM PHOSPHATE 10 MG/ML IJ SOLN
10.0000 mg | Freq: Once | INTRAMUSCULAR | Status: AC
  Administered 2014-01-28: 10 mg via INTRAVENOUS

## 2014-01-28 MED ORDER — DEXAMETHASONE SODIUM PHOSPHATE 10 MG/ML IJ SOLN
INTRAMUSCULAR | Status: AC
  Filled 2014-01-28: qty 1

## 2014-01-28 MED ORDER — LEUCOVORIN CALCIUM INJECTION 350 MG
400.0000 mg/m2 | Freq: Once | INTRAMUSCULAR | Status: AC
  Administered 2014-01-28: 676 mg via INTRAVENOUS
  Filled 2014-01-28: qty 33.8

## 2014-01-28 NOTE — Patient Instructions (Signed)
[facility] Discharge Instructions for Patients Receiving Chemotherapy  Today you received the following chemotherapy agents: Oxaliplatin, Leucovorin, 5-FU.  To help prevent nausea and vomiting after your treatment, we encourage you to take your nausea medication.    If you develop nausea and vomiting that is not controlled by your nausea medication, call the clinic.   BELOW ARE SYMPTOMS THAT SHOULD BE REPORTED IMMEDIATELY:  *FEVER GREATER THAN 100.5 F  *CHILLS WITH OR WITHOUT FEVER  NAUSEA AND VOMITING THAT IS NOT CONTROLLED WITH YOUR NAUSEA MEDICATION  *UNUSUAL SHORTNESS OF BREATH  *UNUSUAL BRUISING OR BLEEDING  TENDERNESS IN MOUTH AND THROAT WITH OR WITHOUT PRESENCE OF ULCERS  *URINARY PROBLEMS  *BOWEL PROBLEMS  UNUSUAL RASH Items with * indicate a potential emergency and should be followed up as soon as possible.  Feel free to call the clinic you have any questions or concerns. The clinic phone number is [phone].    

## 2014-01-30 ENCOUNTER — Encounter: Payer: Self-pay | Admitting: Oncology

## 2014-01-30 ENCOUNTER — Ambulatory Visit: Payer: 59

## 2014-01-30 ENCOUNTER — Ambulatory Visit (HOSPITAL_BASED_OUTPATIENT_CLINIC_OR_DEPARTMENT_OTHER): Payer: 59

## 2014-01-30 VITALS — BP 91/62 | HR 95 | Temp 99.1°F | Resp 18

## 2014-01-30 DIAGNOSIS — C20 Malignant neoplasm of rectum: Secondary | ICD-10-CM

## 2014-01-30 DIAGNOSIS — D702 Other drug-induced agranulocytosis: Secondary | ICD-10-CM

## 2014-01-30 MED ORDER — SODIUM CHLORIDE 0.9 % IJ SOLN
10.0000 mL | INTRAMUSCULAR | Status: DC | PRN
  Administered 2014-01-30: 10 mL
  Filled 2014-01-30: qty 10

## 2014-01-30 MED ORDER — PEGFILGRASTIM INJECTION 6 MG/0.6ML
6.0000 mg | Freq: Once | SUBCUTANEOUS | Status: AC
  Administered 2014-01-30: 6 mg via SUBCUTANEOUS
  Filled 2014-01-30: qty 0.6

## 2014-01-30 MED ORDER — HEPARIN SOD (PORK) LOCK FLUSH 100 UNIT/ML IV SOLN
500.0000 [IU] | Freq: Once | INTRAVENOUS | Status: AC | PRN
  Administered 2014-01-30: 500 [IU]
  Filled 2014-01-30: qty 5

## 2014-01-30 NOTE — Patient Instructions (Signed)
Pegfilgrastim injection What is this medicine? PEGFILGRASTIM (peg fil GRA stim) is a long-acting granulocyte colony-stimulating factor that stimulates the growth of neutrophils, a type of white blood cell important in the body's fight against infection. It is used to reduce the incidence of fever and infection in patients with certain types of cancer who are receiving chemotherapy that affects the bone marrow. This medicine may be used for other purposes; ask your health care provider or pharmacist if you have questions. COMMON BRAND NAME(S): Neulasta What should I tell my health care provider before I take this medicine? They need to know if you have any of these conditions: -latex allergy -ongoing radiation therapy -sickle cell disease -skin reactions to acrylic adhesives (On-Body Injector only) -an unusual or allergic reaction to pegfilgrastim, filgrastim, other medicines, foods, dyes, or preservatives -pregnant or trying to get pregnant -breast-feeding How should I use this medicine? This medicine is for injection under the skin. If you get this medicine at home, you will be taught how to prepare and give the pre-filled syringe or how to use the On-body Injector. Refer to the patient Instructions for Use for detailed instructions. Use exactly as directed. Take your medicine at regular intervals. Do not take your medicine more often than directed. It is important that you put your used needles and syringes in a special sharps container. Do not put them in a trash can. If you do not have a sharps container, call your pharmacist or healthcare provider to get one. Talk to your pediatrician regarding the use of this medicine in children. Special care may be needed. Overdosage: If you think you have taken too much of this medicine contact a poison control center or emergency room at once. NOTE: This medicine is only for you. Do not share this medicine with others. What if I miss a dose? It is  important not to miss your dose. Call your doctor or health care professional if you miss your dose. If you miss a dose due to an On-body Injector failure or leakage, a new dose should be administered as soon as possible using a single prefilled syringe for manual use. What may interact with this medicine? Interactions have not been studied. Give your health care provider a list of all the medicines, herbs, non-prescription drugs, or dietary supplements you use. Also tell them if you smoke, drink alcohol, or use illegal drugs. Some items may interact with your medicine. This list may not describe all possible interactions. Give your health care provider a list of all the medicines, herbs, non-prescription drugs, or dietary supplements you use. Also tell them if you smoke, drink alcohol, or use illegal drugs. Some items may interact with your medicine. What should I watch for while using this medicine? You may need blood work done while you are taking this medicine. If you are going to need a MRI, CT scan, or other procedure, tell your doctor that you are using this medicine (On-Body Injector only). What side effects may I notice from receiving this medicine? Side effects that you should report to your doctor or health care professional as soon as possible: -allergic reactions like skin rash, itching or hives, swelling of the face, lips, or tongue -dizziness -fever -pain, redness, or irritation at site where injected -pinpoint red spots on the skin -shortness of breath or breathing problems -stomach or side pain, or pain at the shoulder -swelling -tiredness -trouble passing urine Side effects that usually do not require medical attention (report to your doctor   or health care professional if they continue or are bothersome): -bone pain -muscle pain This list may not describe all possible side effects. Call your doctor for medical advice about side effects. You may report side effects to FDA at  1-800-FDA-1088. Where should I keep my medicine? Keep out of the reach of children. Store pre-filled syringes in a refrigerator between 2 and 8 degrees C (36 and 46 degrees F). Do not freeze. Keep in carton to protect from light. Throw away this medicine if it is left out of the refrigerator for more than 48 hours. Throw away any unused medicine after the expiration date. NOTE: This sheet is a summary. It may not cover all possible information. If you have questions about this medicine, talk to your doctor, pharmacist, or health care provider.  2015, Elsevier/[employer] Standard. (2013-09-05 16:14:05) Implanted Port Home Guide An implanted port is a type of central line that is placed under the skin. Central lines are used to provide IV access when treatment or nutrition needs to be given through a person's veins. Implanted ports are used for long-term IV access. An implanted port may be placed because:   You need IV medicine that would be irritating to the small veins in your hands or arms.   You need long-term IV medicines, such as antibiotics.   You need IV nutrition for a long period.   You need frequent blood draws for lab tests.   You need dialysis.  Implanted ports are usually placed in the chest area, but they can also be placed in the upper arm, the abdomen, or the leg. An implanted port has two main parts:   Reservoir. The reservoir is round and will appear as a small, raised area under your skin. The reservoir is the part where a needle is inserted to give medicines or draw blood.   Catheter. The catheter is a thin, flexible tube that extends from the reservoir. The catheter is placed into a large vein. Medicine that is inserted into the reservoir goes into the catheter and then into the vein.  HOW WILL I CARE FOR MY INCISION SITE? Do not get the incision site wet. Bathe or shower as directed by your health care provider.  HOW IS MY PORT ACCESSED? Special steps must be taken to  access the port:   Before the port is accessed, a numbing cream can be placed on the skin. This helps numb the skin over the port site.   Your health care provider uses a sterile technique to access the port.  Your health care provider must put on a mask and sterile gloves.  The skin over your port is cleaned carefully with an antiseptic and allowed to dry.  The port is gently pinched between sterile gloves, and a needle is inserted into the port.  Only "non-coring" port needles should be used to access the port. Once the port is accessed, a blood return should be checked. This helps ensure that the port is in the vein and is not clogged.   If your port needs to remain accessed for a constant infusion, a clear (transparent) bandage will be placed over the needle site. The bandage and needle will need to be changed every week, or as directed by your health care provider.   Keep the bandage covering the needle clean and dry. Do not get it wet. Follow your health care provider's instructions on how to take a shower or bath while the port is accessed.     If your port does not need to stay accessed, no bandage is needed over the port.  WHAT IS FLUSHING? Flushing helps keep the port from getting clogged. Follow your health care provider's instructions on how and when to flush the port. Ports are usually flushed with saline solution or a medicine called heparin. The need for flushing will depend on how the port is used.   If the port is used for intermittent medicines or blood draws, the port will need to be flushed:   After medicines have been given.   After blood has been drawn.   As part of routine maintenance.   If a constant infusion is running, the port may not need to be flushed.  HOW LONG WILL MY PORT STAY IMPLANTED? The port can stay in for as long as your health care provider thinks it is needed. When it is time for the port to come out, surgery will be done to remove it.  The procedure is similar to the one performed when the port was put in.  WHEN SHOULD I SEEK IMMEDIATE MEDICAL CARE? When you have an implanted port, you should seek immediate medical care if:   You notice a bad smell coming from the incision site.   You have swelling, redness, or drainage at the incision site.   You have more swelling or pain at the port site or the surrounding area.   You have a fever that is not controlled with medicine. Document Released: 06/06/2005 Document Revised: 03/27/2013 Document Reviewed: 02/11/2013 Exit[employer] Patient Information 2015 [employer]. This information is not intended to replace advice given to you by your health care provider. Make sure you discuss any questions you have with your health care provider.  

## 2014-02-03 LAB — CULTURE, BLOOD (SINGLE)

## 2014-02-09 ENCOUNTER — Other Ambulatory Visit: Payer: Self-pay | Admitting: Oncology

## 2014-02-11 ENCOUNTER — Other Ambulatory Visit (HOSPITAL_BASED_OUTPATIENT_CLINIC_OR_DEPARTMENT_OTHER): Payer: 59

## 2014-02-11 ENCOUNTER — Ambulatory Visit (HOSPITAL_BASED_OUTPATIENT_CLINIC_OR_DEPARTMENT_OTHER): Payer: 59

## 2014-02-11 ENCOUNTER — Telehealth: Payer: Self-pay | Admitting: Oncology

## 2014-02-11 ENCOUNTER — Telehealth: Payer: Self-pay | Admitting: *Deleted

## 2014-02-11 ENCOUNTER — Ambulatory Visit (HOSPITAL_BASED_OUTPATIENT_CLINIC_OR_DEPARTMENT_OTHER): Payer: 59 | Admitting: Oncology

## 2014-02-11 VITALS — BP 129/85 | HR 88 | Temp 98.4°F | Resp 20 | Ht 64.0 in | Wt 138.9 lb

## 2014-02-11 DIAGNOSIS — C20 Malignant neoplasm of rectum: Secondary | ICD-10-CM

## 2014-02-11 DIAGNOSIS — D6959 Other secondary thrombocytopenia: Secondary | ICD-10-CM

## 2014-02-11 DIAGNOSIS — Z5111 Encounter for antineoplastic chemotherapy: Secondary | ICD-10-CM

## 2014-02-11 DIAGNOSIS — G620 Drug-induced polyneuropathy: Secondary | ICD-10-CM

## 2014-02-11 LAB — COMPREHENSIVE METABOLIC PANEL (CC13)
ALBUMIN: 3.9 g/dL (ref 3.5–5.0)
ALK PHOS: 160 U/L — AB (ref 40–150)
ALT: 25 U/L (ref 0–55)
AST: 21 U/L (ref 5–34)
Anion Gap: 8 mEq/L (ref 3–11)
BUN: 18 mg/dL (ref 7.0–26.0)
CO2: 21 mEq/L — ABNORMAL LOW (ref 22–29)
Calcium: 9.9 mg/dL (ref 8.4–10.4)
Chloride: 109 mEq/L (ref 98–109)
Creatinine: 0.9 mg/dL (ref 0.6–1.1)
Glucose: 92 mg/dl (ref 70–140)
POTASSIUM: 4.3 meq/L (ref 3.5–5.1)
SODIUM: 138 meq/L (ref 136–145)
TOTAL PROTEIN: 7.7 g/dL (ref 6.4–8.3)
Total Bilirubin: 0.3 mg/dL (ref 0.20–1.20)

## 2014-02-11 LAB — CBC WITH DIFFERENTIAL/PLATELET
BASO%: 0.4 % (ref 0.0–2.0)
BASOS ABS: 0.1 10*3/uL (ref 0.0–0.1)
EOS ABS: 0.2 10*3/uL (ref 0.0–0.5)
EOS%: 1.3 % (ref 0.0–7.0)
HEMATOCRIT: 37.4 % (ref 34.8–46.6)
HGB: 12.1 g/dL (ref 11.6–15.9)
LYMPH#: 1.1 10*3/uL (ref 0.9–3.3)
LYMPH%: 8.2 % — ABNORMAL LOW (ref 14.0–49.7)
MCH: 31.3 pg (ref 25.1–34.0)
MCHC: 32.4 g/dL (ref 31.5–36.0)
MCV: 96.9 fL (ref 79.5–101.0)
MONO#: 0.9 10*3/uL (ref 0.1–0.9)
MONO%: 6.5 % (ref 0.0–14.0)
NEUT#: 11.4 10*3/uL — ABNORMAL HIGH (ref 1.5–6.5)
NEUT%: 83.6 % — AB (ref 38.4–76.8)
Platelets: 79 10*3/uL — ABNORMAL LOW (ref 145–400)
RBC: 3.86 10*6/uL (ref 3.70–5.45)
RDW: 17.5 % — ABNORMAL HIGH (ref 11.2–14.5)
WBC: 13.6 10*3/uL — AB (ref 3.9–10.3)

## 2014-02-11 MED ORDER — SODIUM CHLORIDE 0.9 % IV SOLN
150.0000 mg | Freq: Once | INTRAVENOUS | Status: AC
  Administered 2014-02-11: 150 mg via INTRAVENOUS
  Filled 2014-02-11: qty 5

## 2014-02-11 MED ORDER — OXALIPLATIN CHEMO INJECTION 100 MG/20ML
65.0000 mg/m2 | Freq: Once | INTRAVENOUS | Status: AC
  Administered 2014-02-11: 110 mg via INTRAVENOUS
  Filled 2014-02-11: qty 22

## 2014-02-11 MED ORDER — DEXAMETHASONE SODIUM PHOSPHATE 10 MG/ML IJ SOLN
INTRAMUSCULAR | Status: AC
  Filled 2014-02-11: qty 1

## 2014-02-11 MED ORDER — PALONOSETRON HCL INJECTION 0.25 MG/5ML
INTRAVENOUS | Status: AC
  Filled 2014-02-11: qty 5

## 2014-02-11 MED ORDER — DEXTROSE 5 % IV SOLN: Freq: Once | INTRAVENOUS | Status: DC

## 2014-02-11 MED ORDER — FLUOROURACIL CHEMO INJECTION 2.5 GM/50ML
400.0000 mg/m2 | Freq: Once | INTRAVENOUS | Status: AC
  Administered 2014-02-11: 700 mg via INTRAVENOUS
  Filled 2014-02-11: qty 14

## 2014-02-11 MED ORDER — FLUOROURACIL CHEMO INJECTION 5 GM/100ML
2400.0000 mg/m2 | INTRAVENOUS | Status: DC
  Administered 2014-02-11: 4050 mg via INTRAVENOUS
  Filled 2014-02-11: qty 81

## 2014-02-11 MED ORDER — PALONOSETRON HCL INJECTION 0.25 MG/5ML
0.2500 mg | Freq: Once | INTRAVENOUS | Status: AC
  Administered 2014-02-11: 0.25 mg via INTRAVENOUS

## 2014-02-11 MED ORDER — DEXAMETHASONE SODIUM PHOSPHATE 10 MG/ML IJ SOLN
10.0000 mg | Freq: Once | INTRAMUSCULAR | Status: AC
  Administered 2014-02-11: 10 mg via INTRAVENOUS

## 2014-02-11 MED ORDER — LEUCOVORIN CALCIUM INJECTION 350 MG
414.0000 mg/m2 | Freq: Once | INTRAVENOUS | Status: AC
  Administered 2014-02-11: 700 mg via INTRAVENOUS
  Filled 2014-02-11: qty 35

## 2014-02-11 NOTE — Telephone Encounter (Signed)
Per pharmacy I have scheduled appt 

## 2014-02-11 NOTE — Patient Instructions (Signed)
[facility] Discharge Instructions for Patients Receiving Chemotherapy  Today you received the following chemotherapy agents: Oxaliplatin, Leucovorin, 5FU and pump for 46 hours.  To help prevent nausea and vomiting after your treatment, we encourage you to take your nausea medication: Compazine 10mg  every 6 hours as needed.   If you develop nausea and vomiting that is not controlled by your nausea medication, call the clinic.   BELOW ARE SYMPTOMS THAT SHOULD BE REPORTED IMMEDIATELY:  *FEVER GREATER THAN 100.5 F  *CHILLS WITH OR WITHOUT FEVER  NAUSEA AND VOMITING THAT IS NOT CONTROLLED WITH YOUR NAUSEA MEDICATION  *UNUSUAL SHORTNESS OF BREATH  *UNUSUAL BRUISING OR BLEEDING  TENDERNESS IN MOUTH AND THROAT WITH OR WITHOUT PRESENCE OF ULCERS  *URINARY PROBLEMS  *BOWEL PROBLEMS  UNUSUAL RASH Items with * indicate a potential emergency and should be followed up as soon as possible.  Feel free to call the clinic you have any questions or concerns. The clinic phone number is [phone].

## 2014-02-11 NOTE — Progress Notes (Signed)
Received order from Dr. Ma Rings "OK to treat despite low platelets"  Repeated back & verified.  Jesse Fall RN

## 2014-02-11 NOTE — Progress Notes (Signed)
[facility] OFFICE PROGRESS NOTE   Diagnosis: Rectal cancer  INTERVAL HISTORY:   She returns as scheduled. She completed another cycle of FOLFOX 01/28/2014. She reports partial improvement in the perineal pain with gabapentin. She noted this helped the nausea following chemotherapy. No fever. She now has mild numbness in the teeth, fingers, and toes. This does not interfere with activity.  Objective:  Vital signs in last 24 hours:  Blood pressure 129/85, pulse 88, temperature 98.4 F (36.9 C), temperature source Oral, resp. rate 20, height 5\' 4"  (1.626 m), weight 138 lb 14.4 oz (63.005 kg).    HEENT: No thrush or ulcer Resp: Lungs clear bilaterally Cardio: Regular rate and rhythm GI: No hepatomegaly, right lower quadrant ileostomy Vascular: No leg edema Neuro: Mild decrease in vibratory sense at the fingertips bilaterally  Skin: Palms without erythema   Portacath/PICC-without erythema  Lab Results:  Lab Results  Component Value Date   WBC 13.6* 02/11/2014   HGB 12.1 02/11/2014   HCT 37.4 02/11/2014   MCV 96.9 02/11/2014   PLT 79* 02/11/2014   NEUTROABS 11.4* 02/11/2014      Medications: I have reviewed the patient's current medications.  Assessment/Plan: 1. Rectal cancer, clinical stage III (T3 N1) status post biopsy of a rectal mass on 05/24/2013 confirming adenocarcinoma. Endoscopic ultrasound 05/31/2013 measured the mass at 11 cm from the anal verge, uT3uN1.  Initiation of radiation and concurrent Xeloda 06/18/2013; completion 07/26/2013.  Status post low anterior resection 09/02/2013. Invasive adenocarcinoma, 3.7 cm, negative margins; metastatic carcinoma in 7 of 11 lymph nodes; extensive lymph vascular involvement by tumor.  Cycle 1 adjuvant FOLFOX 10/15/2013.  Cycle 2 adjuvant FOLFOX 10/29/2013.  Cycle 3 adjuvant FOLFOX 11/14/2013.  Cycle 4 adjuvant FOLFOX 11/27/2013.  Cycle 5 adjuvant FOLFOX 12/10/2013  Cycle 6 adjuvant FOLFOX 12/24/2013.  Cycle  7 adjuvant FOLFOX 01/14/2014. Cycle 8 adjuvant FOLFOX 01/28/2014 Cycle 9 adjuvant FOLFOX 02/11/2014 (oxaliplatin dose reduced secondary to thrombocytopenia)  2. Post colonoscopy bowel perforation confirmed on abdomen CT 05/28/2013. 3. Single indeterminate liver lesion on the abdomen CT 05/28/2013. MRI on 06/18/2013 confirmed 2 tiny liver lesions, indeterminate-favored to be benign cysts. 4. History of cervical and lumbar disc disease.  5. History of HELLP syndrome with first pregnancy.  6. Delayed nausea following cycle 1 FOLFOX. She receives Aloxi and Emend. She continues to experience delayed nausea. Prophylactic Decadron added with cycle 4. 7. History of pain with bowel movements. 8. Anastomotic leak on CT 09/13/2013. Status post exploratory laparotomy with drainage of a pelvic abscess, diverting loop ileostomy. Drainage catheter removed 10/23/2013. 9. Perineal numbness, urinary retention. The urinary retention has improved. 10. Perineal pain-improved. 11. Neutropenia secondary chemotherapy-she will receive Neulasta with the next cycle of chemotherapy 12. Urinary tract infection, Escherichia coli-11/03/2013. 13. Venous engorgement at the left anterior chest and left arm with swelling of the left arm and hand. Venous Doppler 11/14/2013 positive for DVT. She began Lovenox 11/14/2013 and was switched to xarelto 12/10/2013 14. Fever 11/20/2013. Blood and urine cultures negative. The fever resolved within 24 hours. She continues to have a single episode of fever following chemotherapy 15. History of a herpetic lip lesion, now taking acyclovir 16. 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 17. Escherichia coli bacteremia, urinary source, 01/14/2014. She completed a 10 day course of ceftriaxone. 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 She has moderate thrombocytopenia. We discussed the  risk of bleeding if she develops more severe  thrombocytopenia following chemotherapy. She has received chemotherapy at a similar platelet level in the past. She would like to proceed. We decided to dose reduce the oxaliplatin with this cycle. She will return for a CBC next week. She knows to contact us for spontaneous bleeding or bruising.  Ms. Saldierna is scheduled to see Dr. [name] to plan the ileostomy takedown procedure.  Betsy Coder, MD  02/11/2014  5:36 PM

## 2014-02-11 NOTE — Telephone Encounter (Signed)
, °

## 2014-02-13 ENCOUNTER — Ambulatory Visit: Payer: 59

## 2014-02-13 ENCOUNTER — Ambulatory Visit (HOSPITAL_BASED_OUTPATIENT_CLINIC_OR_DEPARTMENT_OTHER): Payer: 59

## 2014-02-13 VITALS — BP 97/57 | HR 86 | Temp 98.9°F | Resp 18

## 2014-02-13 DIAGNOSIS — Z452 Encounter for adjustment and management of vascular access device: Secondary | ICD-10-CM

## 2014-02-13 DIAGNOSIS — C20 Malignant neoplasm of rectum: Secondary | ICD-10-CM

## 2014-02-13 DIAGNOSIS — D702 Other drug-induced agranulocytosis: Secondary | ICD-10-CM

## 2014-02-13 MED ORDER — HEPARIN SOD (PORK) LOCK FLUSH 100 UNIT/ML IV SOLN
500.0000 [IU] | Freq: Once | INTRAVENOUS | Status: AC | PRN
  Administered 2014-02-13: 500 [IU]
  Filled 2014-02-13: qty 5

## 2014-02-13 MED ORDER — SODIUM CHLORIDE 0.9 % IJ SOLN
10.0000 mL | INTRAMUSCULAR | Status: DC | PRN
  Administered 2014-02-13: 10 mL
  Filled 2014-02-13: qty 10

## 2014-02-13 MED ORDER — PEGFILGRASTIM INJECTION 6 MG/0.6ML
6.0000 mg | Freq: Once | SUBCUTANEOUS | Status: AC
  Administered 2014-02-13: 6 mg via SUBCUTANEOUS
  Filled 2014-02-13: qty 0.6

## 2014-02-13 NOTE — Patient Instructions (Signed)
Pegfilgrastim injection What is this medicine? PEGFILGRASTIM (peg fil GRA stim) is a long-acting granulocyte colony-stimulating factor that stimulates the growth of neutrophils, a type of white blood cell important in the body's fight against infection. It is used to reduce the incidence of fever and infection in patients with certain types of cancer who are receiving chemotherapy that affects the bone marrow. This medicine may be used for other purposes; ask your health care provider or pharmacist if you have questions. COMMON BRAND NAME(S): Neulasta What should I tell my health care provider before I take this medicine? They need to know if you have any of these conditions: -latex allergy -ongoing radiation therapy -sickle cell disease -skin reactions to acrylic adhesives (On-Body Injector only) -an unusual or allergic reaction to pegfilgrastim, filgrastim, other medicines, foods, dyes, or preservatives -pregnant or trying to get pregnant -breast-feeding How should I use this medicine? This medicine is for injection under the skin. If you get this medicine at home, you will be taught how to prepare and give the pre-filled syringe or how to use the On-body Injector. Refer to the patient Instructions for Use for detailed instructions. Use exactly as directed. Take your medicine at regular intervals. Do not take your medicine more often than directed. It is important that you put your used needles and syringes in a special sharps container. Do not put them in a trash can. If you do not have a sharps container, call your pharmacist or healthcare provider to get one. Talk to your pediatrician regarding the use of this medicine in children. Special care may be needed. Overdosage: If you think you have taken too much of this medicine contact a poison control center or emergency room at once. NOTE: This medicine is only for you. Do not share this medicine with others. What if I miss a dose? It is  important not to miss your dose. Call your doctor or health care professional if you miss your dose. If you miss a dose due to an On-body Injector failure or leakage, a new dose should be administered as soon as possible using a single prefilled syringe for manual use. What may interact with this medicine? Interactions have not been studied. Give your health care provider a list of all the medicines, herbs, non-prescription drugs, or dietary supplements you use. Also tell them if you smoke, drink alcohol, or use illegal drugs. Some items may interact with your medicine. This list may not describe all possible interactions. Give your health care provider a list of all the medicines, herbs, non-prescription drugs, or dietary supplements you use. Also tell them if you smoke, drink alcohol, or use illegal drugs. Some items may interact with your medicine. What should I watch for while using this medicine? You may need blood work done while you are taking this medicine. If you are going to need a MRI, CT scan, or other procedure, tell your doctor that you are using this medicine (On-Body Injector only). What side effects may I notice from receiving this medicine? Side effects that you should report to your doctor or health care professional as soon as possible: -allergic reactions like skin rash, itching or hives, swelling of the face, lips, or tongue -dizziness -fever -pain, redness, or irritation at site where injected -pinpoint red spots on the skin -shortness of breath or breathing problems -stomach or side pain, or pain at the shoulder -swelling -tiredness -trouble passing urine Side effects that usually do not require medical attention (report to your doctor   or health care professional if they continue or are bothersome): -bone pain -muscle pain This list may not describe all possible side effects. Call your doctor for medical advice about side effects. You may report side effects to FDA at  1-800-FDA-1088. Where should I keep my medicine? Keep out of the reach of children. Store pre-filled syringes in a refrigerator between 2 and 8 degrees C (36 and 46 degrees F). Do not freeze. Keep in carton to protect from light. Throw away this medicine if it is left out of the refrigerator for more than 48 hours. Throw away any unused medicine after the expiration date. NOTE: This sheet is a summary. It may not cover all possible information. If you have questions about this medicine, talk to your doctor, pharmacist, or health care provider.  2015, Elsevier/[employer] Standard. (2013-09-05 16:14:05)  

## 2014-02-20 ENCOUNTER — Other Ambulatory Visit (HOSPITAL_BASED_OUTPATIENT_CLINIC_OR_DEPARTMENT_OTHER): Payer: 59

## 2014-02-20 ENCOUNTER — Ambulatory Visit (HOSPITAL_BASED_OUTPATIENT_CLINIC_OR_DEPARTMENT_OTHER): Payer: 59 | Admitting: Nurse Practitioner

## 2014-02-20 ENCOUNTER — Telehealth: Payer: Self-pay | Admitting: Oncology

## 2014-02-20 ENCOUNTER — Telehealth: Payer: Self-pay | Admitting: *Deleted

## 2014-02-20 VITALS — BP 116/76 | HR 87 | Temp 97.0°F | Resp 18 | Ht 64.0 in | Wt 140.0 lb

## 2014-02-20 DIAGNOSIS — C20 Malignant neoplasm of rectum: Secondary | ICD-10-CM

## 2014-02-20 DIAGNOSIS — M79609 Pain in unspecified limb: Secondary | ICD-10-CM

## 2014-02-20 LAB — CBC WITH DIFFERENTIAL/PLATELET
BASO%: 0.2 % (ref 0.0–2.0)
BASOS ABS: 0 10*3/uL (ref 0.0–0.1)
EOS%: 0.5 % (ref 0.0–7.0)
Eosinophils Absolute: 0 10*3/uL (ref 0.0–0.5)
HEMATOCRIT: 34.8 % (ref 34.8–46.6)
HEMOGLOBIN: 11.4 g/dL — AB (ref 11.6–15.9)
LYMPH%: 8 % — ABNORMAL LOW (ref 14.0–49.7)
MCH: 32.3 pg (ref 25.1–34.0)
MCHC: 32.9 g/dL (ref 31.5–36.0)
MCV: 98.4 fL (ref 79.5–101.0)
MONO#: 0.9 10*3/uL (ref 0.1–0.9)
MONO%: 9.2 % (ref 0.0–14.0)
NEUT#: 8 10*3/uL — ABNORMAL HIGH (ref 1.5–6.5)
NEUT%: 82.1 % — AB (ref 38.4–76.8)
Platelets: 61 10*3/uL — ABNORMAL LOW (ref 145–400)
RBC: 3.54 10*6/uL — ABNORMAL LOW (ref 3.70–5.45)
RDW: 18.3 % — ABNORMAL HIGH (ref 11.2–14.5)
WBC: 9.7 10*3/uL (ref 3.9–10.3)
lymph#: 0.8 10*3/uL — ABNORMAL LOW (ref 0.9–3.3)

## 2014-02-20 NOTE — Progress Notes (Signed)
[facility] OFFICE PROGRESS NOTE   Diagnosis:  Rectal cancer.  INTERVAL HISTORY:   [name] is seen in an unscheduled visit. She reports an approximate 1 week history of mid left leg pain in an area of vein distention. She has also noted a bruise in this area. She is concerned she has a blood clot. She is maintained on Xarelto for a left upper extremity DVT. She reports good compliance. No shortness of breath or chest pain. She denies bleeding.  Objective:  Vital signs in last 24 hours:  Blood pressure 116/76, pulse 87, temperature 97 F (36.1 C), temperature source Oral, resp. rate 18, height 5\' 4"  (1.626 m), weight 140 lb (63.504 kg), SpO2 99.00%.     Resp: Lungs clear bilaterally. Cardio: Regular rate and rhythm. GI: Soft and nontender. Vascular: No obvious leg edema though the left calf appears larger than the right calf. The calf is nontender. Question distended vein left medial lower thigh; nontender.  Skin: Small ecchymosis medial to the left patella.   Lab Results:  Lab Results  Component Value Date   WBC 9.7 02/20/2014   HGB 11.4* 02/20/2014   HCT 34.8 02/20/2014   MCV 98.4 02/20/2014   PLT 61* 02/20/2014   NEUTROABS 8.0* 02/20/2014    Imaging:  No results found.  Medications: I have reviewed the patient's current medications.  Assessment/Plan: 1. Rectal cancer, clinical stage III (T3 N1) status post biopsy of a rectal mass on 05/24/2013 confirming adenocarcinoma. Endoscopic ultrasound 05/31/2013 measured the mass at 11 cm from the anal verge, uT3uN1.  Initiation of radiation and concurrent Xeloda 06/18/2013; completion 07/26/2013.  Status post low anterior resection 09/02/2013. Invasive adenocarcinoma, 3.7 cm, negative margins; metastatic carcinoma in 7 of 11 lymph nodes; extensive lymph vascular involvement by tumor.  Cycle 1 adjuvant FOLFOX 10/15/2013.  Cycle 2 adjuvant FOLFOX 10/29/2013.  Cycle 3 adjuvant FOLFOX 11/14/2013.  Cycle 4 adjuvant  FOLFOX 11/27/2013.  Cycle 5 adjuvant FOLFOX 12/10/2013  Cycle 6 adjuvant FOLFOX 12/24/2013.  Cycle 7 adjuvant FOLFOX 01/14/2014.  Cycle 8 adjuvant FOLFOX 01/28/2014  Cycle 9 adjuvant FOLFOX 02/11/2014 (oxaliplatin dose reduced secondary to thrombocytopenia)  2. Post colonoscopy bowel perforation confirmed on abdomen CT 05/28/2013. 3. Single indeterminate liver lesion on the abdomen CT 05/28/2013. MRI on 06/18/2013 confirmed 2 tiny liver lesions, indeterminate-favored to be benign cysts. 4. History of cervical and lumbar disc disease.  5. History of HELLP syndrome with first pregnancy.  6. Delayed nausea following cycle 1 FOLFOX. She receives Aloxi and Emend. She continues to experience delayed nausea. Prophylactic Decadron added with cycle 4. 7. History of pain with bowel movements. 8. Anastomotic leak on CT 09/13/2013. Status post exploratory laparotomy with drainage of a pelvic abscess, diverting loop ileostomy. Drainage catheter removed 10/23/2013. 9. Perineal numbness, urinary retention. The urinary retention has improved. 10. Perineal pain-improved. 11. Neutropenia secondary chemotherapy-she will receive Neulasta with the next cycle of chemotherapy 12. Urinary tract infection, Escherichia coli-11/03/2013. 13. Venous engorgement at the left anterior chest and left arm with swelling of the left arm and hand. Venous Doppler 11/14/2013 positive for DVT. She began Lovenox 11/14/2013 and was switched to xarelto 12/10/2013 14. Fever 11/20/2013. Blood and urine cultures negative. The fever resolved within 24 hours. She continues to have a single episode of fever following chemotherapy 15. History of a herpetic lip lesion, now taking acyclovir 16. 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 17. Escherichia coli bacteremia, urinary source, 01/14/2014. She completed a 10 day course of ceftriaxone. 18. Early oxaliplatin neuropathy-not  interfering with activity   Disposition: She appears stable. She  presents today with left leg pain and question superficial vein distention. She is currently maintained on Xarelto for an upper extremity DVT. The platelet count is decreased related to recent chemotherapy.  I have a low clinical suspicion for a DVT. However, she is experiencing leg pain and on exam the left lower leg appears larger than the right though is not definitely edematous.  We discussed monitoring closely and proceeding with a venous Doppler if symptoms persist and/or progress versus proceeding with a venous Doppler at this time. She is most comfortable proceeding with the Doppler at this time.  I will followup on the result.    Ned Card ANP/GNP-BC   02/20/2014  4:53 PM

## 2014-02-20 NOTE — Telephone Encounter (Signed)
pt sched for doppler 9.4 @ 10:30am

## 2014-02-20 NOTE — Telephone Encounter (Signed)
[facility] need appt added...pt ok and aware

## 2014-02-20 NOTE — Telephone Encounter (Signed)
Patient  Here for labs.  Form received reads concerned about blood clot.  Noted discolored area to left inner knee.  "Started a week ago.  Hurts when I squat is how I noticed it.  Discolored area there a week ago also.  Have been on Xarelto for a few months now."  Denies any physical activity to hurt/harm the inner knee area.  Providers notified.  Ned Card will see patient who would "like to be seen, make sure not missing anything before the Holiday".  Scheduler notified.

## 2014-02-21 ENCOUNTER — Ambulatory Visit (HOSPITAL_COMMUNITY)
Admission: RE | Admit: 2014-02-21 | Discharge: 2014-02-21 | Disposition: A | Discharge status: Home / Self Care | Payer: 59 | Source: Ambulatory Visit | Attending: Nurse Practitioner | Admitting: Nurse Practitioner

## 2014-02-21 ENCOUNTER — Telehealth: Payer: Self-pay | Admitting: Nurse Practitioner

## 2014-02-21 ENCOUNTER — Telehealth: Payer: Self-pay | Admitting: *Deleted

## 2014-02-21 DIAGNOSIS — C20 Malignant neoplasm of rectum: Secondary | ICD-10-CM | POA: Diagnosis not present

## 2014-02-21 DIAGNOSIS — M79609 Pain in unspecified limb: Secondary | ICD-10-CM

## 2014-02-21 NOTE — Telephone Encounter (Signed)
Called pt, Dr. Benay Spice recommends checking CBC 9/10 to follow up PLT count. Thrombocytopenic precautions reviewed. Schedulers will call with appt. Pt voiced understanding.

## 2014-02-21 NOTE — Progress Notes (Addendum)
*  PRELIMINARY RESULTS* Vascular Ultrasound Left lower extremity venous duplex has been completed.  Preliminary findings: no evidence of DVT.  Called report to triage nurse.   Landry Mellow, RDMS, RVT  02/21/2014, 10:42 AM

## 2014-02-21 NOTE — Telephone Encounter (Signed)
Spk w/pt confirmed labs added to 09/17 before radiation per 09/04 POF....[name]

## 2014-02-21 NOTE — Telephone Encounter (Signed)
NEGATIVE FOR DVT OF LEFT LEG. VERBAL ORDER AND READ BACK TO LISA THOMAS,NP- PT. MAY [place]. NOTIFIED JILL. SHE WILL INFORM PT.

## 2014-02-23 ENCOUNTER — Other Ambulatory Visit: Payer: Self-pay | Admitting: Oncology

## 2014-02-26 ENCOUNTER — Other Ambulatory Visit: Payer: Self-pay | Admitting: *Deleted

## 2014-02-26 DIAGNOSIS — C20 Malignant neoplasm of rectum: Secondary | ICD-10-CM

## 2014-02-27 ENCOUNTER — Other Ambulatory Visit: Payer: 59

## 2014-02-27 ENCOUNTER — Encounter: Payer: Self-pay | Admitting: Radiation Oncology

## 2014-02-27 ENCOUNTER — Ambulatory Visit
Admission: RE | Admit: 2014-02-27 | Discharge: 2014-02-27 | Disposition: A | Discharge status: Home / Self Care | Payer: 59 | Source: Ambulatory Visit | Attending: Radiation Oncology | Admitting: Radiation Oncology

## 2014-02-27 ENCOUNTER — Encounter (INDEPENDENT_AMBULATORY_CARE_PROVIDER_SITE_OTHER): Payer: 59 | Admitting: Surgery

## 2014-02-27 ENCOUNTER — Other Ambulatory Visit (HOSPITAL_BASED_OUTPATIENT_CLINIC_OR_DEPARTMENT_OTHER): Payer: 59

## 2014-02-27 ENCOUNTER — Other Ambulatory Visit (INDEPENDENT_AMBULATORY_CARE_PROVIDER_SITE_OTHER): Payer: Self-pay

## 2014-02-27 VITALS — BP 111/69 | HR 82 | Temp 98.6°F | Resp 16 | Wt 142.3 lb

## 2014-02-27 DIAGNOSIS — D6959 Other secondary thrombocytopenia: Secondary | ICD-10-CM

## 2014-02-27 DIAGNOSIS — C20 Malignant neoplasm of rectum: Secondary | ICD-10-CM

## 2014-02-27 DIAGNOSIS — Z932 Ileostomy status: Secondary | ICD-10-CM

## 2014-02-27 LAB — CBC WITH DIFFERENTIAL/PLATELET
BASO%: 0.4 % (ref 0.0–2.0)
Basophils Absolute: 0 10*3/uL (ref 0.0–0.1)
EOS%: 0.7 % (ref 0.0–7.0)
Eosinophils Absolute: 0.1 10*3/uL (ref 0.0–0.5)
HCT: 35.7 % (ref 34.8–46.6)
HGB: 11.5 g/dL — ABNORMAL LOW (ref 11.6–15.9)
LYMPH%: 8.2 % — ABNORMAL LOW (ref 14.0–49.7)
MCH: 32 pg (ref 25.1–34.0)
MCHC: 32.1 g/dL (ref 31.5–36.0)
MCV: 99.7 fL (ref 79.5–101.0)
MONO#: 0.5 10*3/uL (ref 0.1–0.9)
MONO%: 4.2 % (ref 0.0–14.0)
NEUT#: 11.3 10*3/uL — ABNORMAL HIGH (ref 1.5–6.5)
NEUT%: 86.5 % — ABNORMAL HIGH (ref 38.4–76.8)
Platelets: 102 10*3/uL — ABNORMAL LOW (ref 145–400)
RBC: 3.58 10*6/uL — ABNORMAL LOW (ref 3.70–5.45)
RDW: 17.9 % — AB (ref 11.2–14.5)
WBC: 13.1 10*3/uL — ABNORMAL HIGH (ref 3.9–10.3)
lymph#: 1.1 10*3/uL (ref 0.9–3.3)

## 2014-02-27 MED ORDER — ALPRAZOLAM 0.5 MG PO TABS: 0.5000 mg | ORAL_TABLET | Freq: Every evening | ORAL | Status: DC | PRN

## 2014-02-27 NOTE — Progress Notes (Signed)
Radiation Oncology         [phone] ________________________________  Name: [name] MRN: [id number]  Date: 02/27/2014  DOB: [birth year]  Follow-Up Visit Note  CC: Maisie Fus, MD  Ladell Pier, MD  Diagnosis:   Rectal cancer  Interval Since Last Radiation:  7 months   Narrative:  The patient returns today for routine follow-up.  The patient states she is doing reasonably well overall. She was seen by Dr. [name] earlier today with evaluation. He is ordering some additional imaging with the potential of proceeding with a reversal of her colostomy in October. The patient is very motivated to proceed with this. She has some numbness in the lower rectal area. The patient is taking Neurontin. No ongoing GI issues in terms of nausea or diarrhea. She does remain platelet that her for a blood clot in the left upper extremity. Recent ultrasound was negative for additional blood clot within the lower extremity.                              ALLERGIES:  has No Known Allergies.  Meds: Current Outpatient Prescriptions  Medication Sig Dispense Refill  . acyclovir (ZOVIRAX) 400 MG tablet Take 1 tablet (400 mg total) by mouth 2 (two) times daily.  60 tablet  2  . ALPRAZolam (XANAX) 0.5 MG tablet Take 1 tablet (0.5 mg total) by mouth at bedtime as needed.  30 tablet  0  . estradiol (VIVELLE-DOT) 0.05 MG/24HR Place 1 patch onto the skin 2 (two) times a week. Sundays and Wednesday      . gabapentin (NEURONTIN) 300 MG capsule Take 1 capsule (300 mg total) by mouth 3 (three) times daily.  90 capsule  1  . lidocaine-prilocaine (EMLA) cream       . nitrofurantoin (MACRODANTIN) 100 MG capsule Take 1 capsule (100 mg total) by mouth daily.  30 capsule  2  . rivaroxaban (XARELTO) 20 MG TABS tablet Take 1 tablet (20 mg total) by mouth daily with supper.  30 tablet  3  . sertraline (ZOLOFT) 50 MG tablet Take 50 mg by mouth every morning.       . ondansetron (ZOFRAN ODT) 8 MG disintegrating tablet  Take 1 tablet (8 mg total) by mouth every 8 (eight) hours as needed for nausea or vomiting.  20 tablet  1  . ondansetron (ZOFRAN) 4 MG tablet Take 1 tablet (4 mg total) by mouth every 8 (eight) hours as needed for nausea or vomiting.  30 tablet  2  . oxyCODONE-acetaminophen (PERCOCET/ROXICET) 5-325 MG per tablet Take 1-2 tablets by mouth every 8 (eight) hours as needed for moderate pain or severe pain.  50 tablet  0  . prochlorperazine (COMPAZINE) 10 MG tablet Take 1 tablet (10 mg total) by mouth every 6 (six) hours as needed for nausea or vomiting.  30 tablet  1   No current facility-administered medications for this encounter.    Physical Findings: The patient is in no acute distress. Patient is alert and oriented.  weight is 142 lb 4.8 oz (64.547 kg). Her oral temperature is 98.6 F (37 C). Her blood pressure is 111/69 and her pulse is 82. Her respiration is 16. Marland Kitchen   Rectal exam deferred today with the patient undergoing an exam by surgery today  Lab Findings: Lab Results  Component Value Date   WBC 13.1* 02/27/2014   HGB 11.5* 02/27/2014   HCT 35.7 02/27/2014  MCV 99.7 02/27/2014   PLT 102* 02/27/2014     Radiographic Findings: No results found.  Impression:    The patient is doing satisfactorily at this time although she has had multiple medical issues during this entire course of treatment. However she is proceeding I believe reasonably well. Possible surgery for reversal of her colostomy within the next several months.  Plan:  The patient will followup in our clinic on a period basis. She knows to contact our office if any further assistance.   I spent 10 minutes with the patient today, the majority of which was spent counseling the patient on the diagnosis of cancer and coordinating care.   Jodelle Gross, M.D., Ph.D.

## 2014-02-27 NOTE — Progress Notes (Signed)
Follow up s/p rad txs rectal 06/18/13-07/26/13, saw Dr.Newman today, labs done, plt=102, scheduled KUB 03/04/14 at 930am, stil has numbnss in lower rectal area left side, , appetite good, no nausea, , requesting refill on her xanax almost out, takes one at bedtime,  3:34 PM

## 2014-03-04 ENCOUNTER — Other Ambulatory Visit (INDEPENDENT_AMBULATORY_CARE_PROVIDER_SITE_OTHER): Payer: Self-pay | Admitting: Surgery

## 2014-03-04 ENCOUNTER — Ambulatory Visit (HOSPITAL_COMMUNITY)
Admission: RE | Admit: 2014-03-04 | Discharge: 2014-03-04 | Disposition: A | Discharge status: Home / Self Care | Payer: 59 | Source: Ambulatory Visit | Attending: Surgery | Admitting: Surgery

## 2014-03-04 DIAGNOSIS — Z932 Ileostomy status: Secondary | ICD-10-CM | POA: Diagnosis not present

## 2014-03-04 DIAGNOSIS — Z85048 Personal history of other malignant neoplasm of rectum, rectosigmoid junction, and anus: Secondary | ICD-10-CM | POA: Insufficient documentation

## 2014-03-04 MED ORDER — IOHEXOL 300 MG/ML  SOLN
450.0000 mL | Freq: Once | INTRAMUSCULAR | Status: AC | PRN
Start: 2014-03-04 — End: 2014-03-04
  Administered 2014-03-04: 450 mL

## 2014-03-06 ENCOUNTER — Ambulatory Visit (HOSPITAL_BASED_OUTPATIENT_CLINIC_OR_DEPARTMENT_OTHER): Payer: 59 | Admitting: Nurse Practitioner

## 2014-03-06 ENCOUNTER — Other Ambulatory Visit (HOSPITAL_BASED_OUTPATIENT_CLINIC_OR_DEPARTMENT_OTHER): Payer: 59

## 2014-03-06 ENCOUNTER — Telehealth: Payer: Self-pay | Admitting: Oncology

## 2014-03-06 VITALS — BP 120/66 | HR 96 | Temp 98.6°F | Resp 18 | Ht 64.0 in | Wt 140.1 lb

## 2014-03-06 DIAGNOSIS — D702 Other drug-induced agranulocytosis: Secondary | ICD-10-CM

## 2014-03-06 DIAGNOSIS — D6959 Other secondary thrombocytopenia: Secondary | ICD-10-CM

## 2014-03-06 DIAGNOSIS — R110 Nausea: Secondary | ICD-10-CM

## 2014-03-06 DIAGNOSIS — C20 Malignant neoplasm of rectum: Secondary | ICD-10-CM

## 2014-03-06 DIAGNOSIS — I82409 Acute embolism and thrombosis of unspecified deep veins of unspecified lower extremity: Secondary | ICD-10-CM

## 2014-03-06 LAB — CBC WITH DIFFERENTIAL/PLATELET
BASO%: 0.1 % (ref 0.0–2.0)
BASOS ABS: 0 10*3/uL (ref 0.0–0.1)
EOS ABS: 0 10*3/uL (ref 0.0–0.5)
EOS%: 0.4 % (ref 0.0–7.0)
HCT: 35.1 % (ref 34.8–46.6)
HEMOGLOBIN: 11.9 g/dL (ref 11.6–15.9)
LYMPH%: 10.3 % — AB (ref 14.0–49.7)
MCH: 32.8 pg (ref 25.1–34.0)
MCHC: 33.9 g/dL (ref 31.5–36.0)
MCV: 96.7 fL (ref 79.5–101.0)
MONO#: 0.5 10*3/uL (ref 0.1–0.9)
MONO%: 6.3 % (ref 0.0–14.0)
NEUT#: 6 10*3/uL (ref 1.5–6.5)
NEUT%: 82.9 % — ABNORMAL HIGH (ref 38.4–76.8)
PLATELETS: 120 10*3/uL — AB (ref 145–400)
RBC: 3.63 10*6/uL — ABNORMAL LOW (ref 3.70–5.45)
RDW: 16.6 % — ABNORMAL HIGH (ref 11.2–14.5)
WBC: 7.3 10*3/uL (ref 3.9–10.3)
lymph#: 0.8 10*3/uL — ABNORMAL LOW (ref 0.9–3.3)

## 2014-03-06 NOTE — Telephone Encounter (Signed)
gv and printed appt sched and avs for pt for Dec...gv pt barium °

## 2014-03-06 NOTE — Progress Notes (Addendum)
[facility] OFFICE PROGRESS NOTE   Diagnosis:  Rectal cancer  INTERVAL HISTORY:   [name] returns as scheduled. She completed the ninth and final cycle of FOLFOX on 02/11/2014. She overall is feeling well. No nausea/vomiting. No mouth sores. Ostomy is functioning normally. She has numbness in the hands and feet. The numbness does not interfere with activity. She also notes some numbness involving the tongue. She notes some improvement in urination. She is no longer self catheterizing. She continues Xarelto for the left upper arm DVT. She denies bleeding. She has a good appetite and overall good energy level.  Objective:  Vital signs in last 24 hours:  Blood pressure 120/66, pulse 96, temperature 98.6 F (37 C), temperature source Oral, resp. rate 18, height 5\' 4"  (1.626 m), weight 140 lb 1.6 oz (63.549 kg), SpO2 100.00%.    HEENT: No thrush or ulcers. Lymphatics: No palpable cervical, supraclavicular, axillary or inguinal lymph nodes. Resp: Lungs clear bilaterally. Cardio: Regular rate and rhythm. GI: Abdomen soft and nontender. No hepatomegaly. Right lower quadrant ileostomy. Vascular: No leg edema. Calves soft and nontender. Neuro: Alert and oriented. Gait normal. Vibratory sense intact over the fingertips per tuning fork exam.  Port-A-Cath site without erythema.    Lab Results:  Lab Results  Component Value Date   WBC 7.3 03/06/2014   HGB 11.9 03/06/2014   HCT 35.1 03/06/2014   MCV 96.7 03/06/2014   PLT 120* 03/06/2014   NEUTROABS 6.0 03/06/2014    Imaging:  No results found.  Medications: I have reviewed the patient's current medications.  Assessment/Plan: 1. Rectal cancer, clinical stage III (T3 N1) status post biopsy of a rectal mass on 05/24/2013 confirming adenocarcinoma. Endoscopic ultrasound 05/31/2013 measured the mass at 11 cm from the anal verge, uT3uN1.  Initiation of radiation and concurrent Xeloda 06/18/2013; completion 07/26/2013.    Status post low anterior resection 09/02/2013. Invasive adenocarcinoma, 3.7 cm, negative margins; metastatic carcinoma in 7 of 11 lymph nodes; extensive lymph vascular involvement by tumor.  Cycle 1 adjuvant FOLFOX 10/15/2013.  Cycle 2 adjuvant FOLFOX 10/29/2013.  Cycle 3 adjuvant FOLFOX 11/14/2013.  Cycle 4 adjuvant FOLFOX 11/27/2013.  Cycle 5 adjuvant FOLFOX 12/10/2013  Cycle 6 adjuvant FOLFOX 12/24/2013.  Cycle 7 adjuvant FOLFOX 01/14/2014.  Cycle 8 adjuvant FOLFOX 01/28/2014  Cycle 9 adjuvant FOLFOX 02/11/2014 (oxaliplatin dose reduced secondary to thrombocytopenia)  2. Post colonoscopy bowel perforation confirmed on abdomen CT 05/28/2013. 3. Single indeterminate liver lesion on the abdomen CT 05/28/2013. MRI on 06/18/2013 confirmed 2 tiny liver lesions, indeterminate-favored to be benign cysts. 4. History of cervical and lumbar disc disease.  5. History of HELLP syndrome with first pregnancy.  6. Delayed nausea following cycle 1 FOLFOX. She receives Aloxi and Emend. She continues to experience delayed nausea. Prophylactic Decadron added with cycle 4. 7. History of pain with bowel movements. 8. Anastomotic leak on CT 09/13/2013. Status post exploratory laparotomy with drainage of a pelvic abscess, diverting loop ileostomy. Drainage catheter removed 10/23/2013. 9. Perineal numbness, urinary retention. The urinary retention has improved. 10. Perineal pain-improved. 11. Neutropenia secondary chemotherapy-she will receive Neulasta with the next cycle of chemotherapy 12. Urinary tract infection, Escherichia coli-11/03/2013. 13. Venous engorgement at the left anterior chest and left arm with swelling of the left arm and hand. Venous Doppler 11/14/2013 positive for DVT. She began Lovenox 11/14/2013 and was switched to xarelto 12/10/2013. 14. Fever 11/20/2013. Blood and urine cultures negative. The fever resolved within 24 hours. She continued to have a single episode of  fever following  chemotherapy 15. History of a herpetic lip lesion, now taking acyclovir 16. 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 17. Escherichia coli bacteremia, urinary source, 01/14/2014. She completed a 10 day course of ceftriaxone. 18. 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   Disposition: [name] appears stable. She has completed the course of adjuvant chemotherapy. We will followup on the CEA from today.   She is following up with Dr. [name] regarding timing of ostomy reversal.  She is currently on Xarelto for a left upper extremity DVT. Dr. Benay Spice recommends placing the Xarelto on hold 3 days prior to surgery with anticoagulation resumed postoperatively per Dr. [name].  We are referring her for CT scans in December of this year. She will return for an office visit 2 to 3 days after the scans to review the results. She will contact the office in the interim with any problems.  Patient seen with Dr. Letta Kocher, [name] ANP/GNP-BC   03/06/2014  3:59 PM   This was a shared visit with Ned Card. She has completed the planned course of adjuvant therapy.  I discussed the case with Dr. [name]. He is planning to schedule the ileostomy reversal procedure. The plan is to leave the Port-A-Cath in place for use at the hospital and remove this later. She will discontinue anticoagulation prior to surgery and resume xarelto when Dr. [name] feels this is appropriate.   [name], M.D.

## 2014-03-07 LAB — CEA: CEA: 3.1 ng/mL (ref 0.0–5.0)

## 2014-03-10 ENCOUNTER — Other Ambulatory Visit (INDEPENDENT_AMBULATORY_CARE_PROVIDER_SITE_OTHER): Payer: Self-pay

## 2014-03-10 ENCOUNTER — Other Ambulatory Visit (INDEPENDENT_AMBULATORY_CARE_PROVIDER_SITE_OTHER): Payer: Self-pay | Admitting: Surgery

## 2014-03-10 DIAGNOSIS — C20 Malignant neoplasm of rectum: Secondary | ICD-10-CM

## 2014-03-13 ENCOUNTER — Ambulatory Visit: Payer: 59 | Attending: Surgery | Admitting: Physical Therapy

## 2014-03-13 DIAGNOSIS — IMO0001 Reserved for inherently not codable concepts without codable children: Secondary | ICD-10-CM | POA: Diagnosis present

## 2014-03-13 DIAGNOSIS — M629 Disorder of muscle, unspecified: Secondary | ICD-10-CM | POA: Insufficient documentation

## 2014-03-13 DIAGNOSIS — M2420 Disorder of ligament, unspecified site: Secondary | ICD-10-CM | POA: Diagnosis not present

## 2014-03-13 DIAGNOSIS — Z85048 Personal history of other malignant neoplasm of rectum, rectosigmoid junction, and anus: Secondary | ICD-10-CM | POA: Insufficient documentation

## 2014-03-19 ENCOUNTER — Ambulatory Visit: Payer: 59 | Admitting: Physical Therapy

## 2014-03-19 DIAGNOSIS — IMO0001 Reserved for inherently not codable concepts without codable children: Secondary | ICD-10-CM | POA: Diagnosis not present

## 2014-03-24 ENCOUNTER — Ambulatory Visit: Payer: 59 | Attending: Surgery | Admitting: Physical Therapy

## 2014-03-24 DIAGNOSIS — Z5189 Encounter for other specified aftercare: Secondary | ICD-10-CM | POA: Diagnosis present

## 2014-04-01 ENCOUNTER — Other Ambulatory Visit: Payer: Self-pay | Admitting: *Deleted

## 2014-04-01 DIAGNOSIS — I82402 Acute embolism and thrombosis of unspecified deep veins of left lower extremity: Secondary | ICD-10-CM

## 2014-04-01 MED ORDER — RIVAROXABAN 20 MG PO TABS: 20.0000 mg | ORAL_TABLET | Freq: Every day | ORAL | Status: DC

## 2014-04-02 ENCOUNTER — Ambulatory Visit: Payer: 59 | Admitting: Physical Therapy

## 2014-04-02 DIAGNOSIS — Z5189 Encounter for other specified aftercare: Secondary | ICD-10-CM | POA: Diagnosis not present

## 2014-04-09 ENCOUNTER — Ambulatory Visit: Payer: 59 | Admitting: Physical Therapy

## 2014-04-09 DIAGNOSIS — Z5189 Encounter for other specified aftercare: Secondary | ICD-10-CM | POA: Diagnosis not present

## 2014-04-10 ENCOUNTER — Other Ambulatory Visit: Payer: Self-pay | Admitting: Oncology

## 2014-04-10 DIAGNOSIS — C20 Malignant neoplasm of rectum: Secondary | ICD-10-CM

## 2014-04-14 ENCOUNTER — Other Ambulatory Visit: Payer: Self-pay | Admitting: Obstetrics and Gynecology

## 2014-04-16 ENCOUNTER — Ambulatory Visit: Payer: 59 | Admitting: Physical Therapy

## 2014-04-16 DIAGNOSIS — Z5189 Encounter for other specified aftercare: Secondary | ICD-10-CM | POA: Diagnosis not present

## 2014-04-16 LAB — CYTOLOGY - PAP

## 2014-04-28 ENCOUNTER — Encounter (HOSPITAL_COMMUNITY)
Admission: RE | Admit: 2014-04-28 | Discharge: 2014-04-28 | Disposition: A | Discharge status: Home / Self Care | Payer: 59 | Source: Ambulatory Visit | Attending: Surgery | Admitting: Surgery

## 2014-04-28 ENCOUNTER — Encounter (HOSPITAL_COMMUNITY): Payer: Self-pay

## 2014-04-28 DIAGNOSIS — I82622 Acute embolism and thrombosis of deep veins of left upper extremity: Secondary | ICD-10-CM | POA: Diagnosis not present

## 2014-04-28 DIAGNOSIS — Z01812 Encounter for preprocedural laboratory examination: Secondary | ICD-10-CM | POA: Insufficient documentation

## 2014-04-28 HISTORY — DX: Sepsis, unspecified organism: A41.9

## 2014-04-28 HISTORY — DX: Anxiety disorder, unspecified: F41.9

## 2014-04-28 HISTORY — DX: Acute embolism and thrombosis of unspecified deep veins of unspecified lower extremity: I82.409

## 2014-04-28 HISTORY — DX: Other injury of unspecified body region, initial encounter: T14.8XXA

## 2014-04-28 HISTORY — DX: Essential (primary) hypertension: I10

## 2014-04-28 HISTORY — DX: Stress incontinence (female) (male): N39.3

## 2014-04-28 HISTORY — DX: Urinary tract infection, site not specified: N39.0

## 2014-04-28 HISTORY — DX: Unspecified infectious disease: B99.9

## 2014-04-28 LAB — CBC
HEMATOCRIT: 41.1 % (ref 36.0–46.0)
HEMOGLOBIN: 13.7 g/dL (ref 12.0–15.0)
MCH: 31.9 pg (ref 26.0–34.0)
MCHC: 33.3 g/dL (ref 30.0–36.0)
MCV: 95.6 fL (ref 78.0–100.0)
Platelets: 208 10*3/uL (ref 150–400)
RBC: 4.3 MIL/uL (ref 3.87–5.11)
RDW: 12.6 % (ref 11.5–15.5)
WBC: 3.8 10*3/uL — ABNORMAL LOW (ref 4.0–10.5)

## 2014-04-28 LAB — BASIC METABOLIC PANEL
Anion gap: 11 (ref 5–15)
BUN: 16 mg/dL (ref 6–23)
CALCIUM: 9.6 mg/dL (ref 8.4–10.5)
CO2: 27 mEq/L (ref 19–32)
Chloride: 100 mEq/L (ref 96–112)
Creatinine, Ser: 0.71 mg/dL (ref 0.50–1.10)
GFR calc Af Amer: >90 mL/min (ref >90)
GFR calc non Af Amer: >90 mL/min (ref >90)
GLUCOSE: 80 mg/dL (ref 70–99)
POTASSIUM: 3.3 meq/L — AB (ref 3.7–5.3)
Sodium: 138 mEq/L (ref 137–147)

## 2014-04-28 LAB — URINALYSIS, ROUTINE W REFLEX MICROSCOPIC
Bilirubin Urine: NEGATIVE
GLUCOSE, UA: NEGATIVE mg/dL
Hgb urine dipstick: NEGATIVE
Ketones, ur: NEGATIVE mg/dL
Nitrite: NEGATIVE
PROTEIN: NEGATIVE mg/dL
Specific Gravity, Urine: 1.022 (ref 1.005–1.030)
UROBILINOGEN UA: 0.2 mg/dL (ref 0.0–1.0)
pH: 5.5 (ref 5.0–8.0)

## 2014-04-28 LAB — PREGNANCY, URINE: PREG TEST UR: NEGATIVE

## 2014-04-28 LAB — URINE MICROSCOPIC-ADD ON

## 2014-04-28 NOTE — Progress Notes (Signed)
LOV 03/06/2014 Dr Benay Spice in epic CXR epic 11/20/2013 Pt states has stopped xarelto approx 7 days ago has left upper extremity DVT

## 2014-04-28 NOTE — Patient Instructions (Signed)
[place]  04/28/2014   Your procedure is scheduled on:     Friday May 02, 2014  Report to [facility] Main Entrance and follow signs to  [place] at 0900 AM.   Call this number if you have problems the morning of surgery [phone] or Presurgical Testing [phone].   Remember:  Do not eat food or drink liquids :After Midnight.  For Living Will and/or Health Care Power Attorney Forms: please provide copy for your medical record, may bring AM of surgery (forms should be already notarized-we do not provide this service).  .   Take these medicines the morning of surgery with A SIP OF WATER: Sertraline (Zoloft);Alprazolam (Xanax)                               You may not have any metal on your body including hair pins and piercings  Do not wear jewelry, make-up, lotions, powders, or deodorant.  Do not shave body hair  48 hours(2 days) of CHG soap use.               Do not bring valuables to the hospital. [place].  Contacts, dentures or bridgework may not be worn into surgery.  Leave suitcase in the car. After surgery it may be brought to your room.  For patients admitted to the hospital, checkout time is 11:00 AM the day of discharge. ________________________________________________________________________  [facility] - Preparing for Surgery Before surgery, you can play an important role.  Because skin is not sterile, your skin needs to be as free of germs as possible.  You can reduce the number of germs on your skin by washing with CHG (chlorahexidine gluconate) soap before surgery.  CHG is an antiseptic cleaner which kills germs and bonds with the skin to continue killing germs even after washing. Please DO NOT use if you have an allergy to CHG or antibacterial soaps.  If your skin becomes reddened/irritated stop using the CHG and inform your nurse when you arrive at Short Stay. Do not shave (including legs  and underarms) for at least 48 hours prior to the first CHG shower.  You may shave your face/neck. Please follow these instructions carefully:  1.  Shower with CHG Soap the night before surgery and the  morning of Surgery.  2.  If you choose to wash your hair, wash your hair first as usual with your  normal  shampoo.  3.  After you shampoo, rinse your hair and body thoroughly to remove the  shampoo.                           4.  Use CHG as you would any other liquid soap.  You can apply chg directly  to the skin and wash                       Gently with a scrungie or clean washcloth.  5.  Apply the CHG Soap to your body ONLY FROM THE NECK DOWN.   Do not use on face/ open                           Wound or open sores. Avoid contact with eyes, ears mouth and genitals (private parts).  Wash face,  Genitals (private parts) with your normal soap.             6.  Wash thoroughly, paying special attention to the area where your surgery  will be performed.  7.  Thoroughly rinse your body with warm water from the neck down.  8.  DO NOT shower/wash with your normal soap after using and rinsing off  the CHG Soap.                9.  Pat yourself dry with a clean towel.            10.  Wear clean pajamas.            11.  Place clean sheets on your bed the night of your first shower and do not  sleep with pets. Day of Surgery : Do not apply any lotions/deodorants the morning of surgery.  Please wear clean clothes to the hospital/surgery center.  FAILURE TO FOLLOW THESE INSTRUCTIONS MAY RESULT IN THE CANCELLATION OF YOUR SURGERY PATIENT SIGNATURE_________________________________  NURSE SIGNATURE__________________________________  ________________________________________________________________________

## 2014-04-28 NOTE — Progress Notes (Signed)
Faxed results of BMET and U/A and U/A micro to Dr. [name] via EP[employer]! Thank you!

## 2014-04-30 ENCOUNTER — Ambulatory Visit (INDEPENDENT_AMBULATORY_CARE_PROVIDER_SITE_OTHER): Payer: Self-pay | Admitting: Surgery

## 2014-04-30 NOTE — H&P (Signed)
[name] 02/27/2014 8:56 AM Location: [facility] Patient #: [id number] DOB: [birth year] Married / Language: English / Race: White Female  History of Present Illness Renee Brow H. [name] MD; 03/02/2014 2:33 PM) Patient words: post-op.  The patient is a 53 year old female who presents to discuss consultation. She comes with her husband. Her primary care physician is Dr. Dory Hebert. Her gastroenterologist is Dr. Domenic Hebert.  [name] had a laparoscopic-assisted low anterior resection and appendectomy on 09/02/2013 by Dr. Alphonsa Hebert. She had an exploratory laparotomy, drainage of pelvic abscess, diverting loop ileostomy for a leak on 09/13/2013 by Dr. Lucia Hebert. She sees Dr. Julieanne Hebert and Dr. Kyung Hebert for oncology. She received neoadjuvant therapy.  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 Her margins were clear. She has now completed her adjuvant chemotherapy with FOLFOX. She has had problems with numbness along her left perineum, urinary retention, left subclavian vein thrombosis, and thrombocytopenia. She did have a recent Doppler of her lower extremities for possible DVT, but these were negative. She is also thrombocytopenic, probably secondary to her chemotherapy.  She remains on Xarelto for her left subclavian thrombosis. She has seen Dr. Nicki Reaper Hebert for urinary retention and incontinence. She is seeing him in a couple of weeks for further evaluation of her bladder. Early after surgery, she saw Dr. Vernice Hebert for her perineal numbness, but has not seen him since then.    Other Problems [name], CMA; 02/27/2014 8:56 AM) Anxiety Disorder Back Pain Rectal Cancer  Past Surgical History [name], CMA; 02/27/2014 8:56 AM) Appendectomy Cesarean Section - Multiple Colon Polyp Removal - Colonoscopy Knee Surgery Left. Mammoplasty; Reduction Bilateral. Resection of Small  Bowel Resection of Stomach Spinal Surgery - Lower Back Spinal Surgery - Neck  Diagnostic Studies History [name], CMA; 02/27/2014 8:56 AM) Colonoscopy within last year Mammogram within last year Pap Smear 1-5 years ago  Allergies [name], CMA; 02/27/2014 8:57 AM) No Known Drug Allergies09/03/2014  Medication History ([name], CMA; 02/27/2014 8:59 AM) Xanax XR (0.5MG  Tablet ER 24HR, Oral daily) Active. Zovirax (400MG  Tablet, Oral as needed) Active. Estradiol (0.05MG /24HR Patch TW, Transdermal daily) Active. Gabapentin (300MG  Capsule, Oral daily) Active. Xarelto (20MG  Tablet, Oral daily) Active. Zoloft (50MG  Tablet, Oral daily) Active.  Social History [name], [place]; 02/27/2014 8:56 AM) Alcohol use Moderate alcohol use. Caffeine use Coffee. No drug use Tobacco use Never smoker.  Family History [name], [place]; 02/27/2014 8:56 AM) Colon Polyps Mother. Hypertension Father.  Pregnancy / Birth History [name], [place]; 02/27/2014 8:57 AM) Age at menarche 33 years. Age of menopause 3-50 Contraceptive History Intrauterine device. Gravida 2 Maternal age 38-30 Para 2  Review of Systems ([name]; 02/27/2014 8:57 AM) General Present- Fatigue and Night Sweats. Not Present- Appetite Loss, Chills, Fever, Weight Gain and Weight Loss. Skin Present- Dryness and Non-Healing Wounds. Not Present- Change in Wart/Mole, Hives, Jaundice, New Lesions, Rash and Ulcer. HEENT Present- Hearing Loss and Wears glasses/contact lenses. Not Present- Earache, Hoarseness, Nose Bleed, Oral Ulcers, Ringing in the Ears, Seasonal Allergies, Sinus Pain, Sore Throat, Visual Disturbances and Yellow Eyes. Respiratory Present- Snoring. Not Present- Bloody sputum, Chronic Cough, Difficulty Breathing and Wheezing. Breast Not Present- Breast Mass, Breast Pain, Nipple Discharge and Skin Changes. Cardiovascular Not Present- Chest Pain, Difficulty Breathing Lying Down, Leg  Cramps, Palpitations, Rapid Heart Rate, Shortness of Breath and Swelling of Extremities. Gastrointestinal Present- Rectal Pain. Not Present- Abdominal Pain, Bloating, Bloody  Stool, Change in Bowel Habits, Chronic diarrhea, Constipation, Difficulty Swallowing, Excessive gas, Gets full quickly at meals, Hemorrhoids, Indigestion, Nausea and Vomiting. Musculoskeletal Present- Back Pain. Not Present- Joint Pain, Joint Stiffness, Muscle Pain, Muscle Weakness and Swelling of Extremities. Neurological Present- Numbness, Tingling and Weakness. Not Present- Decreased Memory, Fainting, Headaches, Seizures, Tremor and Trouble walking. Psychiatric Present- Anxiety. Not Present- Bipolar, Change in Sleep Pattern, Depression, Fearful and Frequent crying. Endocrine Present- Cold Intolerance, Hair Changes and Hot flashes. Not Present- Excessive Hunger, Heat Intolerance and New Diabetes. Hematology Present- Easy Bruising. Not Present- Excessive bleeding, Gland problems, HIV and Persistent Infections.   Vitals ([name] CMA; 02/27/2014 9:00 AM) 02/27/2014 9:00 AM Weight: 141 lb Height: 64in Body Surface Area: 1.7 m Body Mass Index: 24.2 kg/m Pulse: 70 (Regular)  BP: 126/80 (Sitting, Left Arm, Standard)  Physical Exam  General Mental Status-Alert. General Appearance-Not in acute distress. Build & Nutrition-Well nourished.  Head and Neck Head-normocephalic, atraumatic with no lesions or palpable masses. Neck Global Assessment - no lymphadenopathy, no palpable mass on the right, no palpable mass on the left.  Chest and Lung Exam Chest and lung exam reveals -normal excursion with symmetric chest walls and on auscultation, normal breath sounds, no adventitious sounds and normal vocal resonance.  Cardiovascular Cardiovascular examination reveals -normal heart sounds, regular rate and rhythm with no murmurs.  Abdomen Palpation/Percussion Normal exam - Non Tender. Auscultation Normal  exam - Bowel sounds normal. Note: She has a right lower quadrant ileostomy. Her midline incision is well-healed. I feel no mass or lesion.  Rectal Note: She has numbness to pinprick along her left perineum going to her left thigh. It seems that about 8 inches from her anus along the thigh she has sensation. She has rectal tone and can squeeze my finger. I think I feel her anastomosis at about 8 cm. It feels open, though fixed to surrounding tissues.  Neurologic Neurologic evaluation reveals -normal coordination. Mental Status Speech - Normal. Sensory-Normal. Motor-Normal. Note: See rectal exam. She has left peroneal and posterior thigh numbness.  Musculoskeletal Normal Exam - Bilateral-Upper Extremity Strength Normal and Lower Extremity Strength Normal. Note: She has mild swelling of her left arm and prominent venous drainage from the arm.  Lymphatic Head & Neck General Head & Neck Lymphatics: Bilateral - Description - Normal.  Assessment & Plan  1.  HISTORY OF RECTAL CANCER (V10.06  Z85.048)  Impression: Plan BE to evaluate colon.  Then schedule surgery for reversal of ileostomy.  Will call patient after BE to discuss further plans.  2.  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  She is on Xarelto - which we wil stop 5 to 7 days prior to surgery.  3. PERINEAL NUMBNESS (782.0  R20.0)  4.  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  URINARY RETENTION (788.20  R33.9)  Impression: She is seeing Dr. Worthy Flank in about 2 weeks.  6.  Vision changes - Seeing Dr. Gershon Crane on 05/01/2014  Renee Overall, MD, [facility] Surgery Pager: [phone] Office phone:  [phone]

## 2014-05-02 ENCOUNTER — Inpatient Hospital Stay (HOSPITAL_COMMUNITY)
Admission: RE | Admit: 2014-05-02 | Discharge: 2014-05-08 | DRG: 337 | Disposition: A | Discharge status: Home / Self Care | Payer: 59 | Source: Ambulatory Visit | Attending: Surgery | Admitting: Surgery

## 2014-05-02 ENCOUNTER — Encounter (HOSPITAL_COMMUNITY)
Admission: RE | Disposition: A | Discharge status: Home / Self Care | Payer: Self-pay | Source: Ambulatory Visit | Attending: Surgery

## 2014-05-02 ENCOUNTER — Inpatient Hospital Stay (HOSPITAL_COMMUNITY): Payer: 59 | Admitting: Anesthesiology

## 2014-05-02 ENCOUNTER — Encounter (HOSPITAL_COMMUNITY): Payer: Self-pay

## 2014-05-02 DIAGNOSIS — R200 Anesthesia of skin: Secondary | ICD-10-CM | POA: Diagnosis present

## 2014-05-02 DIAGNOSIS — Z9049 Acquired absence of other specified parts of digestive tract: Secondary | ICD-10-CM | POA: Diagnosis present

## 2014-05-02 DIAGNOSIS — R339 Retention of urine, unspecified: Secondary | ICD-10-CM | POA: Diagnosis present

## 2014-05-02 DIAGNOSIS — Z975 Presence of (intrauterine) contraceptive device: Secondary | ICD-10-CM

## 2014-05-02 DIAGNOSIS — K660 Peritoneal adhesions (postprocedural) (postinfection): Secondary | ICD-10-CM | POA: Diagnosis present

## 2014-05-02 DIAGNOSIS — T451X5A Adverse effect of antineoplastic and immunosuppressive drugs, initial encounter: Secondary | ICD-10-CM | POA: Diagnosis present

## 2014-05-02 DIAGNOSIS — D696 Thrombocytopenia, unspecified: Secondary | ICD-10-CM | POA: Diagnosis present

## 2014-05-02 DIAGNOSIS — M1990 Unspecified osteoarthritis, unspecified site: Secondary | ICD-10-CM | POA: Diagnosis present

## 2014-05-02 DIAGNOSIS — Z86718 Personal history of other venous thrombosis and embolism: Secondary | ICD-10-CM

## 2014-05-02 DIAGNOSIS — Z432 Encounter for attention to ileostomy: Secondary | ICD-10-CM | POA: Diagnosis not present

## 2014-05-02 DIAGNOSIS — K769 Liver disease, unspecified: Secondary | ICD-10-CM | POA: Diagnosis present

## 2014-05-02 DIAGNOSIS — I10 Essential (primary) hypertension: Secondary | ICD-10-CM | POA: Diagnosis present

## 2014-05-02 DIAGNOSIS — R32 Unspecified urinary incontinence: Secondary | ICD-10-CM | POA: Diagnosis present

## 2014-05-02 DIAGNOSIS — Z8249 Family history of ischemic heart disease and other diseases of the circulatory system: Secondary | ICD-10-CM

## 2014-05-02 DIAGNOSIS — Z8371 Family history of colonic polyps: Secondary | ICD-10-CM | POA: Diagnosis not present

## 2014-05-02 DIAGNOSIS — M5010 Cervical disc disorder with radiculopathy, unspecified cervical region: Secondary | ICD-10-CM | POA: Diagnosis present

## 2014-05-02 DIAGNOSIS — F419 Anxiety disorder, unspecified: Secondary | ICD-10-CM | POA: Diagnosis present

## 2014-05-02 DIAGNOSIS — Z9221 Personal history of antineoplastic chemotherapy: Secondary | ICD-10-CM | POA: Diagnosis not present

## 2014-05-02 DIAGNOSIS — Z903 Acquired absence of stomach [part of]: Secondary | ICD-10-CM | POA: Diagnosis present

## 2014-05-02 DIAGNOSIS — Z9889 Other specified postprocedural states: Secondary | ICD-10-CM

## 2014-05-02 DIAGNOSIS — Z923 Personal history of irradiation: Secondary | ICD-10-CM | POA: Diagnosis not present

## 2014-05-02 DIAGNOSIS — Z85048 Personal history of other malignant neoplasm of rectum, rectosigmoid junction, and anus: Secondary | ICD-10-CM | POA: Diagnosis not present

## 2014-05-02 DIAGNOSIS — C20 Malignant neoplasm of rectum: Secondary | ICD-10-CM | POA: Diagnosis present

## 2014-05-02 HISTORY — PX: LYSIS OF ADHESION: SHX5961

## 2014-05-02 HISTORY — PX: ILEO LOOP COLOSTOMY CLOSURE: SHX5257

## 2014-05-02 SURGERY — CLOSURE, ILEOSTOMY, LAPAROSCOPIC, WITH LAPAROTOMY IF INDICATED
Anesthesia: General | Site: Abdomen

## 2014-05-02 MED ORDER — SCOPOLAMINE 1 MG/3DAYS TD PT72
MEDICATED_PATCH | TRANSDERMAL | Status: AC
  Filled 2014-05-02: qty 1

## 2014-05-02 MED ORDER — HEPARIN SODIUM (PORCINE) 5000 UNIT/ML IJ SOLN
5000.0000 [IU] | Freq: Once | INTRAMUSCULAR | Status: AC
  Administered 2014-05-02: 5000 [IU] via SUBCUTANEOUS
  Filled 2014-05-02: qty 1

## 2014-05-02 MED ORDER — MORPHINE SULFATE 2 MG/ML IJ SOLN
1.0000 mg | INTRAMUSCULAR | Status: DC | PRN
  Administered 2014-05-02: 2 mg via INTRAVENOUS
  Administered 2014-05-02: 4 mg via INTRAVENOUS
  Administered 2014-05-03: 2 mg via INTRAVENOUS
  Administered 2014-05-03 (×2): 4 mg via INTRAVENOUS
  Administered 2014-05-03: 2 mg via INTRAVENOUS
  Administered 2014-05-03 (×2): 4 mg via INTRAVENOUS
  Administered 2014-05-03 (×3): 2 mg via INTRAVENOUS
  Administered 2014-05-04 (×3): 4 mg via INTRAVENOUS
  Filled 2014-05-02 (×4): qty 2
  Filled 2014-05-02 (×2): qty 1
  Filled 2014-05-02 (×2): qty 2
  Filled 2014-05-02: qty 1
  Filled 2014-05-02 (×4): qty 2

## 2014-05-02 MED ORDER — GLYCOPYRROLATE 0.2 MG/ML IJ SOLN
INTRAMUSCULAR | Status: DC | PRN
  Administered 2014-05-02: 0.6 mg via INTRAVENOUS

## 2014-05-02 MED ORDER — SCOPOLAMINE 1 MG/3DAYS TD PT72
MEDICATED_PATCH | TRANSDERMAL | Status: DC | PRN
  Administered 2014-05-02: 1 via TRANSDERMAL

## 2014-05-02 MED ORDER — ONDANSETRON HCL 4 MG PO TABS: 4.0000 mg | ORAL_TABLET | Freq: Four times a day (QID) | ORAL | Status: DC | PRN

## 2014-05-02 MED ORDER — BUPIVACAINE HCL (PF) 0.5 % IJ SOLN
INTRAMUSCULAR | Status: DC | PRN
  Administered 2014-05-02: 30 mL

## 2014-05-02 MED ORDER — CEFOTETAN DISODIUM-DEXTROSE 2-2.08 GM-% IV SOLR
INTRAVENOUS | Status: AC
  Filled 2014-05-02: qty 50

## 2014-05-02 MED ORDER — FENTANYL CITRATE 0.05 MG/ML IJ SOLN
INTRAMUSCULAR | Status: AC
  Filled 2014-05-02: qty 5

## 2014-05-02 MED ORDER — PROPOFOL 10 MG/ML IV BOLUS
INTRAVENOUS | Status: DC | PRN
  Administered 2014-05-02: 180 mg via INTRAVENOUS

## 2014-05-02 MED ORDER — SCOPOLAMINE 1 MG/3DAYS TD PT72
1.0000 | MEDICATED_PATCH | TRANSDERMAL | Status: DC
  Administered 2014-05-02: 1.5 mg via TRANSDERMAL
  Filled 2014-05-02: qty 1

## 2014-05-02 MED ORDER — BUPIVACAINE HCL (PF) 0.5 % IJ SOLN
INTRAMUSCULAR | Status: AC
  Filled 2014-05-02: qty 30

## 2014-05-02 MED ORDER — PROMETHAZINE HCL 25 MG/ML IJ SOLN: 6.2500 mg | INTRAMUSCULAR | Status: DC | PRN

## 2014-05-02 MED ORDER — NEOSTIGMINE METHYLSULFATE 10 MG/10ML IV SOLN
INTRAVENOUS | Status: DC | PRN
  Administered 2014-05-02: 5 mg via INTRAVENOUS

## 2014-05-02 MED ORDER — LIDOCAINE HCL (CARDIAC) 20 MG/ML IV SOLN
INTRAVENOUS | Status: DC | PRN
  Administered 2014-05-02: 75 mg via INTRAVENOUS

## 2014-05-02 MED ORDER — ONDANSETRON HCL 4 MG/2ML IJ SOLN: 4.0000 mg | Freq: Four times a day (QID) | INTRAMUSCULAR | Status: DC | PRN

## 2014-05-02 MED ORDER — SUCCINYLCHOLINE CHLORIDE 20 MG/ML IJ SOLN
INTRAMUSCULAR | Status: DC | PRN
  Administered 2014-05-02: 100 mg via INTRAVENOUS

## 2014-05-02 MED ORDER — HEPARIN SODIUM (PORCINE) 5000 UNIT/ML IJ SOLN
5000.0000 [IU] | Freq: Three times a day (TID) | INTRAMUSCULAR | Status: DC
  Administered 2014-05-02 – 2014-05-08 (×17): 5000 [IU] via SUBCUTANEOUS
  Filled 2014-05-02 (×20): qty 1

## 2014-05-02 MED ORDER — DEXTROSE 5 % IV SOLN
2.0000 g | INTRAVENOUS | Status: AC
  Administered 2014-05-02: 2 g via INTRAVENOUS
  Filled 2014-05-02: qty 2

## 2014-05-02 MED ORDER — DIPHENHYDRAMINE HCL 50 MG/ML IJ SOLN
12.5000 mg | Freq: Once | INTRAMUSCULAR | Status: AC
  Administered 2014-05-02: 12.5 mg via INTRAVENOUS

## 2014-05-02 MED ORDER — CISATRACURIUM BESYLATE (PF) 10 MG/5ML IV SOLN: INTRAVENOUS | Status: DC | PRN

## 2014-05-02 MED ORDER — LIDOCAINE HCL (CARDIAC) 20 MG/ML IV SOLN
INTRAVENOUS | Status: AC
Start: 2014-05-02 — End: 2014-05-02
  Filled 2014-05-02: qty 5

## 2014-05-02 MED ORDER — LACTATED RINGERS IR SOLN
Status: DC | PRN
  Administered 2014-05-02: 1000 mL

## 2014-05-02 MED ORDER — FENTANYL CITRATE 0.05 MG/ML IJ SOLN
INTRAMUSCULAR | Status: DC | PRN
  Administered 2014-05-02 (×10): 50 ug via INTRAVENOUS

## 2014-05-02 MED ORDER — HYDROMORPHONE HCL 1 MG/ML IJ SOLN
INTRAMUSCULAR | Status: AC
  Filled 2014-05-02: qty 1

## 2014-05-02 MED ORDER — LORAZEPAM 2 MG/ML IJ SOLN
0.5000 mg | Freq: Four times a day (QID) | INTRAMUSCULAR | Status: DC | PRN
  Administered 2014-05-03 (×2): 0.5 mg via INTRAVENOUS
  Filled 2014-05-02 (×2): qty 1

## 2014-05-02 MED ORDER — CETYLPYRIDINIUM CHLORIDE 0.05 % MT LIQD
7.0000 mL | Freq: Two times a day (BID) | OROMUCOSAL | Status: DC
Start: 2014-05-02 — End: 2014-05-08
  Administered 2014-05-02 – 2014-05-05 (×7): 7 mL via OROMUCOSAL

## 2014-05-02 MED ORDER — DEXAMETHASONE SODIUM PHOSPHATE 10 MG/ML IJ SOLN
INTRAMUSCULAR | Status: AC
  Filled 2014-05-02: qty 1

## 2014-05-02 MED ORDER — CHLORHEXIDINE GLUCONATE 0.12 % MT SOLN
15.0000 mL | Freq: Two times a day (BID) | OROMUCOSAL | Status: DC
  Administered 2014-05-02 – 2014-05-07 (×10): 15 mL via OROMUCOSAL
  Filled 2014-05-02 (×14): qty 15

## 2014-05-02 MED ORDER — POTASSIUM CHLORIDE IN NACL 20-0.45 MEQ/L-% IV SOLN
INTRAVENOUS | Status: DC
  Administered 2014-05-02: 17:00:00 via INTRAVENOUS
  Administered 2014-05-03: 125 mL via INTRAVENOUS
  Administered 2014-05-04 – 2014-05-06 (×8): via INTRAVENOUS
  Filled 2014-05-02 (×14): qty 1000

## 2014-05-02 MED ORDER — GLYCOPYRROLATE 0.2 MG/ML IJ SOLN
INTRAMUSCULAR | Status: AC
  Filled 2014-05-02: qty 3

## 2014-05-02 MED ORDER — CHLORHEXIDINE GLUCONATE 4 % EX LIQD: 60.0000 mL | Freq: Once | CUTANEOUS | Status: DC

## 2014-05-02 MED ORDER — DIPHENHYDRAMINE HCL 50 MG/ML IJ SOLN
INTRAMUSCULAR | Status: AC
  Filled 2014-05-02: qty 1

## 2014-05-02 MED ORDER — 0.9 % SODIUM CHLORIDE (POUR BTL) OPTIME
TOPICAL | Status: DC | PRN
  Administered 2014-05-02: 2000 mL

## 2014-05-02 MED ORDER — LORAZEPAM BOLUS VIA INFUSION
0.5000 mg | Freq: Four times a day (QID) | INTRAVENOUS | Status: DC | PRN
  Filled 2014-05-02: qty 1

## 2014-05-02 MED ORDER — ONDANSETRON HCL 4 MG/2ML IJ SOLN
INTRAMUSCULAR | Status: AC
  Filled 2014-05-02: qty 2

## 2014-05-02 MED ORDER — PROPOFOL 10 MG/ML IV BOLUS
INTRAVENOUS | Status: AC
  Filled 2014-05-02: qty 20

## 2014-05-02 MED ORDER — CISATRACURIUM BESYLATE 20 MG/10ML IV SOLN
INTRAVENOUS | Status: AC
  Filled 2014-05-02: qty 10

## 2014-05-02 MED ORDER — NEOSTIGMINE METHYLSULFATE 10 MG/10ML IV SOLN
INTRAVENOUS | Status: AC
  Filled 2014-05-02: qty 1

## 2014-05-02 MED ORDER — ACETAMINOPHEN 10 MG/ML IV SOLN
1000.0000 mg | Freq: Four times a day (QID) | INTRAVENOUS | Status: AC
  Administered 2014-05-02 – 2014-05-03 (×4): 1000 mg via INTRAVENOUS
  Filled 2014-05-02 (×5): qty 100

## 2014-05-02 MED ORDER — MIDAZOLAM HCL 5 MG/5ML IJ SOLN
INTRAMUSCULAR | Status: DC | PRN
  Administered 2014-05-02 (×2): 1 mg via INTRAVENOUS

## 2014-05-02 MED ORDER — HYDROMORPHONE HCL 1 MG/ML IJ SOLN
0.2500 mg | INTRAMUSCULAR | Status: DC | PRN
  Administered 2014-05-02 (×4): 0.5 mg via INTRAVENOUS

## 2014-05-02 MED ORDER — ONDANSETRON HCL 4 MG/2ML IJ SOLN
INTRAMUSCULAR | Status: DC | PRN
  Administered 2014-05-02: 4 mg via INTRAVENOUS

## 2014-05-02 MED ORDER — ACETAMINOPHEN 10 MG/ML IV SOLN
1000.0000 mg | Freq: Once | INTRAVENOUS | Status: AC
  Administered 2014-05-02: 1000 mg via INTRAVENOUS
  Filled 2014-05-02: qty 100

## 2014-05-02 MED ORDER — MIDAZOLAM HCL 2 MG/2ML IJ SOLN
INTRAMUSCULAR | Status: AC
  Filled 2014-05-02: qty 2

## 2014-05-02 MED ORDER — CISATRACURIUM BESYLATE (PF) 10 MG/5ML IV SOLN
INTRAVENOUS | Status: DC | PRN
  Administered 2014-05-02: 2 mg via INTRAVENOUS
  Administered 2014-05-02: 8 mg via INTRAVENOUS
  Administered 2014-05-02: 2 mg via INTRAVENOUS

## 2014-05-02 MED ORDER — LACTATED RINGERS IV SOLN
INTRAVENOUS | Status: DC | PRN
  Administered 2014-05-02 (×3): via INTRAVENOUS

## 2014-05-02 MED ORDER — DEXAMETHASONE SODIUM PHOSPHATE 10 MG/ML IJ SOLN
INTRAMUSCULAR | Status: DC | PRN
  Administered 2014-05-02: 10 mg via INTRAVENOUS

## 2014-05-02 MED ORDER — LACTATED RINGERS IV SOLN
INTRAVENOUS | Status: DC
  Administered 2014-05-02: 1000 mL via INTRAVENOUS

## 2014-05-02 SURGICAL SUPPLY — 81 items
APPLIER CLIP 5 13 M/L LIGAMAX5 (MISCELLANEOUS)
APPLIER CLIP ROT 10 11.4 M/L (STAPLE)
APR CLP MED LRG 11.4X10 (STAPLE)
APR CLP MED LRG 5 ANG JAW (MISCELLANEOUS)
BLADE EXTENDED COATED 6.5IN (ELECTRODE) IMPLANT
BLADE HEX COATED 2.75 (ELECTRODE) ×4 IMPLANT
BLADE SURG SZ10 CARB STEEL (BLADE) ×6 IMPLANT
CANISTER SUCTION 2500CC (MISCELLANEOUS) ×2 IMPLANT
CELLS DAT CNTRL 66122 CELL SVR (MISCELLANEOUS) IMPLANT
CLIP APPLIE 5 13 M/L LIGAMAX5 (MISCELLANEOUS) IMPLANT
CLIP APPLIE ROT 10 11.4 M/L (STAPLE) IMPLANT
CLOSURE WOUND 1/2 X4 (GAUZE/BANDAGES/DRESSINGS)
COVER MAYO STAND STRL (DRAPES) ×4 IMPLANT
CUTTER FLEX LINEAR 45M (STAPLE) IMPLANT
DECANTER SPIKE VIAL GLASS SM (MISCELLANEOUS) ×2 IMPLANT
DRAPE LAPAROSCOPIC ABDOMINAL (DRAPES) ×4 IMPLANT
DRAPE SHEET LG 3/4 BI-LAMINATE (DRAPES) IMPLANT
DRAPE WARM FLUID 44X44 (DRAPE) ×4 IMPLANT
DRSG TELFA 3X8 NADH (GAUZE/BANDAGES/DRESSINGS) IMPLANT
ELECT REM PT RETURN 9FT ADLT (ELECTROSURGICAL) ×4
ELECTRODE REM PT RTRN 9FT ADLT (ELECTROSURGICAL) ×2 IMPLANT
FILTER SMOKE EVAC LAPAROSHD (FILTER) IMPLANT
GAUZE SPONGE 4X4 12PLY STRL (GAUZE/BANDAGES/DRESSINGS) ×4 IMPLANT
GLOVE SURG SIGNA 7.5 PF LTX (GLOVE) ×8 IMPLANT
GOWN SPEC L4 XLG W/TWL (GOWN DISPOSABLE) ×6 IMPLANT
GOWN STRL REUS W/ TWL XL LVL3 (GOWN DISPOSABLE) ×6 IMPLANT
GOWN STRL REUS W/TWL LRG LVL3 (GOWN DISPOSABLE) ×2 IMPLANT
GOWN STRL REUS W/TWL XL LVL3 (GOWN DISPOSABLE) ×24
KIT BASIN OR (CUSTOM PROCEDURE TRAY) ×4 IMPLANT
LEGGING LITHOTOMY PAIR STRL (DRAPES) IMPLANT
LIGASURE IMPACT 36 18CM CVD LR (INSTRUMENTS) IMPLANT
LIQUID BAND (GAUZE/BANDAGES/DRESSINGS) IMPLANT
MANIFOLD NEPTUNE II (INSTRUMENTS) ×4 IMPLANT
NS IRRIG 1000ML POUR BTL (IV SOLUTION) ×6 IMPLANT
PAD DRESSING TELFA 3X8 NADH (GAUZE/BANDAGES/DRESSINGS) IMPLANT
PAD TELFA 2X3 NADH STRL (GAUZE/BANDAGES/DRESSINGS) ×2 IMPLANT
PENCIL BUTTON HOLSTER BLD 10FT (ELECTRODE) ×4 IMPLANT
RELOAD 45 VASCULAR/THIN (ENDOMECHANICALS) IMPLANT
RELOAD PROXIMATE 75MM BLUE (ENDOMECHANICALS) ×8 IMPLANT
RELOAD STAPLE 45 2.5 WHT GRN (ENDOMECHANICALS) IMPLANT
RELOAD STAPLE 45 3.5 BLU ETS (ENDOMECHANICALS) IMPLANT
RELOAD STAPLE 60 3.6 BLU REG (STAPLE) ×2 IMPLANT
RELOAD STAPLE 75 3.8 BLU REG (ENDOMECHANICALS) IMPLANT
RELOAD STAPLE TA45 3.5 REG BLU (ENDOMECHANICALS) IMPLANT
RELOAD STAPLER BLUE 60MM (STAPLE) IMPLANT
RETRACTOR WND ALEXIS 18 MED (MISCELLANEOUS) IMPLANT
RTRCTR WOUND ALEXIS 18CM MED (MISCELLANEOUS)
SET IRRIG TUBING LAPAROSCOPIC (IRRIGATION / IRRIGATOR) ×2 IMPLANT
SHEARS HARMONIC ACE PLUS 36CM (ENDOMECHANICALS) IMPLANT
SOLUTION ANTI FOG 6CC (MISCELLANEOUS) ×4 IMPLANT
SPONGE LAP 18X18 X RAY DECT (DISPOSABLE) ×4 IMPLANT
STAPLE ECHEON FLEX 60 POW ENDO (STAPLE) IMPLANT
STAPLER PROXIMATE 75MM BLUE (STAPLE) ×2 IMPLANT
STAPLER RELOAD BLUE 60MM (STAPLE)
STAPLER VISISTAT 35W (STAPLE) IMPLANT
STRIP CLOSURE SKIN 1/2X4 (GAUZE/BANDAGES/DRESSINGS) IMPLANT
SUCTION POOLE TIP (SUCTIONS) ×4 IMPLANT
SUT MON AB 5-0 PS2 18 (SUTURE) ×6 IMPLANT
SUT NOVA NAB DX-16 0-1 5-0 T12 (SUTURE) ×4 IMPLANT
SUT PDS AB 1 CT1 27 (SUTURE) ×8 IMPLANT
SUT PROLENE 2 0 KS (SUTURE) IMPLANT
SUT PROLENE 2 0 SH DA (SUTURE) IMPLANT
SUT SILK 2 0 (SUTURE) ×4
SUT SILK 2 0 SH CR/8 (SUTURE) ×4 IMPLANT
SUT SILK 2-0 18XBRD TIE 12 (SUTURE) ×2 IMPLANT
SUT SILK 3 0 (SUTURE)
SUT SILK 3 0 SH CR/8 (SUTURE) ×12 IMPLANT
SUT SILK 3-0 18XBRD TIE 12 (SUTURE) IMPLANT
SYR BULB IRRIGATION 50ML (SYRINGE) ×4 IMPLANT
TAPE CLOTH SURG 4X10 WHT LF (GAUZE/BANDAGES/DRESSINGS) ×2 IMPLANT
TOWEL OR 17X26 10 PK STRL BLUE (TOWEL DISPOSABLE) ×4 IMPLANT
TRAY FOLEY CATH 14FRSI W/METER (CATHETERS) ×4 IMPLANT
TRAY LAPAROSCOPIC (CUSTOM PROCEDURE TRAY) ×4 IMPLANT
TROCAR BLADELESS OPT 5 75 (ENDOMECHANICALS) ×4 IMPLANT
TROCAR XCEL 12X100 BLDLESS (ENDOMECHANICALS) IMPLANT
TROCAR XCEL BLUNT TIP 100MML (ENDOMECHANICALS) IMPLANT
TROCAR XCEL NON-BLD 11X100MML (ENDOMECHANICALS) IMPLANT
TROCAR XCEL UNIV SLVE 11M 100M (ENDOMECHANICALS) IMPLANT
TUBING FILTER THERMOFLATOR (ELECTROSURGICAL) ×4 IMPLANT
YANKAUER SUCT BULB TIP 10FT TU (MISCELLANEOUS) ×4 IMPLANT
YANKAUER SUCT BULB TIP NO VENT (SUCTIONS) ×4 IMPLANT

## 2014-05-02 NOTE — Anesthesia Preprocedure Evaluation (Addendum)
Anesthesia Evaluation  Patient identified by MRN, date of birth, ID band Patient awake    Reviewed: Allergy & Precautions, H&P , NPO status , Patient's Chart, lab work & pertinent test results  History of Anesthesia Complications (+) PONV and history of anesthetic complications  Airway Mallampati: II  TM Distance: >3 FB Neck ROM: Full   Comment: Cervical disc arthroplasty. Dental no notable dental hx.    Pulmonary neg pulmonary ROS,  breath sounds clear to auscultation  Pulmonary exam normal       Cardiovascular hypertension, Rhythm:Regular Rate:Normal  DVT left upper arm. Xarelto   Neuro/Psych Anxiety negative neurological ROS     GI/Hepatic negative GI ROS, Neg liver ROS,   Endo/Other  negative endocrine ROS  Renal/GU negative Renal ROS  negative genitourinary   Musculoskeletal  (+) Arthritis -,   Abdominal   Peds negative pediatric ROS (+)  Hematology negative hematology ROS (+)   Anesthesia Other Findings   Reproductive/Obstetrics negative OB ROS                            Anesthesia Physical Anesthesia Plan  ASA: II  Anesthesia Plan: General   Post-op Pain Management:    Induction: Intravenous  Airway Management Planned: Oral ETT  Additional Equipment:   Intra-op Plan:   Post-operative Plan: Extubation in OR  Informed Consent: I have reviewed the patients History and Physical, chart, labs and discussed the procedure including the risks, benefits and alternatives for the proposed anesthesia with the patient or authorized representative who has indicated his/her understanding and acceptance.   Dental advisory given  Plan Discussed with: CRNA  Anesthesia Plan Comments:         Anesthesia Quick Evaluation

## 2014-05-02 NOTE — Plan of Care (Signed)
Problem: Consults Goal: General Surgical Patient Education (See Patient Education module for education specifics) Outcome: Completed/Met Date Met:  05/02/14 Goal: Diabetes Guidelines if Diabetic/Glucose > 140 If diabetic or lab glucose is > 140 mg/dl - Initiate Diabetes/Hyperglycemia Guidelines & Document Interventions  Outcome: Not Applicable Date Met:  47/30/85  Problem: Phase I Progression Outcomes Goal: Pain controlled with appropriate interventions Outcome: Completed/Met Date Met:  05/02/14 Goal: Incision/dressings dry and intact Outcome: Completed/Met Date Met:  05/02/14 Goal: Tubes/drains patent Outcome: Not Applicable Date Met:  69/43/70

## 2014-05-02 NOTE — Anesthesia Postprocedure Evaluation (Signed)
  Anesthesia Post-op Note  Patient: [name]  Procedure(s) Performed: Procedure(s) (LRB): LAPAROSCOPIC ASSISTED REVERSAL ILEOSTOMY AND LYSIS OF ADHESIONS (N/A) LYSIS OF ADHESION  Patient Location: PACU  Anesthesia Type: General  Level of Consciousness: awake and alert   Airway and Oxygen Therapy: Patient Spontanous Breathing  Post-op Pain: mild  Post-op Assessment: Post-op Vital signs reviewed, Patient's Cardiovascular Status Stable, Respiratory Function Stable, Patent Airway and No signs of Nausea or vomiting  Last Vitals:  Filed Vitals:   05/02/14 1610  BP: 125/68  Pulse: 76  Temp: 37.2 C  Resp: 12    Post-op Vital Signs: stable   Complications: No apparent anesthesia complications

## 2014-05-02 NOTE — Anesthesia Procedure Notes (Signed)
Procedure Name: Intubation Date/Time: 05/02/2014 12:15 PM Performed by: Ofilia Neas Pre-anesthesia Checklist: Patient identified, Timeout performed, Emergency Drugs available, Suction available and Patient being monitored Patient Re-evaluated:Patient Re-evaluated prior to inductionOxygen Delivery Method: Circle system utilized Preoxygenation: Pre-oxygenation with 100% oxygen Intubation Type: IV induction and Cricoid Pressure applied Ventilation: Mask ventilation without difficulty Laryngoscope Size: Mac and 4 Grade View: Grade I Tube type: Oral Tube size: 7.5 mm Number of attempts: 1 Airway Equipment and Method: Stylet

## 2014-05-02 NOTE — Interval H&P Note (Signed)
History and Physical Interval Note:  05/02/2014 11:15 AM  [name]  has presented today for surgery, with the diagnosis of rectal cancer  The various methods of treatment have been discussed with the patient and family.  [name] eye trouble were "floaters".  She is ready for surgery.  Her husband is here.  After consideration of risks, benefits and other options for treatment, the patient has consented to  Procedure(s): LAPAROSCOPIC ASSISTED REVERSAL ILEOSTOMY (N/A) as a surgical intervention .  [name] history has been reviewed, patient examined, no change in status, stable for surgery.  I have reviewed the patient's chart and labs.  Questions were answered to the patient's satisfaction.     Burma Ketcher H

## 2014-05-02 NOTE — Transfer of Care (Signed)
Immediate Anesthesia Transfer of Care Note  Patient: [name]  Procedure(s) Performed: Procedure(s): LAPAROSCOPIC ASSISTED REVERSAL ILEOSTOMY AND LYSIS OF ADHESIONS (N/A) LYSIS OF ADHESION  Patient Location: PACU  Anesthesia Type:General  Level of Consciousness: awake, alert , oriented and patient cooperative  Airway & Oxygen Therapy: Patient Spontanous Breathing and Patient connected to face mask oxygen  Post-op Assessment: Report given to PACU RN, Post -op Vital signs reviewed and stable and Patient moving all extremities  Post vital signs: Reviewed and stable  Complications: No apparent anesthesia complications

## 2014-05-03 LAB — BASIC METABOLIC PANEL
Anion gap: 9 (ref 5–15)
BUN: 11 mg/dL (ref 6–23)
CO2: 24 meq/L (ref 19–32)
CREATININE: 0.65 mg/dL (ref 0.50–1.10)
Calcium: 8.8 mg/dL (ref 8.4–10.5)
Chloride: 107 mEq/L (ref 96–112)
GFR calc non Af Amer: >90 mL/min (ref >90)
Glucose, Bld: 92 mg/dL (ref 70–99)
Potassium: 4.5 mEq/L (ref 3.7–5.3)
SODIUM: 140 meq/L (ref 137–147)

## 2014-05-03 LAB — CBC
HCT: 37 % (ref 36.0–46.0)
Hemoglobin: 12.3 g/dL (ref 12.0–15.0)
MCH: 31.5 pg (ref 26.0–34.0)
MCHC: 33.2 g/dL (ref 30.0–36.0)
MCV: 94.6 fL (ref 78.0–100.0)
PLATELETS: 158 10*3/uL (ref 150–400)
RBC: 3.91 MIL/uL (ref 3.87–5.11)
RDW: 12.7 % (ref 11.5–15.5)
WBC: 5.4 10*3/uL (ref 4.0–10.5)

## 2014-05-03 MED ORDER — DIPHENHYDRAMINE HCL 50 MG/ML IJ SOLN
25.0000 mg | Freq: Four times a day (QID) | INTRAMUSCULAR | Status: DC | PRN
  Administered 2014-05-03 – 2014-05-04 (×2): 25 mg via INTRAVENOUS
  Filled 2014-05-03: qty 1

## 2014-05-03 MED ORDER — DIPHENHYDRAMINE HCL 50 MG/ML IJ SOLN
INTRAMUSCULAR | Status: AC
  Filled 2014-05-03: qty 1

## 2014-05-03 NOTE — Plan of Care (Signed)
Problem: Phase I Progression Outcomes Goal: OOB as tolerated unless otherwise ordered Outcome: Completed/Met Date Met:  05/03/14     

## 2014-05-03 NOTE — Progress Notes (Signed)
General Surgery Note  LOS: 1 day  POD -  1 Day Post-Op  Assessment/Plan: 1.  LAPAROSCOPIC ASSISTED REVERSAL ILEOSTOMY AND LYSIS OF ADHESIONS, LYSIS OF ADHESION - 05/02/2014 - D. Denver Bentson  Pain at ileostomy site.    Otherwise she seems to be doing well.   2.  Exploratory lap, drainage of pelvic abscess, diverting loop ileostomy, flexible sigmoidoscopy - for leak - 09/13/2013 - D. Neshawn Aird  3.  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 DJD  Has seen Dr. Sherrian Divers  6. Anxiety  7. IUD - stuck  Sees Dr. Susette Racer  8. Indeterminate 6 mm liver lesion seen on CT scan  9. History of HELLP syndrome with first pregnancy  10. Urinary incontinence/retention  Dr. Chauncey Cruel. [facility] sees her  66. Perineal numbness. Remote history of back surgery.  12. DVT prophylaxis - SQ Heparin   Active Problems:   History of ileostomy  Subjective:  Sore, but doing okay.  Husband at bedside.  She has been up walking in the hall.  Objective:   Filed Vitals:   05/03/14 0554  BP: 97/58  Pulse:   Temp:   Resp:      Intake/Output from previous day:  11/13 0701 - 11/14 0700 In: 3408.3 [I.V.:3208.3; IV Piggyback:200] Out: 2130 [Urine:2100; Drains:30]  Intake/Output this shift:      Physical Exam:   General: WN WF who is alert and oriented.    HEENT: Normal. Pupils equal. .   Lungs: Clear.  IS approx 1,200 cc   Abdomen: Soft.  Very rare BS.   Wound: Covered.  Port sites okay.     Lab Results:    Recent Labs  05/03/14 0541  WBC 5.4  HGB 12.3  HCT 37.0  PLT 158    BMET   Recent Labs  05/03/14 0541  NA 140  K 4.5  CL 107  CO2 24  GLUCOSE 92  BUN 11  CREATININE 0.65  CALCIUM 8.8    PT/INR  No results for input(s): LABPROT, INR in the last 72 hours.  ABG  No results for input(s): PHART,  HCO3 in the last 72 hours.  Invalid input(s): PCO2, PO2   Studies/Results:  No results found.   Anti-infectives:   Anti-infectives    Start     Dose/Rate Route Frequency Ordered Stop   05/02/14 0915  cefoTEtan (CEFOTAN) 2 g in dextrose 5 % 50 mL IVPB     2 g100 mL/hr over 30 Minutes Intravenous On call to O.R. 05/02/14 0857 05/02/14 1200      [name], MD, FACS Pager: [facility]: [phone] 05/03/2014

## 2014-05-03 NOTE — Plan of Care (Signed)
Problem: Phase II Progression Outcomes Goal: Vital signs stable Outcome: Completed/Met Date Met:  05/03/14

## 2014-05-03 NOTE — Plan of Care (Signed)
Problem: Phase II Progression Outcomes Goal: Progress activity as tolerated unless otherwise ordered Outcome: Completed/Met Date Met:  05/03/14 Goal: Surgical site without signs of infection Outcome: Completed/Met Date Met:  05/03/14 Goal: Dressings dry/intact Outcome: Completed/Met Date Met:  05/03/14 Goal: Sutures/staples intact Outcome: Completed/Met Date Met:  05/03/14

## 2014-05-03 NOTE — Plan of Care (Signed)
Problem: Phase I Progression Outcomes Goal: Initial discharge plan identified Outcome: Completed/Met Date Met:  05/03/14 Goal: Vital signs/hemodynamically stable Outcome: Completed/Met Date Met:  05/03/14

## 2014-05-03 NOTE — Op Note (Signed)
NAMEFELICA, CHARGOIS NO.:  [id number]  MEDICAL RECORD NO.:  [id number]  LOCATION:  37                         FACILITY:  [facility]  PHYSICIAN:  Fenton Malling. [name], M.D.  DATE OF BIRTH:  Nov 01, 1960  DATE OF PROCEDURE:  05/02/2014                               OPERATIVE REPORT   PREOPERATIVE DIAGNOSIS:  Status post resection for colon cancer, loop ileostomy.  POSTOPERATIVE DIAGNOSIS:  Status post resection for colon cancer, loop ileostomy, multiple small bowel adhesions to anterior abdominal wall and pelvis.  PROCEDURES:  Laparoscopic-assisted reversal of ileostomy, enterolysis of adhesions (approximately 50 minutes).  SURGEON:  Fenton Malling. [name], M.D.  FIRST ASSISTANT:  Dr. Ralene Ok.  ANESTHESIA:  General endotracheal.  ESTIMATED BLOOD LOSS:  Less than 100 mL.  DRAINS:  Drains left in were two Telfa wicks in the ileostomy wound.  COMPLICATION:  None.  INDICATION FOR PROCEDURE:  Ms. Moulin is a 53 year old white female, who sees Dr. Evette Cristal, her primary care doctor.  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  She went to adjuvant chemotherapy and radiation therapy.  Had a resection by Dr. [name] on September 02, 2013.  She unfortunately had a leak postop, required a drain of her pelvis and a diverting loop ileostomy on September 13, 2013.  So, she is now some over 7 months removed from her surgery, she has done well.  Her barium enema shows patent lumen with no evidence of extravasation, now comes for reversal of her ileostomy.  The indications and potential complications of surgery were explained to the patient.  Potential complications include bleeding, infection, leak from the bowel, and other problems with her reversal of ileostomy.  OPERATIVE NOTE:  She was taken to room #1 at [facility].  She had both her arms tucked.  She had a Foley catheter in place and had a general anesthesia supervised  by Dr. Franne Grip.  A time-out was held and the surgical checklist run.  I accessed her abdominal cavity through the left upper quadrant with a 5- mm Optiview trocar.  I placed two additional trocars: a left mid abdomen 5-mm trocar and a left lower quadrant 5-mm trocar.  In entering the abdomen, she had evidence of adhesions of small bowel to the anterior abdominal wall midline and down to the pelvis.  I spent about 50 minutes taking these adhesions down from the anterior abdominal wall and mobilizing adhesions out of the pelvis.  I was able to mobilize all these adhesions except for 1 loop of small bowel that was stuck towards the right pelvis and uterus.  I took photos of this adhesion and left them in the chart.  I thought trying to get this loop of small bowel up would cause more damage than leaving it alone.  I then turned my attention to the ileostomy.  I took down as much of the ileostomy as I could intraabdominally.  I did take a picture of the ileostomy prior to detachment.  I then made an elliptical incision around the ileostomy and cut down through the abdominal cavity and got the whole ileostomy mobilized.  I was able to  pull the loop ileostomy up into the wound.  Some of the adhesions I had taken down were close to this anastomosis and I did an handsewn anastomosis.  So, I divided the ileostomy, I then did end-to-end ileo-ileal anastomosis with interrupted 3-0 silk sutures.  This produced an opening about the tip of my finger, which would be a little more than a centimeter.  I probed the edge, and there was no evidence of any leak.  The bowel looked viable.  I closed the mesenteric defect and then dropped the anastomosis back into the abdominal cavity.  I then closed the fascial defect longitudinally with interrupted #1 PDS suture.  I then irrigated the wound thoroughly and put her about 20 mL of 0.25% Marcaine as a local anesthetic, and used additional 10 mL of Marcaine in the  subcutaneous tissues down the skin at the other trocar sites.  I then re-laparoscoped the patient and looked where the fascial wall being close and looked at the anastomosis, which looked good.  There was no bleeding.  I irrigated the abdomen out with about a liter of saline.  I tried to pull the omentum down so it covered the undersurface of the midline incision and then removed the trocars.  The trocar sites were closed with 5-0 Monocryl, covered with Dermabond. The ileostomy site in the right lower quadrant was closed with staples and then Telfa wicks placed between the staples and then sterilely dressed.  The patient tolerated the procedure well.  Sponge and needle count were correct at the end of the case.   Fenton Malling. [name], M.D., FACS   DHN/MEDQ  D:  05/02/2014  T:  05/03/2014  Job:  [id number]  cc:   Mayme Genta, M.D. Fax: [phone]  Reece Packer, MD Fax: [phone]  Ladell Pier, M.D. Fax: [phone]  W. Evette Cristal, M.D. Fax: [phone]  Jodelle Gross, M.D., Ph.D. Fax: [phone]

## 2014-05-03 NOTE — Plan of Care (Signed)
Problem: Phase II Progression Outcomes Goal: Progressing with IS, TCDB Outcome: Completed/Met Date Met:  05/03/14

## 2014-05-04 LAB — BASIC METABOLIC PANEL
ANION GAP: 11 (ref 5–15)
BUN: 6 mg/dL (ref 6–23)
CHLORIDE: 105 meq/L (ref 96–112)
CO2: 24 mEq/L (ref 19–32)
Calcium: 8.6 mg/dL (ref 8.4–10.5)
Creatinine, Ser: 0.6 mg/dL (ref 0.50–1.10)
GFR calc Af Amer: >90 mL/min (ref >90)
GFR calc non Af Amer: >90 mL/min (ref >90)
Glucose, Bld: 84 mg/dL (ref 70–99)
POTASSIUM: 4 meq/L (ref 3.7–5.3)
Sodium: 140 mEq/L (ref 137–147)

## 2014-05-04 LAB — CBC
HEMATOCRIT: 36.2 % (ref 36.0–46.0)
HEMOGLOBIN: 12 g/dL (ref 12.0–15.0)
MCH: 31.6 pg (ref 26.0–34.0)
MCHC: 33.1 g/dL (ref 30.0–36.0)
MCV: 95.3 fL (ref 78.0–100.0)
Platelets: 122 10*3/uL — ABNORMAL LOW (ref 150–400)
RBC: 3.8 MIL/uL — ABNORMAL LOW (ref 3.87–5.11)
RDW: 12.6 % (ref 11.5–15.5)
WBC: 3.9 10*3/uL — AB (ref 4.0–10.5)

## 2014-05-04 MED ORDER — HYDROMORPHONE HCL 1 MG/ML IJ SOLN
0.5000 mg | INTRAMUSCULAR | Status: DC | PRN
  Administered 2014-05-04 – 2014-05-05 (×9): 1 mg via INTRAVENOUS
  Filled 2014-05-04 (×10): qty 1

## 2014-05-04 MED ORDER — SODIUM CHLORIDE 0.9 % IJ SOLN: 10.0000 mL | INTRAMUSCULAR | Status: DC | PRN

## 2014-05-04 NOTE — Plan of Care (Signed)
Problem: Phase I Progression Outcomes Goal: Sutures/staples intact Outcome: Progressing

## 2014-05-04 NOTE — Progress Notes (Signed)
General Surgery Note  LOS: 2 days  POD -  2 Days Post-Op  Assessment/Plan: 1.  LAPAROSCOPIC ASSISTED REVERSAL ILEOSTOMY AND LYSIS OF ADHESIONS, LYSIS OF ADHESION - 05/02/2014 - D. Melisia Leming  Pain at ileostomy site.    Otherwise she seems to be doing well.  Will change to dilaudid for pain.   2.  Exploratory lap, drainage of pelvic abscess, diverting loop ileostomy, flexible sigmoidoscopy - for leak - 09/13/2013 - D. Carmelle Bamberg  3.  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 DJD  Has seen Dr. Sherrian Divers  6. Anxiety  7. IUD - stuck  Sees Dr. Susette Racer  8. Indeterminate 6 mm liver lesion seen on CT scan  9. History of HELLP syndrome with first pregnancy  10. Urinary incontinence/retention  Dr. Chauncey Cruel. MacDiarmid[facility] sees her  77. Perineal numbness. Remote history of back surgery.  12. DVT prophylaxis - SQ Heparin   Active Problems:   History of ileostomy  Subjective:  Sore, but doing okay.  She has been up walking in the hall.  Objective:   Filed Vitals:   05/04/14 0522  BP: 111/74  Pulse: 83  Temp: 98.5 F (36.9 C)  Resp: 15     Intake/Output from previous day:  11/14 0701 - 11/15 0700 In: 3000 [I.V.:3000] Out: 2350 [Urine:2350]  Intake/Output this shift:  Total I/O In: -  Out: 200 [Urine:200]   Physical Exam:   General: WN WF who is alert and oriented.    HEENT: Normal. Pupils equal. .   Lungs: Clear.     Abdomen: Soft.  Very rare BS.   Wound: Covered.  Port sites okay.     Lab Results:     Recent Labs  05/03/14 0541 05/04/14 0704  WBC 5.4 3.9*  HGB 12.3 12.0  HCT 37.0 36.2  PLT 158 122*    BMET    Recent Labs  05/03/14 0541 05/04/14 0704  NA 140 140  K 4.5 4.0  CL 107 105  CO2 24 24  GLUCOSE 92 84  BUN 11 6  CREATININE 0.65 0.60  CALCIUM 8.8 8.6     PT/INR  No results for input(s): LABPROT, INR in the last 72 hours.  ABG  No results for input(s): PHART, HCO3 in the last 72 hours.  Invalid input(s): PCO2, PO2   Studies/Results:  No results found.   Anti-infectives:   Anti-infectives    Start     Dose/Rate Route Frequency Ordered Stop   05/02/14 0915  cefoTEtan (CEFOTAN) 2 g in dextrose 5 % 50 mL IVPB     2 g100 mL/hr over 30 Minutes Intravenous On call to O.R. 05/02/14 0857 05/02/14 1200      [name], MD, FACS Pager: [facility] Office: [phone] 05/04/2014

## 2014-05-04 NOTE — Plan of Care (Signed)
Problem: Phase I Progression Outcomes Goal: Voiding-avoid urinary catheter unless indicated Outcome: Completed/Met Date Met:  05/04/14  Problem: Phase II Progression Outcomes Goal: Foley discontinued Outcome: Completed/Met Date Met:  05/04/14  Problem: Phase III Progression Outcomes Goal: Voiding independently Outcome: Completed/Met Date Met:  05/04/14 Goal: Demonstrates TCDB, IS independently Outcome: Completed/Met Date Met:  05/04/14

## 2014-05-04 NOTE — Plan of Care (Signed)
Problem: Phase III Progression Outcomes Goal: Nasogastric tube discontinued Outcome: Not Applicable Date Met:  98/28/67

## 2014-05-05 ENCOUNTER — Encounter (HOSPITAL_COMMUNITY): Payer: Self-pay | Admitting: Surgery

## 2014-05-05 MED ORDER — ALPRAZOLAM 0.5 MG PO TABS
0.5000 mg | ORAL_TABLET | Freq: Two times a day (BID) | ORAL | Status: DC | PRN
  Administered 2014-05-05 – 2014-05-07 (×3): 0.5 mg via ORAL
  Filled 2014-05-05 (×3): qty 1

## 2014-05-05 MED ORDER — SERTRALINE HCL 50 MG PO TABS
50.0000 mg | ORAL_TABLET | Freq: Every day | ORAL | Status: DC
  Administered 2014-05-06 – 2014-05-07 (×2): 50 mg via ORAL
  Filled 2014-05-05 (×3): qty 1

## 2014-05-05 MED ORDER — OXYCODONE-ACETAMINOPHEN 5-325 MG PO TABS
1.0000 | ORAL_TABLET | ORAL | Status: DC | PRN
Start: 2014-05-05 — End: 2014-05-08
  Administered 2014-05-05: 1 via ORAL
  Administered 2014-05-06 – 2014-05-08 (×10): 2 via ORAL
  Filled 2014-05-05: qty 2
  Filled 2014-05-05: qty 1
  Filled 2014-05-05 (×9): qty 2

## 2014-05-05 NOTE — Progress Notes (Signed)
General Surgery Note  LOS: 3 days  POD -  3 Days Post-Op  Assessment/Plan: 1.  LAPAROSCOPIC ASSISTED REVERSAL ILEOSTOMY AND LYSIS OF ADHESIONS, LYSIS OF ADHESION - 05/02/2014 - D. Jannine Abreu  Pain better controlled with dilaudid.  To start sips from the floor.   2.  Exploratory lap, drainage of pelvic abscess, diverting loop ileostomy, flexible sigmoidoscopy - for leak - 09/13/2013 - D. Chayson Charters  3.  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 DJD  Has seen Dr. Sherrian Divers  6. Anxiety  7. IUD - stuck  Sees Dr. Susette Racer  8. Indeterminate 6 mm liver lesion seen on CT scan  9. History of HELLP syndrome with first pregnancy  10. Urinary incontinence/retention  Dr. Chauncey Cruel. MacDiarmid[facility] sees her  35. Perineal numbness. Remote history of back surgery.  12. DVT prophylaxis - SQ Heparin   Active Problems:   History of ileostomy  Subjective:  Still sore, but sounds like pain is under better control.  She had a small BM.  Objective:   Filed Vitals:   05/05/14 0604  BP: 122/74  Pulse: 75  Temp: 98.3 F (36.8 C)  Resp: 16     Intake/Output from previous day:  11/15 0701 - 11/16 0700 In: 3000 [I.V.:3000] Out: 500 [Urine:500]  Intake/Output this shift:      Physical Exam:   General: WN WF who is alert and oriented.    HEENT: Normal. Pupils equal. .   Lungs: Clear.     Abdomen: Soft.  Has BS.   Wound: Wicks out of ostomy.     Lab Results:     Recent Labs  05/03/14 0541 05/04/14 0704  WBC 5.4 3.9*  HGB 12.3 12.0  HCT 37.0 36.2  PLT 158 122*    BMET    Recent Labs  05/03/14 0541 05/04/14 0704  NA 140 140  K 4.5 4.0  CL 107 105  CO2 24 24  GLUCOSE 92 84  BUN 11 6  CREATININE 0.65 0.60  CALCIUM 8.8 8.6    PT/INR  No results for input(s): LABPROT, INR in the last 72  hours.  ABG  No results for input(s): PHART, HCO3 in the last 72 hours.  Invalid input(s): PCO2, PO2   Studies/Results:  No results found.   Anti-infectives:   Anti-infectives    Start     Dose/Rate Route Frequency Ordered Stop   05/02/14 0915  cefoTEtan (CEFOTAN) 2 g in dextrose 5 % 50 mL IVPB     2 g100 mL/hr over 30 Minutes Intravenous On call to O.R. 05/02/14 0857 05/02/14 1200      [name], MD, FACS Pager: [facility] Office: [phone] 05/05/2014

## 2014-05-05 NOTE — Progress Notes (Signed)
CARE MANAGEMENT NOTE 05/05/2014  Patient:  BRETT, SOZA   Account Number:  [id number]  Date Initiated:  05/05/2014  Documentation initiated by:  [facility]  Subjective/Objective Assessment:   53 Y/O F ADMITTED W/RECTAL [id number].     Action/Plan:   FROM HOME.   Anticipated DC Date:  05/07/2014   Anticipated DC Plan:  [place]  CM consult      Choice offered to / List presented to:             Status of service:  In process, will continue to follow Medicare Important Message given?   (If response is "NO", the following Medicare IM given date fields will be blank) Date Medicare IM given:   Medicare IM given by:   Date Additional Medicare IM given:   Additional Medicare IM given by:    Discharge Disposition:    Per UR Regulation:  Reviewed for med. necessity/level of care/duration of stay  If discussed at Long Length of Stay Meetings, dates discussed:    Comments:  05/05/14 Marchelle Rinella RN,BSN NCM [phone] POD#3 REVERSE ILEOSTOMY, & LOA.NO ANTICIPATED D/C NEEDS.

## 2014-05-06 NOTE — Plan of Care (Signed)
Problem: Phase II Progression Outcomes Goal: Pain controlled Outcome: Completed/Met Date Met:  05/06/14

## 2014-05-06 NOTE — Plan of Care (Signed)
Problem: Phase I Progression Outcomes Goal: Sutures/staples intact Outcome: Completed/Met Date Met:  05/06/14

## 2014-05-06 NOTE — Plan of Care (Signed)
Problem: Phase II Progression Outcomes Goal: Return of bowel function (flatus, BM) IF ABDOMINAL SURGERY:  Outcome: Completed/Met Date Met:  05/06/14

## 2014-05-06 NOTE — Progress Notes (Signed)
General Surgery Note  LOS: 4 days  POD -  4 Days Post-Op  Assessment/Plan: 1.  LAPAROSCOPIC ASSISTED REVERSAL ILEOSTOMY AND LYSIS OF ADHESIONS, LYSIS OF ADHESION - 05/02/2014 - D. Knox Holdman  Having some BM's, tolerated sips.  To advance to clear liq   2.  Exploratory lap, drainage of pelvic abscess, diverting loop ileostomy, flexible sigmoidoscopy - for leak - 09/13/2013 - D. Toya Palacios  3.  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 DJD  Has seen Dr. Sherrian Divers  6. Anxiety  7. IUD - stuck  Sees Dr. Susette Racer  8. Indeterminate 6 mm liver lesion seen on CT scan  9. History of HELLP syndrome with first pregnancy  10. Urinary incontinence/retention  Dr. Chauncey Cruel. MacDiarmid[facility] sees her  72. Perineal numbness. Remote history of back surgery.  12. DVT prophylaxis - SQ Heparin   Active Problems:   History of ileostomy  Subjective:  Had some BM's.  No nausea.  Progressing well.  Pain seems better controlled.  Husband in room  Objective:   Filed Vitals:   05/06/14 0620  BP: 112/55  Pulse: 68  Temp: 98.7 F (37.1 C)  Resp: 18     Intake/Output from previous day:  11/16 0701 - 11/17 0700 In: 3000 [I.V.:3000] Out: 2952 [Urine:2950; Stool:2]  Intake/Output this shift:      Physical Exam:   General: WN WF who is alert and oriented.    HEENT: Normal. Pupils equal. .   Lungs: Clear.     Abdomen: Soft.  Has BS.   Wound: dressing over wounds..     Lab Results:     Recent Labs  05/04/14 0704  WBC 3.9*  HGB 12.0  HCT 36.2  PLT 122*    BMET    Recent Labs  05/04/14 0704  NA 140  K 4.0  CL 105  CO2 24  GLUCOSE 84  BUN 6  CREATININE 0.60  CALCIUM 8.6    PT/INR  No results for input(s): LABPROT, INR in the last 72 hours.  ABG  No results for input(s): PHART, HCO3 in the last  72 hours.  Invalid input(s): PCO2, PO2   Studies/Results:  No results found.   Anti-infectives:   Anti-infectives    Start     Dose/Rate Route Frequency Ordered Stop   05/02/14 0915  cefoTEtan (CEFOTAN) 2 g in dextrose 5 % 50 mL IVPB     2 g100 mL/hr over 30 Minutes Intravenous On call to O.R. 05/02/14 0857 05/02/14 1200      [name], MD, FACS Pager: [place] [facility] Office: [phone] 05/06/2014

## 2014-05-07 MED ORDER — HEPARIN SOD (PORK) LOCK FLUSH 100 UNIT/ML IV SOLN
500.0000 [IU] | INTRAVENOUS | Status: DC
  Filled 2014-05-07: qty 5

## 2014-05-07 MED ORDER — HEPARIN SOD (PORK) LOCK FLUSH 100 UNIT/ML IV SOLN
500.0000 [IU] | INTRAVENOUS | Status: DC | PRN
  Administered 2014-05-08: 500 [IU]
  Filled 2014-05-07 (×2): qty 5

## 2014-05-07 NOTE — Progress Notes (Signed)
General Surgery Note  LOS: 5 days  POD -  5 Days Post-Op  Assessment/Plan: 1.  LAPAROSCOPIC ASSISTED REVERSAL ILEOSTOMY AND LYSIS OF ADHESIONS, LYSIS OF ADHESION - 05/02/2014 - D. Athalia Setterlund  Having BM's  Tolerated liquids, to advance to reg diet.   2.  Exploratory lap, drainage of pelvic abscess, diverting loop ileostomy, flexible sigmoidoscopy - for leak - 09/13/2013 - D. Jazen Spraggins  3.  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 DJD  Has seen Dr. Sherrian Divers  6. Anxiety  7. IUD - stuck  Sees Dr. Susette Racer  8. Indeterminate 6 mm liver lesion seen on CT scan  9. History of HELLP syndrome with first pregnancy  10. Urinary incontinence/retention  Dr. Chauncey Cruel. MacDiarmid's sees her  73. Perineal numbness. Remote history of back surgery.  12. DVT prophylaxis - SQ Heparin   Active Problems:   History of ileostomy  Subjective:  Doing okay.  Ready for more food.  Husband in room  Objective:   Filed Vitals:   05/07/14 0615  BP: 107/78  Pulse: 66  Temp: 98 F (36.7 C)  Resp: 18     Intake/Output from previous day:  11/17 0701 - 11/18 0700 In: 1797.5 [P.O.:720; I.V.:1077.5] Out: 3728 [Urine:2925; Stool:803]  Intake/Output this shift:      Physical Exam:   General: WN WF who is alert and oriented.    HEENT: Normal. Pupils equal. .   Lungs: Clear.     Abdomen: Soft.  Has BS.   Wound: Clean.     Lab Results:    No results for input(s): WBC, HGB, HCT, PLT in the last 72 hours.  BMET   No results for input(s): NA, K, CL, CO2, GLUCOSE, BUN, CREATININE, CALCIUM in the last 72 hours.  PT/INR  No results for input(s): LABPROT, INR in the last 72 hours.  ABG  No results for input(s): PHART, HCO3 in the last 72 hours.  Invalid input(s): PCO2, PO2   Studies/Results:  No results  found.   Anti-infectives:   Anti-infectives    Start     Dose/Rate Route Frequency Ordered Stop   05/02/14 0915  cefoTEtan (CEFOTAN) 2 g in dextrose 5 % 50 mL IVPB     2 g100 mL/hr over 30 Minutes Intravenous On call to O.R. 05/02/14 0857 05/02/14 1200      [name], MD, FACS Pager: [place] [facility] Office: [phone] 05/07/2014

## 2014-05-08 MED ORDER — OXYCODONE HCL 5 MG PO TABS: 5.0000 mg | ORAL_TABLET | ORAL | Status: DC | PRN

## 2014-05-08 NOTE — Care Management Note (Signed)
    Page 1 of 1   05/08/2014     4:34:23 PM CARE MANAGEMENT NOTE 05/08/2014  Patient:  [name], [name]   Account Number:  [id number]  Date Initiated:  05/05/2014  Documentation initiated by:  Dessa Phi  Subjective/Objective Assessment:   53 Y/O F ADMITTED W/RECTAL CA.EQ:[id number].     Action/Plan:   FROM HOME.   Anticipated DC Date:  05/08/2014   Anticipated DC Plan:  [facility]  CM consult      Choice offered to / List presented to:             Status of service:  Completed, signed off Medicare Important Message given?   (If response is "NO", the following Medicare IM given date fields will be blank) Date Medicare IM given:   Medicare IM given by:   Date Additional Medicare IM given:   Additional Medicare IM given by:    Discharge Disposition:  HOME/SELF CARE  Per UR Regulation:  Reviewed for med. necessity/level of care/duration of stay  If discussed at [place] of Stay Meetings, dates discussed:   05/08/2014    Comments:  05/08/14 Stephanee Barcomb RN,BSN NCM [phone] D/C HOME NO [place].  05/05/14 Salome Hautala RN,BSN NCM [phone] POD#3 REVERSE ILEOSTOMY, & LOA.NO ANTICIPATED D/C NEEDS.

## 2014-05-08 NOTE — Discharge Instructions (Signed)
[facility] - DISCHARGE INSTRUCTIONS TO PATIENT  Activity:  Driving - May drive in 3 or 4 days, if doing well   Lifting - No lifting > 15 pounds for 3 weeks, then no limit  Wound Care:   May shower.  Dress right lower quadrant wound as necessary.  Diet:  As tolerated.  Follow up appointment:  Call Dr. Pollie Friar office [facility]) at [phone] for an appointment in 1 week to see nurse and 2 weeks to see Dr. [name]  Medications and dosages:  Resume your home medications.  You have a prescription for:  Oxycodone  Call Dr. [name] or his office  [phone]) if you have:  Temperature greater than 100.4,  Persistent nausea and vomiting,  Severe uncontrolled pain,  Redness, tenderness, or signs of infection (pain, swelling, redness, odor or green/yellow discharge around the site),  Difficulty breathing, headache or visual disturbances,  Any other questions or concerns you may have after discharge.  In an emergency, call 911 or go to an Emergency Department at a nearby hospital.

## 2014-05-08 NOTE — Progress Notes (Signed)
Nurse reviewed discharge instructions with pt and her husband.  Pt verbalized understanding of discharge instructions, follow up appointments and new medications.  No concerns at time of discharge.

## 2014-05-08 NOTE — Discharge Summary (Signed)
Physician Discharge Summary  Patient ID:  [name]  MRN: [id number]  DOB/AGE: [birth year] 53 y.o.  Admit date: 05/02/2014 Discharge date: 05/08/2014  Discharge Diagnoses:  1.  Ileostomy functioning - for reversal of ileostomy 2.  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 Margins clear. Drs Benay Spice and Lisbeth Renshaw are oncology  3. Exploratory lap, drainage of pelvic abscess, diverting loop ileostomy, flexible sigmoidoscopy - for leak - 09/13/2013 - D. Cashmere Harmes   4.Left subclavian DVT  Resume Xarelto   Has left subclavian power port - will plan removal in 4 to 8 weeks. 5. DJD  Has seen Dr. Sherrian Divers  6. Anxiety  7. IUD - stuck  Sees Dr. Susette Racer  8. Indeterminate 6 mm liver lesion seen on CT scan  9. History of HELLP syndrome with first pregnancy  10. Urinary incontinence/retention  Dr. Chauncey Cruel. [facility] sees her  25. Perineal numbness. Remote history of back surgery.  12.  Seroma at RLQ wound site.   Active Problems:   History of ileostomy  Operation: Procedure(s):  LAPAROSCOPIC ASSISTED REVERSAL ILEOSTOMY AND LYSIS OF ADHESIONS on 05/02/2014 - D. [name]  Discharged Condition: good  Hospital Course: [name] is an 53 y.o. female whose primary care physician is NEAL,W RONALD, MD and who was admitted 05/02/2014 with a chief complaint of loop ileostomy.  She has been treated for rectal cancer and is now ready to reverse the ileostomy. She was brought to the operating room on 05/02/2014 and underwent  LAPAROSCOPIC ASSISTED REVERSAL ILEOSTOMY AND LYSIS OF ADHESIONS. Post op she has done well. She started showing bowel function about the 4th post op day.  Her diet was advanced.  She is tolerating regular diet.  She is having BM's though she has some rawness around her rectum. She also has a seroma at the ileostomy site which has drained a  little. She is ready to go home.  The discharge instructions were reviewed with the patient.  Consults: None  Significant Diagnostic Studies: Results for orders placed or performed during the hospital encounter of 05/02/14  CBC  Result Value Ref Range   WBC 5.4 4.0 - 10.5 K/uL   RBC 3.91 3.87 - 5.11 MIL/uL   Hemoglobin 12.3 12.0 - 15.0 g/dL   HCT 37.0 36.0 - 46.0 %   MCV 94.6 78.0 - 100.0 fL   MCH 31.5 26.0 - 34.0 pg   MCHC 33.2 30.0 - 36.0 g/dL   RDW 12.7 11.5 - 15.5 %   Platelets 158 150 - 400 K/uL  Basic metabolic panel  Result Value Ref Range   Sodium 140 137 - 147 mEq/L   Potassium 4.5 3.7 - 5.3 mEq/L   Chloride 107 96 - 112 mEq/L   CO2 24 19 - 32 mEq/L   Glucose, Bld 92 70 - 99 mg/dL   BUN 11 6 - 23 mg/dL   Creatinine, Ser 0.65 0.50 - 1.10 mg/dL   Calcium 8.8 8.4 - 10.5 mg/dL   GFR calc non Af Amer >90 >90 mL/min   GFR calc Af Amer >90 >90 mL/min   Anion gap 9 5 - 15  CBC  Result Value Ref Range   WBC 3.9 (L) 4.0 - 10.5 K/uL   RBC 3.80 (L) 3.87 - 5.11 MIL/uL   Hemoglobin 12.0 12.0 - 15.0 g/dL   HCT 36.2 36.0 - 46.0 %   MCV 95.3 78.0 - 100.0 fL   MCH 31.6 26.0 -  34.0 pg   MCHC 33.1 30.0 - 36.0 g/dL   RDW 12.6 11.5 - 15.5 %   Platelets 122 (L) 150 - 400 K/uL  Basic metabolic panel  Result Value Ref Range   Sodium 140 137 - 147 mEq/L   Potassium 4.0 3.7 - 5.3 mEq/L   Chloride 105 96 - 112 mEq/L   CO2 24 19 - 32 mEq/L   Glucose, Bld 84 70 - 99 mg/dL   BUN 6 6 - 23 mg/dL   Creatinine, Ser 0.60 0.50 - 1.10 mg/dL   Calcium 8.6 8.4 - 10.5 mg/dL   GFR calc non Af Amer >90 >90 mL/min   GFR calc Af Amer >90 >90 mL/min   Anion gap 11 5 - 15    No results found.  Discharge Exam:  Filed Vitals:   05/08/14 0600  BP: 109/65  Pulse: 65  Temp: 97.8 F (36.6 C)  Resp: 18    General: WN WF who is alert.  Lungs: Clear to auscultation and symmetric breath sounds. Heart:  RRR. No murmur or rub. Abdomen: Soft.  Normal bowel sounds. Her incisions look okay.  I  did remove one staple from the RLQ wound.    Discharge Medications:     Medication List    TAKE these medications        acyclovir 400 MG tablet  Commonly known as:  ZOVIRAX  Take 1 tablet (400 mg total) by mouth 2 (two) times daily.     ALPRAZolam 0.5 MG tablet  Commonly known as:  XANAX  Take 0.5 mg by mouth at bedtime as needed for sleep.     ALPRAZolam 0.5 MG tablet  Commonly known as:  XANAX  TAKE 1 TABLET BY MOUTH AT BEDTIME AS NEEDED     estradiol 0.05 MG/24HR patch  Commonly known as:  VIVELLE-DOT  Place 1 patch onto the skin 2 (two) times a week. Sundays and Wednesday     gabapentin 300 MG capsule  Commonly known as:  NEURONTIN  TAKE ONE CAPSULE BY MOUTH THREE TIMES DAILY     lidocaine-prilocaine cream  Commonly known as:  EMLA  Apply 1 application topically.     nitrofurantoin 100 MG capsule  Commonly known as:  MACRODANTIN  Take 1 capsule (100 mg total) by mouth daily.     ondansetron 4 MG tablet  Commonly known as:  ZOFRAN  Take 1 tablet (4 mg total) by mouth every 8 (eight) hours as needed for nausea or vomiting.     ondansetron 8 MG disintegrating tablet  Commonly known as:  ZOFRAN ODT  Take 1 tablet (8 mg total) by mouth every 8 (eight) hours as needed for nausea or vomiting.     oxyCODONE 5 MG immediate release tablet  Commonly known as:  Oxy IR/ROXICODONE  Take 1-2 tablets (5-10 mg total) by mouth every 4 (four) hours as needed for moderate pain, severe pain or breakthrough pain.     PRESCRIPTION MEDICATION  Place 1 each vaginally 2 (two) times a week.     prochlorperazine 10 MG tablet  Commonly known as:  COMPAZINE  Take 1 tablet (10 mg total) by mouth every 6 (six) hours as needed for nausea or vomiting.     PROGESTERONE PO  Take 1 capsule by mouth at bedtime.     rivaroxaban 20 MG Tabs tablet  Commonly known as:  XARELTO  Take 1 tablet (20 mg total) by mouth daily with supper.     sertraline 50  MG tablet  Commonly known as:  ZOLOFT   Take 50 mg by mouth every morning.     sertraline 50 MG tablet  Commonly known as:  ZOLOFT  Take 50 mg by mouth every morning.        Disposition: 01-Home or Self Care      Discharge Instructions    Diet - low sodium heart healthy    Complete by:  As directed      Increase activity slowly    Complete by:  As directed           Activity:  Driving - May drive in 3 or 4 days, if doing well   Lifting - No lifting > 15 pounds for 3 weeks, then no limit  Wound Care:   May shower.  Dress right lower quadrant wound as necessary.  Diet:  As tolerated.  Follow up appointment:  Call Dr. Pollie Friar office [facility]) at [phone] for an appointment in 1 week to see nurse and 2 weeks to see Dr. [name]  Medications and dosages:  Resume your home medications.  You have a prescription for:  Oxycodone             Resume Xarelto  Signed: [name], M.D., [facility] Office:  [phone]  05/08/2014, 7:59 AM

## 2014-05-09 ENCOUNTER — Other Ambulatory Visit: Payer: Self-pay | Admitting: *Deleted

## 2014-05-09 DIAGNOSIS — C20 Malignant neoplasm of rectum: Secondary | ICD-10-CM

## 2014-05-09 MED ORDER — NITROFURANTOIN MACROCRYSTAL 100 MG PO CAPS: 100.0000 mg | ORAL_CAPSULE | Freq: Every morning | ORAL | Status: DC

## 2014-05-09 NOTE — Telephone Encounter (Signed)
Per Dr. Benay Spice; notified pt that per requested Macrodantin re-fill sent to her pharmacy.  Pt verbalized understanding and expressed appreciation for call back.

## 2014-06-02 ENCOUNTER — Ambulatory Visit (HOSPITAL_COMMUNITY)
Admission: RE | Admit: 2014-06-02 | Discharge: 2014-06-02 | Disposition: A | Discharge status: Home / Self Care | Payer: 59 | Source: Ambulatory Visit | Attending: Nurse Practitioner | Admitting: Nurse Practitioner

## 2014-06-02 ENCOUNTER — Other Ambulatory Visit (HOSPITAL_COMMUNITY): Payer: 59

## 2014-06-02 ENCOUNTER — Other Ambulatory Visit (HOSPITAL_BASED_OUTPATIENT_CLINIC_OR_DEPARTMENT_OTHER): Payer: 59

## 2014-06-02 ENCOUNTER — Encounter (HOSPITAL_COMMUNITY): Payer: Self-pay

## 2014-06-02 DIAGNOSIS — M47894 Other spondylosis, thoracic region: Secondary | ICD-10-CM | POA: Insufficient documentation

## 2014-06-02 DIAGNOSIS — N134 Hydroureter: Secondary | ICD-10-CM | POA: Diagnosis not present

## 2014-06-02 DIAGNOSIS — C20 Malignant neoplasm of rectum: Secondary | ICD-10-CM | POA: Insufficient documentation

## 2014-06-02 DIAGNOSIS — M5137 Other intervertebral disc degeneration, lumbosacral region: Secondary | ICD-10-CM | POA: Diagnosis not present

## 2014-06-02 DIAGNOSIS — J984 Other disorders of lung: Secondary | ICD-10-CM | POA: Insufficient documentation

## 2014-06-02 LAB — COMPREHENSIVE METABOLIC PANEL (CC13)
ALBUMIN: 3.4 g/dL — AB (ref 3.5–5.0)
ALT: 18 U/L (ref 0–55)
ANION GAP: 8 meq/L (ref 3–11)
AST: 16 U/L (ref 5–34)
Alkaline Phosphatase: 69 U/L (ref 40–150)
BUN: 12.2 mg/dL (ref 7.0–26.0)
CALCIUM: 8.9 mg/dL (ref 8.4–10.4)
CO2: 26 mEq/L (ref 22–29)
Chloride: 104 mEq/L (ref 98–109)
Creatinine: 0.7 mg/dL (ref 0.6–1.1)
EGFR: >90 mL/min/{1.73_m2} (ref >90)
Glucose: 83 mg/dl (ref 70–140)
Potassium: 4 mEq/L (ref 3.5–5.1)
SODIUM: 138 meq/L (ref 136–145)
Total Bilirubin: 0.36 mg/dL (ref 0.20–1.20)
Total Protein: 6.5 g/dL (ref 6.4–8.3)

## 2014-06-02 MED ORDER — IOHEXOL 300 MG/ML  SOLN
100.0000 mL | Freq: Once | INTRAMUSCULAR | Status: AC | PRN
  Administered 2014-06-02: 100 mL via INTRAVENOUS

## 2014-06-03 LAB — CEA: CEA: 2.2 ng/mL (ref 0.0–5.0)

## 2014-06-04 NOTE — Progress Notes (Signed)
Pt know where to come-bring list meds and ins card, id

## 2014-06-05 ENCOUNTER — Ambulatory Visit (HOSPITAL_BASED_OUTPATIENT_CLINIC_OR_DEPARTMENT_OTHER): Payer: 59 | Admitting: Oncology

## 2014-06-05 ENCOUNTER — Telehealth: Payer: Self-pay | Admitting: Oncology

## 2014-06-05 VITALS — BP 117/77 | HR 86 | Temp 98.3°F | Resp 18 | Ht 64.0 in | Wt 147.2 lb

## 2014-06-05 DIAGNOSIS — Z85048 Personal history of other malignant neoplasm of rectum, rectosigmoid junction, and anus: Secondary | ICD-10-CM

## 2014-06-05 DIAGNOSIS — C20 Malignant neoplasm of rectum: Secondary | ICD-10-CM

## 2014-06-05 NOTE — Progress Notes (Signed)
[facility] OFFICE PROGRESS NOTE   Diagnosis: Rectal cancer  INTERVAL HISTORY:   She returns as scheduled. She underwent an ileostomy reversal procedure on 05/03/2014. She reports having frequent bowel movements. The bowel movements are occasionally painful. Numbness persist at the perineum. She has numbness and tingling in the fingers and toes. The hands are painful at times. Mrs. Murfin is currently experiencing significant stress related to the mental illness of her son. She would like to see the [facility] counseling service.  Objective:  Vital signs in last 24 hours:  Blood pressure 117/77, pulse 86, temperature 98.3 F (36.8 C), temperature source Oral, resp. rate 18, height 5\' 4"  (1.626 m), weight 147 lb 3.2 oz (66.769 kg), SpO2 98 %.    HEENT: Neck without mass Lymphatics: No cervical, supra-clavicular, axillary, or inguinal nodes Resp: Lungs clear bilaterally Cardio: Regular rate and rhythm GI: No hepatomegaly, nontender, no mass Vascular: No leg edema Neuro: Mild decrease in vibratory sense at the fingertips bilaterally     Portacath/PICC-without erythema  Lab Results:  Lab Results  Component Value Date   WBC 3.9* 05/04/2014   HGB 12.0 05/04/2014   HCT 36.2 05/04/2014   MCV 95.3 05/04/2014   PLT 122* 05/04/2014   NEUTROABS 6.0 03/06/2014      Lab Results  Component Value Date   CEA 2.2 06/02/2014    Imaging:  Ct Chest W Contrast  06/02/2014   CLINICAL DATA:  Rectal cancer  EXAM: CT CHEST, ABDOMEN, AND PELVIS WITH CONTRAST  TECHNIQUE: Multidetector CT imaging of the chest, abdomen and pelvis was performed following the standard protocol during bolus administration of intravenous contrast.  CONTRAST:  154mL OMNIPAQUE IOHEXOL 300 MG/ML  SOLN  COMPARISON:  CT chest 06/11/2013 and CT abdomen pelvis from 09/23/2013  FINDINGS: CT CHEST FINDINGS  Mediastinum: Normal heart size. There is no pericardial effusion. The trachea appears patent  and is midline. Normal appearance of the esophagus.No mediastinal or hilar adenopathy.  Lungs/Pleura: No pleural effusion identified. There is no airspace consolidation or atelectasis. Scarring noted within both lung bases. No suspicious pulmonary nodule or mass identified.  Musculoskeletal: Spondylosis noted within the thoracic spine. No aggressive lytic or sclerotic bone lesion.  CT ABDOMEN AND PELVIS FINDINGS  Hepatobiliary: Stable 5 mm low attenuation structure within segment 7 of the liver, image 44/series 2. Gallbladder appears normal. No biliary dilatation.  Pancreas: Normal appearance of the pancreas.  Spleen: The spleen is normal.  Adrenals/Urinary Tract: The adrenal glands are both normal. Normal appearance of the right kidney. There is left pelvocaliectasis which appears similar to previous exam. No high-grade obstructive uropathy. Left hydroureter is noted into the pelvis. The urinary bladder appears normal.  Stomach/Bowel: The stomach appears normal. The small bowel loops have a normal course and caliber without evidence for bowel obstruction. Normal appearance of the colon. Post therapeutic changes are identified within the pelvis and involving the rectum. Presacral gas and fluid collection has decreased in size from previous exam. On today's study this measured 1.5 x 0.7 x 2.5 cm. This appears to communicate with the posterior wall of the rectum by a small sinus tract, image 67/series 603. No specific findings identified to suggest residual/recurrence of tumor.  Vascular/Lymphatic: Normal appearance of the abdominal aorta. No enlarged retroperitoneal or mesenteric adenopathy. No enlarged pelvic or inguinal lymph nodes.  Reproductive: The uterus contains an IUD and appears normal. No adnexal mass noted.  Other: No free fluid or fluid collections identified.  Musculoskeletal: No aggressive lytic  or sclerotic bone lesions identified. Degenerative disc disease is again noted at L4-5 and L5-S1.   IMPRESSION: 1. No acute findings identified within the chest, abdomen or pelvis. No specific features are identified to suggest residual/recurrence of tumor or metastatic disease. 2. Small gas collection within the presacral soft tissues has decreased in volume from previous exam. Fistulous communication within the rectum is again identified.   Electronically Signed   By: Kerby Moors M.D.   On: 06/02/2014 09:43   Ct Abdomen Pelvis W Contrast  06/02/2014   CLINICAL DATA:  Rectal cancer  EXAM: CT CHEST, ABDOMEN, AND PELVIS WITH CONTRAST  TECHNIQUE: Multidetector CT imaging of the chest, abdomen and pelvis was performed following the standard protocol during bolus administration of intravenous contrast.  CONTRAST:  160mL OMNIPAQUE IOHEXOL 300 MG/ML  SOLN  COMPARISON:  CT chest 06/11/2013 and CT abdomen pelvis from 09/23/2013  FINDINGS: CT CHEST FINDINGS  Mediastinum: Normal heart size. There is no pericardial effusion. The trachea appears patent and is midline. Normal appearance of the esophagus.No mediastinal or hilar adenopathy.  Lungs/Pleura: No pleural effusion identified. There is no airspace consolidation or atelectasis. Scarring noted within both lung bases. No suspicious pulmonary nodule or mass identified.  Musculoskeletal: Spondylosis noted within the thoracic spine. No aggressive lytic or sclerotic bone lesion.  CT ABDOMEN AND PELVIS FINDINGS  Hepatobiliary: Stable 5 mm low attenuation structure within segment 7 of the liver, image 44/series 2. Gallbladder appears normal. No biliary dilatation.  Pancreas: Normal appearance of the pancreas.  Spleen: The spleen is normal.  Adrenals/Urinary Tract: The adrenal glands are both normal. Normal appearance of the right kidney. There is left pelvocaliectasis which appears similar to previous exam. No high-grade obstructive uropathy. Left hydroureter is noted into the pelvis. The urinary bladder appears normal.  Stomach/Bowel: The stomach appears normal. The  small bowel loops have a normal course and caliber without evidence for bowel obstruction. Normal appearance of the colon. Post therapeutic changes are identified within the pelvis and involving the rectum. Presacral gas and fluid collection has decreased in size from previous exam. On today's study this measured 1.5 x 0.7 x 2.5 cm. This appears to communicate with the posterior wall of the rectum by a small sinus tract, image 67/series 603. No specific findings identified to suggest residual/recurrence of tumor.  Vascular/Lymphatic: Normal appearance of the abdominal aorta. No enlarged retroperitoneal or mesenteric adenopathy. No enlarged pelvic or inguinal lymph nodes.  Reproductive: The uterus contains an IUD and appears normal. No adnexal mass noted.  Other: No free fluid or fluid collections identified.  Musculoskeletal: No aggressive lytic or sclerotic bone lesions identified. Degenerative disc disease is again noted at L4-5 and L5-S1.  IMPRESSION: 1. No acute findings identified within the chest, abdomen or pelvis. No specific features are identified to suggest residual/recurrence of tumor or metastatic disease. 2. Small gas collection within the presacral soft tissues has decreased in volume from previous exam. Fistulous communication within the rectum is again identified.   Electronically Signed   By: Kerby Moors M.D.   On: 06/02/2014 09:43    Medications: I have reviewed the patient's current medications.  Assessment/Plan: 1. Rectal cancer, clinical stage III (T3 N1) status post biopsy of a rectal mass on 05/24/2013 confirming adenocarcinoma. Endoscopic ultrasound 05/31/2013 measured the mass at 11 cm from the anal verge, uT3uN1.  Initiation of radiation and concurrent Xeloda 06/18/2013; completion 07/26/2013.   Status post low anterior resection 09/02/2013. Invasive adenocarcinoma, 3.7 cm, negative margins; metastatic carcinoma in  7 of 11 lymph nodes; extensive lymph vascular involvement by  tumor.   Cycle 1 adjuvant FOLFOX 10/15/2013.   Cycle 2 adjuvant FOLFOX 10/29/2013.   Cycle 3 adjuvant FOLFOX 11/14/2013.   Cycle 4 adjuvant FOLFOX 11/27/2013.   Cycle 5 adjuvant FOLFOX 12/10/2013   Cycle 6 adjuvant FOLFOX 12/24/2013.   Cycle 7 adjuvant FOLFOX 01/14/2014.   Cycle 8 adjuvant FOLFOX 01/28/2014   Cycle 9 adjuvant FOLFOX 02/11/2014 (oxaliplatin dose reduced secondary to thrombocytopenia)   Surveillance CT scans 06/02/2014 with no evidence of recurrent rectal cancer 2. Post colonoscopy bowel perforation confirmed on abdomen CT 05/28/2013. 3. Single indeterminate liver lesion on the abdomen CT 05/28/2013. MRI on 06/18/2013 confirmed 2 tiny liver lesions, indeterminate-favored to be benign cysts. 4. History of cervical and lumbar disc disease.  5. History of HELLP syndrome with first pregnancy.  6. Delayed nausea following cycle 1 FOLFOX. She receives Aloxi and Emend. She continues to experience delayed nausea. Prophylactic Decadron added with cycle 4. 7. History of pain with bowel movements. 8. Anastomotic leak on CT 09/13/2013. Status post exploratory laparotomy with drainage of a pelvic abscess, diverting loop ileostomy. Drainage catheter removed 10/23/2013.  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 Perineal numbness, urinary retention. The urinary retention has improved. 10. Perineal pain-improved. 11. Neutropenia secondary chemotherapy-she will receive Neulasta with the next cycle of chemotherapy 12. Urinary tract infection, Escherichia coli-11/03/2013. 13. Venous engorgement at the left anterior chest and left arm with swelling of the left arm and hand. Venous Doppler 11/14/2013 positive for DVT. She began Lovenox 11/14/2013 and was switched to xarelto 12/10/2013. 14. Fever 11/20/2013. Blood and urine cultures negative. The fever resolved within  24 hours. She continued to have a single episode of fever following chemotherapy 15. History of a herpetic lip lesion 16. 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 17. Escherichia coli bacteremia, urinary source, 01/14/2014. She completed a 10 day course of ceftriaxone. 18. Oxaliplatin progressive following the completion of chemotherapy, now painful 19. Anxiety/depression    Disposition:  She is in clinical remission from rectal cancer. She will return for an office visit in 6 weeks.  Mrs. Balzarini is scheduled for removal of the Port-A-Cath 06/09/2014. She will discontinue xarelto prior to this procedure with the plan to resume xarelto the day following the procedure. The Xarelto will then be discontinued after 2 weeks. She will contact us for increased edema in the left arm.  I discussed the case with Dr. [name]. He will consider further evaluation of the gas collection and possible fistula noted on the recent CT.  Ms. Pina will discontinue Zoloft and began a trial of Cymbalta for depression and neuropathy. She is referred to the [facility] psychology service.  Betsy Coder, MD  06/05/2014  10:08 AM

## 2014-06-05 NOTE — Progress Notes (Signed)
Increased anxiety related to son having issues-has made contact with Education officer, museum and chaplain.

## 2014-06-05 NOTE — Telephone Encounter (Signed)
Gave avs & cal for Jan. °

## 2014-06-06 ENCOUNTER — Other Ambulatory Visit: Payer: Self-pay | Admitting: *Deleted

## 2014-06-06 ENCOUNTER — Encounter: Payer: Self-pay | Admitting: *Deleted

## 2014-06-06 MED ORDER — DULOXETINE HCL 60 MG PO CPEP: 60.0000 mg | ORAL_CAPSULE | Freq: Every day | ORAL | Status: DC

## 2014-06-06 NOTE — Telephone Encounter (Signed)
Called pt with instructions to discontinue Zoloft and begin Cymbalta. She voiced understanding.

## 2014-06-06 NOTE — Progress Notes (Signed)
[place] Work  Clinical Social Work was referred by nurse for assessment of psychosocial needs due to issues with her son and his mental illness.  Clinical Social Worker phoned patient to offer support and assess for needs.  Pt reports son is IVC'd currently and they have plan in place. He appears to finally be getting supports in place to assist with his persistent mental illness. CSW allowed pt time to process concerns over the phone and validated her emotions. CSW encouraged pt to look for her own counselor in the community in order to further process this issue. CSW provided her with several names to follow up with. Pt appreciated call, aware to reach out as needed.   Clinical Social Work interventions: Emotional support Resource education  Loren Racer, [place] Worker [place]  [place] Phone: [phone] Fax: [phone]

## 2014-06-09 ENCOUNTER — Encounter (HOSPITAL_BASED_OUTPATIENT_CLINIC_OR_DEPARTMENT_OTHER): Payer: Self-pay

## 2014-06-09 ENCOUNTER — Ambulatory Visit (HOSPITAL_BASED_OUTPATIENT_CLINIC_OR_DEPARTMENT_OTHER)
Admission: RE | Admit: 2014-06-09 | Discharge: 2014-06-09 | Disposition: A | Discharge status: Home / Self Care | Payer: 59 | Source: Ambulatory Visit | Attending: Surgery | Admitting: Surgery

## 2014-06-09 ENCOUNTER — Encounter (HOSPITAL_BASED_OUTPATIENT_CLINIC_OR_DEPARTMENT_OTHER)
Admission: RE | Disposition: A | Discharge status: Home / Self Care | Payer: Self-pay | Source: Ambulatory Visit | Attending: Surgery

## 2014-06-09 DIAGNOSIS — F419 Anxiety disorder, unspecified: Secondary | ICD-10-CM | POA: Insufficient documentation

## 2014-06-09 DIAGNOSIS — C20 Malignant neoplasm of rectum: Secondary | ICD-10-CM | POA: Diagnosis not present

## 2014-06-09 DIAGNOSIS — D696 Thrombocytopenia, unspecified: Secondary | ICD-10-CM | POA: Insufficient documentation

## 2014-06-09 DIAGNOSIS — Z452 Encounter for adjustment and management of vascular access device: Secondary | ICD-10-CM | POA: Diagnosis present

## 2014-06-09 DIAGNOSIS — Z86718 Personal history of other venous thrombosis and embolism: Secondary | ICD-10-CM | POA: Diagnosis not present

## 2014-06-09 DIAGNOSIS — R339 Retention of urine, unspecified: Secondary | ICD-10-CM | POA: Insufficient documentation

## 2014-06-09 HISTORY — PX: PORT-A-CATH REMOVAL: SHX5289

## 2014-06-09 SURGERY — MINOR REMOVAL PORT-A-CATH
Anesthesia: LOCAL | Site: Chest | Laterality: Left

## 2014-06-09 MED ORDER — LIDOCAINE-EPINEPHRINE (PF) 1 %-1:200000 IJ SOLN
INTRAMUSCULAR | Status: AC
  Filled 2014-06-09: qty 20

## 2014-06-09 MED ORDER — LIDOCAINE-EPINEPHRINE (PF) 1 %-1:200000 IJ SOLN
INTRAMUSCULAR | Status: DC | PRN
  Administered 2014-06-09: 18 mL

## 2014-06-09 MED ORDER — OXYCODONE HCL 5 MG PO TABS: 5.0000 mg | ORAL_TABLET | Freq: Four times a day (QID) | ORAL | Status: DC | PRN

## 2014-06-09 MED ORDER — LIDOCAINE-EPINEPHRINE 1 %-1:100000 IJ SOLN
INTRAMUSCULAR | Status: AC
  Filled 2014-06-09: qty 1

## 2014-06-09 MED ORDER — POVIDONE-IODINE 10 % EX OINT
TOPICAL_OINTMENT | CUTANEOUS | Status: AC
  Filled 2014-06-09: qty 56.7

## 2014-06-09 MED ORDER — POVIDONE-IODINE 10 % EX OINT
TOPICAL_OINTMENT | CUTANEOUS | Status: AC
  Filled 2014-06-09: qty 28.35

## 2014-06-09 SURGICAL SUPPLY — 32 items
APL SKNCLS STERI-STRIP NONHPOA (GAUZE/BANDAGES/DRESSINGS)
BANDAGE ADH SHEER 1  50/CT (GAUZE/BANDAGES/DRESSINGS) IMPLANT
BENZOIN TINCTURE PRP APPL 2/3 (GAUZE/BANDAGES/DRESSINGS) IMPLANT
BLADE SURG 15 STRL LF DISP TIS (BLADE) ×1 IMPLANT
BLADE SURG 15 STRL SS (BLADE) ×2
COVER SURGICAL LIGHT HANDLE (MISCELLANEOUS) ×2 IMPLANT
DRAPE UTILITY XL STRL (DRAPES) ×1 IMPLANT
GAUZE SPONGE 4X4 16PLY XRAY LF (GAUZE/BANDAGES/DRESSINGS) ×2 IMPLANT
GLOVE SURG SIGNA 7.5 PF LTX (GLOVE) ×2 IMPLANT
GLOVE SURG SS PI 7.0 STRL IVOR (GLOVE) ×1 IMPLANT
LIQUID BAND (GAUZE/BANDAGES/DRESSINGS) ×2 IMPLANT
MARKER SKIN DUAL TIP RULER LAB (MISCELLANEOUS) ×1 IMPLANT
NDL HYPO 25X1 1.5 SAFETY (NEEDLE) IMPLANT
NDL HYPO 25X5/8 SAFETYGLIDE (NEEDLE) ×1 IMPLANT
NDL SAFETY ECLIPSE 18X1.5 (NEEDLE) ×1 IMPLANT
NEEDLE HYPO 18GX1.5 SHARP (NEEDLE)
NEEDLE HYPO 25X1 1.5 SAFETY (NEEDLE) IMPLANT
NEEDLE HYPO 25X5/8 SAFETYGLIDE (NEEDLE) ×2 IMPLANT
NS IRRIG 1000ML POUR BTL (IV SOLUTION) IMPLANT
SPONGE GAUZE 4X4 12PLY STER LF (GAUZE/BANDAGES/DRESSINGS) IMPLANT
STRIP CLOSURE SKIN 1/2X4 (GAUZE/BANDAGES/DRESSINGS) IMPLANT
STRIP CLOSURE SKIN 1/4X4 (GAUZE/BANDAGES/DRESSINGS) IMPLANT
SUT MNCRL AB 4-0 PS2 18 (SUTURE) ×2 IMPLANT
SUT VIC AB 3-0 SH 27 (SUTURE) ×2
SUT VIC AB 3-0 SH 27X BRD (SUTURE) ×1 IMPLANT
SUT VIC AB 5-0 PS2 18 (SUTURE) IMPLANT
SUT VICRYL 3-0 CR8 SH (SUTURE) IMPLANT
SUT VICRYL 4-0 PS2 18IN ABS (SUTURE) IMPLANT
SWABSTICK POVIDONE IODINE SNGL (MISCELLANEOUS) ×4 IMPLANT
SYR CONTROL 10ML LL (SYRINGE) ×2 IMPLANT
TOWEL OR NON WOVEN STRL DISP B (DISPOSABLE) ×1 IMPLANT
UNDERPAD 30X30 INCONTINENT (UNDERPADS AND DIAPERS) ×1 IMPLANT

## 2014-06-09 NOTE — Op Note (Signed)
06/09/2014  3:29 PM  PATIENT:  [name], 53 y.o., female MRN: [id number] DOB: [birth year]  PREOP DIAGNOSIS:  rectal cancer, power port, completion of chemotherapy  POSTOP DIAGNOSIS:   Rectal cancer, completion of chemotherapy  PROCEDURE:   Procedure(s):  REMOVAL PORT-A-CATH  (MINOR PROCEDURE)   SURGEON:   [name], M.D.  ANESTHESIA:  Local       17 cc of 1%  xylocaine  COUNTS CORRECT:  YES  INDICATIONS FOR PROCEDURE:  [name] is a 53 y.o. (DOB: [birth year]) white  female whose primary care physician is NEAL,W RONALD, MD and comes for removal of a  power port placed for the treatment of rectal cancer.  Dr. Benay Spice is her treating oncologist.   The indications and risks of the surgery were explained to the patient.  The risks include, but are not limited to, infection, bleeding, and nerve injury.  OPERATIVE NOTE:  The patient was taken to Room #3 at [facility].  Her left chest was prepped with chloroprep and sterilely draped.   A time out was held and the surgery checklist reviewed.   The patient was placed in a supine position.  I infiltrated 17 cc of local around the power port.   I made an incision through the prior power port incision.   I cut down to the tubing and removed this intact.  I then removed the port intact.  There was not bleeding.   The wounds were then closed with 3-0 vicryl subcutaneous sutures and the skin closed with a 4-0 Monocryl suture.  The skin was painted with Dermabond.   The patient was transferred to the recovery room in good condition.  The sponge and needle count were correct at the end of the case.   [name], MD, [facility] Pager: [phone] Office phone:  [phone]

## 2014-06-09 NOTE — H&P (Signed)
[name] 05/21/2014 12:25 PM Location: [facility] Patient #: [id number] DOB: 12-16-1960 Married / Language: English / Race: White Female  History of Present Illness:  The patient is a 53 year old female who presents to discuss consultation. She comes with her husband. Her primary care physician is Dr. Dory Hebert. Her gastroenterologist is Dr. Domenic Hebert.  She had a laparoscopic assisted reversal of her ileostomy and lysis of adhesions on 05/02/2014 by Dr. Keturah Hebert. [name]. She has done well since the surgery. She still has numbness around her rectum and has trouble feeling the BM's. She is not sure how much stool she has in the toilet, until she looks. She is having 8 to 10 BM's/day. She went to [employer] the other day and had diarrhea. She did not thnk that she had eaten anything that out of the ordinary. She is still fatigued, tires easily, and still lacks some in appetite. We talked about work. She does not think that she will be ready to return to work 06/21/2014. We decided on targeting 08/19/2014 as a more realistific date of returning to work. 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  She gets updated CT scans around 06/02/2014 and then sees Dr. Jacinto Reap. Hebert. If her scans are okay and [name] agrees, we plan to remove her power port at the end of the month or early next month. She will stop the Xarelto 5 days before the power port removal. One question is how long to stay on the Xarelto after the power port is out. We will go over this with [name]. I have filled out the posting sheet for the power port removal. She will call us back with the results.  She also talked bout her son who just spent 8 days in [facility]. They are trying to get him to go to a facility in [place], but it does not sound like he is cooperating.  History of rectal cancer: [name] had a laparoscopic-assisted low anterior resection  and appendectomy on 09/02/2013 by Dr. [name]. She had an exploratory laparotomy, drainage of pelvic abscess, diverting loop ileostomy for a leak on 09/13/2013 by Dr. [name]. She sees [name] and [name] for oncology. She received neoadjuvant therapy.  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 Her margins were clear. She completed adjuvant chemotherapy with FOLFOX. She has had problems with numbness along her left perineum, urinary retention, left subclavian vein thrombosis, and thrombocytopenia.   She remains on Xarelto for her left subclavian thrombosis. She has seen [name] for urinary retention and incontinence. She is seeing him in a couple of weeks for further evaluation of her bladder. Early after surgery, she saw [name] for her perineal numbness, but has not seen him since then.  Social History: Married. Son has mental issues and has been a problem recently.  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 Mammoplasty; Reduction Bilateral. Resection of Small Bowel Resection of Stomach Spinal Surgery - Lower Back Spinal Surgery -  Neck  Diagnostic Studies History: Colonoscopy within last year Mammogram within last year Pap Smear 1-5 years ago  Allergies Renee Medal, MD; 05/21/2014 12:33 PM) No Known Drug Allergies09/03/2014  Medication History  Renee Medal, MD; 05/21/2014 12:33 PM) OxyCODONE HCl (5MG  Tablet, 1 (one) Tablet Oral every four hours, as needed, Taken starting 05/13/2014) Active. (1 - 2 tabs Every 4 hours for moderate or severe pain.) Xanax XR (0.5MG  Tablet ER 24HR, Oral daily) Active. Zovirax (400MG  Tablet, Oral as needed) Active. Estradiol (0.05MG /24HR Patch TW, Transdermal daily) Active. Gabapentin (300MG  Capsule, Oral daily) Active. Xarelto (20MG  Tablet, Oral daily) Active. Zoloft (50MG  Tablet, Oral daily) Active.  Social History: Alcohol use Moderate alcohol use. Caffeine use Coffee. No drug use Tobacco use Never smoker.  Family History: Colon Polyps Mother. Hypertension Father.  Pregnancy / Birth History: Age at menarche 15 years. Age of menopause 44-50 Contraceptive History Intrauterine device. Gravida 2 Maternal age 80-30 Para 2  Review of Systems: General:  Present- Fatigue and Night Sweats.  Skin: Present- Dryness and Non-Healing Wounds.  HEENT Present- Hearing Loss and Wears glasses/contact lenses.  Respiratory Present- Snoring. Not Present- Bloody sputum, Chronic Cough, Difficulty Breathing and Wheezing.  Cardiovascular Not Present- Chest Pain, Difficulty Breathing Lying Down, Leg Cramps, Palpitations, Rapid Heart Rate, Shortness of Breath and Swelling of Extremities. Gastrointestinal Present- Rectal Pain.  Musculoskeletal Present- Back Pain.  Neurological Present- Numbness, Tingling and Weakness.  Psychiatric Present- Anxiety.  Endocrine Present- Cold Intolerance, Hair Changes and Hot flashes.  Hematology Present- Easy Bruising. .  Vitals Briant Cedar CMA; 05/21/2014 12:27 PM) 05/21/2014 12:26 PM Weight: 147.25 lb Height: 64in Body Surface Area: 1.74 m Body Mass Index: 25.28 kg/m Temp.: 97.59F  Pulse: 83 (Regular)  BP: 110/70 (Sitting, Left Arm, Standard)  Physical Exam: Mental Status-Alert. General Appearance-Not in acute distress. Build &  Nutrition-Well nourished.  Head and Neck Head-normocephalic, atraumatic with no lesions or palpable masses. Neck Global Assessment - no lymphadenopathy, no palpable mass on the right, no palpable mass on the left.  Chest and Lung Exam Chest and lung exam reveals -normal excursion with symmetric chest walls and on auscultation, normal breath sounds, no adventitious sounds and normal vocal resonance.  Cardiovascular Cardiovascular examination reveals -normal heart sounds, regular rate and rhythm with no murmurs.  Abdomen Palpation/Percussion Auscultation Note: She has a right lower quadrant ileostomy. Her midline incision is well-healed. I feel no mass or lesion.  Rectal Note: She has numbness to pinprick along her left perineum going to her left thigh. It seems that about 8 inches from her anus along the thigh she has sensation. She has rectal tone and can squeeze my finger. I think I feel her anastomosis at about 8 cm. It feels open, though fixed to surrounding tissues.  Neurologic Neurologic evaluation reveals -normal coordination. Note: See rectal exam. She has left peroneal and posterior thigh numbness.  Musculoskeletal Normal Exam - Bilateral-Upper Extremity Strength Normal and Lower Extremity Strength Normal. Note: She has mild swelling of her left arm and prominent venous drainage from the arm.  Lymphatic Head & Neck General Head & Neck Lymphatics: Bilateral - Description - Normal.  Assessment & Plan: 1.  HISTORY OF RECTAL CANCER (V10.06  Z85.048)  Impression: For CT scans by [name] in about 10 days.  2.  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  The plan is to remain on Xarelto about 2 weeks post  op, then stop it.  [I discussed this with Dr. Beryle Beams  3.  PERINEAL NUMBNESS (782.0  R20.0) 4.  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  URINARY  RETENTION (788.20  R33.9)  Impression: She is seeing Dr. Worthy Flank in about 2 weeks. 6.  PORT-A-CATH IN PLACE [phone])  Impression: Plan to remove this later this month or early next month.  Will wait on CT scan she is to get mid December and then make sure that [name] does not have any plans from here. CT scan showed no tumor.  But did show "fistulous communication" near anastomosis.  [name], MD, [facility] Pager: [phone] Office phone:  [phone]

## 2014-06-09 NOTE — Interval H&P Note (Signed)
History and Physical Interval Note:  06/09/2014 2:48 PM  [name]  has presented today for surgery, with the diagnosis of rectal cancer, power port  The various methods of treatment have been discussed with the patient and family.  Husband at bedside.  Discussed CT findings with patient.  After consideration of risks, benefits and other options for treatment, the patient has consented to  Procedure(s): REMOVAL PORT-A-CATH  (MINOR PROCEDURE)  (N/A) as a surgical intervention .  [name] history has been reviewed, patient examined, no change in status, stable for surgery.  I have reviewed the patient's chart and labs.  Questions were answered to the patient's satisfaction.     Savyon Loken H

## 2014-06-10 ENCOUNTER — Encounter (HOSPITAL_BASED_OUTPATIENT_CLINIC_OR_DEPARTMENT_OTHER): Payer: Self-pay | Admitting: Surgery

## 2014-06-10 ENCOUNTER — Encounter: Payer: Self-pay | Admitting: Oncology

## 2014-06-10 NOTE — Progress Notes (Signed)
Put [employer] disability form on nurse's desk

## 2014-06-17 ENCOUNTER — Encounter: Payer: Self-pay | Admitting: Oncology

## 2014-06-17 NOTE — Progress Notes (Signed)
Faxed disability form to [place] @ [phone]

## 2014-06-23 ENCOUNTER — Telehealth: Payer: Self-pay | Admitting: *Deleted

## 2014-06-23 NOTE — Telephone Encounter (Signed)
Wishes to extend her disability through March to recover from surgery and chemotherapy. Wants to be sure Dr. Benay Spice sends information they need to extend it. Noted that managed care faxed disability paperwork on 06/17/14

## 2014-06-24 ENCOUNTER — Telehealth: Payer: Self-pay | Admitting: *Deleted

## 2014-06-24 ENCOUNTER — Other Ambulatory Visit (HOSPITAL_BASED_OUTPATIENT_CLINIC_OR_DEPARTMENT_OTHER): Payer: 59

## 2014-06-24 DIAGNOSIS — C20 Malignant neoplasm of rectum: Secondary | ICD-10-CM

## 2014-06-24 DIAGNOSIS — N390 Urinary tract infection, site not specified: Secondary | ICD-10-CM

## 2014-06-24 LAB — URINALYSIS, MICROSCOPIC - CHCC
BILIRUBIN (URINE): NEGATIVE
Blood: NEGATIVE
GLUCOSE UR CHCC: NEGATIVE mg/dL
KETONES: NEGATIVE mg/dL
NITRITE: POSITIVE
PH: 5 (ref 4.6–8.0)
PROTEIN: 30 mg/dL
Specific Gravity, Urine: 1.03 (ref 1.003–1.035)
Urobilinogen, UR: 0.2 mg/dL (ref 0.2–1)

## 2014-06-24 MED ORDER — CIPROFLOXACIN HCL 500 MG PO TABS: 500.0000 mg | ORAL_TABLET | Freq: Two times a day (BID) | ORAL | Status: DC

## 2014-06-24 NOTE — Telephone Encounter (Signed)
Called to report urine looks cloudy, has odor and she is sensitive in perineal area. Last dose macrodantin was about a week ago-was told to try to d/c it. Stools are hard to loose at times-no pattern. She will come in for U/A, culture today.

## 2014-06-24 NOTE — Telephone Encounter (Signed)
Notified that u/a is highly suspicious for UTI. High WBC and bacteria count. Push fluids and start cipro twice daily. Will await urine culture results. Confirmed that she has a urologist-Dr. [name] and has routine follow up with him. Will route report to office.

## 2014-06-26 LAB — URINE CULTURE

## 2014-06-27 ENCOUNTER — Telehealth: Payer: Self-pay | Admitting: *Deleted

## 2014-06-27 NOTE — Telephone Encounter (Signed)
Called pt with urine culture results. Continue Cipro as prescribed. She voiced understanding. Result routed to Dr. McDiarmid.

## 2014-07-09 ENCOUNTER — Other Ambulatory Visit: Payer: Self-pay | Admitting: *Deleted

## 2014-07-09 ENCOUNTER — Telehealth: Payer: Self-pay | Admitting: *Deleted

## 2014-07-09 DIAGNOSIS — C20 Malignant neoplasm of rectum: Secondary | ICD-10-CM

## 2014-07-09 MED ORDER — ACYCLOVIR 400 MG PO TABS: 400.0000 mg | ORAL_TABLET | Freq: Two times a day (BID) | ORAL | Status: DC

## 2014-07-09 MED ORDER — DULOXETINE HCL 60 MG PO CPEP: 60.0000 mg | ORAL_CAPSULE | Freq: Every day | ORAL | Status: DC

## 2014-07-09 NOTE — Telephone Encounter (Signed)
Per Dr. Benay Spice; OK to refill.  Informed triage.

## 2014-07-09 NOTE — Telephone Encounter (Signed)
Pt called and left message requesting refills of  Cymbalta and Acyclovir.  Pt stated she ran out of Cymbalta and did not take any yesterday.  Pt also stated she has started developing cold sores again and would like to start Acyclovir again.  Pt is currently in [place].  Pt is still using [facility]. Pt's  Phone    [phone].

## 2014-07-16 NOTE — Progress Notes (Signed)
Counseling intern met with patient for initial session. Patient was eager to discuss psychological, emotional, and physical implications of her cancer experience.   [facility] Counseling Intern [phone]

## 2014-07-17 ENCOUNTER — Telehealth: Payer: Self-pay | Admitting: Oncology

## 2014-07-17 ENCOUNTER — Ambulatory Visit (HOSPITAL_BASED_OUTPATIENT_CLINIC_OR_DEPARTMENT_OTHER): Payer: 59 | Admitting: Nurse Practitioner

## 2014-07-17 ENCOUNTER — Other Ambulatory Visit: Payer: Self-pay

## 2014-07-17 VITALS — BP 118/80 | HR 97 | Temp 98.8°F | Resp 17 | Ht 64.0 in | Wt 145.1 lb

## 2014-07-17 DIAGNOSIS — R200 Anesthesia of skin: Secondary | ICD-10-CM

## 2014-07-17 DIAGNOSIS — C20 Malignant neoplasm of rectum: Secondary | ICD-10-CM

## 2014-07-17 DIAGNOSIS — F418 Other specified anxiety disorders: Secondary | ICD-10-CM

## 2014-07-17 DIAGNOSIS — D701 Agranulocytosis secondary to cancer chemotherapy: Secondary | ICD-10-CM

## 2014-07-17 DIAGNOSIS — R110 Nausea: Secondary | ICD-10-CM

## 2014-07-17 DIAGNOSIS — M7989 Other specified soft tissue disorders: Secondary | ICD-10-CM

## 2014-07-17 DIAGNOSIS — C779 Secondary and unspecified malignant neoplasm of lymph node, unspecified: Secondary | ICD-10-CM

## 2014-07-17 MED ORDER — ALPRAZOLAM 0.5 MG PO TABS: 0.5000 mg | ORAL_TABLET | Freq: Every evening | ORAL | Status: DC | PRN

## 2014-07-17 NOTE — Progress Notes (Addendum)
[facility] OFFICE PROGRESS NOTE   Diagnosis:  Rectal cancer  INTERVAL HISTORY:   [name] returns as scheduled. She underwent Port-A-Cath removal on 06/09/2014. She continues to note swelling and prominent veins involving the left arm. She denies arm pain. She has persistent numbness at the perineum. She continues to have frequent bowel movements. At time bowel movements are painful and she needs to take an oxycodone. She continues to have numbness and tingling in the fingers and toes. She takes Xanax at nighttime to help with sleep.  Objective:  Vital signs in last 24 hours:  Blood pressure 118/80, pulse 97, temperature 98.8 F (37.1 C), temperature source Oral, resp. rate 17, height 5\' 4"  (1.626 m), weight 145 lb 1.6 oz (65.817 kg), SpO2 97 %.    HEENT: No thrush or ulcers. Resp: Lungs clear bilaterally. Cardio: Regular rate and rhythm. GI: Abdomen soft and nontender. No hepatomegaly. No mass. Vascular: No leg edema. Left arm is larger than the right arm. Venous engorgement noted across the left chest wall and left upper arm. Skin: Small scabbed lesion at the lateral edge of the Port-A-Cath scar.  Lab Results:  Lab Results  Component Value Date   WBC 3.9* 05/04/2014   HGB 12.0 05/04/2014   HCT 36.2 05/04/2014   MCV 95.3 05/04/2014   PLT 122* 05/04/2014   NEUTROABS 6.0 03/06/2014    Imaging:  No results found.  Medications: I have reviewed the patient's current medications.  Assessment/Plan: 1. Rectal cancer, clinical stage III (T3 N1) status post biopsy of a rectal mass on 05/24/2013 confirming adenocarcinoma. Endoscopic ultrasound 05/31/2013 measured the mass at 11 cm from the anal verge, uT3uN1.  Initiation of radiation and concurrent Xeloda 06/18/2013; completion 07/26/2013.   Status post low anterior resection 09/02/2013. Invasive adenocarcinoma, 3.7 cm, negative margins; metastatic carcinoma in 7 of 11 lymph nodes; extensive lymph vascular  involvement by tumor.   Cycle 1 adjuvant FOLFOX 10/15/2013.   Cycle 2 adjuvant FOLFOX 10/29/2013.   Cycle 3 adjuvant FOLFOX 11/14/2013.   Cycle 4 adjuvant FOLFOX 11/27/2013.   Cycle 5 adjuvant FOLFOX 12/10/2013   Cycle 6 adjuvant FOLFOX 12/24/2013.   Cycle 7 adjuvant FOLFOX 01/14/2014.   Cycle 8 adjuvant FOLFOX 01/28/2014   Cycle 9 adjuvant FOLFOX 02/11/2014 (oxaliplatin dose reduced secondary to thrombocytopenia)   Surveillance CT scans 06/02/2014 with no evidence of recurrent rectal cancer 2. Post colonoscopy bowel perforation confirmed on abdomen CT 05/28/2013. 3. Single indeterminate liver lesion on the abdomen CT 05/28/2013. MRI on 06/18/2013 confirmed 2 tiny liver lesions, indeterminate-favored to be benign cysts. 4. History of cervical and lumbar disc disease.  5. History of HELLP syndrome with first pregnancy.  6. Delayed nausea following cycle 1 FOLFOX. She receives Aloxi and Emend. She continues to experience delayed nausea. Prophylactic Decadron added with cycle 4. 7. History of pain with bowel movements. 8. Anastomotic leak on CT 09/13/2013. Status post exploratory laparotomy with drainage of a pelvic abscess, diverting loop ileostomy. Drainage catheter removed 10/23/2013.  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 Perineal numbness, urinary retention. The urinary retention has improved. 10. Perineal pain-improved. 11. Neutropenia secondary chemotherapy-she received Neulasta. 12. Urinary tract infection, Escherichia coli-11/03/2013. 13. Venous engorgement at the left anterior chest and left arm with swelling of the left arm and hand. Venous Doppler 11/14/2013 positive for DVT. She began Lovenox 11/14/2013 and was switched to xarelto 12/10/2013. Xarelto discontinued early January  2016. 14. Fever 11/20/2013. Blood and urine cultures negative. The fever  resolved within 24 hours. She continued to have a single episode of fever following chemotherapy 15. History of a herpetic lip lesion 16. 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 17. Escherichia coli bacteremia, urinary source, 01/14/2014. She completed a 10 day course of ceftriaxone. 18. Oxaliplatin progressive following the completion of chemotherapy, now painful 19. Anxiety/depression. She continues Cymbalta. 20. Port-A-Cath removal 06/09/2014. 21. Escherichia coli urinary tract infection 06/24/2014. She completed a course of ciprofloxacin.   Disposition: Ms. [facility] remains in clinical remission from rectal cancer. She will return for a follow-up visit and CEA in early June 2016. She will contact the office in the interim with any problems. We specifically discussed increased swelling, pain, redness involving the left arm.  Patient seen with Dr. Benay Spice.    Ned Card ANP/GNP-BC   07/17/2014  10:43 AM  This was a shared visit with  [name]. She will contact us for progressive swelling of the left arm. Dr. [name] will follow the pelvic gas/fluid collection.  Renee Manson, MD

## 2014-07-17 NOTE — Telephone Encounter (Signed)
, °

## 2014-07-23 ENCOUNTER — Other Ambulatory Visit: Payer: Self-pay

## 2014-07-23 DIAGNOSIS — C20 Malignant neoplasm of rectum: Secondary | ICD-10-CM

## 2014-07-23 MED ORDER — DULOXETINE HCL 60 MG PO CPEP: 60.0000 mg | ORAL_CAPSULE | Freq: Every day | ORAL | Status: DC

## 2014-07-23 NOTE — Progress Notes (Signed)
Expand All Collapse All   Counseling intern met with patient. Patient was eager to continue discussing psychological, emotional, and physical implications of her cancer experience.   [facility] Counseling Intern [phone]

## 2014-07-23 NOTE — Telephone Encounter (Signed)
Faxed refill request for Xarelto tabs 20 mg for 90 day suppy with note stating that the medication was discontinued in 06-2014.

## 2014-07-24 ENCOUNTER — Encounter: Payer: Self-pay | Admitting: Oncology

## 2014-07-24 NOTE — Progress Notes (Signed)
Faxed clinical information to [place] @ [phone],  extend patient's disability to 09/19/14

## 2014-07-30 NOTE — Progress Notes (Signed)
Counseling intern met with patient. Patient was eager to continue discussing psychological, emotional, and physical implications of her cancer experience. Patient is seeking coping strategies for stress management and reduction.  [name] Counseling Intern [phone]              

## 2014-08-06 NOTE — Progress Notes (Signed)
  Counseling intern met with patient. Patient was eager to continue discussing psychological, emotional, and physical implications of her cancer experience. Patient is seeking coping strategies for stress management and reduction.  [facility] Counseling Intern [phone]

## 2014-08-09 ENCOUNTER — Encounter (HOSPITAL_COMMUNITY): Payer: Self-pay | Admitting: Emergency Medicine

## 2014-08-09 ENCOUNTER — Emergency Department (HOSPITAL_COMMUNITY)
Admission: EM | Admit: 2014-08-09 | Discharge: 2014-08-09 | Disposition: A | Discharge status: Home / Self Care | Payer: 59 | Source: Home / Self Care | Attending: Emergency Medicine | Admitting: Emergency Medicine

## 2014-08-09 DIAGNOSIS — B029 Zoster without complications: Secondary | ICD-10-CM

## 2014-08-09 MED ORDER — VALACYCLOVIR HCL 1 G PO TABS: 1000.0000 mg | ORAL_TABLET | Freq: Three times a day (TID) | ORAL | Status: AC

## 2014-08-09 NOTE — ED Notes (Signed)
Pt states that she has a on her abdomen and back she noticed yesterday morning.

## 2014-08-09 NOTE — Discharge Instructions (Signed)

## 2014-08-09 NOTE — ED Provider Notes (Signed)
CSN: [id number]     Arrival date & time 08/09/14  109 History   First MD Initiated Contact with Patient 08/09/14 1057     No chief complaint on file.  (Consider location/radiation/quality/duration/timing/severity/associated sxs/prior Treatment) HPI 54 year old female presents for evaluation of a rash. This started yesterday morning. She has red bumps around the left side of her stomach that seems to be blistering. She is worried about shingles. No rash elsewhere. No systemic symptoms. No history of shingles.       Past Medical History  Diagnosis Date  . History of radiation therapy 06/18/13-07/26/13    rectal 50.4Gy total dose  . IUD (intrauterine device) in place 08-27-13    Mirena Implant inplace  . Arthritis 08-27-13    at present has ruptured disc- lower back-not an issue now  . PONV (postoperative nausea and vomiting)     scopalamine patch helped  . Hypertension     toxemia with pregnancy 1990  . Anxiety   . UTI (lower urinary tract infection)     hx of   . Blood infection     E Coli after UTI   . DVT (deep venous thrombosis)     left upper extremity currently has stopped xarelto   . Nerve damage     numbness in pelvic area from rectal rection straining to empty bladder   . Stress incontinence   . Colon cancer 08-27-13    radiation /chemo -last ended 4 weeks(Dr. Benay Spice)   Past Surgical History  Procedure Laterality Date  . Back surgery    . Cesarean section      x2  . Cervical disc arthroplasty  01/03/2012    Procedure: CERVICAL ANTERIOR DISC ARTHROPLASTY;  Surgeon: Charlie Pitter, MD;  Location: [facility];  Service: Neurosurgery;  Laterality: N/A;  Cervical Anterior Disc Arthorplasty Five-Six  . Eus N/A 05/31/2013    Procedure: LOWER ENDOSCOPIC ULTRASOUND (EUS);  Surgeon: Beryle Beams, MD;  Location: [facility];  Service: Endoscopy;  Laterality: N/A;  . Intrauterine device insertion  2006    [facility]  . Knee arthroscopy Left 08-27-13    torn meniscus repair left   .  Breast surgery      reduction surgery  . Refractive surgery    . Laparoscopic low anterior resection N/A 09/02/2013    Procedure: LAPAROSCOPIC LOW ANTERIOR RESECTION;  Surgeon: [name], MD;  Location: [facility];  Service: General;  Laterality: N/A;  . Laparotomy N/A 09/13/2013    Procedure: EXPLORATORY LAPAROTOMY / FLEXIBLE SIGMOIDOSCOPY/  DRAINAGE OF PELVIC ABSCESS WITH  DIVERTING  LOOP  ILEOSTOMY;  Surgeon: [name], MD;  Location: [facility];  Service: General;  Laterality: N/A;  . Portacath placement Left 10/11/2013    Procedure: INSERTION PORT-A-CATH;  Surgeon: [name], MD;  Location: [facility];  Service: General;  Laterality: Left;  . Appendectomy    . Bladder study       pressure readings had approx 1.5 months ago   . Ileo loop colostomy closure N/A 05/02/2014    Procedure: LAPAROSCOPIC ASSISTED REVERSAL ILEOSTOMY AND LYSIS OF ADHESIONS;  Surgeon: [name], MD;  Location: [facility];  Service: General;  Laterality: N/A;  . Lysis of adhesion  05/02/2014    Procedure: LYSIS OF ADHESION;  Surgeon: [name], MD;  Location: [facility];  Service: General;;  . Port-a-cath removal Left 06/09/2014    Procedure: REMOVAL PORT-A-CATH  (MINOR PROCEDURE) ;  Surgeon: [name], MD;  Location: [facility];  Service: General;  Laterality: Left;   No family history on file. History  Substance Use Topics  . Smoking status: Never Smoker   . Smokeless tobacco: Never Used  . Alcohol Use: Yes     Comment: weekends    OB History    No data available     Review of Systems  Skin: Positive for rash.  All other systems reviewed and are negative.   Allergies  Review of patient's allergies indicates no known allergies.  Home Medications   Prior to Admission medications   Medication Sig Start Date End Date Taking? Authorizing Provider  acyclovir (ZOVIRAX) 400 MG tablet Take 1 tablet (400 mg total) by mouth 2 (two) times daily. 07/09/14   Ladell Pier, MD  ALPRAZolam  Duanne Moron) 0.5 MG tablet Take 1 tablet (0.5 mg total) by mouth at bedtime as needed. 07/17/14   Owens Shark, NP  DULoxetine (CYMBALTA) 60 MG capsule Take 1 capsule (60 mg total) by mouth daily. 07/23/14   Owens Shark, NP  estradiol (VIVELLE-DOT) 0.05 MG/24HR Place 1 patch onto the skin 2 (two) times a week. Sundays and Wednesday    Historical Provider, MD  hydrochlorothiazide (HYDRODIURIL) 25 MG tablet Take 25 mg by mouth daily. 04/17/14   Historical Provider, MD  nitrofurantoin (MACRODANTIN) 100 MG capsule Take 1 capsule (100 mg total) by mouth every morning. 05/09/14   Ladell Pier, MD  oxyCODONE (OXY IR/ROXICODONE) 5 MG immediate release tablet Take 1-2 tablets (5-10 mg total) by mouth every 4 (four) hours as needed for moderate pain, severe pain or breakthrough pain. 05/08/14   [name], MD  oxyCODONE (OXY IR/ROXICODONE) 5 MG immediate release tablet Take 1-2 tablets (5-10 mg total) by mouth every 6 (six) hours as needed for moderate pain, severe pain or breakthrough pain. 06/09/14   [name], MD  Progesterone Micronized (PROGESTERONE PO) Take 100 capsules by mouth at bedtime.     Historical Provider, MD  valACYclovir (VALTREX) 1000 MG tablet Take 1 tablet (1,000 mg total) by mouth 3 (three) times daily. 08/09/14 08/23/14  Freeman Caldron Shontia Gillooly, PA-C   BP 123/83 mmHg  Pulse 74  Temp(Src) 98.4 F (36.9 C) (Oral)  Resp 73  SpO2 97% Physical Exam  Constitutional: She is oriented to person, place, and time. Vital signs are normal. She appears well-developed and well-nourished. No distress.  HENT:  Head: Normocephalic and atraumatic.  Pulmonary/Chest: Effort normal. No respiratory distress.  Neurological: She is alert and oriented to person, place, and time. She has normal strength. Coordination normal.  Skin: Skin is warm and dry. Rash (erythematous rash in a dermatomal distribution around the left side of the lower abdomen) noted. She is not diaphoretic.  Psychiatric: She has a normal mood  and affect. Judgment normal.  Nursing note and vitals reviewed.   ED Course  Procedures (including critical care time) Labs Review Labs Reviewed - No data to display  Imaging Review No results found.   MDM   1. Shingles    Consistent with shingles. Treat with bowel tracks. She has recent radiation and chemotherapy but she has stopped these months ago, discussed with her signs and symptoms of disseminated zoster, she will return if these types of symptoms occur.   Meds ordered this encounter  Medications  . valACYclovir (VALTREX) 1000 MG tablet    Sig: Take 1 tablet (1,000 mg total) by mouth 3 (three) times daily.    Dispense:  21 tablet    Refill:  0      Liam Graham, PA-C 08/09/14 1140

## 2014-08-11 ENCOUNTER — Telehealth: Payer: Self-pay | Admitting: *Deleted

## 2014-08-11 NOTE — Telephone Encounter (Signed)
Called pt to follow up [place] call over the weekend. Taking Valtrex as prescribed. Pt asking if there is a "shot" she can take. Dr. Benay Spice aware pt is being treated for shingles. Per MD: No indication for shingles vaccine since pt now has shingles. Make sure PCP is aware. Pt voiced understanding.

## 2014-08-20 NOTE — Progress Notes (Signed)
Counseling intern met with patient. Patient was eager to continue discussing psychological, emotional, and physical implications of her cancer experience. Patient is seeking coping strategies for stress management and reduction.  [name] Counseling Intern [phone]              

## 2014-08-25 ENCOUNTER — Other Ambulatory Visit: Payer: Self-pay | Admitting: *Deleted

## 2014-08-25 ENCOUNTER — Telehealth: Payer: Self-pay | Admitting: *Deleted

## 2014-08-25 ENCOUNTER — Other Ambulatory Visit (HOSPITAL_BASED_OUTPATIENT_CLINIC_OR_DEPARTMENT_OTHER): Payer: 59

## 2014-08-25 ENCOUNTER — Telehealth: Payer: Self-pay

## 2014-08-25 ENCOUNTER — Telehealth: Payer: Self-pay | Admitting: Oncology

## 2014-08-25 ENCOUNTER — Other Ambulatory Visit: Payer: Self-pay | Admitting: Nurse Practitioner

## 2014-08-25 DIAGNOSIS — N390 Urinary tract infection, site not specified: Secondary | ICD-10-CM

## 2014-08-25 DIAGNOSIS — R319 Hematuria, unspecified: Principal | ICD-10-CM

## 2014-08-25 DIAGNOSIS — R3989 Other symptoms and signs involving the genitourinary system: Secondary | ICD-10-CM

## 2014-08-25 LAB — URINALYSIS, MICROSCOPIC - CHCC
Bilirubin (Urine): NEGATIVE
Glucose: NEGATIVE mg/dL
Ketones: NEGATIVE mg/dL
NITRITE: NEGATIVE
PH: 6 (ref 4.6–8.0)
Protein: 30 mg/dL
Specific Gravity, Urine: 1.02 (ref 1.003–1.035)
UROBILINOGEN UR: 0.2 mg/dL (ref 0.2–1)

## 2014-08-25 MED ORDER — CIPROFLOXACIN HCL 500 MG PO TABS: 500.0000 mg | ORAL_TABLET | Freq: Two times a day (BID) | ORAL | Status: DC

## 2014-08-25 NOTE — Progress Notes (Signed)
This RN left a message on her cell phone informing her that she could come to [facility] today for labs. Cyndee ordered a UA and C&S based on patient's symptoms. Patient can come today or tomorrow for labs. No appointment was scheduled in Symptom Management because I never spoke directly to the patient. Instructed patient to call [phone] when she listened to my message.

## 2014-08-25 NOTE — Telephone Encounter (Signed)
Spoke to husband on phone about patient's positive results for UTI.  Antibiotic for Cipro 500 called in to [facility].  Urine culture pending.  Husband will give patient information and pick up Rx today.  Told to call if fever or if symptoms worsen.  Verbalized understanding.

## 2014-08-25 NOTE — Telephone Encounter (Signed)
Pt called and left message re: pt has UTI again - cloudy urine, bladder soreness.  Pt wanted to know if she should come in for lab checked.  Called pt back and left message on voice mail requesting a call back from pt. Pt's  Phone    [phone]

## 2014-08-25 NOTE — Telephone Encounter (Signed)
S/w pt confirming labs today per 03/047 POF.... Cherylann Banas

## 2014-08-26 ENCOUNTER — Other Ambulatory Visit: Payer: 59

## 2014-08-27 LAB — URINE CULTURE

## 2014-09-01 ENCOUNTER — Encounter: Payer: Self-pay | Admitting: Oncology

## 2014-09-01 NOTE — Progress Notes (Signed)
I placed [employer] forms LTD on the desk of the nurse for Dr. Benay Spice.

## 2014-09-05 ENCOUNTER — Encounter: Payer: Self-pay | Admitting: Oncology

## 2014-09-05 NOTE — Progress Notes (Signed)
I faxed to [place] [phone] LTD forms for the patient.

## 2014-09-10 NOTE — Progress Notes (Signed)
Counseling intern met with patient. Patient was eager to continue discussing psychological, emotional, and physical implications of her cancer experience. Patient is seeking coping strategies for stress management and reduction.  [name] Counseling Intern [phone]              

## 2014-09-26 ENCOUNTER — Telehealth: Payer: Self-pay

## 2014-09-26 NOTE — Telephone Encounter (Signed)
Pt called asking for referral to benign heme clinic at [facility]. This is related to her history of blood clot and port placement on left. Dr [name], her surgeon does not do referrals so pt is asking Dr Benay Spice.

## 2014-09-26 NOTE — Telephone Encounter (Signed)
Ok to refer to Dr. Joan Flores or colleague in thrombophilia clinic at [facility]

## 2014-09-30 ENCOUNTER — Telehealth: Payer: Self-pay | Admitting: Oncology

## 2014-09-30 NOTE — Telephone Encounter (Signed)
Faxed pt medical records to [facility],  [place] will contact pt with appt.

## 2014-10-08 ENCOUNTER — Telehealth: Payer: Self-pay | Admitting: Oncology

## 2014-10-08 NOTE — Telephone Encounter (Signed)
D'Andera from Dr [facility] office called pt several times to make the appt. Pt has not returned call.

## 2014-12-18 ENCOUNTER — Other Ambulatory Visit (HOSPITAL_BASED_OUTPATIENT_CLINIC_OR_DEPARTMENT_OTHER): Payer: 59

## 2014-12-18 DIAGNOSIS — C20 Malignant neoplasm of rectum: Secondary | ICD-10-CM

## 2014-12-18 LAB — CEA: CEA: 3.1 ng/mL (ref 0.0–5.0)

## 2014-12-19 ENCOUNTER — Telehealth: Payer: Self-pay | Admitting: *Deleted

## 2014-12-19 NOTE — Telephone Encounter (Signed)
Per Dr. Benay Spice; notified pt that cea is normal.  "Yeah!"  Pt verbalized understanding.

## 2014-12-19 NOTE — Telephone Encounter (Signed)
-----   Message from Ladell Pier, MD sent at 12/18/2014  9:05 PM EDT ----- Please call patient, cea is normal

## 2015-01-01 ENCOUNTER — Ambulatory Visit: Payer: 59 | Attending: Obstetrics and Gynecology | Admitting: Physical Therapy

## 2015-01-01 ENCOUNTER — Encounter: Payer: Self-pay | Admitting: Physical Therapy

## 2015-01-01 DIAGNOSIS — N8184 Pelvic muscle wasting: Secondary | ICD-10-CM | POA: Diagnosis present

## 2015-01-01 DIAGNOSIS — M6289 Other specified disorders of muscle: Secondary | ICD-10-CM

## 2015-01-01 NOTE — Patient Instructions (Signed)
Quick Contraction: Gravity Eliminated (Hook-Lying)   Lie with hips and knees bent. Quickly squeeze then fully relax pelvic floor. Perform _5__ sets of _1__. Rest for __1_ seconds between sets. Do _3__ times a day.  Then do rectally Copyright  VHI. All rights reserved.  Slow Contraction: Gravity Eliminated (Hook-Lying)   Lie with hips and knees bent. Slowly squeeze pelvic floor for _5__ seconds. Rest for _5__ seconds. Repeat _5__ times. Do _3__ times a day. Then perform rectally   Copyright  VHI. All rights reserved.  Try voiding every 2 hours. Catherize to see how much urine is available.   Continuing with stretches.  [facility] [address], [place], [place] [address] Phone # [phone] Fax [phone]

## 2015-01-01 NOTE — Therapy (Signed)
[facility] [address] [place], [place], [address] Phone: [phone]   Fax:  [phone]  Physical Therapy Evaluation  Patient Details  Name: [name] MRN: [id number] Date of Birth: May 09, 1961 Referring Provider:  Juanda Chance, NP  Encounter Date: 01/01/2015      PT End of Session - 01/01/15 1123    Visit Number 1   Date for PT Re-Evaluation 03/26/15   PT Start Time 1884   PT Stop Time 1120   PT Time Calculation (min) 65 min   Activity Tolerance Patient tolerated treatment well   Behavior During Therapy [facility] for tasks assessed/performed      Past Medical History  Diagnosis Date  . History of radiation therapy 06/18/13-07/26/13    rectal 50.4Gy total dose  . IUD (intrauterine device) in place 08-27-13    Mirena Implant inplace  . Arthritis 08-27-13    at present has ruptured disc- lower back-not an issue now  . PONV (postoperative nausea and vomiting)     scopalamine patch helped  . Hypertension     toxemia with pregnancy 1990  . Anxiety   . UTI (lower urinary tract infection)     hx of   . Blood infection     E Coli after UTI   . DVT (deep venous thrombosis)     left upper extremity currently has stopped xarelto   . Nerve damage     numbness in pelvic area from rectal rection straining to empty bladder   . Stress incontinence   . Colon cancer 08-27-13    radiation /chemo -last ended 4 weeks(Dr. Benay Spice)    Past Surgical History  Procedure Laterality Date  . Back surgery    . Cesarean section      x2  . Cervical disc arthroplasty  01/03/2012    Procedure: CERVICAL ANTERIOR DISC ARTHROPLASTY;  Surgeon: Charlie Pitter, MD;  Location: [place] [facility];  Service: Neurosurgery;  Laterality: N/A;  Cervical Anterior Disc Arthorplasty Five-Six  . Eus N/A 05/31/2013    Procedure: LOWER ENDOSCOPIC ULTRASOUND (EUS);  Surgeon: Beryle Beams, MD;  Location: [facility];  Service: Endoscopy;  Laterality:  N/A;  . Intrauterine device insertion  2006    minera  . Knee arthroscopy Left 08-27-13    torn meniscus repair left   . Breast surgery      reduction surgery  . Refractive surgery    . Laparoscopic low anterior resection N/A 09/02/2013    Procedure: LAPAROSCOPIC LOW ANTERIOR RESECTION;  Surgeon: [name], MD;  [facility]: [facility];  Service: General;  Laterality: N/A;  . Laparotomy N/A 09/13/2013    Procedure: EXPLORATORY LAPAROTOMY / FLEXIBLE SIGMOIDOSCOPY/  DRAINAGE OF PELVIC ABSCESS WITH  DIVERTING  LOOP  ILEOSTOMY;  Surgeon: [name], MD;  [facility];  Service: General;  Laterality: N/A;  . Portacath placement Left 10/11/2013    Procedure: INSERTION PORT-A-CATH;  Surgeon: [name], MD;  [facility];  Service: General;  Laterality: Left;  . Appendectomy    . Bladder study       pressure readings had approx 1.5 months ago   . Ileo loop colostomy closure N/A 05/02/2014    Procedure: LAPAROSCOPIC ASSISTED REVERSAL ILEOSTOMY AND LYSIS OF ADHESIONS;  Surgeon: [name], MD;  [facility];  Service: General;  Laterality: N/A;  . Lysis of adhesion  05/02/2014    Procedure: LYSIS OF ADHESION;  Surgeon: [name], MD;  Location: [facility];  Service: General;;  . Port-a-cath removal Left 06/09/2014    Procedure: REMOVAL PORT-A-CATH  (MINOR PROCEDURE) ;  Surgeon: [name], MD;  Location: [facility];  Service: General;  Laterality: Left;    There were no vitals filed for this visit.  Visit Diagnosis:  PFD (pelvic floor dysfunction) - Plan: PT plan of care cert/re-cert      Subjective Assessment - 01/01/15 1026    Subjective Ilioeostomy reversal on 05/03/2015.  I then had a fissure and needed it to heal. Went to the bathroom 20 times per day and had increased pain when having a bowel movement. Patient reports pain has decreased since fissure has healed. Bladder  does not signal  when full and unable to feel bowel movement coming out. Patient  reports after ilioeostomy she had decreased sensation in pelvic area and trouble with bowel movement. Patient had a neurostimulator placed in 11/2014 and reduced bowel movements to 10 times per day. Patient reports she is having difficulty with pushing bowels out and frequent bowel movement.    Patient Stated Goals ability to push a bowel movement out with greater ease, having fewer bowel movement   Currently in Pain? Yes   Pain Score 3    Pain Location Rectum   Pain Orientation Mid   Pain Descriptors / Indicators Tightness   Pain Type Chronic pain   Pain Onset More than a month ago   Pain Frequency Constant   Aggravating Factors  sitting   Pain Relieving Factors standing   Multiple Pain Sites No            [facility] PT Assessment - 01/01/15 0001    Assessment   Medical Diagnosis Pelvic floor dysfunction; pudendal nerve injury   Onset Date/Surgical Date 05/03/15   Prior Therapy yes   Precautions   Precautions Other (comment)  no ultrasound, no electrical stimulation   Balance Screen   Has the patient fallen in the past 6 months No   Has the patient had a decrease in activity level because of a fear of falling?  No   Is the patient reluctant to leave their home because of a fear of falling?  No   Prior Function   Level of Independence Independent   Observation/Other Assessments   Observations decreased mobility of abdominal scars, tightness in transvers abdominal especially on left;    Focus on Therapeutic Outcomes (FOTO)  32% limitation   Posture/Postural Control   Posture/Postural Control Postural limitations   Postural Limitations Rounded Shoulders;Forward head;Flexed trunk   ROM / Strength   AROM / PROM / Strength AROM;PROM;Strength   AROM   AROM Assessment Site Lumbar   Lumbar Flexion full   Lumbar Extension decreased by 50%   Lumbar - Right Side Bend decreased by 25%   Lumbar - Left Side Bend decreased by 25%   Strength   Overall Strength Comments abdominal strength  2/5   Strength Assessment Site Hip   Right/Left Hip Right;Left   Right Hip Extension 4/5   Right Hip ABduction 3/5   Right Hip ADduction 4/5   Left Hip Extension 4/5   Left Hip ABduction 3/5   Left Hip ADduction 4/5   Palpation   SI assessment  right ilium is anteriorly rotated;                  Pelvic Floor Special Questions - 01/01/15 0001    Diastasis Recti 1 finger width below umbilicus   Urinary Leakage Yes  Activities that cause leaking Sneezing;Other;Coughing  when too full   Urinary urgency --  bowel urgency   Fecal incontinence Yes  when has loose stool, 2x/month   Pelvic Floor Internal Exam Patient confirms identification and approves therapist to assess muscle strength and muscle integrity   Exam Type Rectal;Vaginal   Sensation unable to feel pelvic floor contraction   Palpation decreased movement of puborectalis muscle   Strength weak squeeze, no lift  rectal 3/5   Strength # of seconds 2  bil.    Tone hypotone                  PT Education - 01/01/15 1122    Education provided Yes   Education Details pelvic floor contraction in sidely and hookly, bowel diary, timed voiding, catherize to see how much urine is in bladder   Person(s) Educated Patient   Methods Explanation;Demonstration;Tactile cues;Verbal cues;Handout   Comprehension Returned demonstration;Verbalized understanding          PT Short Term Goals - 01/01/15 1136    PT SHORT TERM GOAL #1   Title ability to contract abdominal muscles correctly to assist in bowel defecation   Time 4   Period Weeks   Status New   PT SHORT TERM GOAL #2   Title ability to produce a pelvic floor contraction anally with a lift   Time 4   Period Weeks   Status New   PT SHORT TERM GOAL #3   Title independent with scar tissue massage to do at home   Time 4   Period Weeks   Status New   PT SHORT TERM GOAL #4   Title posture awarness to reduce tension in pelvic floor   Time 4   Period  Weeks   Status New           PT Long Term Goals - 01/01/15 1137    PT LONG TERM GOAL #1   Title independent with HEP and understand how to progress herself   Time 12   Period Weeks   Status New   PT LONG TERM GOAL #2   Title ability to push out a bowel movement with >/= 50% greater ease due to improve pelvic floor strength 4/5   Time 12   Period Weeks   Status New   PT LONG TERM GOAL #3   Title abdominal strength 4/5 to assist in fully emptying her bladder and bowels   Time 12   Period Weeks   Status New   PT LONG TERM GOAL #4   Title ability to have a bowel movement without urinating due to improved coordination of the pelvic floor muscles   Time 12   Period Weeks   Status New   PT LONG TERM GOAL #5   Title ability to wait 2 hours to have a bowel movement or urinating   Time 12   Period Weeks   Status New               Plan - 01/01/15 1124    Clinical Impression Statement Patient is a 54 year old female with 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 Patient has had an ileostomy reversal on 05/02/2014.  Patient was having frequent bowel movements and difficulty wiht pushing a bowel movement out.  She had a neuromodulator stimulator placed in to reduce bowel movements.  Patient did have an anal fissure after surgery that caused pain wiht bowel movements.  the fissure is healed  now. Patient rpeorts she has nerve damage to her bladder.  Patient reports she is unable  to feel when she is urinating and bowel movment.  When patient has a bowel movement whe will urinate.  Patient does not feel she is fully emptying her bladder.  Patient  had anal cancer last year with treatment of chemotherapy and radiation.  Pain level in rectum is constant at llevel 3/10 wiht sitting increasing it.  FOTO score is 32% limitation.  Vaginal strength is 1/5 and rectal strength is 2/5 with no lift.  Abdominal strength is 2/5.  Bilateral hip abduction 3/5.  Bilateral hip  adduction and extension is 4/5.  Abdominal scars have decreased mobility.  Diastasis Recti 1 finger width below umbilicus.  Patient reports urinary leakage with sneezing and coughing.  Patient would benefit from physical therapy to improve pelvic floor strength, improve ability to push bowels out, improve urinary leakage, imporve ablility to have a bowel movement without urinating.    Pt will benefit from skilled therapeutic intervention in order to improve on the following deficits Decreased activity tolerance;Decreased mobility;Decreased strength;Impaired sensation;Impaired flexibility;Pain;Decreased endurance;Increased fascial restricitons;Decreased coordination   Rehab Potential Good   Clinical Impairments Affecting Rehab Potential None   PT Frequency 2x / week   PT Duration 12 weeks   PT Treatment/Interventions ADLs/Self Care Home Management;Biofeedback;Functional mobility training;Therapeutic activities;Therapeutic exercise;Neuromuscular re-education;Manual techniques;Patient/family education;Scar mobilization;Passive range of motion   PT Next Visit Plan core strengthening to assist in diastasis recti;  scar massage   PT Home Exercise Plan core strengthening   Recommended Other Services None   Consulted and Agree with Plan of Care Patient         Problem List Patient Active Problem List   Diagnosis Date Noted  . History of ileostomy 05/02/2014  . DVT (deep venous thrombosis) 01/14/2014  . Urinary tract infection 01/13/2014  . Large bowel perforation 09/13/2013  . Rectal cancer, 12 - 15 cm from anal verge 05/29/2013  . Herniation of cervical intervertebral disc with radiculopathy 01/03/2012    Margarette Vannatter,PT 01/01/2015, 11:42 AM  [facility] [address] [place], [place], [address] Phone: [phone]   Fax:  [phone]

## 2015-01-05 ENCOUNTER — Ambulatory Visit: Payer: 59 | Admitting: Physical Therapy

## 2015-01-05 ENCOUNTER — Encounter: Payer: Self-pay | Admitting: Physical Therapy

## 2015-01-05 DIAGNOSIS — M6289 Other specified disorders of muscle: Secondary | ICD-10-CM

## 2015-01-05 DIAGNOSIS — N8184 Pelvic muscle wasting: Secondary | ICD-10-CM | POA: Diagnosis not present

## 2015-01-05 NOTE — Patient Instructions (Signed)

## 2015-01-05 NOTE — Therapy (Signed)
[facility] [address] [place], [place], [address] Phone: [phone]   Fax:  [phone]  Physical Therapy Treatment  Patient Details  Name: [name] MRN: [id number] Date of Birth: 1961/03/06 Referring Provider:  Maisie Fus, MD  Encounter Date: 01/05/2015      PT End of Session - 01/05/15 1526    Visit Number 2   Date for PT Re-Evaluation 03/26/15   PT Start Time 8101   PT Stop Time 1527   PT Time Calculation (min) 42 min   Activity Tolerance Patient tolerated treatment well   Behavior During Therapy [facility] for tasks assessed/performed      Past Medical History  Diagnosis Date  . History of radiation therapy 06/18/13-07/26/13    rectal 50.4Gy total dose  . IUD (intrauterine device) in place 08-27-13    Mirena Implant inplace  . Arthritis 08-27-13    at present has ruptured disc- lower back-not an issue now  . PONV (postoperative nausea and vomiting)     scopalamine patch helped  . Hypertension     toxemia with pregnancy 1990  . Anxiety   . UTI (lower urinary tract infection)     hx of   . Blood infection     E Coli after UTI   . DVT (deep venous thrombosis)     left upper extremity currently has stopped xarelto   . Nerve damage     numbness in pelvic area from rectal rection straining to empty bladder   . Stress incontinence   . Colon cancer 08-27-13    radiation /chemo -last ended 4 weeks(Dr. Benay Spice)    Past Surgical History  Procedure Laterality Date  . Back surgery    . Cesarean section      x2  . Cervical disc arthroplasty  01/03/2012    Procedure: CERVICAL ANTERIOR DISC ARTHROPLASTY;  Surgeon: Charlie Pitter, MD;  Location: [facility];  Service: Neurosurgery;  Laterality: N/A;  Cervical Anterior Disc Arthorplasty Five-Six  . Eus N/A 05/31/2013    Procedure: LOWER ENDOSCOPIC ULTRASOUND (EUS);  Surgeon: Beryle Beams, MD;  Location: [facility];  Service: Endoscopy;  Laterality:  N/A;  . Intrauterine device insertion  2006    [facility]era  . Knee arthroscopy Left 08-27-13    torn meniscus repair left   . Breast surgery      reduction surgery  . Refractive surgery    . Laparoscopic low anterior resection N/A 09/02/2013    Procedure: LAPAROSCOPIC LOW ANTERIOR RESECTION;  Surgeon: [name], MD;  [facility];  Service: General;  Laterality: N/A;  . Laparotomy N/A 09/13/2013    Procedure: EXPLORATORY LAPAROTOMY / FLEXIBLE SIGMOIDOSCOPY/  DRAINAGE OF PELVIC ABSCESS WITH  DIVERTING  LOOP  ILEOSTOMY;  Surgeon: [name], MD;  [facility];  Service: General;  Laterality: N/A;  . Portacath placement Left 10/11/2013    Procedure: INSERTION PORT-A-CATH;  Surgeon: [name], MD;  [facility];  Service: General;  Laterality: Left;  . Appendectomy    . Bladder study       pressure readings had approx 1.5 months ago   . Ileo loop colostomy closure N/A 05/02/2014    Procedure: LAPAROSCOPIC ASSISTED REVERSAL ILEOSTOMY AND LYSIS OF ADHESIONS;  Surgeon: [name], MD;  [facility];  Service: General;  Laterality: N/A;  . Lysis of adhesion  05/02/2014    Procedure: LYSIS OF ADHESION;  Surgeon: [name], MD;  Location:  [facility];  Service: General;;  . Port-a-cath removal Left 06/09/2014    Procedure: REMOVAL PORT-A-CATH  (MINOR PROCEDURE) ;  Surgeon: [name], MD;  Location: [facility];  Service: General;  Laterality: Left;    There were no vitals filed for this visit.  Visit Diagnosis:  PFD (pelvic floor dysfunction)      Subjective Assessment - 01/05/15 1449    Subjective My bottome hurts today and going to the bathroom a lot. HAs been tracking her eating to make a connection but reports it is difficult to find any connections.   Currently in Pain? Yes   Pain Score 3    Pain Location Rectum   Pain Orientation Mid   Pain Descriptors / Indicators Burning   Aggravating Factors  Elimination and walking   Pain Relieving Factors  Sidelying   Multiple Pain Sites No                         [facility] Adult PT Treatment/Exercise - 01/05/15 0001    Manual Therapy   Manual Therapy Other (comment)   Manual therapy comments Scar tissue massage mid to lower abdominals                PT Education - 01/05/15 1453    Education provided Yes   Education Details HEP: TA exercises/core exercises   Person(s) Educated Patient   Methods Explanation;Demonstration;Tactile cues;Verbal cues;Handout   Comprehension Verbalized understanding;Returned demonstration          PT Short Term Goals - 01/05/15 1504    PT SHORT TERM GOAL #1   Title ability to contract abdominal muscles correctly to assist in bowel defecation   Time 4   Period Weeks   Status On-going   PT SHORT TERM GOAL #2   Title ability to produce a pelvic floor contraction anally with a lift   Time 4   Period Weeks   Status On-going   PT SHORT TERM GOAL #3   Title independent with scar tissue massage to do at home   Time 4   Period Weeks   Status On-going   PT SHORT TERM GOAL #4   Title posture awarness to reduce tension in pelvic floor   Time 4   Period Weeks   Status On-going           PT Long Term Goals - 01/01/15 1137    PT LONG TERM GOAL #1   Title independent with HEP and understand how to progress herself   Time 12   Period Weeks   Status New   PT LONG TERM GOAL #2   Title ability to push out a bowel movement with >/= 50% greater ease due to improve pelvic floor strength 4/5   Time 12   Period Weeks   Status New   PT LONG TERM GOAL #3   Title abdominal strength 4/5 to assist in fully emptying her bladder and bowels   Time 12   Period Weeks   Status New   PT LONG TERM GOAL #4   Title ability to have a bowel movement without urinating due to improved coordination of the pelvic floor muscles   Time 12   Period Weeks   Status New   PT LONG TERM GOAL #5   Title ability to wait 2 hours to have a bowel movement or  urinating   Time 12   Period Weeks   Status New  Plan - 01/05/15 1527    Clinical Impression Statement Only second visit so not much has changed since the eval. Pt is compliant with her initial HEP which we added lower abdominal exercises to it today. Continues to have scar tissue throughout the abdomina. This did soften slightly especially on the right side with scar tissue work today.    Pt will benefit from skilled therapeutic intervention in order to improve on the following deficits Decreased activity tolerance;Decreased mobility;Decreased strength;Impaired sensation;Impaired flexibility;Pain;Decreased endurance;Increased fascial restricitons;Decreased coordination   Rehab Potential Good   Clinical Impairments Affecting Rehab Potential None   PT Frequency 2x / week   PT Duration 12 weeks   PT Treatment/Interventions ADLs/Self Care Home Management;Biofeedback;Functional mobility training;Therapeutic activities;Therapeutic exercise;Neuromuscular re-education;Manual techniques;Patient/family education;Scar mobilization;Passive range of motion   PT Next Visit Plan [place] PT plan, see how she tolerated the scar tissue massage.   Consulted and Agree with Plan of Care Patient        Problem List Patient Active Problem List   Diagnosis Date Noted  . History of ileostomy 05/02/2014  . DVT (deep venous thrombosis) 01/14/2014  . Urinary tract infection 01/13/2014  . Large bowel perforation 09/13/2013  . Rectal cancer, 12 - 15 cm from anal verge 05/29/2013  . Herniation of cervical intervertebral disc with radiculopathy 01/03/2012    Kallan Merrick, PTA 01/05/2015, 3:31 PM  [facility] [address] [place], [place], [address] Phone: [phone]   Fax:  [phone]

## 2015-01-07 ENCOUNTER — Ambulatory Visit: Payer: 59 | Admitting: Physical Therapy

## 2015-01-07 ENCOUNTER — Encounter: Payer: Self-pay | Admitting: Physical Therapy

## 2015-01-07 DIAGNOSIS — M6289 Other specified disorders of muscle: Secondary | ICD-10-CM

## 2015-01-07 DIAGNOSIS — N8184 Pelvic muscle wasting: Secondary | ICD-10-CM | POA: Diagnosis not present

## 2015-01-07 NOTE — Therapy (Signed)
[facility] [address], [place], [place], [address] Phone: [phone]   Fax:  [phone]  Physical Therapy Treatment  Patient Details  Name: [name] MRN: [id number] Date of Birth: 1961-02-11 Referring Provider:  Juanda Chance, NP  Encounter Date: 01/07/2015      PT End of Session - 01/07/15 1422    Visit Number 3   Date for PT Re-Evaluation 03/26/15   PT Start Time 1404   PT Stop Time 1445   PT Time Calculation (min) 41 min   Activity Tolerance Patient tolerated treatment well   Behavior During Therapy [facility] for tasks assessed/performed      Past Medical History  Diagnosis Date  . History of radiation therapy 06/18/13-07/26/13    rectal 50.4Gy total dose  . IUD (intrauterine device) in place 08-27-13    Mirena Implant inplace  . Arthritis 08-27-13    at present has ruptured disc- lower back-not an issue now  . PONV (postoperative nausea and vomiting)     scopalamine patch helped  . Hypertension     toxemia with pregnancy 1990  . Anxiety   . UTI (lower urinary tract infection)     hx of   . Blood infection     E Coli after UTI   . DVT (deep venous thrombosis)     left upper extremity currently has stopped xarelto   . Nerve damage     numbness in pelvic area from rectal rection straining to empty bladder   . Stress incontinence   . Colon cancer 08-27-13    radiation /chemo -last ended 4 weeks(Dr. Benay Spice)    Past Surgical History  Procedure Laterality Date  . Back surgery    . Cesarean section      x2  . Cervical disc arthroplasty  01/03/2012    Procedure: CERVICAL ANTERIOR DISC ARTHROPLASTY;  Surgeon: Charlie Pitter, MD;  Location: [place] [facility];  Service: Neurosurgery;  Laterality: N/A;  Cervical Anterior Disc Arthorplasty Five-Six  . Eus N/A 05/31/2013    Procedure: LOWER ENDOSCOPIC ULTRASOUND (EUS);  Surgeon: Beryle Beams, MD;  Location: [facility];  Service: Endoscopy;  Laterality: N/A;   . Intrauterine device insertion  2006    minera  . Knee arthroscopy Left 08-27-13    torn meniscus repair left   . Breast surgery      reduction surgery  . Refractive surgery    . Laparoscopic low anterior resection N/A 09/02/2013    Procedure: LAPAROSCOPIC LOW ANTERIOR RESECTION;  Surgeon: [name], MD;  Location: [facility];  Service: General;  Laterality: N/A;  . Laparotomy N/A 09/13/2013    Procedure: EXPLORATORY LAPAROTOMY / FLEXIBLE SIGMOIDOSCOPY/  DRAINAGE OF PELVIC ABSCESS WITH  DIVERTING  LOOP  ILEOSTOMY;  Surgeon: [name], MD;  Location: [facility];  Service: General;  Laterality: N/A;  . Portacath placement Left 10/11/2013    Procedure: INSERTION PORT-A-CATH;  Surgeon: [name], MD;  Location: [facility];  Service: General;  Laterality: Left;  . Appendectomy    . Bladder study       pressure readings had approx 1.5 months ago   . Ileo loop colostomy closure N/A 05/02/2014    Procedure: LAPAROSCOPIC ASSISTED REVERSAL ILEOSTOMY AND LYSIS OF ADHESIONS;  Surgeon: [name], MD;  Location: [facility];  Service: General;  Laterality: N/A;  . Lysis of adhesion  05/02/2014    Procedure: LYSIS OF ADHESION;  Surgeon: [name], MD;  Location: [facility];  Service: General;;  . Port-a-cath removal Left 06/09/2014    Procedure: REMOVAL PORT-A-CATH  (MINOR PROCEDURE) ;  Surgeon: [name], MD;  Location: [place];  Service: General;  Laterality: Left;    There were no vitals filed for this visit.  Visit Diagnosis:  PFD (pelvic floor dysfunction)      Subjective Assessment - 01/07/15 1406    Subjective Felt ok after last tx, doing exercises, walked for 2 hrs this AM. PA told pt she could stay out of work 12 weeks to concentrate on therapy.   Currently in Pain? Yes   Pain Score 3    Pain Location Rectum   Pain Orientation Mid   Pain Descriptors / Indicators Dull   Multiple Pain Sites No                         [facility] Adult PT  Treatment/Exercise - 01/07/15 0001    Lumbar Exercises: Supine   Ab Set 10 reps;3 seconds   Heel Slides 5 reps   Bent Knee Raise 10 reps   Bent Knee Raise Limitations Vc to relx UE, neck   Bridge 10 reps;5 seconds   Manual Therapy   Manual therapy comments Scar tissue massage mid to lower abdominals                PT Education - 01/07/15 1423    Education provided Yes   Education Details HEP bridge with ball squeeze   Person(s) Educated Patient   Methods Explanation;Demonstration;Tactile cues;Verbal cues;Handout   Comprehension Verbalized understanding;Returned demonstration          PT Short Term Goals - 01/05/15 1504    PT SHORT TERM GOAL #1   Title ability to contract abdominal muscles correctly to assist in bowel defecation   Time 4   Period Weeks   Status On-going   PT SHORT TERM GOAL #2   Title ability to produce a pelvic floor contraction anally with a lift   Time 4   Period Weeks   Status On-going   PT SHORT TERM GOAL #3   Title independent with scar tissue massage to do at home   Time 4   Period Weeks   Status On-going   PT SHORT TERM GOAL #4   Title posture awarness to reduce tension in pelvic floor   Time 4   Period Weeks   Status On-going           PT Long Term Goals - 01/01/15 1137    PT LONG TERM GOAL #1   Title independent with HEP and understand how to progress herself   Time 12   Period Weeks   Status New   PT LONG TERM GOAL #2   Title ability to push out a bowel movement with >/= 50% greater ease due to improve pelvic floor strength 4/5   Time 12   Period Weeks   Status New   PT LONG TERM GOAL #3   Title abdominal strength 4/5 to assist in fully emptying her bladder and bowels   Time 12   Period Weeks   Status New   PT LONG TERM GOAL #4   Title ability to have a bowel movement without urinating due to improved coordination of the pelvic floor muscles   Time 12   Period Weeks   Status New   PT LONG TERM GOAL #5   Title  ability to wait 2 hours to  have a bowel movement or urinating   Time 12   Period Weeks   Status New               Plan - 01/07/15 1447    Clinical Impression Statement Abdominal tissue more supple with addition of scar tissue massage today. Pt compliant with her HEP and added the bridge exercise today.    Pt will benefit from skilled therapeutic intervention in order to improve on the following deficits Decreased activity tolerance;Decreased mobility;Decreased strength;Impaired sensation;Impaired flexibility;Pain;Decreased endurance;Increased fascial restricitons;Decreased coordination   Rehab Potential Good   Clinical Impairments Affecting Rehab Potential None   PT Frequency 2x / week   PT Duration 12 weeks   PT Treatment/Interventions ADLs/Self Care Home Management;Biofeedback;Functional mobility training;Therapeutic activities;Therapeutic exercise;Neuromuscular re-education;Manual techniques;Patient/family education;Scar mobilization;Passive range of motion   PT Next Visit Plan Pelvic floor strength   Consulted and Agree with Plan of Care Patient        Problem List Patient Active Problem List   Diagnosis Date Noted  . History of ileostomy 05/02/2014  . DVT (deep venous thrombosis) 01/14/2014  . Urinary tract infection 01/13/2014  . Large bowel perforation 09/13/2013  . Rectal cancer, 12 - 15 cm from anal verge 05/29/2013  . Herniation of cervical intervertebral disc with radiculopathy 01/03/2012    Dilia Alemany, PTA 01/07/2015, 2:48 PM  [facility] [address] [place], [place], [address] Phone: [phone]   Fax:  [phone]

## 2015-01-07 NOTE — Patient Instructions (Addendum)
Bridge   Lying on back, legs bent 90, feet flat on floor. Put ball or folded pillow between knees. Squeeze pillow first then press up hips and torso, reaching hands to feet. AT the top perform a pelvic floor lift, engage abdominals and hold for 3 sec. Do 10x Hold for ____ breaths. ADVANCED: Clasp hands underneath back and squeeze shoulder blades together, lifting upper body onto outside of shoulders.  Copyright  VHI. All rights reserved.

## 2015-01-12 ENCOUNTER — Encounter: Payer: Self-pay | Admitting: Physical Therapy

## 2015-01-12 ENCOUNTER — Ambulatory Visit: Payer: 59 | Admitting: Physical Therapy

## 2015-01-12 DIAGNOSIS — N8184 Pelvic muscle wasting: Secondary | ICD-10-CM | POA: Diagnosis not present

## 2015-01-12 DIAGNOSIS — M6289 Other specified disorders of muscle: Secondary | ICD-10-CM

## 2015-01-12 NOTE — Therapy (Signed)
[facility] [address] [place], [place], [address] Phone: [phone]   Fax:  [phone]  Physical Therapy Treatment  Patient Details  Name: [name] MRN: [id number] Date of Birth: January 25, 1961 Referring Provider:  Juanda Chance, NP  Encounter Date: 01/12/2015      PT End of Session - 01/12/15 1535    Visit Number 4   Date for PT Re-Evaluation 03/26/15   PT Start Time 1761   PT Stop Time 1615   PT Time Calculation (min) 45 min   Activity Tolerance Patient tolerated treatment well   Behavior During Therapy [facility] for tasks assessed/performed      Past Medical History  Diagnosis Date  . History of radiation therapy 06/18/13-07/26/13    rectal 50.4Gy total dose  . IUD (intrauterine device) in place 08-27-13    Mirena Implant inplace  . Arthritis 08-27-13    at present has ruptured disc- lower back-not an issue now  . PONV (postoperative nausea and vomiting)     scopalamine patch helped  . Hypertension     toxemia with pregnancy 1990  . Anxiety   . UTI (lower urinary tract infection)     hx of   . Blood infection     E Coli after UTI   . DVT (deep venous thrombosis)     left upper extremity currently has stopped xarelto   . Nerve damage     numbness in pelvic area from rectal rection straining to empty bladder   . Stress incontinence   . Colon cancer 08-27-13    radiation /chemo -last ended 4 weeks(Dr. Benay Spice)    Past Surgical History  Procedure Laterality Date  . Back surgery    . Cesarean section      x2  . Cervical disc arthroplasty  01/03/2012    Procedure: CERVICAL ANTERIOR DISC ARTHROPLASTY;  Surgeon: Charlie Pitter, MD;  Location: [place] [facility];  Service: Neurosurgery;  Laterality: N/A;  Cervical Anterior Disc Arthorplasty Five-Six  . Eus N/A 05/31/2013    Procedure: LOWER ENDOSCOPIC ULTRASOUND (EUS);  Surgeon: Beryle Beams, MD;  Location: [facility];  Service: Endoscopy;  Laterality: N/A;   . Intrauterine device insertion  2006    minera  . Knee arthroscopy Left 08-27-13    torn meniscus repair left   . Breast surgery      reduction surgery  . Refractive surgery    . Laparoscopic low anterior resection N/A 09/02/2013    Procedure: LAPAROSCOPIC LOW ANTERIOR RESECTION;  Surgeon: [name], MD;  [facility]: [facility];  Service: General;  Laterality: N/A;  . Laparotomy N/A 09/13/2013    Procedure: EXPLORATORY LAPAROTOMY / FLEXIBLE SIGMOIDOSCOPY/  DRAINAGE OF PELVIC ABSCESS WITH  DIVERTING  LOOP  ILEOSTOMY;  Surgeon: [name], MD;  [facility];  Service: General;  Laterality: N/A;  . Portacath placement Left 10/11/2013    Procedure: INSERTION PORT-A-CATH;  Surgeon: [name], MD;  [facility]: [facility];  Service: General;  Laterality: Left;  . Appendectomy    . Bladder study       pressure readings had approx 1.5 months ago   . Ileo loop colostomy closure N/A 05/02/2014    Procedure: LAPAROSCOPIC ASSISTED REVERSAL ILEOSTOMY AND LYSIS OF ADHESIONS;  Surgeon: [name], MD;  [facility]: [facility];  Service: General;  Laterality: N/A;  . Lysis of adhesion  05/02/2014    Procedure: LYSIS OF ADHESION;  Surgeon: [name], MD;  Location: [facility];  Service: General;;  . Port-a-cath removal Left 06/09/2014    Procedure: REMOVAL PORT-A-CATH  (MINOR PROCEDURE) ;  Surgeon: [name], MD;  Location: [facility];  Service: General;  Laterality: Left;    There were no vitals filed for this visit.  Visit Diagnosis:  PFD (pelvic floor dysfunction)      Subjective Assessment - 01/12/15 1536    Subjective I am having easier time going to the bathroom by 10%.  When I push I am getting some more movement. I had pain and needed to have a bowel movement with longer bowel movement  and hod to go 4 times. After catherization only teaspon of urine.    Patient Stated Goals ability to push a bowel movement out with greater ease, having fewer bowel movement   Currently in  Pain? Yes   Pain Score 7    Pain Location Abdomen  lower   Pain Orientation Lower   Pain Descriptors / Indicators Sharp   Pain Type Acute pain   Pain Onset In the past 7 days   Pain Frequency Intermittent   Aggravating Factors  just before bowel movement   Pain Relieving Factors bowel movement   Multiple Pain Sites No                      Pelvic Floor Special Questions - 01/12/15 0001    Diastasis Recti 1/2 finger width   Urinary Leakage Yes   Activities that cause leaking Sneezing  squat           [facility] Adult PT Treatment/Exercise - 01/12/15 0001    Posture/Postural Control   Posture Comments instructed patient on correct posture to perform pelvic floor contraction correctly   Self-Care   Self-Care Scar Mobilizations   Scar Mobilizations to abdominal scar   Lumbar Exercises: Supine   Ab Set 10 reps;3 seconds   Heel Slides 5 reps   Bent Knee Raise 10 reps   Bridge 10 reps;5 seconds   Manual Therapy   Manual Therapy Soft tissue mobilization;Myofascial release;Other (comment)   Manual therapy comments Scar tissue massage mid to lower abdominals   Myofascial Release to diaphragm, transverse abdominal, lower abdominal.    Other Manual Therapy instructed patient on how to perform self scar massage to abdomen to perfrom daily                PT Education - 01/12/15 1617    Education provided Yes   Education Details reviewed past HEP, scar massage, posture, avoid urge to void   Person(s) Educated Patient   Methods Explanation;Demonstration;Tactile cues;Verbal cues;Handout   Comprehension Returned demonstration;Verbalized understanding          PT Short Term Goals - 01/12/15 1608    PT SHORT TERM GOAL #1   Title ability to contract abdominal muscles correctly to assist in bowel defecation   Time 4   Period Weeks   Status Achieved   PT SHORT TERM GOAL #2   Title ability to produce a pelvic floor contraction anally with a lift   Time 4    Period Weeks   Status On-going   PT SHORT TERM GOAL #3   Title independent with scar tissue massage to do at home   Time 4   Period Weeks   Status Achieved   PT SHORT TERM GOAL #4   Title posture awarness to reduce tension in pelvic floor   Time 4   Period Weeks  Status Achieved           PT Long Term Goals - 01/01/15 1137    PT LONG TERM GOAL #1   Title independent with HEP and understand how to progress herself   Time 12   Period Weeks   Status New   PT LONG TERM GOAL #2   Title ability to push out a bowel movement with >/= 50% greater ease due to improve pelvic floor strength 4/5   Time 12   Period Weeks   Status New   PT LONG TERM GOAL #3   Title abdominal strength 4/5 to assist in fully emptying her bladder and bowels   Time 12   Period Weeks   Status New   PT LONG TERM GOAL #4   Title ability to have a bowel movement without urinating due to improved coordination of the pelvic floor muscles   Time 12   Period Weeks   Status New   PT LONG TERM GOAL #5   Title ability to wait 2 hours to have a bowel movement or urinating   Time 12   Period Weeks   Status New               Plan - 01/12/15 1607    Clinical Impression Statement Patient has met STG #41m3 and 4.  Patient will work on pelvic EMG to work on lift.  Patient has improved mobility of abdominal scar by 25%.  Patient is able to contract her abdominals correctly.  Patient diastasis recti decreased by 1/2 finger width.  Patient is able to push a bowel movement out with greater ease.  Patient is having a bowel movement with urination every hour.  Patient is going  to work on holding a bowel movement every 1 hour 15 min. Patient benefit from physical therapy to inprove core srength, imporve tissue mobility and work on having a bowel movement without urinating.    Pt will benefit from skilled therapeutic intervention in order to improve on the following deficits Decreased activity tolerance;Decreased  mobility;Decreased strength;Impaired sensation;Impaired flexibility;Pain;Decreased endurance;Increased fascial restricitons;Decreased coordination   Rehab Potential Good   Clinical Impairments Affecting Rehab Potential None   PT Frequency 2x / week   PT Duration 12 weeks   PT Treatment/Interventions ADLs/Self Care Home Management;Biofeedback;Functional mobility training;Therapeutic activities;Therapeutic exercise;Neuromuscular re-education;Manual techniques;Patient/family education;Scar mobilization;Passive range of motion   PT Next Visit Plan abdominal strength with PTA and soft tissue work, Pelvic floor EMG with PT and soft tissue work.    PT Home Exercise Plan progress as needed   Consulted and Agree with Plan of Care Patient        Problem List Patient Active Problem List   Diagnosis Date Noted  . History of ileostomy 05/02/2014  . DVT (deep venous thrombosis) 01/14/2014  . Urinary tract infection 01/13/2014  . Large bowel perforation 09/13/2013  . Rectal cancer, 12 - 15 cm from anal verge 05/29/2013  . Herniation of cervical intervertebral disc with radiculopathy 01/03/2012    Danilo Cappiello,PT 01/12/2015, 4:25 PM  [facility] [address] [place] [id number]: [phone]  Fax:  [phone]

## 2015-01-12 NOTE — Patient Instructions (Addendum)
Relaxation Exercises with the Urge to Void   When you experience an urge to void:  FIRST  Stop and stand very still    Sit down if you can    Don't move    You need to stay very still to maintain control  SECOND Squeeze your pelvic floor muscles 5 times, like a quick flick, to keep from leaking  THIRD Relax  Take a deep breath and then let it out  Try to make the urge go away by using relaxation and visualization techniques  FINALLY When you feel the urge go away somewhat, walk normally to the bathroom.   If the urge gets suddenly stronger on the way, you may stop again and relax to regain control.  try to resist going to the bathroom every 1 15 min.   Daily perform abdominal massage to scar for 5-10 min.  Posture - Sitting   Sit upright, head facing forward. Try using a roll to support lower back. Keep shoulders relaxed, and avoid rounded back. Keep hips level with knees. Avoid crossing legs for long periods.   Copyright  VHI. All rights reserved.  [place] [address], [place], [place] [address] Phone # [phone] Fax [phone]

## 2015-01-16 ENCOUNTER — Ambulatory Visit: Payer: 59 | Admitting: Physical Therapy

## 2015-01-16 ENCOUNTER — Encounter: Payer: Self-pay | Admitting: Physical Therapy

## 2015-01-16 DIAGNOSIS — N8184 Pelvic muscle wasting: Secondary | ICD-10-CM | POA: Diagnosis not present

## 2015-01-16 DIAGNOSIS — M6289 Other specified disorders of muscle: Secondary | ICD-10-CM

## 2015-01-16 NOTE — Patient Instructions (Signed)
Combination: Slow Hold With Quick Flick (Hook-Lying)   Lie with hips and knees bent. Tighten pelvic floor and Hold for 5___ seconds 2 times. Then  do _3__ quick flicks. Relax 5 seconds. Repeat _5__ times. Do __3_ times a day.  Can use with sensor to improve feeling.  Copyright  VHI. All rights reserved.  [place] [address], [place], [place] [address] Phone # [phone] Fax [phone]

## 2015-01-16 NOTE — Therapy (Signed)
[facility] [address] [place], [place], [address] Phone: [phone]   Fax:  [phone]  Physical Therapy Treatment  Patient Details  Name: [name] MRN: [id number] Date of Birth: 1960-11-05 Referring Provider:  Juanda Chance, NP  Encounter Date: 01/16/2015      PT End of Session - 01/16/15 0804    Visit Number 4   Date for PT Re-Evaluation 03/26/15   PT Start Time 0800   PT Stop Time 0840   PT Time Calculation (min) 40 min   Activity Tolerance Patient tolerated treatment well   Behavior During Therapy [facility] for tasks assessed/performed      Past Medical History  Diagnosis Date  . History of radiation therapy 06/18/13-07/26/13    rectal 50.4Gy total dose  . IUD (intrauterine device) in place 08-27-13    Mirena Implant inplace  . Arthritis 08-27-13    at present has ruptured disc- lower back-not an issue now  . PONV (postoperative nausea and vomiting)     scopalamine patch helped  . Hypertension     toxemia with pregnancy 1990  . Anxiety   . UTI (lower urinary tract infection)     hx of   . Blood infection     E Coli after UTI   . DVT (deep venous thrombosis)     left upper extremity currently has stopped xarelto   . Nerve damage     numbness in pelvic area from rectal rection straining to empty bladder   . Stress incontinence   . Colon cancer 08-27-13    radiation /chemo -last ended 4 weeks(Dr. Benay Spice)    Past Surgical History  Procedure Laterality Date  . Back surgery    . Cesarean section      x2  . Cervical disc arthroplasty  01/03/2012    Procedure: CERVICAL ANTERIOR DISC ARTHROPLASTY;  Surgeon: Charlie Pitter, MD;  Location: [facility];  Service: Neurosurgery;  Laterality: N/A;  Cervical Anterior Disc Arthorplasty Five-Six  . Eus N/A 05/31/2013    Procedure: LOWER ENDOSCOPIC ULTRASOUND (EUS);  Surgeon: Beryle Beams, MD;  Location: [facility];  Service: Endoscopy;  Laterality: N/A;   . Intrauterine device insertion  2006    minera  . Knee arthroscopy Left 08-27-13    torn meniscus repair left   . Breast surgery      reduction surgery  . Refractive surgery    . Laparoscopic low anterior resection N/A 09/02/2013    Procedure: LAPAROSCOPIC LOW ANTERIOR RESECTION;  Surgeon: [name], MD;  [facility]: [facility];  Service: General;  Laterality: N/A;  . Laparotomy N/A 09/13/2013    Procedure: EXPLORATORY LAPAROTOMY / FLEXIBLE SIGMOIDOSCOPY/  DRAINAGE OF PELVIC ABSCESS WITH  DIVERTING  LOOP  ILEOSTOMY;  Surgeon: [name], MD;  [facility];  Service: General;  Laterality: N/A;  . Portacath placement Left 10/11/2013    Procedure: INSERTION PORT-A-CATH;  Surgeon: [name], MD;  [facility];  Service: General;  Laterality: Left;  . Appendectomy    . Bladder study       pressure readings had approx 1.5 months ago   . Ileo loop colostomy closure N/A 05/02/2014    Procedure: LAPAROSCOPIC ASSISTED REVERSAL ILEOSTOMY AND LYSIS OF ADHESIONS;  Surgeon: [name], MD;  [facility];  Service: General;  Laterality: N/A;  . Lysis of adhesion  05/02/2014    Procedure: LYSIS OF ADHESION;  Surgeon: [name], MD;  Location: [facility];  Service: General;;  . Port-a-cath removal Left 06/09/2014    Procedure: REMOVAL PORT-A-CATH  (MINOR PROCEDURE) ;  Surgeon: [name], MD;  Location: [facility];  Service: General;  Laterality: Left;    There were no vitals filed for this visit.  Visit Diagnosis:  PFD (pelvic floor dysfunction)      Subjective Assessment - 01/16/15 0805    Subjective I had the sensation to have a bowel movement and had to go 3 times in a row.  Bowels are solid.   Patient Stated Goals ability to push a bowel movement out with greater ease, having fewer bowel movement   Currently in Pain? Yes   Pain Score 2    Pain Location Rectum   Pain Orientation Mid   Pain Descriptors / Indicators Sore   Pain Type Chronic pain   Pain  Onset 1 to 4 weeks ago   Pain Frequency Intermittent   Aggravating Factors  bowel movement   Pain Relieving Factors bowel movement   Multiple Pain Sites No                      Pelvic Floor Special Questions - 01/16/15 0001    Biofeedback sensor type Rectal   Biofeedback Activity Quick contraction;Other    Tried the rectal internal sensor but patient felt stinging and would not connect properly.  Pelvic floor EMG with surface electrodes on the anal region:  Resting tone prior to exercise: 1.19 uv 3 quick contraction: max 7.99 uv average  3.33 uv trouble relaxing pelvic floor after contraction and trouble producing a quick flick 10 second contraction: max 7.78 uv average 4.36 uv. Takes 2 seconds to relax 20 second contraction: 4.44uv Resting tone after exercise: 0.81 uv  2 quick contraction followed by 2  5 second hold while squeeze ball between thighs. Max is 8 uv with threshold 5 uv.                PT Education - 01/16/15 1148    Education provided Yes   Education Details pelvic floor contraction in hookly with abdominal contraction    Person(s) Educated Patient   Methods Explanation;Demonstration;Tactile cues;Verbal cues;Handout   Comprehension Returned demonstration;Verbalized understanding          PT Short Term Goals - 01/12/15 1608    PT SHORT TERM GOAL #1   Title ability to contract abdominal muscles correctly to assist in bowel defecation   Time 4   Period Weeks   Status Achieved   PT SHORT TERM GOAL #2   Title ability to produce a pelvic floor contraction anally with a lift   Time 4   Period Weeks   Status On-going   PT SHORT TERM GOAL #3   Title independent with scar tissue massage to do at home   Time 4   Period Weeks   Status Achieved   PT SHORT TERM GOAL #4   Title posture awarness to reduce tension in pelvic floor   Time 4   Period Weeks   Status Achieved           PT Long Term Goals - 01/01/15 1137    PT LONG TERM  GOAL #1   Title independent with HEP and understand how to progress herself   Time 12   Period Weeks   Status New   PT LONG TERM GOAL #2   Title ability to push out a bowel movement with >/= 50% greater ease  due to improve pelvic floor strength 4/5   Time 12   Period Weeks   Status New   PT LONG TERM GOAL #3   Title abdominal strength 4/5 to assist in fully emptying her bladder and bowels   Time 12   Period Weeks   Status New   PT LONG TERM GOAL #4   Title ability to have a bowel movement without urinating due to improved coordination of the pelvic floor muscles   Time 12   Period Weeks   Status New   PT LONG TERM GOAL #5   Title ability to wait 2 hours to have a bowel movement or urinating   Time 12   Period Weeks   Status New               Plan - 01/16/15 1149    Clinical Impression Statement Patient has not met goals this week but is working on her strength and control to improve.  Pelvic floor EMG showed difficutly with quickly contracting and relaxing the pelvic floor. Patient has good resting tone.  Patient can contract pelvic floor average 4.36 uv but  will fatigue after 2 contractions. Patient continues to require physical therapy to improve pelvic floor strength and coordination.     Pt will benefit from skilled therapeutic intervention in order to improve on the following deficits Decreased activity tolerance;Decreased mobility;Decreased strength;Impaired sensation;Impaired flexibility;Pain;Decreased endurance;Increased fascial restricitons;Decreased coordination   Rehab Potential Good   Clinical Impairments Affecting Rehab Potential None   PT Frequency 2x / week   PT Duration 12 weeks   PT Treatment/Interventions ADLs/Self Care Home Management;Biofeedback;Functional mobility training;Therapeutic activities;Therapeutic exercise;Neuromuscular re-education;Manual techniques;Patient/family education;Scar mobilization;Passive range of motion   PT Next Visit Plan  abdominal strength with PTA and soft tissue work, Pelvic floor EMG with PT and soft tissue work.    PT Home Exercise Plan progress as needed   Consulted and Agree with Plan of Care Patient        Problem List Patient Active Problem List   Diagnosis Date Noted  . History of ileostomy 05/02/2014  . DVT (deep venous thrombosis) 01/14/2014  . Urinary tract infection 01/13/2014  . Large bowel perforation 09/13/2013  . Rectal cancer, 12 - 15 cm from anal verge 05/29/2013  . Herniation of cervical intervertebral disc with radiculopathy 01/03/2012    Maleak Brazzel,PT 01/16/2015, 11:53 AM  [facility] [address] [place], [place], [address] Phone: [phone]   Fax:  [phone]

## 2015-01-19 ENCOUNTER — Encounter: Payer: 59 | Admitting: Physical Therapy

## 2015-01-21 ENCOUNTER — Encounter: Payer: 59 | Admitting: Physical Therapy

## 2015-01-26 ENCOUNTER — Encounter: Payer: Self-pay | Admitting: Physical Therapy

## 2015-01-26 ENCOUNTER — Ambulatory Visit: Payer: 59 | Attending: Obstetrics and Gynecology | Admitting: Physical Therapy

## 2015-01-26 DIAGNOSIS — N8184 Pelvic muscle wasting: Secondary | ICD-10-CM | POA: Insufficient documentation

## 2015-01-26 DIAGNOSIS — M6289 Other specified disorders of muscle: Secondary | ICD-10-CM

## 2015-01-26 NOTE — Therapy (Signed)
[facility] [address], [place], [place], [address] Phone: [phone]   Fax:  [phone]  Physical Therapy Treatment  Patient Details  Name: [name] MRN: [id number] Date of Birth: 1961/06/08 Referring Provider:  Juanda Chance, NP  Encounter Date: 01/26/2015      PT End of Session - 01/26/15 1452    Visit Number 5   Date for PT Re-Evaluation 03/26/15   PT Start Time 1445   PT Stop Time 1525   PT Time Calculation (min) 40 min   Activity Tolerance Patient tolerated treatment well   Behavior During Therapy [facility] for tasks assessed/performed      Past Medical History  Diagnosis Date  . History of radiation therapy 06/18/13-07/26/13    rectal 50.4Gy total dose  . IUD (intrauterine device) in place 08-27-13    Mirena Implant inplace  . Arthritis 08-27-13    at present has ruptured disc- lower back-not an issue now  . PONV (postoperative nausea and vomiting)     scopalamine patch helped  . Hypertension     toxemia with pregnancy 1990  . Anxiety   . UTI (lower urinary tract infection)     hx of   . Blood infection     E Coli after UTI   . DVT (deep venous thrombosis)     left upper extremity currently has stopped xarelto   . Nerve damage     numbness in pelvic area from rectal rection straining to empty bladder   . Stress incontinence   . Colon cancer 08-27-13    radiation /chemo -last ended 4 weeks(Dr. Benay Spice)    Past Surgical History  Procedure Laterality Date  . Back surgery    . Cesarean section      x2  . Cervical disc arthroplasty  01/03/2012    Procedure: CERVICAL ANTERIOR DISC ARTHROPLASTY;  Surgeon: Charlie Pitter, MD;  Location: [place] [facility];  Service: Neurosurgery;  Laterality: N/A;  Cervical Anterior Disc Arthorplasty Five-Six  . Eus N/A 05/31/2013    Procedure: LOWER ENDOSCOPIC ULTRASOUND (EUS);  Surgeon: Beryle Beams, MD;  Location: [facility];  Service: Endoscopy;  Laterality: N/A;   . Intrauterine device insertion  2006    minera  . Knee arthroscopy Left 08-27-13    torn meniscus repair left   . Breast surgery      reduction surgery  . Refractive surgery    . Laparoscopic low anterior resection N/A 09/02/2013    Procedure: LAPAROSCOPIC LOW ANTERIOR RESECTION;  Surgeon: [name], MD;  [facility];  Service: General;  Laterality: N/A;  . Laparotomy N/A 09/13/2013    Procedure: EXPLORATORY LAPAROTOMY / FLEXIBLE SIGMOIDOSCOPY/  DRAINAGE OF PELVIC ABSCESS WITH  DIVERTING  LOOP  ILEOSTOMY;  Surgeon: [name], MD;  [facility];  Service: General;  Laterality: N/A;  . Portacath placement Left 10/11/2013    Procedure: INSERTION PORT-A-CATH;  Surgeon: [name], MD;  [facility];  Service: General;  Laterality: Left;  . Appendectomy    . Bladder study       pressure readings had approx 1.5 months ago   . Ileo loop colostomy closure N/A 05/02/2014    Procedure: LAPAROSCOPIC ASSISTED REVERSAL ILEOSTOMY AND LYSIS OF ADHESIONS;  Surgeon: [name], MD;  [facility];  Service: General;  Laterality: N/A;  . Lysis of adhesion  05/02/2014    Procedure: LYSIS OF ADHESION;  Surgeon: [name], MD;  Location: [facility];  Service: General;;  . Port-a-cath removal Left 06/09/2014    Procedure: REMOVAL PORT-A-CATH  (MINOR PROCEDURE) ;  Surgeon: [name], MD;  Location: [facility];  Service: General;  Laterality: Left;    There were no vitals filed for this visit.  Visit Diagnosis:  PFD (pelvic floor dysfunction)                    Pelvic Floor Special Questions - 01/26/15 0001    Pelvic Floor Internal Exam Patient confirms identification and approves therapist to assess muscle strength and muscle integrity   Exam Type Rectal;Vaginal   Strength fair squeeze, definite lift  verbal cues to lift           [facility] Adult PT Treatment/Exercise - 01/26/15 0001    Manual Therapy   Manual Therapy Soft tissue  mobilization;Myofascial release;Other (comment);Internal Pelvic Floor   Manual therapy comments Scar tissue massage mid to lower abdominals   Soft tissue mobilization externally outside of anus   Internal Pelvic Floor annally on bil. levator ani,                 PT Education - 01/26/15 1524    Education provided No          PT Short Term Goals - 01/26/15 1449    PT SHORT TERM GOAL #2   Title ability to produce a pelvic floor contraction anally with a lift   Time 4   Period Weeks   Status On-going           PT Long Term Goals - 01/26/15 1450    PT LONG TERM GOAL #1   Title independent with HEP and understand how to progress herself   Time 12   Period Weeks   Status On-going  learning HEP   PT LONG TERM GOAL #2   Title ability to push out a bowel movement with >/= 50% greater ease due to improve pelvic floor strength 4/5   Time 12   Period Weeks   Status On-going  20% easier   PT LONG TERM GOAL #4   Title ability to have a bowel movement without urinating due to improved coordination of the pelvic floor muscles   Time 12   Period Weeks   Status On-going  25% better   PT LONG TERM GOAL #5   Title ability to wait 2 hours to have a bowel movement or urinating   Time 12   Period Weeks   Status On-going  50% better               Plan - 01/26/15 1525    Clinical Impression Statement Patient is a 54 year old female with 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  Patient pelvic floor strength has increased from 2/5 to 3/5.  Patient needs verbal cues to lift the pelvic floor.  Patient has decreased mobility of right diaphragm and bil. sides of levator ani.  Patient reports her bowel movement are longer in size.  Patient reports cleaner wipe of bowel movement.  Patient would continue to require physical therapy to improve bowel movments, improve pelvic floor strenght, and improve ability ot wait 2 hours for a bowel movement.    Pt will  benefit from skilled therapeutic intervention in order to improve on the following deficits Decreased activity tolerance;Decreased mobility;Decreased strength;Impaired sensation;Impaired flexibility;Pain;Decreased endurance;Increased fascial restricitons;Decreased coordination   Rehab Potential Good   Clinical Impairments Affecting Rehab Potential None  PT Frequency 2x / week   PT Duration 12 weeks   PT Treatment/Interventions ADLs/Self Care Home Management;Biofeedback;Functional mobility training;Therapeutic activities;Therapeutic exercise;Neuromuscular re-education;Manual techniques;Patient/family education;Scar mobilization;Passive range of motion   PT Next Visit Plan Pelvic floor EMG, abdominal strength   PT Home Exercise Plan progress as needed   Consulted and Agree with Plan of Care Patient        Problem List Patient Active Problem List   Diagnosis Date Noted  . History of ileostomy 05/02/2014  . DVT (deep venous thrombosis) 01/14/2014  . Urinary tract infection 01/13/2014  . Large bowel perforation 09/13/2013  . Rectal cancer, 12 - 15 cm from anal verge 05/29/2013  . Herniation of cervical intervertebral disc with radiculopathy 01/03/2012    Yomar Mejorado,PT 01/26/2015, 3:29 PM  [facility] [address], [place], [place], [address] Phone: [phone]   Fax:  [phone]

## 2015-01-28 ENCOUNTER — Encounter: Payer: Self-pay | Admitting: Physical Therapy

## 2015-01-28 ENCOUNTER — Ambulatory Visit: Payer: 59 | Admitting: Physical Therapy

## 2015-01-28 DIAGNOSIS — M6289 Other specified disorders of muscle: Secondary | ICD-10-CM

## 2015-01-28 DIAGNOSIS — N8184 Pelvic muscle wasting: Secondary | ICD-10-CM | POA: Diagnosis not present

## 2015-01-28 NOTE — Patient Instructions (Signed)
Level two core exercises:   Lay on back, knees bent: Contract core first, bring the right leg up into table top followed by left leg, hold 2 sec, bring legs down slowly.Keep core contracting throughout, do not change shape of your low back during the exercises; do not arch or "jam" low back down. Think more of a gentle "imprint" of your spine on the mat.   Alternating heel taps:  Same position as above holding table top; tap RT heel down then bring it back to table top, tap LT heel down and back. Keep alternating, BREATHE!!! Keep core contracted throughout. Relax the upper body.   Bicycle legs:  Same position as above but this time do a bicycle motion with your legs: small or big motions, whatever your back feels ok with. BREATHE!!!! Keep belly button pulling in geltly as you continue to relax the upper body.    Ab curl: Arms by your side inhale, exhale as you curl your head and shoulders up looking at your belly button. Relax hips, buttocks and thighs, keep back of neck long. Do 5-8 x.  Then you can add the ab curl to the above leg exercises.

## 2015-01-28 NOTE — Therapy (Signed)
[facility] [address], [place], [place], [address] Phone: [phone]   Fax:  [phone]  Physical Therapy Treatment  Patient Details  Name: [name] MRN: [id number] Date of Birth: Dec 02, 1960 Referring Provider:  Juanda Chance, NP  Encounter Date: 01/28/2015      PT End of Session - 01/28/15 1006    Visit Number 6   Date for PT Re-Evaluation 03/26/15   PT Start Time 0932   PT Stop Time 1015   PT Time Calculation (min) 43 min   Activity Tolerance Patient tolerated treatment well   Behavior During Therapy [facility] for tasks assessed/performed      Past Medical History  Diagnosis Date  . History of radiation therapy 06/18/13-07/26/13    rectal 50.4Gy total dose  . IUD (intrauterine device) in place 08-27-13    Mirena Implant inplace  . Arthritis 08-27-13    at present has ruptured disc- lower back-not an issue now  . PONV (postoperative nausea and vomiting)     scopalamine patch helped  . Hypertension     toxemia with pregnancy 1990  . Anxiety   . UTI (lower urinary tract infection)     hx of   . Blood infection     E Coli after UTI   . DVT (deep venous thrombosis)     left upper extremity currently has stopped xarelto   . Nerve damage     numbness in pelvic area from rectal rection straining to empty bladder   . Stress incontinence   . Colon cancer 08-27-13    radiation /chemo -last ended 4 weeks(Dr. Benay Spice)    Past Surgical History  Procedure Laterality Date  . Back surgery    . Cesarean section      x2  . Cervical disc arthroplasty  01/03/2012    Procedure: CERVICAL ANTERIOR DISC ARTHROPLASTY;  Surgeon: Charlie Pitter, MD;  Location: [place] [facility];  Service: Neurosurgery;  Laterality: N/A;  Cervical Anterior Disc Arthorplasty Five-Six  . Eus N/A 05/31/2013    Procedure: LOWER ENDOSCOPIC ULTRASOUND (EUS);  Surgeon: Beryle Beams, MD;  Location: [facility];  Service: Endoscopy;  Laterality: N/A;   . Intrauterine device insertion  2006    minera  . Knee arthroscopy Left 08-27-13    torn meniscus repair left   . Breast surgery      reduction surgery  . Refractive surgery    . Laparoscopic low anterior resection N/A 09/02/2013    Procedure: LAPAROSCOPIC LOW ANTERIOR RESECTION;  Surgeon: [name], MD;  [facility];  Service: General;  Laterality: N/A;  . Laparotomy N/A 09/13/2013    Procedure: EXPLORATORY LAPAROTOMY / FLEXIBLE SIGMOIDOSCOPY/  DRAINAGE OF PELVIC ABSCESS WITH  DIVERTING  LOOP  ILEOSTOMY;  Surgeon: [name], MD;  [facility];  Service: General;  Laterality: N/A;  . Portacath placement Left 10/11/2013    Procedure: INSERTION PORT-A-CATH;  Surgeon: [name], MD;  [facility];  Service: General;  Laterality: Left;  . Appendectomy    . Bladder study       pressure readings had approx 1.5 months ago   . Ileo loop colostomy closure N/A 05/02/2014    Procedure: LAPAROSCOPIC ASSISTED REVERSAL ILEOSTOMY AND LYSIS OF ADHESIONS;  Surgeon: [name], MD;  [facility];  Service: General;  Laterality: N/A;  . Lysis of adhesion  05/02/2014    Procedure: LYSIS OF ADHESION;  Surgeon: [name], MD;  Location: [facility];  Service: General;;  . Port-a-cath removal Left 06/09/2014    Procedure: REMOVAL PORT-A-CATH  (MINOR PROCEDURE) ;  Surgeon: [name], MD;  Location: [facility];  Service: General;  Laterality: Left;    There were no vitals filed for this visit.  Visit Diagnosis:  PFD (pelvic floor dysfunction)      Subjective Assessment - 01/28/15 0937    Subjective Coming off Cymbalta slowly, causing "brain zaps." Tennis elbow really acting up.    Pain Score 6    Pain Location Elbow   Pain Orientation Right   Pain Descriptors / Indicators Constant   Aggravating Factors  Doing hair   Pain Relieving Factors Ice   Multiple Pain Sites No                         [facility] Adult PT Treatment/Exercise - 01/28/15  0001    Lumbar Exercises: Aerobic   Stationary Bike Nustep L5 x 6 min   Elliptical L3 x 6 min   Lumbar Exercises: Supine   Other Supine Lumbar Exercises half dying bug 10x   Other Supine Lumbar Exercises Alternating heels taps  6x  Bicycle legs 10 sec                PT Education - 01/28/15 0957    Education provided Yes   Education Details HEP level 2 core ex in supine   Person(s) Educated Patient   Methods Explanation;Demonstration;Tactile cues;Verbal cues;Handout   Comprehension Verbalized understanding;Returned demonstration          PT Short Term Goals - 01/26/15 1449    PT SHORT TERM GOAL #2   Title ability to produce a pelvic floor contraction anally with a lift   Time 4   Period Weeks   Status On-going           PT Long Term Goals - 01/26/15 1450    PT LONG TERM GOAL #1   Title independent with HEP and understand how to progress herself   Time 12   Period Weeks   Status On-going  learning HEP   PT LONG TERM GOAL #2   Title ability to push out a bowel movement with >/= 50% greater ease due to improve pelvic floor strength 4/5   Time 12   Period Weeks   Status On-going  20% easier   PT LONG TERM GOAL #4   Title ability to have a bowel movement without urinating due to improved coordination of the pelvic floor muscles   Time 12   Period Weeks   Status On-going  25% better   PT LONG TERM GOAL #5   Title ability to wait 2 hours to have a bowel movement or urinating   Time 12   Period Weeks   Status On-going  50% better               Plan - 01/28/15 1007    Clinical Impression Statement Pt HEP advanced today to include level 2 core exercises. She was able to maintain neutral spine throughout the exercises.    Pt will benefit from skilled therapeutic intervention in order to improve on the following deficits Decreased activity tolerance;Decreased mobility;Decreased strength;Impaired sensation;Impaired flexibility;Pain;Decreased  endurance;Increased fascial restricitons;Decreased coordination   Clinical Impairments Affecting Rehab Potential None   PT Frequency 2x / week   PT Duration 12 weeks   PT Treatment/Interventions ADLs/Self Care Home Management;Biofeedback;Functional mobility training;Therapeutic activities;Therapeutic exercise;Neuromuscular re-education;Manual techniques;Patient/family education;Scar mobilization;Passive  range of motion   PT Next Visit Plan Pelvic floor EMG, abdominal strength   Consulted and Agree with Plan of Care Patient        Problem List Patient Active Problem List   Diagnosis Date Noted  . History of ileostomy 05/02/2014  . DVT (deep venous thrombosis) 01/14/2014  . Urinary tract infection 01/13/2014  . Large bowel perforation 09/13/2013  . Rectal cancer, 12 - 15 cm from anal verge 05/29/2013  . Herniation of cervical intervertebral disc with radiculopathy 01/03/2012    Pinchus Weckwerth, PTA 01/28/2015, 10:11 AM  [facility] [address], [place], [place], [address] Phone: [phone]   Fax:  [phone]

## 2015-01-30 ENCOUNTER — Encounter: Payer: Self-pay | Admitting: Oncology

## 2015-01-30 NOTE — Progress Notes (Signed)
I faxed medical clearance form to  [phone] and mailed copy to patient for her records.

## 2015-02-02 ENCOUNTER — Ambulatory Visit: Payer: 59 | Admitting: Physical Therapy

## 2015-02-02 ENCOUNTER — Encounter: Payer: Self-pay | Admitting: Physical Therapy

## 2015-02-02 DIAGNOSIS — N8184 Pelvic muscle wasting: Secondary | ICD-10-CM | POA: Diagnosis not present

## 2015-02-02 DIAGNOSIS — M6289 Other specified disorders of muscle: Secondary | ICD-10-CM

## 2015-02-02 NOTE — Therapy (Signed)
[facility] [address] [place], [place], [address] Phone: [phone]   Fax:  [phone]  Physical Therapy Treatment  Patient Details  Name: [name] MRN: [id number] Date of Birth: [birth year] Referring Provider:  Juanda Chance, NP  Encounter Date: 02/02/2015      PT End of Session - 02/02/15 0941    Visit Number 7   Date for PT Re-Evaluation 03/26/15   PT Start Time 0930   PT Stop Time 1015   PT Time Calculation (min) 45 min   Activity Tolerance Patient tolerated treatment well   Behavior During Therapy [facility] for tasks assessed/performed      Past Medical History  Diagnosis Date  . History of radiation therapy 06/18/13-07/26/13    rectal 50.4Gy total dose  . IUD (intrauterine device) in place 08-27-13    Mirena Implant inplace  . Arthritis 08-27-13    at present has ruptured disc- lower back-not an issue now  . PONV (postoperative nausea and vomiting)     scopalamine patch helped  . Hypertension     toxemia with pregnancy 1990  . Anxiety   . UTI (lower urinary tract infection)     hx of   . Blood infection     E Coli after UTI   . DVT (deep venous thrombosis)     left upper extremity currently has stopped xarelto   . Nerve damage     numbness in pelvic area from rectal rection straining to empty bladder   . Stress incontinence   . Colon cancer 08-27-13    radiation /chemo -last ended 4 weeks(Dr. Benay Spice)    Past Surgical History  Procedure Laterality Date  . Back surgery    . Cesarean section      x2  . Cervical disc arthroplasty  01/03/2012    Procedure: CERVICAL ANTERIOR DISC ARTHROPLASTY;  Surgeon: Charlie Pitter, MD;  Location: [place] [facility];  Service: Neurosurgery;  Laterality: N/A;  Cervical Anterior Disc Arthorplasty Five-Six  . Eus N/A 05/31/2013    Procedure: LOWER ENDOSCOPIC ULTRASOUND (EUS);  Surgeon: Beryle Beams, MD;  Location: [facility];  Service: Endoscopy;  Laterality: N/A;   . Intrauterine device insertion  2006    minera  . Knee arthroscopy Left 08-27-13    torn meniscus repair left   . Breast surgery      reduction surgery  . Refractive surgery    . Laparoscopic low anterior resection N/A 09/02/2013    Procedure: LAPAROSCOPIC LOW ANTERIOR RESECTION;  Surgeon: [name], MD;  [facility]: [facility];  Service: General;  Laterality: N/A;  . Laparotomy N/A 09/13/2013    Procedure: EXPLORATORY LAPAROTOMY / FLEXIBLE SIGMOIDOSCOPY/  DRAINAGE OF PELVIC ABSCESS WITH  DIVERTING  LOOP  ILEOSTOMY;  Surgeon: [name], MD;  [facility];  Service: General;  Laterality: N/A;  . Portacath placement Left 10/11/2013    Procedure: INSERTION PORT-A-CATH;  Surgeon: [name], MD;  [facility];  Service: General;  Laterality: Left;  . Appendectomy    . Bladder study       pressure readings had approx 1.5 months ago   . Ileo loop colostomy closure N/A 05/02/2014    Procedure: LAPAROSCOPIC ASSISTED REVERSAL ILEOSTOMY AND LYSIS OF ADHESIONS;  Surgeon: [name], MD;  [facility];  Service: General;  Laterality: N/A;  . Lysis of adhesion  05/02/2014    Procedure: LYSIS OF ADHESION;  Surgeon: [name], MD;  Location: [facility];  Service: General;;  . Port-a-cath removal Left 06/09/2014    Procedure: REMOVAL PORT-A-CATH  (MINOR PROCEDURE) ;  Surgeon: [name], MD;  Location: [place];  Service: General;  Laterality: Left;    There were no vitals filed for this visit.  Visit Diagnosis:  PFD (pelvic floor dysfunction)      Subjective Assessment - 02/02/15 0939    Subjective I walked 7 miles yesterday   Patient Stated Goals ability to push a bowel movement out with greater ease, having fewer bowel movement   Currently in Pain? Yes   Pain Score 3    Pain Location Leg  Hips   Pain Orientation Right;Left   Pain Descriptors / Indicators Constant   Pain Type Chronic pain   Pain Onset 1 to 4 weeks ago   Pain Frequency Constant    Aggravating Factors  exercise   Pain Relieving Factors self remedies   Multiple Pain Sites No            [facility] PT Assessment - 02/02/15 0001    Assessment   Medical Diagnosis Pelvic floor dysfunction; pudendal nerve injury   Onset Date/Surgical Date 05/03/15   Prior Therapy yes                  Pelvic Floor Special Questions - 02/02/15 0001    Pelvic Floor Internal Exam Patient confirms identification and approves therapist to assess muscle strength and muscle integrity   Exam Type Vaginal   Strength weak squeeze, no lift  vaginally           [facility] Adult PT Treatment/Exercise - 02/02/15 0001    Manual Therapy   Manual Therapy Internal Pelvic Floor   Internal Pelvic Floor vaginally to bilterally to rectal area, bil. sides of urethra, bil. levator ani, bil. puborectalis                PT Education - 02/02/15 1012    Education provided No          PT Short Term Goals - 01/26/15 1449    PT SHORT TERM GOAL #2   Title ability to produce a pelvic floor contraction anally with a lift   Time 4   Period Weeks   Status On-going           PT Long Term Goals - 02/02/15 2297    PT LONG TERM GOAL #1   Title independent with HEP and understand how to progress herself   Time 12   Period Weeks   Status On-going  still getting exercise   PT LONG TERM GOAL #2   Title ability to push out a bowel movement with >/= 50% greater ease due to improve pelvic floor strength 4/5   Time 12   Period Weeks   Status On-going  20% easier   PT LONG TERM GOAL #3   Title abdominal strength 4/5 to assist in fully emptying her bladder and bowels   Time 12   Period Weeks   Status On-going   PT LONG TERM GOAL #4   Title ability to have a bowel movement without urinating due to improved coordination of the pelvic floor muscles   Time 12   Period Weeks   Status On-going  20% better   PT LONG TERM GOAL #5   Title ability to wait 2 hours to have a bowel movement or  urinating   Time 12   Period Weeks   Status Achieved  Plan - 02/02/15 1012    Clinical Impression Statement Patient is a 54 year old female with 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  Patient pelvic floor strength vaginally is 2/5 with most lift posteriorly.  Decreased tissue mobility of bil. side of rectum, bil. sides of urethra, bil. levator ani. Patient reports she is able to now wait 2 hour before going to the bathroom again. Patient reports she is able to push bowels out for 20% better. Patient has met LTG #5. Patient would benefit from phsyical therapy to improve pelvic floor strength.    Pt will benefit from skilled therapeutic intervention in order to improve on the following deficits Decreased activity tolerance;Decreased mobility;Decreased strength;Impaired sensation;Impaired flexibility;Pain;Decreased endurance;Increased fascial restricitons;Decreased coordination   Rehab Potential Good   Clinical Impairments Affecting Rehab Potential None   PT Frequency 2x / week   PT Duration 12 weeks   PT Treatment/Interventions ADLs/Self Care Home Management;Biofeedback;Functional mobility training;Therapeutic activities;Therapeutic exercise;Neuromuscular re-education;Manual techniques;Patient/family education;Scar mobilization;Passive range of motion   PT Next Visit Plan Pelvic floor EMG, abdominal strength   PT Home Exercise Plan progress as needed   Consulted and Agree with Plan of Care Patient        Problem List Patient Active Problem List   Diagnosis Date Noted  . History of ileostomy 05/02/2014  . DVT (deep venous thrombosis) 01/14/2014  . Urinary tract infection 01/13/2014  . Large bowel perforation 09/13/2013  . Rectal cancer, 12 - 15 cm from anal verge 05/29/2013  . Herniation of cervical intervertebral disc with radiculopathy 01/03/2012    GRAY,CHERYL,PT 02/02/2015, 10:18 AM  [facility] [address], [place], [place], [address] Phone: [phone]   Fax:  [phone]

## 2015-02-06 ENCOUNTER — Ambulatory Visit: Payer: 59 | Admitting: Physical Therapy

## 2015-02-06 ENCOUNTER — Encounter: Payer: Self-pay | Admitting: Physical Therapy

## 2015-02-06 DIAGNOSIS — N8184 Pelvic muscle wasting: Secondary | ICD-10-CM | POA: Diagnosis not present

## 2015-02-06 DIAGNOSIS — M6289 Other specified disorders of muscle: Secondary | ICD-10-CM

## 2015-02-06 NOTE — Therapy (Signed)
[facility] [address] [place], [place], [address] Phone: [phone]   Fax:  [phone]  Physical Therapy Treatment  Patient Details  Name: [name] MRN: [id number] Date of Birth: 01/05/1961 Referring Provider:  Juanda Chance, NP  Encounter Date: 02/06/2015      PT End of Session - 02/06/15 0950    Visit Number 8   Date for PT Re-Evaluation 03/26/15   PT Start Time 0931   PT Stop Time 1010   PT Time Calculation (min) 39 min   Activity Tolerance Patient tolerated treatment well   Behavior During Therapy [facility] for tasks assessed/performed      Past Medical History  Diagnosis Date  . History of radiation therapy 06/18/13-07/26/13    rectal 50.4Gy total dose  . IUD (intrauterine device) in place 08-27-13    Mirena Implant inplace  . Arthritis 08-27-13    at present has ruptured disc- lower back-not an issue now  . PONV (postoperative nausea and vomiting)     scopalamine patch helped  . Hypertension     toxemia with pregnancy 1990  . Anxiety   . UTI (lower urinary tract infection)     hx of   . Blood infection     E Coli after UTI   . DVT (deep venous thrombosis)     left upper extremity currently has stopped xarelto   . Nerve damage     numbness in pelvic area from rectal rection straining to empty bladder   . Stress incontinence   . Colon cancer 08-27-13    radiation /chemo -last ended 4 weeks(Dr. Benay Spice)    Past Surgical History  Procedure Laterality Date  . Back surgery    . Cesarean section      x2  . Cervical disc arthroplasty  01/03/2012    Procedure: CERVICAL ANTERIOR DISC ARTHROPLASTY;  Surgeon: Charlie Pitter, MD;  Location: [place] [facility];  Service: Neurosurgery;  Laterality: N/A;  Cervical Anterior Disc Arthorplasty Five-Six  . Eus N/A 05/31/2013    Procedure: LOWER ENDOSCOPIC ULTRASOUND (EUS);  Surgeon: Beryle Beams, MD;  Location: [facility];  Service: Endoscopy;  Laterality: N/A;   . Intrauterine device insertion  2006    minera  . Knee arthroscopy Left 08-27-13    torn meniscus repair left   . Breast surgery      reduction surgery  . Refractive surgery    . Laparoscopic low anterior resection N/A 09/02/2013    Procedure: LAPAROSCOPIC LOW ANTERIOR RESECTION;  Surgeon: [name], MD;  [facility];  Service: General;  Laterality: N/A;  . Laparotomy N/A 09/13/2013    Procedure: EXPLORATORY LAPAROTOMY / FLEXIBLE SIGMOIDOSCOPY/  DRAINAGE OF PELVIC ABSCESS WITH  DIVERTING  LOOP  ILEOSTOMY;  Surgeon: [name], MD;  [facility];  Service: General;  Laterality: N/A;  . Portacath placement Left 10/11/2013    Procedure: INSERTION PORT-A-CATH;  Surgeon: [name], MD;  [facility];  Service: General;  Laterality: Left;  . Appendectomy    . Bladder study       pressure readings had approx 1.5 months ago   . Ileo loop colostomy closure N/A 05/02/2014    Procedure: LAPAROSCOPIC ASSISTED REVERSAL ILEOSTOMY AND LYSIS OF ADHESIONS;  Surgeon: [name], MD;  [facility];  Service: General;  Laterality: N/A;  . Lysis of adhesion  05/02/2014    Procedure: LYSIS OF ADHESION;  Surgeon: [name], MD;  Location: [facility];  Service: General;;  . Port-a-cath removal Left 06/09/2014    Procedure: REMOVAL PORT-A-CATH  (MINOR PROCEDURE) ;  Surgeon: [name], MD;  Location: [place];  Service: General;  Laterality: Left;    There were no vitals filed for this visit.  Visit Diagnosis:  PFD (pelvic floor dysfunction)      Subjective Assessment - 02/06/15 0933    Subjective I'm so sore and exhausted from all my working out!   Currently in Pain? --  Sore muscles and pelvis/rectum   Pain Score 3    Aggravating Factors  Too much exercise   Multiple Pain Sites No                         [facility] Adult PT Treatment/Exercise - 02/06/15 0001    Lumbar Exercises: Supine   Other Supine Lumbar Exercises Soft foam roll  decompression protocol   Breathing with TA 10x, MArching, Scissor arns, hug tree,    Other Supine Lumbar Exercises LE release protocol with roll                  PT Short Term Goals - 01/26/15 1449    PT SHORT TERM GOAL #2   Title ability to produce a pelvic floor contraction anally with a lift   Time 4   Period Weeks   Status On-going           PT Long Term Goals - 02/02/15 3785    PT LONG TERM GOAL #1   Title independent with HEP and understand how to progress herself   Time 12   Period Weeks   Status On-going  still getting exercise   PT LONG TERM GOAL #2   Title ability to push out a bowel movement with >/= 50% greater ease due to improve pelvic floor strength 4/5   Time 12   Period Weeks   Status On-going  20% easier   PT LONG TERM GOAL #3   Title abdominal strength 4/5 to assist in fully emptying her bladder and bowels   Time 12   Period Weeks   Status On-going   PT LONG TERM GOAL #4   Title ability to have a bowel movement without urinating due to improved coordination of the pelvic floor muscles   Time 12   Period Weeks   Status On-going  20% better   PT LONG TERM GOAL #5   Title ability to wait 2 hours to have a bowel movement or urinating   Time 12   Period Weeks   Status Achieved               Plan - 02/06/15 1006    Clinical Impression Statement Pt is probably exercising too much after hearing her reports of being exhausted and sore all the time. We spoke about pacing herself more and not doing her walking AND [employer] program on the same day.  She agreed. Today worked mostly on fascial restrictions in her soine and LE.    Pt will benefit from skilled therapeutic intervention in order to improve on the following deficits Decreased activity tolerance;Decreased mobility;Decreased strength;Impaired sensation;Impaired flexibility;Pain;Decreased endurance;Increased fascial restricitons;Decreased coordination   Rehab Potential Good    Clinical Impairments Affecting Rehab Potential None   PT Frequency 2x / week   PT Duration 12 weeks   PT Treatment/Interventions ADLs/Self Care Home Management;Biofeedback;Functional mobility training;Therapeutic activities;Therapeutic exercise;Neuromuscular re-education;Manual techniques;Patient/family education;Scar mobilization;Passive range of motion  PT Next Visit Plan Pelvic floor strength, see how she felt after the soft foam roll fascial lenghtening protocol.    Consulted and Agree with Plan of Care Patient        Problem List Patient Active Problem List   Diagnosis Date Noted  . History of ileostomy 05/02/2014  . DVT (deep venous thrombosis) 01/14/2014  . Urinary tract infection 01/13/2014  . Large bowel perforation 09/13/2013  . Rectal cancer, 12 - 15 cm from anal verge 05/29/2013  . Herniation of cervical intervertebral disc with radiculopathy 01/03/2012    Denaly Gatling, PTA 02/06/2015, 10:09 AM   [facility] [address], [place], [place], [address] Phone: [phone]   Fax:  [phone]

## 2015-02-11 ENCOUNTER — Encounter: Payer: Self-pay | Admitting: Physical Therapy

## 2015-02-11 ENCOUNTER — Ambulatory Visit: Payer: 59 | Admitting: Physical Therapy

## 2015-02-11 DIAGNOSIS — M6289 Other specified disorders of muscle: Secondary | ICD-10-CM

## 2015-02-11 DIAGNOSIS — N8184 Pelvic muscle wasting: Secondary | ICD-10-CM | POA: Diagnosis not present

## 2015-02-11 NOTE — Therapy (Signed)
[facility] [address], [place], [place], [address] Phone: [phone]   Fax:  [phone]  Physical Therapy Treatment  Patient Details  Name: [name] MRN: [id number] Date of Birth: [birth year] Referring Provider:  Juanda Chance, NP  Encounter Date: 02/11/2015      PT End of Session - 02/11/15 0945    Visit Number 9   Date for PT Re-Evaluation 03/26/15   PT Start Time 0931   PT Stop Time 1011   PT Time Calculation (min) 40 min   Activity Tolerance Patient tolerated treatment well   Behavior During Therapy [facility] for tasks assessed/performed      Past Medical History  Diagnosis Date  . History of radiation therapy 06/18/13-07/26/13    rectal 50.4Gy total dose  . IUD (intrauterine device) in place 08-27-13    Mirena Implant inplace  . Arthritis 08-27-13    at present has ruptured disc- lower back-not an issue now  . PONV (postoperative nausea and vomiting)     scopalamine patch helped  . Hypertension     toxemia with pregnancy 1990  . Anxiety   . UTI (lower urinary tract infection)     hx of   . Blood infection     E Coli after UTI   . DVT (deep venous thrombosis)     left upper extremity currently has stopped xarelto   . Nerve damage     numbness in pelvic area from rectal rection straining to empty bladder   . Stress incontinence   . Colon cancer 08-27-13    radiation /chemo -last ended 4 weeks(Dr. Benay Spice)    Past Surgical History  Procedure Laterality Date  . Back surgery    . Cesarean section      x2  . Cervical disc arthroplasty  01/03/2012    Procedure: CERVICAL ANTERIOR DISC ARTHROPLASTY;  Surgeon: Charlie Pitter, MD;  Location: [place] [facility];  Service: Neurosurgery;  Laterality: N/A;  Cervical Anterior Disc Arthorplasty Five-Six  . Eus N/A 05/31/2013    Procedure: LOWER ENDOSCOPIC ULTRASOUND (EUS);  Surgeon: Beryle Beams, MD;  Location: [facility];  Service: Endoscopy;  Laterality: N/A;   . Intrauterine device insertion  2006    minera  . Knee arthroscopy Left 08-27-13    torn meniscus repair left   . Breast surgery      reduction surgery  . Refractive surgery    . Laparoscopic low anterior resection N/A 09/02/2013    Procedure: LAPAROSCOPIC LOW ANTERIOR RESECTION;  Surgeon: [name], MD;  [facility];  Service: General;  Laterality: N/A;  . Laparotomy N/A 09/13/2013    Procedure: EXPLORATORY LAPAROTOMY / FLEXIBLE SIGMOIDOSCOPY/  DRAINAGE OF PELVIC ABSCESS WITH  DIVERTING  LOOP  ILEOSTOMY;  Surgeon: [name], MD;  [facility];  Service: General;  Laterality: N/A;  . Portacath placement Left 10/11/2013    Procedure: INSERTION PORT-A-CATH;  Surgeon: [name], MD;  [facility];  Service: General;  Laterality: Left;  . Appendectomy    . Bladder study       pressure readings had approx 1.5 months ago   . Ileo loop colostomy closure N/A 05/02/2014    Procedure: LAPAROSCOPIC ASSISTED REVERSAL ILEOSTOMY AND LYSIS OF ADHESIONS;  Surgeon: [name], MD;  [facility];  Service: General;  Laterality: N/A;  . Lysis of adhesion  05/02/2014    Procedure: LYSIS OF ADHESION;  Surgeon: [name], MD;  Location: [facility];  Service: General;;  . Port-a-cath removal Left 06/09/2014    Procedure: REMOVAL PORT-A-CATH  (MINOR PROCEDURE) ;  Surgeon: [name], MD;  Location: [facility];  Service: General;  Laterality: Left;    There were no vitals filed for this visit.  Visit Diagnosis:  PFD (pelvic floor dysfunction)      Subjective Assessment - 02/11/15 0942    Subjective I have been doing the foam roll exercises.   Patient Stated Goals ability to push a bowel movement out with greater ease, having fewer bowel movement   Currently in Pain? Yes   Pain Score 4    Pain Location Rectum   Pain Orientation Mid   Pain Descriptors / Indicators Constant   Pain Onset 1 to 4 weeks ago   Pain Frequency Constant   Aggravating Factors   bowel movement   Pain Relieving Factors no bowel movement   Multiple Pain Sites No                      Pelvic Floor Special Questions - 02/11/15 0001    Pelvic Floor Internal Exam Patient confirms identification and approves therapist to assess muscle strength and muscle integrity   Exam Type Vaginal   Strength fair squeeze, definite lift  vaginal           [facility] Adult PT Treatment/Exercise - 02/11/15 0001    Lumbar Exercises: Aerobic   Elliptical L3 x7 min   Lumbar Exercises: Supine   Other Supine Lumbar Exercises Soft foam roll decompression protocol   Breathing with TA 10x, MArching, Scissor arns, hug tree,    Other Supine Lumbar Exercises LE release protocol with roll   Manual Therapy   Manual Therapy Internal Pelvic Floor   Internal Pelvic Floor vaginally to bilterally to rectal area, bil. sides of urethra, bil. levator ani, bil. puborectalis                PT Education - 02/11/15 1009    Education provided No          PT Short Term Goals - 02/11/15 1014    PT SHORT TERM GOAL #1   Title ability to contract abdominal muscles correctly to assist in bowel defecation   Time 4   Period Weeks   Status Achieved   PT SHORT TERM GOAL #2   Title ability to produce a pelvic floor contraction anally with a lift   Time 4   Period Weeks   Status Achieved   PT SHORT TERM GOAL #3   Title independent with scar tissue massage to do at home   Time 4   Period Weeks   Status Achieved   PT SHORT TERM GOAL #4   Title posture awarness to reduce tension in pelvic floor   Time 4   Period Weeks   Status Achieved           PT Long Term Goals - 02/02/15 9509    PT LONG TERM GOAL #1   Title independent with HEP and understand how to progress herself   Time 12   Period Weeks   Status On-going  still getting exercise   PT LONG TERM GOAL #2   Title ability to push out a bowel movement with >/= 50% greater ease due to improve pelvic floor strength 4/5    Time 12   Period Weeks   Status On-going  20% easier   PT LONG TERM GOAL #3  Title abdominal strength 4/5 to assist in fully emptying her bladder and bowels   Time 12   Period Weeks   Status On-going   PT LONG TERM GOAL #4   Title ability to have a bowel movement without urinating due to improved coordination of the pelvic floor muscles   Time 12   Period Weeks   Status On-going  20% better   PT LONG TERM GOAL #5   Title ability to wait 2 hours to have a bowel movement or urinating   Time 12   Period Weeks   Status Achieved               Plan - 02/11/15 1010    Clinical Impression Statement Patient is a 54 year old female with rectal cancer.  Patient has decreased pelvic floor strength at 3/5 with circular and slight lift. Patient is exercising regularly. Patient reports she is able to have a longer bowel movement and  easier to push bowels out.  Patient reports she is able to fully empty her bladder.  Patient is able to urniate without having a bowel movement at times. Patient can push a bowel movement out with 40%  greater ease.  Patient has increase mobility of the rectal area.  Patient would benefit fromp hysical therapy to improve pelvic floor strength and tissue mobility.    Pt will benefit from skilled therapeutic intervention in order to improve on the following deficits Decreased activity tolerance;Decreased mobility;Decreased strength;Impaired sensation;Impaired flexibility;Pain;Decreased endurance;Increased fascial restricitons;Decreased coordination   Rehab Potential Good   Clinical Impairments Affecting Rehab Potential None   PT Duration 12 weeks   PT Treatment/Interventions ADLs/Self Care Home Management;Biofeedback;Functional mobility training;Therapeutic activities;Therapeutic exercise;Neuromuscular re-education;Manual techniques;Patient/family education;Scar mobilization;Passive range of motion   PT Next Visit Plan soft tissue work internally, elongation of  fascia; core stabilization   PT Home Exercise Plan progress as needed   Consulted and Agree with Plan of Care Patient        Problem List Patient Active Problem List   Diagnosis Date Noted  . History of ileostomy 05/02/2014  . DVT (deep venous thrombosis) 01/14/2014  . Urinary tract infection 01/13/2014  . Large bowel perforation 09/13/2013  . Rectal cancer, 12 - 15 cm from anal verge 05/29/2013  . Herniation of cervical intervertebral disc with radiculopathy 01/03/2012    Kevonte Vanecek,PT 02/11/2015, 10:15 AM  [facility] [address], [place], [place], [address] Phone: [phone]   Fax:  [phone]

## 2015-02-13 ENCOUNTER — Encounter: Payer: Self-pay | Admitting: Physical Therapy

## 2015-02-13 ENCOUNTER — Ambulatory Visit: Payer: 59 | Admitting: Physical Therapy

## 2015-02-13 DIAGNOSIS — N8184 Pelvic muscle wasting: Secondary | ICD-10-CM | POA: Diagnosis not present

## 2015-02-13 DIAGNOSIS — M6289 Other specified disorders of muscle: Secondary | ICD-10-CM

## 2015-02-13 NOTE — Therapy (Signed)
[facility] [address], [place], [place], [address] Phone: [phone]   Fax:  [phone]  Physical Therapy Treatment  Patient Details  Name: [name] MRN: [id number] Date of Birth: 17-Nov-1960 Referring Provider:  Juanda Chance, NP  Encounter Date: 02/13/2015      PT End of Session - 02/13/15 1007    Visit Number 10   Date for PT Re-Evaluation 03/26/15   PT Start Time 0934   PT Stop Time 1015   PT Time Calculation (min) 41 min   Activity Tolerance Patient tolerated treatment well   Behavior During Therapy [facility] for tasks assessed/performed      Past Medical History  Diagnosis Date  . History of radiation therapy 06/18/13-07/26/13    rectal 50.4Gy total dose  . IUD (intrauterine device) in place 08-27-13    Mirena Implant inplace  . Arthritis 08-27-13    at present has ruptured disc- lower back-not an issue now  . PONV (postoperative nausea and vomiting)     scopalamine patch helped  . Hypertension     toxemia with pregnancy 1990  . Anxiety   . UTI (lower urinary tract infection)     hx of   . Blood infection     E Coli after UTI   . DVT (deep venous thrombosis)     left upper extremity currently has stopped xarelto   . Nerve damage     numbness in pelvic area from rectal rection straining to empty bladder   . Stress incontinence   . Colon cancer 08-27-13    radiation /chemo -last ended 4 weeks(Dr. Benay Spice)    Past Surgical History  Procedure Laterality Date  . Back surgery    . Cesarean section      x2  . Cervical disc arthroplasty  01/03/2012    Procedure: CERVICAL ANTERIOR DISC ARTHROPLASTY;  Surgeon: Charlie Pitter, MD;  Location: [facility];  Service: Neurosurgery;  Laterality: N/A;  Cervical Anterior Disc Arthorplasty Five-Six  . Eus N/A 05/31/2013    Procedure: LOWER ENDOSCOPIC ULTRASOUND (EUS);  Surgeon: Beryle Beams, MD;  Location: [facility];  Service: Endoscopy;  Laterality:  N/A;  . Intrauterine device insertion  2006    minera  . Knee arthroscopy Left 08-27-13    torn meniscus repair left   . Breast surgery      reduction surgery  . Refractive surgery    . Laparoscopic low anterior resection N/A 09/02/2013    Procedure: LAPAROSCOPIC LOW ANTERIOR RESECTION;  Surgeon: [name], MD;  Location: [facility];  Service: General;  Laterality: N/A;  . Laparotomy N/A 09/13/2013    Procedure: EXPLORATORY LAPAROTOMY / FLEXIBLE SIGMOIDOSCOPY/  DRAINAGE OF PELVIC ABSCESS WITH  DIVERTING  LOOP  ILEOSTOMY;  Surgeon: [name], MD;  Location: [facility];  Service: General;  Laterality: N/A;  . Portacath placement Left 10/11/2013    Procedure: INSERTION PORT-A-CATH;  Surgeon: [name], MD;  [facility]: [facility];  Service: General;  Laterality: Left;  . Appendectomy    . Bladder study       pressure readings had approx 1.5 months ago   . Ileo loop colostomy closure N/A 05/02/2014    Procedure: LAPAROSCOPIC ASSISTED REVERSAL ILEOSTOMY AND LYSIS OF ADHESIONS;  Surgeon: [name], MD;  [facility]: [facility];  Service: General;  Laterality: N/A;  . Lysis of adhesion  05/02/2014    Procedure: LYSIS OF ADHESION;  Surgeon: [name], MD;  Location: [facility];  Service: General;;  . Port-a-cath removal Left 06/09/2014    Procedure: REMOVAL PORT-A-CATH  (MINOR PROCEDURE) ;  Surgeon: [name], MD;  Location: [facility];  Service: General;  Laterality: Left;    There were no vitals filed for this visit.  Visit Diagnosis:  PFD (pelvic floor dysfunction)      Subjective Assessment - 02/13/15 0939    Subjective Pt learning how to balance her exercise schedule better, not as sore as she was last week. Walked this AM 6 miles in 1 1/2.   Currently in Pain? Yes   Pain Score 7    Pain Location Elbow   Pain Orientation Right   Pain Descriptors / Indicators Constant   Aggravating Factors  using RT arm    Pain Relieving Factors Modified activity   Multiple Pain Sites No                          [facility] Adult PT Treatment/Exercise - 02/13/15 0001    Lumbar Exercises: Supine   Other Supine Lumbar Exercises Soft foam roll decompression protocol   Breathing with TA 10x, MArching, Scissor arns, hug tree,    Other Supine Lumbar Exercises LE release protocol with roll  Pelvic tilts on foam rol 8x                 PT Education - 02/13/15 0955    Education provided Yes   Education Details Foam roll release and core exercises   Person(s) Educated Patient   Methods Explanation;Demonstration;Tactile cues;Verbal cues;Handout   Comprehension Verbalized understanding;Returned demonstration          PT Short Term Goals - 02/11/15 1014    PT SHORT TERM GOAL #1   Title ability to contract abdominal muscles correctly to assist in bowel defecation   Time 4   Period Weeks   Status Achieved   PT SHORT TERM GOAL #2   Title ability to produce a pelvic floor contraction anally with a lift   Time 4   Period Weeks   Status Achieved   PT SHORT TERM GOAL #3   Title independent with scar tissue massage to do at home   Time 4   Period Weeks   Status Achieved   PT SHORT TERM GOAL #4   Title posture awarness to reduce tension in pelvic floor   Time 4   Period Weeks   Status Achieved           PT Long Term Goals - 02/02/15 1610    PT LONG TERM GOAL #1   Title independent with HEP and understand how to progress herself   Time 12   Period Weeks   Status On-going  still getting exercise   PT LONG TERM GOAL #2   Title ability to push out a bowel movement with >/= 50% greater ease due to improve pelvic floor strength 4/5   Time 12   Period Weeks   Status On-going  20% easier   PT LONG TERM GOAL #3   Title abdominal strength 4/5 to assist in fully emptying her bladder and bowels   Time 12   Period Weeks   Status On-going   PT LONG TERM GOAL #4   Title ability to have a bowel movement without urinating due to improved coordination of  the pelvic floor muscles   Time 12   Period Weeks   Status On-going  20% better   PT  LONG TERM GOAL #5   Title ability to wait 2 hours to have a bowel movement or urinating   Time 12   Period Weeks   Status Achieved               Plan - 02/13/15 1007    Clinical Impression Statement Pt is performing her release work on foam roll independently with good technique after observing today. Most pain today is her RT elbow which sh has not called MD about just yet.    Pt will benefit from skilled therapeutic intervention in order to improve on the following deficits Decreased activity tolerance;Decreased mobility;Decreased strength;Impaired sensation;Impaired flexibility;Pain;Decreased endurance;Increased fascial restricitons;Decreased coordination   Rehab Potential Good   Clinical Impairments Affecting Rehab Potential None   PT Frequency 2x / week   PT Treatment/Interventions ADLs/Self Care Home Management;Biofeedback;Functional mobility training;Therapeutic activities;Therapeutic exercise;Neuromuscular re-education;Manual techniques;Patient/family education;Scar mobilization;Passive range of motion   PT Next Visit Plan fascial work, core stabs   Consulted and Agree with Plan of Care Patient        Problem List Patient Active Problem List   Diagnosis Date Noted  . History of ileostomy 05/02/2014  . DVT (deep venous thrombosis) 01/14/2014  . Urinary tract infection 01/13/2014  . Large bowel perforation 09/13/2013  . Rectal cancer, 12 - 15 cm from anal verge 05/29/2013  . Herniation of cervical intervertebral disc with radiculopathy 01/03/2012    Georgi Tuel, PTA 02/13/2015, 10:14 AM  [facility] [address], [place], [place], [address] Phone: [phone]   Fax:  [phone]

## 2015-02-13 NOTE — Patient Instructions (Signed)
Foam roll protocol  1. Lay on roll, head to tailbone.  - Relax and breathe 1-2 min, bring heaviness to roll  - scissor arms, RT arm reaches overhead, stretching gently, then switch arms 5-10 x SLOWLY  - Hug tree: arms at T position, Arms come together, then stretch back out to the side. Breathe!!  - Rocking small ROM side to side, slowly 10x    - Breathe with lower abdominal contraction 10x: Hold 10th and proceed into slow marching 5-8 x each leg.    Lay flat and assess    2. Put roll under tailbone, feet flat   - Breathe and relax  - Pull RT knee to chest to stretch LT quad, breathe and relax 3 breaths  - Both knees up, thighs fairly straight/ perpendicular to ground; rocking side to side slowly 5x, then do circles 5x each diretcion  - Put hands on knees, elbows straight, shoulders relaxed: push knees into hands and perform small, slow pelvic tilts forward and back 5-8x.   Lay flat and assess

## 2015-02-16 ENCOUNTER — Encounter: Payer: Self-pay | Admitting: Physical Therapy

## 2015-02-16 ENCOUNTER — Ambulatory Visit: Payer: 59 | Admitting: Physical Therapy

## 2015-02-16 DIAGNOSIS — M6289 Other specified disorders of muscle: Secondary | ICD-10-CM

## 2015-02-16 DIAGNOSIS — N8184 Pelvic muscle wasting: Secondary | ICD-10-CM | POA: Diagnosis not present

## 2015-02-16 NOTE — Therapy (Signed)
[facility] [address], [place], [place], [address] Phone: [phone]   Fax:  [phone]  Physical Therapy Treatment  Patient Details  Name: [name] MRN: [id number] Date of Birth: December 16, 1960 Referring Provider:  Juanda Chance, NP  Encounter Date: 02/16/2015      PT End of Session - 02/16/15 0941    Visit Number 11   Date for PT Re-Evaluation 03/26/15   PT Start Time 0930   PT Stop Time 1010   PT Time Calculation (min) 40 min   Activity Tolerance Patient tolerated treatment well   Behavior During Therapy [facility] for tasks assessed/performed      Past Medical History  Diagnosis Date  . History of radiation therapy 06/18/13-07/26/13    rectal 50.4Gy total dose  . IUD (intrauterine device) in place 08-27-13    Mirena Implant inplace  . Arthritis 08-27-13    at present has ruptured disc- lower back-not an issue now  . PONV (postoperative nausea and vomiting)     scopalamine patch helped  . Hypertension     toxemia with pregnancy 1990  . Anxiety   . UTI (lower urinary tract infection)     hx of   . Blood infection     E Coli after UTI   . DVT (deep venous thrombosis)     left upper extremity currently has stopped xarelto   . Nerve damage     numbness in pelvic area from rectal rection straining to empty bladder   . Stress incontinence   . Colon cancer 08-27-13    radiation /chemo -last ended 4 weeks(Dr. Benay Spice)    Past Surgical History  Procedure Laterality Date  . Back surgery    . Cesarean section      x2  . Cervical disc arthroplasty  01/03/2012    Procedure: CERVICAL ANTERIOR DISC ARTHROPLASTY;  Surgeon: Charlie Pitter, MD;  Location: [facility];  Service: Neurosurgery;  Laterality: N/A;  Cervical Anterior Disc Arthorplasty Five-Six  . Eus N/A 05/31/2013    Procedure: LOWER ENDOSCOPIC ULTRASOUND (EUS);  Surgeon: Beryle Beams, MD;  Location: [facility];  Service: Endoscopy;  Laterality:  N/A;  . Intrauterine device insertion  2006    minera  . Knee arthroscopy Left 08-27-13    torn meniscus repair left   . Breast surgery      reduction surgery  . Refractive surgery    . Laparoscopic low anterior resection N/A 09/02/2013    Procedure: LAPAROSCOPIC LOW ANTERIOR RESECTION;  Surgeon: [name], MD;  [facility]: [facility];  Service: General;  Laterality: N/A;  . Laparotomy N/A 09/13/2013    Procedure: EXPLORATORY LAPAROTOMY / FLEXIBLE SIGMOIDOSCOPY/  DRAINAGE OF PELVIC ABSCESS WITH  DIVERTING  LOOP  ILEOSTOMY;  Surgeon: [name], MD;  [facility]: [facility];  Service: General;  Laterality: N/A;  . Portacath placement Left 10/11/2013    Procedure: INSERTION PORT-A-CATH;  Surgeon: [name], MD;  [facility];  Service: General;  Laterality: Left;  . Appendectomy    . Bladder study       pressure readings had approx 1.5 months ago   . Ileo loop colostomy closure N/A 05/02/2014    Procedure: LAPAROSCOPIC ASSISTED REVERSAL ILEOSTOMY AND LYSIS OF ADHESIONS;  Surgeon: [name], MD;  [facility]: [facility];  Service: General;  Laterality: N/A;  . Lysis of adhesion  05/02/2014    Procedure: LYSIS OF ADHESION;  Surgeon: [name], MD;  Location: [facility];  Service: General;;  . Port-a-cath removal Left 06/09/2014    Procedure: REMOVAL PORT-A-CATH  (MINOR PROCEDURE) ;  Surgeon: [name], MD;  Location: [facility];  Service: General;  Laterality: Left;    There were no vitals filed for this visit.  Visit Diagnosis:  PFD (pelvic floor dysfunction)      Subjective Assessment - 02/16/15 0937    Subjective I saw Dr. Amalia Hailey and he instructed me on how to use my stim device a different way. I twisted my ankle and it hurts. Massage from therapy helped me go to the bathroom better. Patient reports she is going to have a bowel movement 8 times instead of 12.  I wake  up  1 time compared to 2 times.    Patient Stated Goals ability to push a bowel movement out with  greater ease, having fewer bowel movement   Currently in Pain? Yes   Pain Score 3    Pain Location Pelvis   Pain Orientation Mid   Pain Descriptors / Indicators Constant   Pain Type Chronic pain   Pain Onset 1 to 4 weeks ago   Pain Frequency Constant   Aggravating Factors  eating   Pain Relieving Factors modified activity   Multiple Pain Sites No                      Pelvic Floor Special Questions - 02/16/15 0001    Pelvic Floor Internal Exam Patient confirms identification and approves therapist to assess muscle strength and muscle integrity   Exam Type Vaginal   Palpation improved mobility of left side of pelvic floor has improved mobility           [facility] Adult PT Treatment/Exercise - 02/16/15 0001    Manual Therapy   Manual Therapy Internal Pelvic Floor   Soft tissue mobilization abdomen to release the large intestines for improved bowel movement   Internal Pelvic Floor vaginally to bilterally to rectal area, bil. sides of urethra, bil. levator ani, bil. puborectalis                PT Education - 02/16/15 1013    Education provided Yes   Education Details where to get a vaginal massager to perform internal soft tissue work   Forensic psychologist) Educated Patient   Methods Explanation   Comprehension Verbalized understanding          PT Short Term Goals - 02/11/15 1014    PT SHORT TERM GOAL #1   Title ability to contract abdominal muscles correctly to assist in bowel defecation   Time 4   Period Weeks   Status Achieved   PT SHORT TERM GOAL #2   Title ability to produce a pelvic floor contraction anally with a lift   Time 4   Period Weeks   Status Achieved   PT SHORT TERM GOAL #3   Title independent with scar tissue massage to do at home   Time 4   Period Weeks   Status Achieved   PT SHORT TERM GOAL #4   Title posture awarness to reduce tension in pelvic floor   Time 4   Period Weeks   Status Achieved           PT Long Term Goals -  02/16/15 0941    PT LONG TERM GOAL #1   Title independent with HEP and understand how to progress herself   Time 12   Status On-going  still  learning exercise   PT LONG TERM GOAL #2   Title ability to push out a bowel movement with >/= 50% greater ease due to improve pelvic floor strength 4/5   Time 12   Period Weeks   Status On-going  40% easier   PT LONG TERM GOAL #3   Title abdominal strength 4/5 to assist in fully emptying her bladder and bowels   Time 12   Period Weeks   Status On-going  3/5   PT LONG TERM GOAL #4   Title ability to have a bowel movement without urinating due to improved coordination of the pelvic floor muscles   Time 12   Period Weeks   Status On-going  50% of the time   PT LONG TERM GOAL #5   Title ability to wait 2 hours to have a bowel movement or urinating   Time 12   Period Weeks   Status Achieved               Plan - 02/16/15 1013    Clinical Impression Statement Patient now has bowel movements 8 times instead of 12 times.  Patient has to get up 0-1 time per night compared to 2 times to have a bowel movement.  Patient will stop during intercourse due to having to go have a bowel movement.  Abdominal strength is 3/5.  Patient can push a bowel movement out with 40% greater ease.  Patient can have a bowel movement without urinating 50% of the time. Improved soft tissue mobiity on the left pelvic floor after soft tissue work. Patient will benefit from physical therapy to imporve tissue mobility and pelvic floor contraction.    Pt will benefit from skilled therapeutic intervention in order to improve on the following deficits Decreased activity tolerance;Decreased mobility;Decreased strength;Impaired sensation;Impaired flexibility;Pain;Decreased endurance;Increased fascial restricitons;Decreased coordination   Rehab Potential Good   Clinical Impairments Affecting Rehab Potential None   PT Frequency 2x / week   PT Duration 12 weeks   PT  Treatment/Interventions ADLs/Self Care Home Management;Biofeedback;Functional mobility training;Therapeutic activities;Therapeutic exercise;Neuromuscular re-education;Manual techniques;Patient/family education;Scar mobilization;Passive range of motion   PT Next Visit Plan fascial work, core stabs   PT Home Exercise Plan progress as needed   Consulted and Agree with Plan of Care Patient        Problem List Patient Active Problem List   Diagnosis Date Noted  . History of ileostomy 05/02/2014  . DVT (deep venous thrombosis) 01/14/2014  . Urinary tract infection 01/13/2014  . Large bowel perforation 09/13/2013  . Rectal cancer, 12 - 15 cm from anal verge 05/29/2013  . Herniation of cervical intervertebral disc with radiculopathy 01/03/2012    GRAY,CHERYL,PT 02/16/2015, 10:17 AM  [facility] [address], [place], [place], [address] Phone: [phone]   Fax:  [phone]

## 2015-02-18 ENCOUNTER — Ambulatory Visit: Payer: 59 | Admitting: Physical Therapy

## 2015-02-18 ENCOUNTER — Encounter: Payer: Self-pay | Admitting: Physical Therapy

## 2015-02-18 DIAGNOSIS — M6289 Other specified disorders of muscle: Secondary | ICD-10-CM

## 2015-02-18 DIAGNOSIS — N8184 Pelvic muscle wasting: Secondary | ICD-10-CM | POA: Diagnosis not present

## 2015-02-18 NOTE — Therapy (Signed)
[facility] [address] [place], [place], [address] Phone: [phone]   Fax:  [phone]  Physical Therapy Treatment  Patient Details  Name: [name] MRN: [id number] Date of Birth: August 09, 1960 Referring Provider:  Juanda Chance, NP  Encounter Date: 02/18/2015      PT End of Session - 02/18/15 0940    Visit Number 12   Date for PT Re-Evaluation 03/26/15   PT Start Time 0933   PT Stop Time 1015   PT Time Calculation (min) 42 min   Activity Tolerance Patient tolerated treatment well   Behavior During Therapy [facility] for tasks assessed/performed      Past Medical History  Diagnosis Date  . History of radiation therapy 06/18/13-07/26/13    rectal 50.4Gy total dose  . IUD (intrauterine device) in place 08-27-13    Mirena Implant inplace  . Arthritis 08-27-13    at present has ruptured disc- lower back-not an issue now  . PONV (postoperative nausea and vomiting)     scopalamine patch helped  . Hypertension     toxemia with pregnancy 1990  . Anxiety   . UTI (lower urinary tract infection)     hx of   . Blood infection     E Coli after UTI   . DVT (deep venous thrombosis)     left upper extremity currently has stopped xarelto   . Nerve damage     numbness in pelvic area from rectal rection straining to empty bladder   . Stress incontinence   . Colon cancer 08-27-13    radiation /chemo -last ended 4 weeks(Dr. Benay Spice)    Past Surgical History  Procedure Laterality Date  . Back surgery    . Cesarean section      x2  . Cervical disc arthroplasty  01/03/2012    Procedure: CERVICAL ANTERIOR DISC ARTHROPLASTY;  Surgeon: Charlie Pitter, MD;  Location: [facility];  Service: Neurosurgery;  Laterality: N/A;  Cervical Anterior Disc Arthorplasty Five-Six  . Eus N/A 05/31/2013    Procedure: LOWER ENDOSCOPIC ULTRASOUND (EUS);  Surgeon: Beryle Beams, MD;  Location: [facility];  Service: Endoscopy;  Laterality:  N/A;  . Intrauterine device insertion  2006    minera  . Knee arthroscopy Left 08-27-13    torn meniscus repair left   . Breast surgery      reduction surgery  . Refractive surgery    . Laparoscopic low anterior resection N/A 09/02/2013    Procedure: LAPAROSCOPIC LOW ANTERIOR RESECTION;  Surgeon: [name], MD;  [facility]: [facility];  Service: General;  Laterality: N/A;  . Laparotomy N/A 09/13/2013    Procedure: EXPLORATORY LAPAROTOMY / FLEXIBLE SIGMOIDOSCOPY/  DRAINAGE OF PELVIC ABSCESS WITH  DIVERTING  LOOP  ILEOSTOMY;  Surgeon: [name], MD;  [facility]: [facility];  Service: General;  Laterality: N/A;  . Portacath placement Left 10/11/2013    Procedure: INSERTION PORT-A-CATH;  Surgeon: [name], MD;  [facility];  Service: General;  Laterality: Left;  . Appendectomy    . Bladder study       pressure readings had approx 1.5 months ago   . Ileo loop colostomy closure N/A 05/02/2014    Procedure: LAPAROSCOPIC ASSISTED REVERSAL ILEOSTOMY AND LYSIS OF ADHESIONS;  Surgeon: [name], MD;  [facility];  Service: General;  Laterality: N/A;  . Lysis of adhesion  05/02/2014    Procedure: LYSIS OF ADHESION;  Surgeon: [name], MD;  Location: [facility];  Service: General;;  . Port-a-cath removal Left 06/09/2014    Procedure: REMOVAL PORT-A-CATH  (MINOR PROCEDURE) ;  Surgeon: [name], MD;  Location: [facility];  Service: General;  Laterality: Left;    There were no vitals filed for this visit.  Visit Diagnosis:  PFD (pelvic floor dysfunction)      Subjective Assessment - 02/18/15 0938    Subjective Seeing MD on Friday to look at RT elbow and Lt ankle. Legs are sore today.   Currently in Pain? Yes   Pain Score 3    Pain Location Pelvis   Pain Orientation Mid   Aggravating Factors  Overdoing her activity   Pain Relieving Factors Modifying activity   Multiple Pain Sites No                         [facility] Adult PT Treatment/Exercise -  02/18/15 0001    Lumbar Exercises: Aerobic   UBE (Upper Arm Bike) Sitting on green ball  L1 5x5   Lumbar Exercises: Standing   Forward Lunge --  Resisted walking 4 ways 20# focus on core, 10 x   Lumbar Exercises: Supine   Other Supine Lumbar Exercises Soft foam roll:  Heel taps 10 bil, hamstring stretch bil, drawbridge stretch                   PT Short Term Goals - 02/11/15 1014    PT SHORT TERM GOAL #1   Title ability to contract abdominal muscles correctly to assist in bowel defecation   Time 4   Period Weeks   Status Achieved   PT SHORT TERM GOAL #2   Title ability to produce a pelvic floor contraction anally with a lift   Time 4   Period Weeks   Status Achieved   PT SHORT TERM GOAL #3   Title independent with scar tissue massage to do at home   Time 4   Period Weeks   Status Achieved   PT SHORT TERM GOAL #4   Title posture awarness to reduce tension in pelvic floor   Time 4   Period Weeks   Status Achieved           PT Long Term Goals - 02/16/15 0941    PT LONG TERM GOAL #1   Title independent with HEP and understand how to progress herself   Time 12   Status On-going  still learning exercise   PT LONG TERM GOAL #2   Title ability to push out a bowel movement with >/= 50% greater ease due to improve pelvic floor strength 4/5   Time 12   Period Weeks   Status On-going  40% easier   PT LONG TERM GOAL #3   Title abdominal strength 4/5 to assist in fully emptying her bladder and bowels   Time 12   Period Weeks   Status On-going  3/5   PT LONG TERM GOAL #4   Title ability to have a bowel movement without urinating due to improved coordination of the pelvic floor muscles   Time 12   Period Weeks   Status On-going  50% of the time   PT LONG TERM GOAL #5   Title ability to wait 2 hours to have a bowel movement or urinating   Time 12   Period Weeks   Status Achieved               Plan -  02/18/15 1002    Clinical Impression Statement  Pt taking a break from her AM walking  due to her ankle sprain. performed core stabilization exercises on foam roll and some hip flexor fascial lengthening.    Pt will benefit from skilled therapeutic intervention in order to improve on the following deficits Decreased activity tolerance;Decreased mobility;Decreased strength;Impaired sensation;Impaired flexibility;Pain;Decreased endurance;Increased fascial restricitons;Decreased coordination   Rehab Potential Good   Clinical Impairments Affecting Rehab Potential None   PT Frequency 2x / week   PT Duration 12 weeks   PT Treatment/Interventions ADLs/Self Care Home Management;Biofeedback;Functional mobility training;Therapeutic activities;Therapeutic exercise;Neuromuscular re-education;Manual techniques;Patient/family education;Scar mobilization;Passive range of motion   PT Next Visit Plan fascial work, core stabs   Consulted and Agree with Plan of Care Patient        Problem List Patient Active Problem List   Diagnosis Date Noted  . History of ileostomy 05/02/2014  . DVT (deep venous thrombosis) 01/14/2014  . Urinary tract infection 01/13/2014  . Large bowel perforation 09/13/2013  . Rectal cancer, 12 - 15 cm from anal verge 05/29/2013  . Herniation of cervical intervertebral disc with radiculopathy 01/03/2012    Jaylie Neaves, PTA 02/18/2015, 10:06 AM  [facility] [address] [place], [place], [address] Phone: [phone]   Fax:  [phone]

## 2015-02-24 ENCOUNTER — Encounter: Payer: Self-pay | Admitting: Physical Therapy

## 2015-02-24 ENCOUNTER — Ambulatory Visit: Payer: 59 | Attending: Obstetrics and Gynecology | Admitting: Physical Therapy

## 2015-02-24 DIAGNOSIS — N8184 Pelvic muscle wasting: Secondary | ICD-10-CM | POA: Diagnosis not present

## 2015-02-24 DIAGNOSIS — M6289 Other specified disorders of muscle: Secondary | ICD-10-CM

## 2015-02-24 NOTE — Therapy (Signed)
[facility] [address], [place], [place], [address] Phone: [phone]   Fax:  [phone]  Physical Therapy Treatment  Patient Details  Name: [name] MRN: [id number] Date of Birth: June 28, 1960 Referring Provider:  Juanda Chance, NP  Encounter Date: 02/24/2015      PT End of Session - 02/24/15 0934    Visit Number 13   Date for PT Re-Evaluation 03/26/15   PT Start Time 0930   PT Stop Time 1012   PT Time Calculation (min) 42 min   Activity Tolerance Patient tolerated treatment well   Behavior During Therapy [facility] for tasks assessed/performed      Past Medical History  Diagnosis Date  . History of radiation therapy 06/18/13-07/26/13    rectal 50.4Gy total dose  . IUD (intrauterine device) in place 08-27-13    Mirena Implant inplace  . Arthritis 08-27-13    at present has ruptured disc- lower back-not an issue now  . PONV (postoperative nausea and vomiting)     scopalamine patch helped  . Hypertension     toxemia with pregnancy 1990  . Anxiety   . UTI (lower urinary tract infection)     hx of   . Blood infection     E Coli after UTI   . DVT (deep venous thrombosis)     left upper extremity currently has stopped xarelto   . Nerve damage     numbness in pelvic area from rectal rection straining to empty bladder   . Stress incontinence   . Colon cancer 08-27-13    radiation /chemo -last ended 4 weeks(Dr. Benay Spice)    Past Surgical History  Procedure Laterality Date  . Back surgery    . Cesarean section      x2  . Cervical disc arthroplasty  01/03/2012    Procedure: CERVICAL ANTERIOR DISC ARTHROPLASTY;  Surgeon: Charlie Pitter, MD;  Location: [facility];  Service: Neurosurgery;  Laterality: N/A;  Cervical Anterior Disc Arthorplasty Five-Six  . Eus N/A 05/31/2013    Procedure: LOWER ENDOSCOPIC ULTRASOUND (EUS);  Surgeon: Beryle Beams, MD;  Location: [facility];  Service: Endoscopy;  Laterality: N/A;   . Intrauterine device insertion  2006    minera  . Knee arthroscopy Left 08-27-13    torn meniscus repair left   . Breast surgery      reduction surgery  . Refractive surgery    . Laparoscopic low anterior resection N/A 09/02/2013    Procedure: LAPAROSCOPIC LOW ANTERIOR RESECTION;  Surgeon: [name], MD;  [facility];  Service: General;  Laterality: N/A;  . Laparotomy N/A 09/13/2013    Procedure: EXPLORATORY LAPAROTOMY / FLEXIBLE SIGMOIDOSCOPY/  DRAINAGE OF PELVIC ABSCESS WITH  DIVERTING  LOOP  ILEOSTOMY;  Surgeon: [name], MD;  [facility];  Service: General;  Laterality: N/A;  . Portacath placement Left 10/11/2013    Procedure: INSERTION PORT-A-CATH;  Surgeon: [name], MD;  [facility];  Service: General;  Laterality: Left;  . Appendectomy    . Bladder study       pressure readings had approx 1.5 months ago   . Ileo loop colostomy closure N/A 05/02/2014    Procedure: LAPAROSCOPIC ASSISTED REVERSAL ILEOSTOMY AND LYSIS OF ADHESIONS;  Surgeon: [name], MD;  [facility];  Service: General;  Laterality: N/A;  . Lysis of adhesion  05/02/2014    Procedure: LYSIS OF ADHESION;  Surgeon: [name], MD;  Location: [facility];  Service: General;;  . Port-a-cath removal Left 06/09/2014    Procedure: REMOVAL PORT-A-CATH  (MINOR PROCEDURE) ;  Surgeon: [name], MD;  Location: [facility];  Service: General;  Laterality: Left;    There were no vitals filed for this visit.  Visit Diagnosis:  PFD (pelvic floor dysfunction)                    Pelvic Floor Special Questions - 02/24/15 0001    Pelvic Floor Internal Exam Patient confirms identification and approves therapist to assess muscle strength and muscle integrity   Exam Type Vaginal   Strength --  3+/5           [facility] Adult PT Treatment/Exercise - 02/24/15 0001    Lumbar Exercises: Aerobic   Elliptical L3 x7 min   Manual Therapy   Manual Therapy Myofascial  release;Soft tissue mobilization;Internal Pelvic Floor   Soft tissue mobilization abdomen to release the large intestines for improved bowel movement   Internal Pelvic Floor vaginally to bilterally to rectal area, bil. sides of urethra, bil. levator ani, bil. puborectalis   Other Manual Therapy scar massage on abdomen                PT Education - 02/24/15 1011    Education provided No          PT Short Term Goals - 02/11/15 1014    PT SHORT TERM GOAL #1   Title ability to contract abdominal muscles correctly to assist in bowel defecation   Time 4   Period Weeks   Status Achieved   PT SHORT TERM GOAL #2   Title ability to produce a pelvic floor contraction anally with a lift   Time 4   Period Weeks   Status Achieved   PT SHORT TERM GOAL #3   Title independent with scar tissue massage to do at home   Time 4   Period Weeks   Status Achieved   PT SHORT TERM GOAL #4   Title posture awarness to reduce tension in pelvic floor   Time 4   Period Weeks   Status Achieved           PT Long Term Goals - 02/24/15 7011    PT LONG TERM GOAL #1   Title independent with HEP and understand how to progress herself   Time 12   Period Weeks   Status On-going  still learning exercises   PT LONG TERM GOAL #2   Title ability to push out a bowel movement with >/= 50% greater ease due to improve pelvic floor strength 4/5   Time 12   Period Weeks   Status On-going  60% better   PT LONG TERM GOAL #3   Title abdominal strength 4/5 to assist in fully emptying her bladder and bowels   Time 12   Period Weeks   Status On-going  not every time   PT LONG TERM GOAL #4   Title ability to have a bowel movement without urinating due to improved coordination of the pelvic floor muscles   Time 12   Period Weeks   Status Achieved               Plan - 02/24/15 1012    Clinical Impression Statement Patient has increased pelvic floor strength vaginally to 3+/5 and some  contractions could be a 4/5.  Patient has decreased feeling on left side of pelvic floor with contraction.  Patient can push out bowel movment with 60% greater ease.  Patient does not fully empty herself every bowel movement. Patient has met LTG #4. Patient would benefit from physical therapy to improve strength and mobility.    Pt will benefit from skilled therapeutic intervention in order to improve on the following deficits Decreased activity tolerance;Decreased mobility;Decreased strength;Impaired sensation;Impaired flexibility;Pain;Decreased endurance;Increased fascial restricitons;Decreased coordination   Rehab Potential Good   Clinical Impairments Affecting Rehab Potential None   PT Frequency 2x / week   PT Treatment/Interventions ADLs/Self Care Home Management;Biofeedback;Functional mobility training;Therapeutic activities;Therapeutic exercise;Neuromuscular re-education;Manual techniques;Patient/family education;Scar mobilization;Passive range of motion   PT Next Visit Plan fascial work, core stabs   PT Home Exercise Plan progress as needed   Consulted and Agree with Plan of Care Patient        Problem List Patient Active Problem List   Diagnosis Date Noted  . History of ileostomy 05/02/2014  . DVT (deep venous thrombosis) 01/14/2014  . Urinary tract infection 01/13/2014  . Large bowel perforation 09/13/2013  . Rectal cancer, 12 - 15 cm from anal verge 05/29/2013  . Herniation of cervical intervertebral disc with radiculopathy 01/03/2012    Gurtej Noyola,PT 02/24/2015, 10:15 AM  [facility] [address] [place], [place], [address] Phone: [phone]   Fax:  [phone]

## 2015-02-25 ENCOUNTER — Encounter: Payer: 59 | Admitting: Physical Therapy

## 2015-02-26 ENCOUNTER — Ambulatory Visit: Payer: 59 | Admitting: Physical Therapy

## 2015-02-26 ENCOUNTER — Encounter: Payer: Self-pay | Admitting: Physical Therapy

## 2015-02-26 DIAGNOSIS — M6289 Other specified disorders of muscle: Secondary | ICD-10-CM

## 2015-02-26 DIAGNOSIS — N8184 Pelvic muscle wasting: Secondary | ICD-10-CM | POA: Diagnosis not present

## 2015-02-26 NOTE — Therapy (Signed)
[facility] [address], [place], [place], [address] Phone: [phone]   Fax:  [phone]  Physical Therapy Treatment  Patient Details  Name: [name] MRN: [id number] Date of Birth: February 23, 1961 Referring Provider:  Juanda Chance, NP  Encounter Date: 02/26/2015      PT End of Session - 02/26/15 1407    Visit Number 14   Date for PT Re-Evaluation 03/26/15   PT Start Time 1400   PT Stop Time 1440   PT Time Calculation (min) 40 min   Activity Tolerance Patient tolerated treatment well   Behavior During Therapy [facility] for tasks assessed/performed      Past Medical History  Diagnosis Date  . History of radiation therapy 06/18/13-07/26/13    rectal 50.4Gy total dose  . IUD (intrauterine device) in place 08-27-13    Mirena Implant inplace  . Arthritis 08-27-13    at present has ruptured disc- lower back-not an issue now  . PONV (postoperative nausea and vomiting)     scopalamine patch helped  . Hypertension     toxemia with pregnancy 1990  . Anxiety   . UTI (lower urinary tract infection)     hx of   . Blood infection     E Coli after UTI   . DVT (deep venous thrombosis)     left upper extremity currently has stopped xarelto   . Nerve damage     numbness in pelvic area from rectal rection straining to empty bladder   . Stress incontinence   . Colon cancer 08-27-13    radiation /chemo -last ended 4 weeks(Dr. Benay Spice)    Past Surgical History  Procedure Laterality Date  . Back surgery    . Cesarean section      x2  . Cervical disc arthroplasty  01/03/2012    Procedure: CERVICAL ANTERIOR DISC ARTHROPLASTY;  Surgeon: Charlie Pitter, MD;  Location: [facility];  Service: Neurosurgery;  Laterality: N/A;  Cervical Anterior Disc Arthorplasty Five-Six  . Eus N/A 05/31/2013    Procedure: LOWER ENDOSCOPIC ULTRASOUND (EUS);  Surgeon: Beryle Beams, MD;  Location: [facility];  Service: Endoscopy;  Laterality: N/A;   . Intrauterine device insertion  2006    minera  . Knee arthroscopy Left 08-27-13    torn meniscus repair left   . Breast surgery      reduction surgery  . Refractive surgery    . Laparoscopic low anterior resection N/A 09/02/2013    Procedure: LAPAROSCOPIC LOW ANTERIOR RESECTION;  Surgeon: [name], MD;  [facility];  Service: General;  Laterality: N/A;  . Laparotomy N/A 09/13/2013    Procedure: EXPLORATORY LAPAROTOMY / FLEXIBLE SIGMOIDOSCOPY/  DRAINAGE OF PELVIC ABSCESS WITH  DIVERTING  LOOP  ILEOSTOMY;  Surgeon: [name], MD;  [facility];  Service: General;  Laterality: N/A;  . Portacath placement Left 10/11/2013    Procedure: INSERTION PORT-A-CATH;  Surgeon: [name], MD;  [facility];  Service: General;  Laterality: Left;  . Appendectomy    . Bladder study       pressure readings had approx 1.5 months ago   . Ileo loop colostomy closure N/A 05/02/2014    Procedure: LAPAROSCOPIC ASSISTED REVERSAL ILEOSTOMY AND LYSIS OF ADHESIONS;  Surgeon: [name], MD;  [facility];  Service: General;  Laterality: N/A;  . Lysis of adhesion  05/02/2014    Procedure: LYSIS OF ADHESION;  Surgeon: [name], MD;  Location: [facility];  Service: General;;  . Port-a-cath removal Left 06/09/2014    Procedure: REMOVAL PORT-A-CATH  (MINOR PROCEDURE) ;  Surgeon: [name], MD;  Location: [facility];  Service: General;  Laterality: Left;    There were no vitals filed for this visit.  Visit Diagnosis:  PFD (pelvic floor dysfunction)                       [facility] Adult PT Treatment/Exercise - 02/26/15 0001    Lumbar Exercises: Aerobic   Elliptical L3 x7 min   Lumbar Exercises: Supine   Bent Knee Raise 20 reps  laying on pink foam roll   Other Supine Lumbar Exercises Soft foam roll:  Heel taps 10 bil, hamstring stretch bil, drawbridge stretch    Other Supine Lumbar Exercises alternate hip and shoulder flexion 20 each on foam roll;  hold one leg up and alternate shoulder flexion; hold both legs up and alterante shoulder flexion to patients level.   Manual Therapy   Manual Therapy Soft tissue mobilization   Soft tissue mobilization abdomen to release the large intestines for improved bowel movement   Other Manual Therapy scar massage on abdomen                PT Education - 02/26/15 1442    Education provided No          PT Short Term Goals - 02/11/15 1014    PT SHORT TERM GOAL #1   Title ability to contract abdominal muscles correctly to assist in bowel defecation   Time 4   Period Weeks   Status Achieved   PT SHORT TERM GOAL #2   Title ability to produce a pelvic floor contraction anally with a lift   Time 4   Period Weeks   Status Achieved   PT SHORT TERM GOAL #3   Title independent with scar tissue massage to do at home   Time 4   Period Weeks   Status Achieved   PT SHORT TERM GOAL #4   Title posture awarness to reduce tension in pelvic floor   Time 4   Period Weeks   Status Achieved           PT Long Term Goals - 02/24/15 5956    PT LONG TERM GOAL #1   Title independent with HEP and understand how to progress herself   Time 12   Period Weeks   Status On-going  still learning exercises   PT LONG TERM GOAL #2   Title ability to push out a bowel movement with >/= 50% greater ease due to improve pelvic floor strength 4/5   Time 12   Period Weeks   Status On-going  60% better   PT LONG TERM GOAL #3   Title abdominal strength 4/5 to assist in fully emptying her bladder and bowels   Time 12   Period Weeks   Status On-going  not every time   PT LONG TERM GOAL #4   Title ability to have a bowel movement without urinating due to improved coordination of the pelvic floor muscles   Time 12   Period Weeks   Status Achieved               Plan - 02/26/15 1442    Clinical Impression Statement Patient was able to do the foam roll exercises with increased trunk control.   Patient abdominal scar and abdominal tissue was very tight today. After  soft tissue work there was increased mobility.  Patient will benefit from physical therapy for core work and manual technique.    Pt will benefit from skilled therapeutic intervention in order to improve on the following deficits Decreased activity tolerance;Decreased mobility;Decreased strength;Impaired sensation;Impaired flexibility;Pain;Decreased endurance;Increased fascial restricitons;Decreased coordination   Rehab Potential Good   Clinical Impairments Affecting Rehab Potential None   PT Frequency 2x / week   PT Duration 12 weeks   PT Treatment/Interventions ADLs/Self Care Home Management;Biofeedback;Functional mobility training;Therapeutic activities;Therapeutic exercise;Neuromuscular re-education;Manual techniques;Patient/family education;Scar mobilization;Passive range of motion   PT Next Visit Plan fascial work, core stabs   PT Home Exercise Plan progress as needed   Consulted and Agree with Plan of Care Patient        Problem List Patient Active Problem List   Diagnosis Date Noted  . History of ileostomy 05/02/2014  . DVT (deep venous thrombosis) 01/14/2014  . Urinary tract infection 01/13/2014  . Large bowel perforation 09/13/2013  . Rectal cancer, 12 - 15 cm from anal verge 05/29/2013  . Herniation of cervical intervertebral disc with radiculopathy 01/03/2012    Azilee Pirro,PT 02/26/2015, 2:44 PM  [facility] [address] [place], [place], [address] Phone: [phone]   Fax:  [phone]

## 2015-02-27 ENCOUNTER — Encounter: Payer: 59 | Admitting: Physical Therapy

## 2015-03-03 ENCOUNTER — Encounter: Payer: Self-pay | Admitting: Physical Therapy

## 2015-03-03 ENCOUNTER — Ambulatory Visit: Payer: 59 | Admitting: Physical Therapy

## 2015-03-03 DIAGNOSIS — M6289 Other specified disorders of muscle: Secondary | ICD-10-CM

## 2015-03-03 DIAGNOSIS — N8184 Pelvic muscle wasting: Secondary | ICD-10-CM | POA: Diagnosis not present

## 2015-03-03 NOTE — Therapy (Signed)
[facility] [address], [place], [place], [address] Phone: [phone]   Fax:  [phone]  Physical Therapy Treatment  Patient Details  Name: [name] MRN: [id number] Date of Birth: 02/08/1961 Referring Provider:  Juanda Chance, NP  Encounter Date: 03/03/2015      PT End of Session - 03/03/15 0941    Visit Number 15   Date for PT Re-Evaluation 03/26/15   PT Start Time 0930   PT Stop Time 1010   PT Time Calculation (min) 40 min   Activity Tolerance Patient tolerated treatment well   Behavior During Therapy [facility] for tasks assessed/performed      Past Medical History  Diagnosis Date  . History of radiation therapy 06/18/13-07/26/13    rectal 50.4Gy total dose  . IUD (intrauterine device) in place 08-27-13    Mirena Implant inplace  . Arthritis 08-27-13    at present has ruptured disc- lower back-not an issue now  . PONV (postoperative nausea and vomiting)     scopalamine patch helped  . Hypertension     toxemia with pregnancy 1990  . Anxiety   . UTI (lower urinary tract infection)     hx of   . Blood infection     E Coli after UTI   . DVT (deep venous thrombosis)     left upper extremity currently has stopped xarelto   . Nerve damage     numbness in pelvic area from rectal rection straining to empty bladder   . Stress incontinence   . Colon cancer 08-27-13    radiation /chemo -last ended 4 weeks(Dr. Benay Spice)    Past Surgical History  Procedure Laterality Date  . Back surgery    . Cesarean section      x2  . Cervical disc arthroplasty  01/03/2012    Procedure: CERVICAL ANTERIOR DISC ARTHROPLASTY;  Surgeon: Charlie Pitter, MD;  Location: [facility];  Service: Neurosurgery;  Laterality: N/A;  Cervical Anterior Disc Arthorplasty Five-Six  . Eus N/A 05/31/2013    Procedure: LOWER ENDOSCOPIC ULTRASOUND (EUS);  Surgeon: Beryle Beams, MD;  Location: [facility];  Service: Endoscopy;  Laterality:  N/A;  . Intrauterine device insertion  2006    minera  . Knee arthroscopy Left 08-27-13    torn meniscus repair left   . Breast surgery      reduction surgery  . Refractive surgery    . Laparoscopic low anterior resection N/A 09/02/2013    Procedure: LAPAROSCOPIC LOW ANTERIOR RESECTION;  Surgeon: [name], MD;  Location: [facility];  Service: General;  Laterality: N/A;  . Laparotomy N/A 09/13/2013    Procedure: EXPLORATORY LAPAROTOMY / FLEXIBLE SIGMOIDOSCOPY/  DRAINAGE OF PELVIC ABSCESS WITH  DIVERTING  LOOP  ILEOSTOMY;  Surgeon: [name], MD;  Location: [facility];  Service: General;  Laterality: N/A;  . Portacath placement Left 10/11/2013    Procedure: INSERTION PORT-A-CATH;  Surgeon: [name], MD;  Location: [facility];  Service: General;  Laterality: Left;  . Appendectomy    . Bladder study       pressure readings had approx 1.5 months ago   . Ileo loop colostomy closure N/A 05/02/2014    Procedure: LAPAROSCOPIC ASSISTED REVERSAL ILEOSTOMY AND LYSIS OF ADHESIONS;  Surgeon: [name], MD;  Location: [facility];  Service: General;  Laterality: N/A;  . Lysis of adhesion  05/02/2014    Procedure: LYSIS OF ADHESION;  Surgeon: [name], MD;  Location: [facility];  Service: General;;  . Port-a-cath removal Left 06/09/2014    Procedure: REMOVAL PORT-A-CATH  (MINOR PROCEDURE) ;  Surgeon: [name], MD;  Location: [facility];  Service: General;  Laterality: Left;    There were no vitals filed for this visit.  Visit Diagnosis:  PFD (pelvic floor dysfunction)      Subjective Assessment - 03/03/15 0943    Subjective I am having more trouble having a bowel movement.  Could be due to interstim not being high enough, or medication.    Patient Stated Goals ability to push a bowel movement out with greater ease, having fewer bowel movement   Currently in Pain? Yes   Pain Score 2    Pain Location Abdomen  rectal   Pain Orientation Mid;Lower   Pain Descriptors / Indicators  Constant   Pain Type Chronic pain   Pain Onset 1 to 4 weeks ago   Pain Frequency Constant   Aggravating Factors  overdoing her activity   Pain Relieving Factors modifying activity                         [facility] Adult PT Treatment/Exercise - 03/03/15 0001    Lumbar Exercises: Aerobic   Elliptical L3 x7 min   Manual Therapy   Manual Therapy Myofascial release;Soft tissue mobilization   Soft tissue mobilization abdomen, diaphragm   Myofascial Release abdomen in sidely   Other Manual Therapy scar massage on abdomen                PT Education - 03/03/15 1011    Education provided No          PT Short Term Goals - 02/11/15 1014    PT SHORT TERM GOAL #1   Title ability to contract abdominal muscles correctly to assist in bowel defecation   Time 4   Period Weeks   Status Achieved   PT SHORT TERM GOAL #2   Title ability to produce a pelvic floor contraction anally with a lift   Time 4   Period Weeks   Status Achieved   PT SHORT TERM GOAL #3   Title independent with scar tissue massage to do at home   Time 4   Period Weeks   Status Achieved   PT SHORT TERM GOAL #4   Title posture awarness to reduce tension in pelvic floor   Time 4   Period Weeks   Status Achieved           PT Long Term Goals - 03/03/15 0941    PT LONG TERM GOAL #1   Title independent with HEP and understand how to progress herself   Time 12   Period Weeks   Status On-going  still learning exercises   PT LONG TERM GOAL #2   Title ability to push out a bowel movement with >/= 50% greater ease due to improve pelvic floor strength 4/5   Time 12   Period Weeks   Status On-going  more trouble this week   PT LONG TERM GOAL #3   Title abdominal strength 4/5 to assist in fully emptying her bladder and bowels   Time 12   Period Weeks   Status On-going  more difficulty to push bowels out   PT LONG TERM GOAL #4   Title ability to have a bowel movement without urinating due  to improved coordination of the pelvic floor muscles   Time 12  Period Weeks   Status Achieved   PT LONG TERM GOAL #5   Title ability to wait 2 hours to have a bowel movement or urinating   Time 12   Period Weeks   Status Achieved               Plan - 03/03/15 1011    Clinical Impression Statement Patient is a 54 year old female with rectal cancer.  Patient hs increased tightness in abdomen making it difficult to have a bowel movement.  Patient had tightness in scar, abdomen and diaphragm.  After therapy abdomen decreased in size by 2 inches.  Patient wil lbenefit from physical hterapy for core work and manual technique.    Pt will benefit from skilled therapeutic intervention in order to improve on the following deficits Decreased activity tolerance;Decreased mobility;Decreased strength;Impaired sensation;Impaired flexibility;Pain;Decreased endurance;Increased fascial restricitons;Decreased coordination   Rehab Potential Good   Clinical Impairments Affecting Rehab Potential None   PT Frequency 2x / week   PT Duration 12 weeks   PT Treatment/Interventions ADLs/Self Care Home Management;Biofeedback;Functional mobility training;Therapeutic activities;Therapeutic exercise;Neuromuscular re-education;Manual techniques;Patient/family education;Scar mobilization;Passive range of motion   PT Next Visit Plan fascial work, core stabs   PT Home Exercise Plan progress as needed   Consulted and Agree with Plan of Care Patient        Problem List Patient Active Problem List   Diagnosis Date Noted  . History of ileostomy 05/02/2014  . DVT (deep venous thrombosis) 01/14/2014  . Urinary tract infection 01/13/2014  . Large bowel perforation 09/13/2013  . Rectal cancer, 12 - 15 cm from anal verge 05/29/2013  . Herniation of cervical intervertebral disc with radiculopathy 01/03/2012    Latonia Conrow,PT 03/03/2015, 10:14 AM  [facility] [address] [place], [place], [address] Phone: [phone]   Fax:  [phone]

## 2015-03-04 ENCOUNTER — Encounter: Payer: Self-pay | Admitting: Physical Therapy

## 2015-03-04 ENCOUNTER — Ambulatory Visit: Payer: 59 | Admitting: Physical Therapy

## 2015-03-04 DIAGNOSIS — N8184 Pelvic muscle wasting: Secondary | ICD-10-CM | POA: Diagnosis not present

## 2015-03-04 DIAGNOSIS — M6289 Other specified disorders of muscle: Secondary | ICD-10-CM

## 2015-03-04 NOTE — Patient Instructions (Addendum)
Prone Exercises:  1. Laying on stomach: inhale, exhale out mouth as you pull belly button up, smile, connect ribs gently: hold 1-2 sec. Do 10x before proceeding.  .Hip Extension: 2-4 Inches  Set your abdominals first, THEN lift leg.  Lift one leg _5__ times. Restabilize pelvis. Repeat with other leg. Keep pelvis still. Be sure pelvis does not rotate and back does not arch. Do _1__ sets, __1_ times per day or  3x week to start.   Then add opposite arm and leg lift, be sure to lengthen and keep core contracted. Only do 3-5 each side, keep reps low.   http://ss.exer.us/63   Copyright  VHI. All rights reserved.

## 2015-03-04 NOTE — Therapy (Signed)
[facility] [address] [place], [place], [address] Phone: [phone]   Fax:  [phone]  Physical Therapy Treatment  Patient Details  Name: [name] MRN: [id number] Date of Birth: October 18, 1960 Referring Provider:  Juanda Chance, NP  Encounter Date: 03/04/2015      PT End of Session - 03/04/15 1059    Visit Number 16   Date for PT Re-Evaluation 03/26/15   PT Start Time 1027   PT Stop Time 1100   PT Time Calculation (min) 33 min   Activity Tolerance Patient tolerated treatment well   Behavior During Therapy [facility] for tasks assessed/performed      Past Medical History  Diagnosis Date  . History of radiation therapy 06/18/13-07/26/13    rectal 50.4Gy total dose  . IUD (intrauterine device) in place 08-27-13    Mirena Implant inplace  . Arthritis 08-27-13    at present has ruptured disc- lower back-not an issue now  . PONV (postoperative nausea and vomiting)     scopalamine patch helped  . Hypertension     toxemia with pregnancy 1990  . Anxiety   . UTI (lower urinary tract infection)     hx of   . Blood infection     E Coli after UTI   . DVT (deep venous thrombosis)     left upper extremity currently has stopped xarelto   . Nerve damage     numbness in pelvic area from rectal rection straining to empty bladder   . Stress incontinence   . Colon cancer 08-27-13    radiation /chemo -last ended 4 weeks(Dr. Benay Spice)    Past Surgical History  Procedure Laterality Date  . Back surgery    . Cesarean section      x2  . Cervical disc arthroplasty  01/03/2012    Procedure: CERVICAL ANTERIOR DISC ARTHROPLASTY;  Surgeon: Charlie Pitter, MD;  Location: [facility];  Service: Neurosurgery;  Laterality: N/A;  Cervical Anterior Disc Arthorplasty Five-Six  . Eus N/A 05/31/2013    Procedure: LOWER ENDOSCOPIC ULTRASOUND (EUS);  Surgeon: Beryle Beams, MD;  Location: [facility];  Service: Endoscopy;  Laterality:  N/A;  . Intrauterine device insertion  2006    minera  . Knee arthroscopy Left 08-27-13    torn meniscus repair left   . Breast surgery      reduction surgery  . Refractive surgery    . Laparoscopic low anterior resection N/A 09/02/2013    Procedure: LAPAROSCOPIC LOW ANTERIOR RESECTION;  Surgeon: [name], MD;  [facility];  Service: General;  Laterality: N/A;  . Laparotomy N/A 09/13/2013    Procedure: EXPLORATORY LAPAROTOMY / FLEXIBLE SIGMOIDOSCOPY/  DRAINAGE OF PELVIC ABSCESS WITH  DIVERTING  LOOP  ILEOSTOMY;  Surgeon: [name], MD;  Location: [facility];  Service: General;  Laterality: N/A;  . Portacath placement Left 10/11/2013    Procedure: INSERTION PORT-A-CATH;  Surgeon: [name], MD;  Location[facility];  Service: General;  Laterality: Left;  . Appendectomy    . Bladder study       pressure readings had approx 1.5 months ago   . Ileo loop colostomy closure N/A 05/02/2014    Procedure: LAPAROSCOPIC ASSISTED REVERSAL ILEOSTOMY AND LYSIS OF ADHESIONS;  Surgeon: [name], MD;  [facility];  Service: General;  Laterality: N/A;  . Lysis of adhesion  05/02/2014    Procedure: LYSIS OF ADHESION;  Surgeon: [name], MD;  Location: [facility];  Service: General;;  . Port-a-cath removal Left 06/09/2014    Procedure: REMOVAL PORT-A-CATH  (MINOR PROCEDURE) ;  Surgeon: [name], MD;  Location: [facility];  Service: General;  Laterality: Left;    There were no vitals filed for this visit.  Visit Diagnosis:  PFD (pelvic floor dysfunction)      Subjective Assessment - 03/04/15 1029    Subjective Changing her setting on her machine seemed to help with her bowel congestion.   Pain Score 4    Pain Location Back   Pain Orientation Lower   Multiple Pain Sites No                         [facility] Adult PT Treatment/Exercise - 03/04/15 0001    Lumbar Exercises: Prone   Straight Leg Raise 5 reps;1 second   Opposite Arm/Leg Raise Right  arm/Left leg;Left arm/Right leg;5 reps;1 second   Other Prone Lumbar Exercises TA contraction 10x                PT Education - 03/04/15 1034    Education Details Prone HEP for TA and core   Person(s) Educated Patient   Methods Explanation;Demonstration;Tactile cues;Verbal cues;Handout   Comprehension Verbalized understanding;Returned demonstration          PT Short Term Goals - 02/11/15 1014    PT SHORT TERM GOAL #1   Title ability to contract abdominal muscles correctly to assist in bowel defecation   Time 4   Period Weeks   Status Achieved   PT SHORT TERM GOAL #2   Title ability to produce a pelvic floor contraction anally with a lift   Time 4   Period Weeks   Status Achieved   PT SHORT TERM GOAL #3   Title independent with scar tissue massage to do at home   Time 4   Period Weeks   Status Achieved   PT SHORT TERM GOAL #4   Title posture awarness to reduce tension in pelvic floor   Time 4   Period Weeks   Status Achieved           PT Long Term Goals - 03/03/15 0941    PT LONG TERM GOAL #1   Title independent with HEP and understand how to progress herself   Time 12   Period Weeks   Status On-going  still learning exercises   PT LONG TERM GOAL #2   Title ability to push out a bowel movement with >/= 50% greater ease due to improve pelvic floor strength 4/5   Time 12   Period Weeks   Status On-going  more trouble this week   PT LONG TERM GOAL #3   Title abdominal strength 4/5 to assist in fully emptying her bladder and bowels   Time 12   Period Weeks   Status On-going  more difficulty to push bowels out   PT LONG TERM GOAL #4   Title ability to have a bowel movement without urinating due to improved coordination of the pelvic floor muscles   Time 12   Period Weeks   Status Achieved   PT LONG TERM GOAL #5   Title ability to wait 2 hours to have a bowel movement or urinating   Time 12   Period Weeks   Status Achieved                Plan - 03/04/15 1059    Clinical  Impression Statement Pt 15 min late today, got times mixed up. Weadvanced core exercises to include prone exercises which proved to be challengin to pt.    Pt will benefit from skilled therapeutic intervention in order to improve on the following deficits Decreased activity tolerance;Decreased mobility;Decreased strength;Impaired sensation;Impaired flexibility;Pain;Decreased endurance;Increased fascial restricitons;Decreased coordination   Rehab Potential Good   Clinical Impairments Affecting Rehab Potential None   PT Frequency 2x / week   PT Duration 12 weeks   PT Treatment/Interventions ADLs/Self Care Home Management;Biofeedback;Functional mobility training;Therapeutic activities;Therapeutic exercise;Neuromuscular re-education;Manual techniques;Patient/family education;Scar mobilization;Passive range of motion   PT Next Visit Plan fascial work, core stabs, review prone exercises.   Consulted and Agree with Plan of Care Patient        Problem List Patient Active Problem List   Diagnosis Date Noted  . History of ileostomy 05/02/2014  . DVT (deep venous thrombosis) 01/14/2014  . Urinary tract infection 01/13/2014  . Large bowel perforation 09/13/2013  . Rectal cancer, 12 - 15 cm from anal verge 05/29/2013  . Herniation of cervical intervertebral disc with radiculopathy 01/03/2012    Roann Merk, PTA 03/04/2015, 11:01 AM  [facility] [address], [place], [place], [address] Phone: [phone]   Fax:  [phone]

## 2015-03-09 ENCOUNTER — Ambulatory Visit: Payer: 59 | Admitting: Physical Therapy

## 2015-03-09 ENCOUNTER — Encounter: Payer: Self-pay | Admitting: Physical Therapy

## 2015-03-09 DIAGNOSIS — M6289 Other specified disorders of muscle: Secondary | ICD-10-CM

## 2015-03-09 DIAGNOSIS — N8184 Pelvic muscle wasting: Secondary | ICD-10-CM | POA: Diagnosis not present

## 2015-03-09 NOTE — Patient Instructions (Signed)
Isometric Hold (Quadruped)   On hands and knees, slowly inhale, and then exhale. Pull navel toward spine and Hold for _5__ seconds. Continue to breathe in and out during hold. Rest for _5__ seconds. Repeat _5_ times. Do _1-2__ times a day.   Copyright  VHI. All rights reserved.  Bracing With Arm Raise (Quadruped)   On hands and knees find neutral spine. Tighten pelvic floor and abdominals and hold. Alternately lift arm to shoulder level. Repeat _3-5__ times. Do _1-2__ times a day.   Copyright  VHI. All rights reserved.  Bracing With Leg Raise (Quadruped)   On hands and knees find neutral spine. Tighten pelvic floor and abdominals and hold. Alternating legs, straighten and lift to hip level. Repeat _3-5__ times. Do _1-2__ times a day.   Copyright  VHI. All rights reserved.  Bracing With Arm / Leg Raise (Quadruped)   On hands and knees find neutral spine. Tighten pelvic floor and abdominals and hold. Alternating, lift arm to shoulder level and opposite leg to hip level. Repeat _3-5__ times. Do 1-2___ times a day.   Copyright  VHI. All rights reserved.

## 2015-03-09 NOTE — Therapy (Signed)
[facility] [address], [place], [place], [address] Phone: [phone]   Fax:  [phone]  Physical Therapy Treatment  Patient Details  Name: [name] MRN: [id number] Date of Birth: [birth year] Referring Provider:  Juanda Chance, NP  Encounter Date: 03/09/2015      PT End of Session - 03/09/15 1213    Visit Number 17   Date for PT Re-Evaluation 03/26/15   PT Start Time 1148   PT Stop Time 1218   PT Time Calculation (min) 30 min   Activity Tolerance Patient tolerated treatment well   Behavior During Therapy [facility] for tasks assessed/performed      Past Medical History  Diagnosis Date  . History of radiation therapy 06/18/13-07/26/13    rectal 50.4Gy total dose  . IUD (intrauterine device) in place 08-27-13    Mirena Implant inplace  . Arthritis 08-27-13    at present has ruptured disc- lower back-not an issue now  . PONV (postoperative nausea and vomiting)     scopalamine patch helped  . Hypertension     toxemia with pregnancy 1990  . Anxiety   . UTI (lower urinary tract infection)     hx of   . Blood infection     E Coli after UTI   . DVT (deep venous thrombosis)     left upper extremity currently has stopped xarelto   . Nerve damage     numbness in pelvic area from rectal rection straining to empty bladder   . Stress incontinence   . Colon cancer 08-27-13    radiation /chemo -last ended 4 weeks(Dr. Benay Spice)    Past Surgical History  Procedure Laterality Date  . Back surgery    . Cesarean section      x2  . Cervical disc arthroplasty  01/03/2012    Procedure: CERVICAL ANTERIOR DISC ARTHROPLASTY;  Surgeon: Charlie Pitter, MD;  Location: [place] [facility];  Service: Neurosurgery;  Laterality: N/A;  Cervical Anterior Disc Arthorplasty Five-Six  . Eus N/A 05/31/2013    Procedure: LOWER ENDOSCOPIC ULTRASOUND (EUS);  Surgeon: Beryle Beams, MD;  Location: [facility];  Service: Endoscopy;  Laterality:  N/A;  . Intrauterine device insertion  2006    minera  . Knee arthroscopy Left 08-27-13    torn meniscus repair left   . Breast surgery      reduction surgery  . Refractive surgery    . Laparoscopic low anterior resection N/A 09/02/2013    Procedure: LAPAROSCOPIC LOW ANTERIOR RESECTION;  Surgeon: [name], MD;  [facility]: [facility];  Service: General;  Laterality: N/A;  . Laparotomy N/A 09/13/2013    Procedure: EXPLORATORY LAPAROTOMY / FLEXIBLE SIGMOIDOSCOPY/  DRAINAGE OF PELVIC ABSCESS WITH  DIVERTING  LOOP  ILEOSTOMY;  Surgeon: [name], MD;  [facility];  Service: General;  Laterality: N/A;  . Portacath placement Left 10/11/2013    Procedure: INSERTION PORT-A-CATH;  Surgeon: [name], MD;  [facility];  Service: General;  Laterality: Left;  . Appendectomy    . Bladder study       pressure readings had approx 1.5 months ago   . Ileo loop colostomy closure N/A 05/02/2014    Procedure: LAPAROSCOPIC ASSISTED REVERSAL ILEOSTOMY AND LYSIS OF ADHESIONS;  Surgeon: [name], MD;  [facility];  Service: General;  Laterality: N/A;  . Lysis of adhesion  05/02/2014    Procedure: LYSIS OF ADHESION;  Surgeon: [name], MD;  Location: [facility];  Service: General;;  . Port-a-cath removal Left 06/09/2014    Procedure: REMOVAL PORT-A-CATH  (MINOR PROCEDURE) ;  Surgeon: [name], MD;  Location: [place];  Service: General;  Laterality: Left;    There were no vitals filed for this visit.  Visit Diagnosis:  PFD (pelvic floor dysfunction)      Subjective Assessment - 03/09/15 1151    Subjective No new complaints. Doing new exercises without increasing pain in back.    Currently in Pain? Yes   Pain Score 3    Pain Location Arm   Pain Orientation Right   Pain Descriptors / Indicators Constant   Aggravating Factors  Overdoing it   Pain Relieving Factors Modifying activity                         [facility] Adult PT Treatment/Exercise  - 03/09/15 0001    Lumbar Exercises: Aerobic   UBE (Upper Arm Bike) sitting on green ball L2 4x4   Lumbar Exercises: Prone   Straight Leg Raise 5 reps;1 second   Opposite Arm/Leg Raise Right arm/Left leg;Left arm/Right leg;5 reps;2 seconds   Lumbar Exercises: Quadruped   Single Arm Raise Right;Left;2 seconds   Straight Leg Raise 3 seconds  3 x bil   Opposite Arm/Leg Raise --  3 x bil 3 sec hold                PT Education - 03/09/15 1208    Education provided Yes   Education Details quadruped for HEP prone ex for HEP   Person(s) Educated Patient   Methods Explanation;Demonstration;Tactile cues;Verbal cues;Handout   Comprehension Verbalized understanding;Returned demonstration          PT Short Term Goals - 02/11/15 1014    PT SHORT TERM GOAL #1   Title ability to contract abdominal muscles correctly to assist in bowel defecation   Time 4   Period Weeks   Status Achieved   PT SHORT TERM GOAL #2   Title ability to produce a pelvic floor contraction anally with a lift   Time 4   Period Weeks   Status Achieved   PT SHORT TERM GOAL #3   Title independent with scar tissue massage to do at home   Time 4   Period Weeks   Status Achieved   PT SHORT TERM GOAL #4   Title posture awarness to reduce tension in pelvic floor   Time 4   Period Weeks   Status Achieved           PT Long Term Goals - 03/09/15 1201    PT LONG TERM GOAL #1   Title independent with HEP and understand how to progress herself   Period Weeks   Status On-going   PT LONG TERM GOAL #2   Title ability to push out a bowel movement with >/= 50% greater ease due to improve pelvic floor strength 4/5   Time 12   Period Weeks   Status On-going   PT LONG TERM GOAL #3   Title abdominal strength 4/5 to assist in fully emptying her bladder and bowels   Time 12   Period Weeks   Status On-going   PT LONG TERM GOAL #4   Title ability to have a bowel movement without urinating due to improved  coordination of the pelvic floor muscles   Time 12   Period Weeks   Status Achieved  Plan - 03/09/15 1214    Clinical Impression Statement pt doing well with more advanced core exercises. She has not expereinced any increased back pain with them. She is going to try and get an order for therapy to treat her Rt elbow as this pain has not changed.    Pt will benefit from skilled therapeutic intervention in order to improve on the following deficits Decreased activity tolerance;Decreased mobility;Decreased strength;Impaired sensation;Impaired flexibility;Pain;Decreased endurance;Increased fascial restricitons;Decreased coordination   Rehab Potential Good   Clinical Impairments Affecting Rehab Potential None   PT Duration 12 weeks   PT Treatment/Interventions ADLs/Self Care Home Management;Biofeedback;Functional mobility training;Therapeutic activities;Therapeutic exercise;Neuromuscular re-education;Manual techniques;Patient/family education;Scar mobilization;Passive range of motion   PT Next Visit Plan Core stabs, fascial work   Consulted and Agree with Plan of Care Patient        Problem List Patient Active Problem List   Diagnosis Date Noted  . History of ileostomy 05/02/2014  . DVT (deep venous thrombosis) 01/14/2014  . Urinary tract infection 01/13/2014  . Large bowel perforation 09/13/2013  . Rectal cancer, 12 - 15 cm from anal verge 05/29/2013  . Herniation of cervical intervertebral disc with radiculopathy 01/03/2012    COCHRAN,JENNIFER, PTA 03/09/2015, 12:20 PM  [facility] [address] [place], [place], [address] Phone: [phone]   Fax:  [phone]

## 2015-03-11 ENCOUNTER — Ambulatory Visit: Payer: 59 | Admitting: Physical Therapy

## 2015-03-11 ENCOUNTER — Encounter: Payer: Self-pay | Admitting: Physical Therapy

## 2015-03-11 DIAGNOSIS — M6289 Other specified disorders of muscle: Secondary | ICD-10-CM

## 2015-03-11 DIAGNOSIS — N8184 Pelvic muscle wasting: Secondary | ICD-10-CM | POA: Diagnosis not present

## 2015-03-11 NOTE — Therapy (Signed)
[facility] [address], [place], [place], [address] Phone: [phone]   Fax:  [phone]  Physical Therapy Treatment  Patient Details  Name: [name] MRN: [id number] Date of Birth: Jun 11, 1961 Referring Provider:  Juanda Chance, NP  Encounter Date: 03/11/2015      PT End of Session - 03/11/15 1143    Visit Number 18   Date for PT Re-Evaluation 03/26/15   PT Start Time 1100   PT Stop Time 1143   PT Time Calculation (min) 43 min   Activity Tolerance Patient tolerated treatment well   Behavior During Therapy [facility] for tasks assessed/performed      Past Medical History  Diagnosis Date  . History of radiation therapy 06/18/13-07/26/13    rectal 50.4Gy total dose  . IUD (intrauterine device) in place 08-27-13    Mirena Implant inplace  . Arthritis 08-27-13    at present has ruptured disc- lower back-not an issue now  . PONV (postoperative nausea and vomiting)     scopalamine patch helped  . Hypertension     toxemia with pregnancy 1990  . Anxiety   . UTI (lower urinary tract infection)     hx of   . Blood infection     E Coli after UTI   . DVT (deep venous thrombosis)     left upper extremity currently has stopped xarelto   . Nerve damage     numbness in pelvic area from rectal rection straining to empty bladder   . Stress incontinence   . Colon cancer 08-27-13    radiation /chemo -last ended 4 weeks(Dr. Benay Spice)    Past Surgical History  Procedure Laterality Date  . Back surgery    . Cesarean section      x2  . Cervical disc arthroplasty  01/03/2012    Procedure: CERVICAL ANTERIOR DISC ARTHROPLASTY;  Surgeon: Charlie Pitter, MD;  Location: [place] [facility];  Service: Neurosurgery;  Laterality: N/A;  Cervical Anterior Disc Arthorplasty Five-Six  . Eus N/A 05/31/2013    Procedure: LOWER ENDOSCOPIC ULTRASOUND (EUS);  Surgeon: Beryle Beams, MD;  Location: [facility];  Service: Endoscopy;  Laterality:  N/A;  . Intrauterine device insertion  2006    [facility]era  . Knee arthroscopy Left 08-27-13    torn meniscus repair left   . Breast surgery      reduction surgery  . Refractive surgery    . Laparoscopic low anterior resection N/A 09/02/2013    Procedure: LAPAROSCOPIC LOW ANTERIOR RESECTION;  Surgeon: [name], MD;  [facility];  Service: General;  Laterality: N/A;  . Laparotomy N/A 09/13/2013    Procedure: EXPLORATORY LAPAROTOMY / FLEXIBLE SIGMOIDOSCOPY/  DRAINAGE OF PELVIC ABSCESS WITH  DIVERTING  LOOP  ILEOSTOMY;  Surgeon: [name], MD;  [facility];  Service: General;  Laterality: N/A;  . Portacath placement Left 10/11/2013    Procedure: INSERTION PORT-A-CATH;  Surgeon: [name], MD;  [facility];  Service: General;  Laterality: Left;  . Appendectomy    . Bladder study       pressure readings had approx 1.5 months ago   . Ileo loop colostomy closure N/A 05/02/2014    Procedure: LAPAROSCOPIC ASSISTED REVERSAL ILEOSTOMY AND LYSIS OF ADHESIONS;  Surgeon: [name], MD;  [facility];  Service: General;  Laterality: N/A;  . Lysis of adhesion  05/02/2014    Procedure: LYSIS OF ADHESION;  Surgeon: [name], MD;  Location: [facility];  Service: General;;  . Port-a-cath removal Left 06/09/2014    Procedure: REMOVAL PORT-A-CATH  (MINOR PROCEDURE) ;  Surgeon: [name], MD;  Location: [place];  Service: General;  Laterality: Left;    There were no vitals filed for this visit.  Visit Diagnosis:  PFD (pelvic floor dysfunction)      Subjective Assessment - 03/11/15 1110    Subjective I can have a bowel movement  but when I strain I will urinate.  I changed my setting to 2 on stimulator.    Patient Stated Goals ability to push a bowel movement out with greater ease, having fewer bowel movement                      Pelvic Floor Special Questions - 03/11/15 0001    Pelvic Floor Internal Exam Patient confirms identification  and approves therapist to assess muscle strength and muscle integrity   Exam Type Vaginal           [facility] Adult PT Treatment/Exercise - 03/11/15 0001    Lumbar Exercises: Aerobic   Elliptical L3 x7 min   Modalities   Modalities Moist Heat   Moist Heat Therapy   Number Minutes Moist Heat 20 Minutes   Moist Heat Location Lumbar Spine   Manual Therapy   Manual Therapy Soft tissue mobilization   Soft tissue mobilization abdomen, diaphragm   Internal Pelvic Floor bil. obturaotr internis, and levaotr ani and bil. sides of rectum                PT Education - 03/11/15 1143    Education provided No          PT Short Term Goals - 02/11/15 1014    PT SHORT TERM GOAL #1   Title ability to contract abdominal muscles correctly to assist in bowel defecation   Time 4   Period Weeks   Status Achieved   PT SHORT TERM GOAL #2   Title ability to produce a pelvic floor contraction anally with a lift   Time 4   Period Weeks   Status Achieved   PT SHORT TERM GOAL #3   Title independent with scar tissue massage to do at home   Time 4   Period Weeks   Status Achieved   PT SHORT TERM GOAL #4   Title posture awarness to reduce tension in pelvic floor   Time 4   Period Weeks   Status Achieved           PT Long Term Goals - 03/11/15 1111    PT LONG TERM GOAL #1   Title independent with HEP and understand how to progress herself   Time 12   Period Weeks   Status On-going   PT LONG TERM GOAL #2   Title ability to push out a bowel movement with >/= 50% greater ease due to improve pelvic floor strength 4/5   Time 12   Period Weeks   Status Achieved   PT LONG TERM GOAL #3   Title abdominal strength 4/5 to assist in fully emptying her bladder and bowels   Time 12   Period Weeks   Status On-going   PT LONG TERM GOAL #4   Title ability to have a bowel movement without urinating due to improved coordination of the pelvic floor muscles   Time 12   Period Weeks   Status  Achieved   PT LONG TERM GOAL #5  Title ability to wait 2 hours to have a bowel movement or urinating   Time 12   Period Weeks   Status Achieved               Plan - 03/11/15 1143    Clinical Impression Statement Patient has increase tissue mobility of the pelvic floor muscles.  Paitent is having more back pain and right elbow pain. Patient is getting ready for discharge in the next few visitis.    Pt will benefit from skilled therapeutic intervention in order to improve on the following deficits Decreased activity tolerance;Decreased mobility;Decreased strength;Impaired sensation;Impaired flexibility;Pain;Decreased endurance;Increased fascial restricitons;Decreased coordination   Clinical Impairments Affecting Rehab Potential None   PT Frequency 2x / week   PT Duration 12 weeks   PT Treatment/Interventions ADLs/Self Care Home Management;Biofeedback;Functional mobility training;Therapeutic activities;Therapeutic exercise;Neuromuscular re-education;Manual techniques;Patient/family education;Scar mobilization;Passive range of motion   PT Next Visit Plan Core stabs, fascial work   PT Home Exercise Plan progress as needed   Consulted and Agree with Plan of Care Patient        Problem List Patient Active Problem List   Diagnosis Date Noted  . History of ileostomy 05/02/2014  . DVT (deep venous thrombosis) 01/14/2014  . Urinary tract infection 01/13/2014  . Large bowel perforation 09/13/2013  . Rectal cancer, 12 - 15 cm from anal verge 05/29/2013  . Herniation of cervical intervertebral disc with radiculopathy 01/03/2012    GRAY,CHERYL,PT 03/11/2015, 11:46 AM  [facility] [address] [place], [place], [address] Phone: [phone]   Fax:  [phone]

## 2015-03-16 ENCOUNTER — Ambulatory Visit: Payer: 59 | Admitting: Physical Therapy

## 2015-03-16 DIAGNOSIS — M6289 Other specified disorders of muscle: Secondary | ICD-10-CM

## 2015-03-16 DIAGNOSIS — N8184 Pelvic muscle wasting: Secondary | ICD-10-CM | POA: Diagnosis not present

## 2015-03-16 NOTE — Therapy (Signed)
[facility] [address], [place], [place], [address] Phone: [phone]   Fax:  [phone]  Physical Therapy Treatment  Patient Details  Name: [name] MRN: [id number] Date of Birth: 1960-12-21 Referring Provider:  Juanda Chance, NP  Encounter Date: 03/16/2015      PT End of Session - 03/16/15 1026    Visit Number 19   Date for PT Re-Evaluation 03/26/15   PT Start Time 9678   PT Stop Time 1055   PT Time Calculation (min) 40 min   Activity Tolerance Patient tolerated treatment well   Behavior During Therapy [facility] for tasks assessed/performed      Past Medical History  Diagnosis Date  . History of radiation therapy 06/18/13-07/26/13    rectal 50.4Gy total dose  . IUD (intrauterine device) in place 08-27-13    Mirena Implant inplace  . Arthritis 08-27-13    at present has ruptured disc- lower back-not an issue now  . PONV (postoperative nausea and vomiting)     scopalamine patch helped  . Hypertension     toxemia with pregnancy 1990  . Anxiety   . UTI (lower urinary tract infection)     hx of   . Blood infection     E Coli after UTI   . DVT (deep venous thrombosis)     left upper extremity currently has stopped xarelto   . Nerve damage     numbness in pelvic area from rectal rection straining to empty bladder   . Stress incontinence   . Colon cancer 08-27-13    radiation /chemo -last ended 4 weeks(Dr. Benay Spice)    Past Surgical History  Procedure Laterality Date  . Back surgery    . Cesarean section      x2  . Cervical disc arthroplasty  01/03/2012    Procedure: CERVICAL ANTERIOR DISC ARTHROPLASTY;  Surgeon: Charlie Pitter, MD;  Location: [place] [facility];  Service: Neurosurgery;  Laterality: N/A;  Cervical Anterior Disc Arthorplasty Five-Six  . Eus N/A 05/31/2013    Procedure: LOWER ENDOSCOPIC ULTRASOUND (EUS);  Surgeon: Beryle Beams, MD;  Location: [facility];  Service: Endoscopy;  Laterality:  N/A;  . Intrauterine device insertion  2006    minera  . Knee arthroscopy Left 08-27-13    torn meniscus repair left   . Breast surgery      reduction surgery  . Refractive surgery    . Laparoscopic low anterior resection N/A 09/02/2013    Procedure: LAPAROSCOPIC LOW ANTERIOR RESECTION;  Surgeon: [name], MD;  [facility];  Service: General;  Laterality: N/A;  . Laparotomy N/A 09/13/2013    Procedure: EXPLORATORY LAPAROTOMY / FLEXIBLE SIGMOIDOSCOPY/  DRAINAGE OF PELVIC ABSCESS WITH  DIVERTING  LOOP  ILEOSTOMY;  Surgeon: [name], MD;  [facility];  Service: General;  Laterality: N/A;  . Portacath placement Left 10/11/2013    Procedure: INSERTION PORT-A-CATH;  Surgeon: [name], MD;  [facility];  Service: General;  Laterality: Left;  . Appendectomy    . Bladder study       pressure readings had approx 1.5 months ago   . Ileo loop colostomy closure N/A 05/02/2014    Procedure: LAPAROSCOPIC ASSISTED REVERSAL ILEOSTOMY AND LYSIS OF ADHESIONS;  Surgeon: [name], MD;  [facility];  Service: General;  Laterality: N/A;  . Lysis of adhesion  05/02/2014    Procedure: LYSIS OF ADHESION;  Surgeon: [name], MD;  Location: [facility];  Service: General;;  . Port-a-cath removal Left 06/09/2014    Procedure: REMOVAL PORT-A-CATH  (MINOR PROCEDURE) ;  Surgeon: [name], MD;  Location: [place];  Service: General;  Laterality: Left;    There were no vitals filed for this visit.  Visit Diagnosis:  PFD (pelvic floor dysfunction)      Subjective Assessment - 03/16/15 1031    Subjective I can fully empty my bowels. going to the bathroom is 75% better.  Goes to the bathroom 8 times instead of 15 times.    Patient Stated Goals ability to push a bowel movement out with greater ease, having fewer bowel movement   Currently in Pain? Yes   Pain Score 3    Pain Location Arm   Pain Orientation Right   Pain Descriptors / Indicators Constant   Pain  Type Chronic pain   Pain Onset 1 to 4 weeks ago   Pain Frequency Constant   Aggravating Factors  overdoing it   Pain Relieving Factors modifying activity   Multiple Pain Sites No            [facility] PT Assessment - 03/16/15 0001    Assessment   Medical Diagnosis Pelvic floor dysfunction; pudendal nerve injury   Onset Date/Surgical Date 05/03/15   Prior Therapy yes   Precautions   Precautions Other (comment)  no ultrasound, no electrical stimulation   Balance Screen   Has the patient fallen in the past 6 months No   Has the patient had a decrease in activity level because of a fear of falling?  No   Prior Function   Level of Independence Independent   Observation/Other Assessments   Focus on Therapeutic Outcomes (FOTO)  42% due to right elbow pain.  pelvic floor is 75% better   Strength   Right Hip Extension 5/5   Right Hip ABduction 4+/5   Right Hip ADduction 4+/5   Left Hip Extension 5/5   Left Hip ABduction 5/5   Left Hip ADduction 5/5   Palpation   SI assessment  pelvis in correct alignment                  Pelvic Floor Special Questions - 03/16/15 0001    Pelvic Floor Internal Exam Patient confirms identification and approves therapist to assess muscle strength and muscle integrity   Exam Type Vaginal   Strength fair squeeze, definite lift           [facility] Adult PT Treatment/Exercise - 03/16/15 0001    Manual Therapy   Manual Therapy Soft tissue mobilization;Internal Pelvic Floor   Soft tissue mobilization abdomen, diaphragm   Internal Pelvic Floor bil. obturaotr internis, and levaotr ani and bil. sides of rectum                PT Education - 03/16/15 1056    Education provided No   Education Details reviewed  HEP and patient able to perform correctly   Person(s) Educated Patient   Methods Explanation   Comprehension Verbalized understanding          PT Short Term Goals - 02/11/15 1014    PT SHORT TERM GOAL #1   Title ability to  contract abdominal muscles correctly to assist in bowel defecation   Time 4   Period Weeks   Status Achieved   PT SHORT TERM GOAL #2   Title ability to produce a pelvic floor contraction anally with a lift   Time 4  Period Weeks   Status Achieved   PT SHORT TERM GOAL #3   Title independent with scar tissue massage to do at home   Time 4   Period Weeks   Status Achieved   PT SHORT TERM GOAL #4   Title posture awarness to reduce tension in pelvic floor   Time 4   Period Weeks   Status Achieved           PT Long Term Goals - 03/16/15 1030    PT LONG TERM GOAL #1   Title independent with HEP and understand how to progress herself   Time 12   Period Weeks   Status Achieved   PT LONG TERM GOAL #2   Title ability to push out a bowel movement with >/= 50% greater ease due to improve pelvic floor strength 4/5   Time 12   Period Weeks   Status Achieved   PT LONG TERM GOAL #3   Title abdominal strength 4/5 to assist in fully emptying her bladder and bowels   Time 12   Period Weeks   Status Achieved   PT LONG TERM GOAL #4   Title ability to have a bowel movement without urinating due to improved coordination of the pelvic floor muscles   Time 12   Period Weeks   Status Achieved   PT LONG TERM GOAL #5   Title ability to wait 2 hours to have a bowel movement or urinating   Time 12   Period Weeks   Status Achieved               Plan - 03/16/15 1056    Clinical Impression Statement Patient is independent with HEP.  Patient has met all of her STG and LTG's.  Patient has increased strength in bil. hips.  Patient pushing her bowels out with 75% greater ease. Patient FOTO score is 42% limitation due to right elbow pain.     Pt will benefit from skilled therapeutic intervention in order to improve on the following deficits Decreased activity tolerance;Decreased mobility;Decreased strength;Impaired sensation;Impaired flexibility;Pain;Decreased endurance;Increased fascial  restricitons;Decreased coordination   Rehab Potential Good   PT Treatment/Interventions ADLs/Self Care Home Management;Biofeedback;Functional mobility training;Therapeutic activities;Therapeutic exercise;Neuromuscular re-education;Manual techniques;Patient/family education;Scar mobilization;Passive range of motion   PT Next Visit Plan Discharge to HEP   PT Home Exercise Plan current HEP   Consulted and Agree with Plan of Care Patient        Problem List Patient Active Problem List   Diagnosis Date Noted  . History of ileostomy 05/02/2014  . DVT (deep venous thrombosis) 01/14/2014  . Urinary tract infection 01/13/2014  . Large bowel perforation 09/13/2013  . Rectal cancer, 12 - 15 cm from anal verge 05/29/2013  . Herniation of cervical intervertebral disc with radiculopathy 01/03/2012    GRAY,CHERYL,PT 03/16/2015, 10:59 AM  [facility] [address], [place], [place], [address] Phone: [phone]   Fax:  [phone]  PHYSICAL THERAPY DISCHARGE SUMMARY  Visits from Start of Care: 19  Current functional level related to goals / functional outcomes: See above   Remaining deficits: See above   Education / Equipment: HEP Plan: Patient agrees to discharge.  Patient goals were met. Patient is being discharged due to meeting the stated rehab goals. Thank you for the referral.  Earlie Counts, PT 03/16/2015 10:59 AM   ?????

## 2015-03-18 ENCOUNTER — Encounter: Payer: 59 | Admitting: Physical Therapy

## 2015-03-23 ENCOUNTER — Ambulatory Visit: Payer: 59 | Admitting: Physical Therapy

## 2015-03-30 ENCOUNTER — Encounter: Payer: 59 | Admitting: Physical Therapy

## 2015-04-01 ENCOUNTER — Encounter: Payer: 59 | Admitting: Physical Therapy

## 2015-04-02 ENCOUNTER — Ambulatory Visit: Payer: 59 | Admitting: Physical Therapy

## 2015-04-06 ENCOUNTER — Encounter: Payer: 59 | Admitting: Physical Therapy

## 2015-04-08 ENCOUNTER — Encounter: Payer: 59 | Admitting: Physical Therapy

## 2015-05-20 ENCOUNTER — Telehealth: Payer: Self-pay | Admitting: *Deleted

## 2015-05-20 DIAGNOSIS — C20 Malignant neoplasm of rectum: Secondary | ICD-10-CM

## 2015-05-20 NOTE — Telephone Encounter (Signed)
Received message from pt 11/28 re: CT scan prior to MD visit on 12/30.  Left message on pt cell and home# that scan is scheduled for 12/27 lab at 8am then scan at 8:30.  Pt appt with Dr. Benay Spice is 06/19/15 to review results.  Any questions, please contact office.

## 2015-06-16 ENCOUNTER — Ambulatory Visit (HOSPITAL_COMMUNITY)
Admission: RE | Admit: 2015-06-16 | Discharge: 2015-06-16 | Disposition: A | Discharge status: Home / Self Care | Payer: Commercial Managed Care - HMO | Source: Ambulatory Visit | Attending: Oncology | Admitting: Oncology

## 2015-06-16 ENCOUNTER — Encounter (HOSPITAL_COMMUNITY): Payer: Self-pay

## 2015-06-16 ENCOUNTER — Other Ambulatory Visit (HOSPITAL_BASED_OUTPATIENT_CLINIC_OR_DEPARTMENT_OTHER): Payer: 59

## 2015-06-16 DIAGNOSIS — C20 Malignant neoplasm of rectum: Secondary | ICD-10-CM

## 2015-06-16 DIAGNOSIS — N1330 Unspecified hydronephrosis: Secondary | ICD-10-CM | POA: Insufficient documentation

## 2015-06-16 DIAGNOSIS — R590 Localized enlarged lymph nodes: Secondary | ICD-10-CM | POA: Diagnosis not present

## 2015-06-16 DIAGNOSIS — K429 Umbilical hernia without obstruction or gangrene: Secondary | ICD-10-CM | POA: Diagnosis not present

## 2015-06-16 DIAGNOSIS — Z9221 Personal history of antineoplastic chemotherapy: Secondary | ICD-10-CM | POA: Insufficient documentation

## 2015-06-16 DIAGNOSIS — K769 Liver disease, unspecified: Secondary | ICD-10-CM | POA: Diagnosis not present

## 2015-06-16 LAB — BASIC METABOLIC PANEL
ANION GAP: 9 meq/L (ref 3–11)
BUN: 13.2 mg/dL (ref 7.0–26.0)
CO2: 27 mEq/L (ref 22–29)
Calcium: 9.3 mg/dL (ref 8.4–10.4)
Chloride: 104 mEq/L (ref 98–109)
Creatinine: 0.9 mg/dL (ref 0.6–1.1)
EGFR: 78 mL/min/{1.73_m2} — AB (ref >90)
Glucose: 90 mg/dl (ref 70–140)
Potassium: 4.2 mEq/L (ref 3.5–5.1)
Sodium: 139 mEq/L (ref 136–145)

## 2015-06-16 LAB — CBC WITH DIFFERENTIAL/PLATELET
BASO%: 0.5 % (ref 0.0–2.0)
Basophils Absolute: 0 10e3/uL (ref 0.0–0.1)
EOS%: 3.4 % (ref 0.0–7.0)
Eosinophils Absolute: 0.1 10e3/uL (ref 0.0–0.5)
HCT: 42.6 % (ref 34.8–46.6)
HGB: 14.2 g/dL (ref 11.6–15.9)
LYMPH%: 17.1 % (ref 14.0–49.7)
MCH: 30.6 pg (ref 25.1–34.0)
MCHC: 33.3 g/dL (ref 31.5–36.0)
MCV: 92.1 fL (ref 79.5–101.0)
MONO#: 0.3 10e3/uL (ref 0.1–0.9)
MONO%: 8.7 % (ref 0.0–14.0)
NEUT#: 2.5 10e3/uL (ref 1.5–6.5)
NEUT%: 70.3 % (ref 38.4–76.8)
Platelets: 169 10e3/uL (ref 145–400)
RBC: 4.63 10e6/uL (ref 3.70–5.45)
RDW: 12.3 % (ref 11.2–14.5)
WBC: 3.5 10e3/uL — ABNORMAL LOW (ref 3.9–10.3)
lymph#: 0.6 10e3/uL — ABNORMAL LOW (ref 0.9–3.3)

## 2015-06-16 MED ORDER — IOHEXOL 300 MG/ML  SOLN
100.0000 mL | Freq: Once | INTRAMUSCULAR | Status: AC | PRN
  Administered 2015-06-16: 100 mL via INTRAVENOUS

## 2015-06-19 ENCOUNTER — Telehealth: Payer: Self-pay | Admitting: Oncology

## 2015-06-19 ENCOUNTER — Ambulatory Visit (HOSPITAL_BASED_OUTPATIENT_CLINIC_OR_DEPARTMENT_OTHER): Payer: Commercial Managed Care - HMO | Admitting: Oncology

## 2015-06-19 VITALS — BP 120/81 | HR 71 | Temp 98.8°F | Resp 18 | Wt 150.9 lb

## 2015-06-19 DIAGNOSIS — Z85038 Personal history of other malignant neoplasm of large intestine: Secondary | ICD-10-CM | POA: Diagnosis not present

## 2015-06-19 DIAGNOSIS — C20 Malignant neoplasm of rectum: Secondary | ICD-10-CM

## 2015-06-19 DIAGNOSIS — G620 Drug-induced polyneuropathy: Secondary | ICD-10-CM | POA: Diagnosis not present

## 2015-06-19 NOTE — Progress Notes (Signed)
[place] OFFICE PROGRESS NOTE   Diagnosis:  Rectal cancer  INTERVAL HISTORY:    [name] returns as scheduled. She continues to have recurrent urinary tract infections. 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 She saw Dr. Amalia Hailey and underwent placement of a sacral nerve stimulator. This has helped the stool frequency. She now has approximately 10 bowel movements per day. She has urinary urgency and occasional incontinence.   she was on pancreatic enzyme replacement by Dr. Juanita Craver off and the stool no longer floats.   She has noted discomfort in the left flank region intermittently for the past 2 weeks.   [name] returned to work this week.  She has persistent numbness and pain in the hands and feet. This improved with Cymbalta, but she   Could not tolerate Cymbalta and is now on Lyrica.  Objective:  Vital signs in last 24 hours:  Blood pressure 120/81, pulse 71, temperature 98.8 F (37.1 C), temperature source Oral, resp. rate 18, weight 150 lb 14.4 oz (68.448 kg), SpO2 100 %.    HEENT:  Neck without mass Lymphatics:  No cervical, supraclavicular, axillary, or inguinal nodes Resp:  Lungs clear bilaterally Cardio:  Regular rate and rhythm GI:  No hepatomegaly, no mass, nontender Vascular:  No leg edema  musculoskeletal: no flank tenderness. Sacral stimulator at the left iliac region    Lab Results:  Lab Results  Component Value Date   WBC 3.5* 06/16/2015   HGB 14.2 06/16/2015   HCT 42.6 06/16/2015   MCV 92.1 06/16/2015   PLT 169 06/16/2015   NEUTROABS 2.5 06/16/2015    BUN 13.2, creatinine 0.9  Lab Results  Component Value Date   CEA 3.1 12/18/2014    Imaging:  Ct Chest W Contrast  06/16/2015  CLINICAL DATA:  Restaging rectal cancer, chemotherapy complete. EXAM: CT CHEST, ABDOMEN, AND PELVIS WITH CONTRAST TECHNIQUE: Multidetector CT imaging of the chest, abdomen and pelvis was  performed following the standard protocol during bolus administration of intravenous contrast. CONTRAST:  167m OMNIPAQUE IOHEXOL 300 MG/ML  SOLN COMPARISON:  06/02/2014. FINDINGS: CT CHEST FINDINGS Mediastinum/Lymph Nodes: No pathologically enlarged mediastinal, hilar or axillary lymph nodes. Heart size normal. No pericardial effusion. Lungs/Pleura: Minimal subsegmental volume loss/ scarring in the lower lobes. Lungs are otherwise clear. No pleural fluid. Airway is unremarkable. Musculoskeletal: No worrisome lytic or sclerotic lesions. Degenerative changes are seen in the spine. CT ABDOMEN PELVIS FINDINGS Hepatobiliary: 3 mm low-attenuation lesion in the right hepatic lobe is unchanged. Liver and gallbladder are otherwise unremarkable. No biliary ductal dilatation. Pancreas: Negative. Spleen: Negative. Adrenals/Urinary Tract: Adrenal glands and right kidney are unremarkable. Moderate left hydronephrosis is new, without definite cause identified. The distal left ureter is decompressed. Kidneys are otherwise unremarkable. Bladder is low in volume. Stomach/Bowel: Stomach and small bowel are unremarkable. Appendectomy. Majority of the colon is unremarkable. Postoperative changes are seen in the rectum. Slight thickening of the posterior wall of the rectum appears unchanged. Presacral soft tissue thickening with locules of air along the ventral margin, increased from 06/02/2014. No definite fluid. An opacified tract is seen along the 7 o'clock location of the rectum (series 2, image 99). Vascular/Lymphatic: Vascular structures are unremarkable. 10 mm right external iliac lymph node (image 96), previously 5 mm. Otherwise, no pathologically enlarged lymph nodes. Reproductive: Intrauterine contraceptive device is in place. Ovaries are visualized. Other: Presacral soft tissue thickening and air, as described above. No free fluid. Tiny periumbilical  hernia contains fat. Mesenteries and peritoneum are otherwise unremarkable.  Musculoskeletal: No worrisome lytic or sclerotic lesions. Degenerative changes are seen in the spine. IMPRESSION: 1. Postoperative changes in the rectum with associated presacral soft tissue thickening. Increased amount of associated loculated air. 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 2. 10 mm right external iliac lymph node, enlarged, nonspecific. Continued attention on followup exams is warranted. 3. Moderate left hydronephrosis, new, without clear etiology. Electronically Signed   By: Lorin Picket M.D.   On: 06/16/2015 09:31   Ct Abdomen Pelvis W Contrast  06/16/2015  CLINICAL DATA:  Restaging rectal cancer, chemotherapy complete. EXAM: CT CHEST, ABDOMEN, AND PELVIS WITH CONTRAST TECHNIQUE: Multidetector CT imaging of the chest, abdomen and pelvis was performed following the standard protocol during bolus administration of intravenous contrast. CONTRAST:  143m OMNIPAQUE IOHEXOL 300 MG/ML  SOLN COMPARISON:  06/02/2014. FINDINGS: CT CHEST FINDINGS Mediastinum/Lymph Nodes: No pathologically enlarged mediastinal, hilar or axillary lymph nodes. Heart size normal. No pericardial effusion. Lungs/Pleura: Minimal subsegmental volume loss/ scarring in the lower lobes. Lungs are otherwise clear. No pleural fluid. Airway is unremarkable. Musculoskeletal: No worrisome lytic or sclerotic lesions. Degenerative changes are seen in the spine. CT ABDOMEN PELVIS FINDINGS Hepatobiliary: 3 mm low-attenuation lesion in the right hepatic lobe is unchanged. Liver and gallbladder are otherwise unremarkable. No biliary ductal dilatation. Pancreas: Negative. Spleen: Negative. Adrenals/Urinary Tract: Adrenal glands and right kidney are unremarkable. Moderate left hydronephrosis is new, without definite cause identified. The distal left ureter is decompressed. Kidneys are otherwise unremarkable. Bladder is low in volume. Stomach/Bowel: Stomach and small bowel are unremarkable. Appendectomy. Majority of the colon is  unremarkable. Postoperative changes are seen in the rectum. Slight thickening of the posterior wall of the rectum appears unchanged. Presacral soft tissue thickening with locules of air along the ventral margin, increased from 06/02/2014. No definite fluid. An opacified tract is seen along the 7 o'clock location of the rectum (series 2, image 99). Vascular/Lymphatic: Vascular structures are unremarkable. 10 mm right external iliac lymph node (image 96), previously 5 mm. Otherwise, no pathologically enlarged lymph nodes. Reproductive: Intrauterine contraceptive device is in place. Ovaries are visualized. Other: Presacral soft tissue thickening and air, as described above. No free fluid. Tiny periumbilical hernia contains fat. Mesenteries and peritoneum are otherwise unremarkable. Musculoskeletal: No worrisome lytic or sclerotic lesions. Degenerative changes are seen in the spine. IMPRESSION: 1. Postoperative changes in the rectum with associated presacral soft tissue thickening. Increased amount of associated loculated air. 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 2. 10 mm right external iliac lymph node, enlarged, nonspecific. Continued attention on followup exams is warranted. 3. Moderate left hydronephrosis, new, without clear etiology. Electronically Signed   By: MLorin PicketM.D.   On: 06/16/2015 09:31   images reviewed with Renee. BAyuband  Her husband  Medications: I have reviewed the patient's current medications.  Assessment/Plan: 1. Rectal cancer, clinical stage III (T3 N1) status post biopsy of a rectal mass on 05/24/2013 confirming adenocarcinoma. Endoscopic ultrasound 05/31/2013 measured the mass at 11 cm from the anal verge, uT3uN1.  Initiation of radiation and concurrent Xeloda 06/18/2013; completion 07/26/2013.   Status post low anterior resection 09/02/2013. Invasive adenocarcinoma, 3.7 cm, negative margins; metastatic carcinoma in 7 of 11 lymph nodes; extensive lymph  vascular involvement by tumor.   Cycle 1 adjuvant FOLFOX 10/15/2013.   Cycle 2 adjuvant FOLFOX 10/29/2013.   Cycle 3 adjuvant FOLFOX 11/14/2013.   Cycle 4 adjuvant FOLFOX 11/27/2013.   Cycle 5 adjuvant FOLFOX 12/10/2013  Cycle 6 adjuvant FOLFOX 12/24/2013.   Cycle 7 adjuvant FOLFOX 01/14/2014.   Cycle 8 adjuvant FOLFOX 01/28/2014   Cycle 9 adjuvant FOLFOX 02/11/2014 (oxaliplatin dose reduced secondary to thrombocytopenia)   Surveillance CT scans 06/02/2014 with no evidence of recurrent rectal cancer   surveillance CT scans  06/16/2015 with no evidence of recurrent rectal cancer, 10 mm right external iliac node , increased amount of loculated air associated with the presacral soft tissue thickening , new left hydronephrosis 2. Post colonoscopy bowel perforation confirmed on abdomen CT 05/28/2013. 3. Single indeterminate liver lesion on the abdomen CT 05/28/2013. MRI on 06/18/2013 confirmed 2 tiny liver lesions, indeterminate-favored to be benign cysts. 4. History of cervical and lumbar disc disease.  5. History of HELLP syndrome with first pregnancy.  6. Delayed nausea following cycle 1 FOLFOX. She receives Aloxi and Emend. She continues to experience delayed nausea. Prophylactic Decadron added with cycle 4. 7. History of pain with bowel movements. 8. Anastomotic leak on CT 09/13/2013. Status post exploratory laparotomy with drainage of a pelvic abscess, diverting loop ileostomy. Drainage catheter removed 10/23/2013.  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 Perineal numbness, urinary retention. The urinary retention has improved. 10. Perineal pain-improved. 11. Neutropenia secondary chemotherapy-she received Neulasta. 12. Urinary tract infection, Escherichia coli-11/03/2013. 13. Venous engorgement at the left anterior chest and left arm with swelling of the  left arm and hand. Venous Doppler 11/14/2013 positive for DVT. She began Lovenox 11/14/2013 and was switched to xarelto 12/10/2013. Xarelto discontinued early January 2016. 14. Fever 11/20/2013. Blood and urine cultures negative. The fever resolved within 24 hours. She continued to have a single episode of fever following chemotherapy 15. History of a herpetic lip lesion 16. 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 17. Escherichia coli bacteremia, urinary source, 01/14/2014. She completed a 10 day course of ceftriaxone. 18. 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 Anxiety/depression.  20. Port-A-Cath removal 06/09/2014. 21. Escherichia coli urinary tract infection 06/24/2014. She completed a course of ciprofloxacin. 22.  frequent bowel movements following surgery/radiation -status post placement of a sacral stimulator 11/21/2014   Disposition:   Renee. Steffey is included remission from rectal cancer. She has persistent oxaliplatin neuropathy , and neuropathic bladder /bowel symptoms following surgery and radiation. The  Rectal frequency has improved following placement of a sacral nerve stimulator.   The restaging CT shows no evidence of progress of rectal cancer. There is new left hydronephrosis. I discussed the case with Dr. Amalia Hailey and he will see her to evaluate the hydronephrosis.   She will return for an office visit here in 3 weeks. We will consider obtaining a PET scan or pelvic MRI if there is evidence of tumor  on Dr. Amalia Hailey evaluation.  Betsy Coder, MD  06/19/2015  11:51 AM

## 2015-06-19 NOTE — Telephone Encounter (Signed)
Gave patient avs report and appointments for January. Spoke with Seth Bake at Dr. Amalia Hailey office [phone] re appointment and per Seth Bake next available is not until 2/7 - per Seth Bake she will send message requesting a sooner appointment and call patient with date/time. Seth Bake also made aware per Patient that Dr. Benay Spice has spoken with Dr. Amalia Hailey so he should be familiar with the case. Patient aware Dr. Amalia Hailey office will call her re appointment.

## 2015-07-07 ENCOUNTER — Telehealth: Payer: Self-pay | Admitting: Nurse Practitioner

## 2015-07-07 ENCOUNTER — Telehealth: Payer: Self-pay | Admitting: *Deleted

## 2015-07-07 DIAGNOSIS — C20 Malignant neoplasm of rectum: Secondary | ICD-10-CM

## 2015-07-07 NOTE — Telephone Encounter (Signed)
Called and left a message with lab and follow up

## 2015-07-07 NOTE — Telephone Encounter (Signed)
Received message from pt asking "I just saw Dr. Benay Spice few weeks ago; why do I need to come in and see Lattie Haw Thursday?"  Per Dr. Benay Spice; confirmed with pt IF she has seen Dr. Amalia Hailey and if she has  had stent put in.  Pt states she has NOT; has appt with Dr. Amalia Hailey 07/17/15.  Per Dr. Benay Spice; office will cancel 1/19 appt with Lattie Haw and schedulers will call re: next appt after 1/27.  Also instructed pt MD wants to check creatinine this week.  Pt confirmed she will come get labs; schedulers will notify of date/time.

## 2015-07-09 ENCOUNTER — Ambulatory Visit: Payer: Commercial Managed Care - HMO | Admitting: Nurse Practitioner

## 2015-07-09 ENCOUNTER — Other Ambulatory Visit (HOSPITAL_BASED_OUTPATIENT_CLINIC_OR_DEPARTMENT_OTHER): Payer: Commercial Managed Care - HMO

## 2015-07-09 DIAGNOSIS — Z85038 Personal history of other malignant neoplasm of large intestine: Secondary | ICD-10-CM

## 2015-07-09 DIAGNOSIS — C20 Malignant neoplasm of rectum: Secondary | ICD-10-CM

## 2015-07-09 LAB — BASIC METABOLIC PANEL
ANION GAP: 7 meq/L (ref 3–11)
BUN: 19.1 mg/dL (ref 7.0–26.0)
CHLORIDE: 106 meq/L (ref 98–109)
CO2: 26 mEq/L (ref 22–29)
Calcium: 8.8 mg/dL (ref 8.4–10.4)
Creatinine: 0.9 mg/dL (ref 0.6–1.1)
EGFR: 73 mL/min/{1.73_m2} — ABNORMAL LOW (ref >90)
GLUCOSE: 81 mg/dL (ref 70–140)
Potassium: 4.2 mEq/L (ref 3.5–5.1)
Sodium: 140 mEq/L (ref 136–145)

## 2015-07-21 ENCOUNTER — Ambulatory Visit (HOSPITAL_BASED_OUTPATIENT_CLINIC_OR_DEPARTMENT_OTHER): Payer: Managed Care, Other (non HMO) | Admitting: Nurse Practitioner

## 2015-07-21 ENCOUNTER — Telehealth: Payer: Self-pay | Admitting: Oncology

## 2015-07-21 VITALS — BP 115/59 | HR 97 | Temp 99.4°F | Resp 17 | Ht 64.0 in | Wt 150.7 lb

## 2015-07-21 DIAGNOSIS — C20 Malignant neoplasm of rectum: Secondary | ICD-10-CM

## 2015-07-21 NOTE — Telephone Encounter (Signed)
Gave patient avs report and appointments for February and May. Due to lab already closed for today - patient will return for lab tomorrow at 12:15 pm.

## 2015-07-21 NOTE — Progress Notes (Addendum)
[place] OFFICE PROGRESS NOTE   Diagnosis:  Rectal cancer  INTERVAL HISTORY:   [name] felt returns as scheduled. Bowel habits continue to be very erratic. She alternate constipation and diarrhea. She denies dysuria. Persistent numbness in the hands and feet.  Objective:  Vital signs in last 24 hours:  Blood pressure 115/59, pulse 97, temperature 99.4 F (37.4 C), temperature source Oral, resp. rate 17, height 5\' 4"  (1.626 m), weight 150 lb 11.2 oz (68.357 kg), SpO2 94 %.    HEENT: No thrush or ulcers. Resp: Lungs clear bilaterally. Cardio: Regular rate and rhythm. GI: Abdomen soft and nontender. No hepatomegaly. Vascular: No leg edema.  Lab Results:  Lab Results  Component Value Date   WBC 3.5* 06/16/2015   HGB 14.2 06/16/2015   HCT 42.6 06/16/2015   MCV 92.1 06/16/2015   PLT 169 06/16/2015   NEUTROABS 2.5 06/16/2015    Imaging:  No results found.  Medications: I have reviewed the patient's current medications.  Assessment/Plan: 1. Rectal cancer, clinical stage III (T3 N1) status post biopsy of a rectal mass on 05/24/2013 confirming adenocarcinoma. Endoscopic ultrasound 05/31/2013 measured the mass at 11 cm from the anal verge, uT3uN1.  Initiation of radiation and concurrent Xeloda 06/18/2013; completion 07/26/2013.   Status post low anterior resection 09/02/2013. Invasive adenocarcinoma, 3.7 cm, negative margins; metastatic carcinoma in 7 of 11 lymph nodes; extensive lymph vascular involvement by tumor.   Cycle 1 adjuvant FOLFOX 10/15/2013.   Cycle 2 adjuvant FOLFOX 10/29/2013.   Cycle 3 adjuvant FOLFOX 11/14/2013.   Cycle 4 adjuvant FOLFOX 11/27/2013.   Cycle 5 adjuvant FOLFOX 12/10/2013   Cycle 6 adjuvant FOLFOX 12/24/2013.   Cycle 7 adjuvant FOLFOX 01/14/2014.   Cycle 8 adjuvant FOLFOX 01/28/2014   Cycle 9 adjuvant FOLFOX 02/11/2014 (oxaliplatin dose reduced secondary to thrombocytopenia)   Surveillance CT scans  06/02/2014 with no evidence of recurrent rectal cancer  surveillance CT scans 06/16/2015 with no evidence of recurrent rectal cancer, 10 mm right external iliac node , increased amount of loculated air associated with the presacral soft tissue thickening , new left hydronephrosis 2. Post colonoscopy bowel perforation confirmed on abdomen CT 05/28/2013. 3. Single indeterminate liver lesion on the abdomen CT 05/28/2013. MRI on 06/18/2013 confirmed 2 tiny liver lesions, indeterminate-favored to be benign cysts. 4. History of cervical and lumbar disc disease.  5. History of HELLP syndrome with first pregnancy.  6. Delayed nausea following cycle 1 FOLFOX. She receives Aloxi and Emend. She continues to experience delayed nausea. Prophylactic Decadron added with cycle 4. 7. History of pain with bowel movements. 8. Anastomotic leak on CT 09/13/2013. Status post exploratory laparotomy with drainage of a pelvic abscess, diverting loop ileostomy. Drainage catheter removed 10/23/2013.  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 Perineal numbness, urinary retention. The urinary retention has improved. 10. Perineal pain-improved. 11. Neutropenia secondary chemotherapy-she received Neulasta. 12. Urinary tract infection, Escherichia coli-11/03/2013. 13. Venous engorgement at the left anterior chest and left arm with swelling of the left arm and hand. Venous Doppler 11/14/2013 positive for DVT. She began Lovenox 11/14/2013 and was switched to xarelto 12/10/2013. Xarelto discontinued early January 2016. 14. Fever 11/20/2013. Blood and urine cultures negative. The fever resolved within 24 hours. She continued to have a single episode of fever following chemotherapy 15. History of a herpetic lip lesion 16. 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 17. Escherichia coli bacteremia, urinary source,  01/14/2014. She completed a 10 day course of ceftriaxone. 18. 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 Anxiety/depression. 20. Port-A-Cath removal 06/09/2014. 21. Escherichia coli urinary tract infection 06/24/2014. She completed a course of ciprofloxacin. 22. frequent bowel movements following surgery/radiation -status post placement of a sacral stimulator 11/21/2014 23. CT 06/16/2015 with new left hydronephrosis status post cystoscopy with placement of a double-J stent 07/17/2015. No tumors or stones were seen in the bladder. Both ureteral orifices were unremarkable. On the left side retrograde study was performed. The distal ureter was narrowed and then a little bit more dilated proximally with hydronephrosis noted. There was no evidence of intramural tumor but the area did seem a bit strictured.    Disposition: Ms. Leidel appears stable. She underwent placement of a left ureteral stent on 07/17/2015. The distal ureter was narrowed with no tumor noted. She continues follow-up with Dr. Amalia Hailey.  The left ureter stricture may be related to scar tissue from previous surgery, radiation. She understands the possibility it could also be related to cancer. Her case will be presented at an upcoming tumor conference to decide on additional imaging. We are obtaining a CEA today.  We scheduled a return visit and CEA in 4 months. She will contact the office in the interim with any problems. Her next appointment will be adjusted pending recommendations from the tumor conference.  Patient seen with Dr. Benay Spice. 25 minutes were spent face-to-face at today's visit with the majority of that time involved in counseling/coordination of care.     Ned Card ANP/GNP-BC   07/21/2015  3:52 PM  This was a shared visit with Ned Card. Ms. Mccuskey underwent placement of a left ureter stent last week. I reviewed the operative note. There was no  intraluminal tumor. The left ureter stricture may be related to surgery/radiation or potentially recurrent tumor in the pelvis. I will present her case at the GI tumor conference to discuss the indication for additional imaging.  She will return for an office visit and CEA in 4 months.  [name], M.D.

## 2015-07-22 ENCOUNTER — Other Ambulatory Visit (HOSPITAL_BASED_OUTPATIENT_CLINIC_OR_DEPARTMENT_OTHER): Payer: Managed Care, Other (non HMO)

## 2015-07-22 DIAGNOSIS — C20 Malignant neoplasm of rectum: Secondary | ICD-10-CM

## 2015-07-23 LAB — CEA: CEA1: 3.8 ng/mL (ref 0.0–4.7)

## 2015-07-23 LAB — CEA (PARALLEL TESTING): CEA: 2.8 ng/mL (ref 0.0–5.0)

## 2015-07-24 ENCOUNTER — Telehealth: Payer: Self-pay | Admitting: *Deleted

## 2015-07-24 NOTE — Telephone Encounter (Signed)
Left message on generic voicemail: Labs available to view online.

## 2015-07-24 NOTE — Telephone Encounter (Signed)
-----   Message from Ladell Pier, MD sent at 07/23/2015  6:57 PM EST ----- Please call patient, [name] is normal

## 2015-08-05 ENCOUNTER — Encounter: Payer: Self-pay | Admitting: *Deleted

## 2015-08-05 NOTE — Progress Notes (Signed)
Discussed case in tumor board today: Needs MSI and IHC testing on original resection path: Emailed request to pathology.  Patient: [name], [name] Collected: 09/02/2013 Client: [facility] Accession: [id number] Received: 09/02/2013 [name] DOB: Nov 09, 1960 Age: 55 Gender: F Reported: 09/03/2013 [address] Patient Ph: [phone] MRN #: [id number] [place], [place] [address] Visit #: [id number] Chart #: Phone: [phone] Fax:   [place]

## 2015-08-06 ENCOUNTER — Other Ambulatory Visit (HOSPITAL_COMMUNITY)
Admission: RE | Admit: 2015-08-06 | Discharge: 2015-08-06 | Disposition: A | Discharge status: Home / Self Care | Payer: Managed Care, Other (non HMO) | Source: Ambulatory Visit | Attending: Oncology | Admitting: Oncology

## 2015-08-06 DIAGNOSIS — Z029 Encounter for administrative examinations, unspecified: Secondary | ICD-10-CM | POA: Diagnosis present

## 2015-08-17 ENCOUNTER — Encounter (HOSPITAL_COMMUNITY): Payer: Self-pay

## 2015-10-15 ENCOUNTER — Telehealth: Payer: Self-pay

## 2015-10-15 NOTE — Telephone Encounter (Signed)
Patient called with a question for MD.  Her step son is coming home from the army with "latent TB ", he has been taking antibiotics.  She would like to know if this is contagious and is it dangerous for her or her family to be around him?  Could MD's nurse call her back with an answer.

## 2015-10-16 NOTE — Telephone Encounter (Signed)
Per Dr Benay Spice , I told pt to contact her PCP for answer.

## 2015-11-17 ENCOUNTER — Ambulatory Visit (HOSPITAL_BASED_OUTPATIENT_CLINIC_OR_DEPARTMENT_OTHER): Payer: Managed Care, Other (non HMO) | Admitting: Oncology

## 2015-11-17 ENCOUNTER — Other Ambulatory Visit (HOSPITAL_BASED_OUTPATIENT_CLINIC_OR_DEPARTMENT_OTHER): Payer: Managed Care, Other (non HMO)

## 2015-11-17 ENCOUNTER — Telehealth: Payer: Self-pay | Admitting: Oncology

## 2015-11-17 VITALS — BP 129/91 | HR 56 | Temp 98.1°F | Resp 18 | Ht 64.0 in | Wt 153.6 lb

## 2015-11-17 DIAGNOSIS — Z85048 Personal history of other malignant neoplasm of rectum, rectosigmoid junction, and anus: Secondary | ICD-10-CM

## 2015-11-17 DIAGNOSIS — C20 Malignant neoplasm of rectum: Secondary | ICD-10-CM

## 2015-11-17 DIAGNOSIS — R2241 Localized swelling, mass and lump, right lower limb: Secondary | ICD-10-CM

## 2015-11-17 DIAGNOSIS — L98491 Non-pressure chronic ulcer of skin of other sites limited to breakdown of skin: Secondary | ICD-10-CM | POA: Diagnosis not present

## 2015-11-17 DIAGNOSIS — N359 Urethral stricture, unspecified: Secondary | ICD-10-CM

## 2015-11-17 LAB — BASIC METABOLIC PANEL
Anion Gap: 8 mEq/L (ref 3–11)
BUN: 18.1 mg/dL (ref 7.0–26.0)
CHLORIDE: 105 meq/L (ref 98–109)
CO2: 26 mEq/L (ref 22–29)
Calcium: 9.2 mg/dL (ref 8.4–10.4)
Creatinine: 0.8 mg/dL (ref 0.6–1.1)
EGFR: 79 mL/min/{1.73_m2} — AB (ref >90)
Glucose: 95 mg/dl (ref 70–140)
Potassium: 3.8 mEq/L (ref 3.5–5.1)
SODIUM: 139 meq/L (ref 136–145)

## 2015-11-17 MED ORDER — ACYCLOVIR 400 MG PO TABS: 400.0000 mg | ORAL_TABLET | Freq: Two times a day (BID) | ORAL | Status: DC | PRN

## 2015-11-17 NOTE — Progress Notes (Signed)
[place] OFFICE PROGRESS NOTE   Diagnosis: Rectal cancer  INTERVAL HISTORY:   [name] returns as scheduled. She feels well. She continues to have frequent bowel movements, 5-10 per day. She is followed by Dr. Amalia Hailey for management of the left ureteral stricture. The stent was recently removed and she developed a urinary tract infection. She is taking Macrobid. There is persistent left hydronephrosis. She is scheduled for a renal scan next week and then an appointment with Dr. Amalia Hailey.  She is concerned she is developing "thrush "over the angle of the lips. She has had ulcerated areas that have not healed. She has frequent "cold sores ". She has used acyclovir to treat cold sores in the past.  She continues to have peripheral numbness and pain. The pain improves with Lyrica.  Objective:  Vital signs in last 24 hours:  Blood pressure 129/91, pulse 56, temperature 98.1 F (36.7 C), temperature source Oral, resp. rate 18, height _0  (1.626 m), weight 153 lb 9.6 oz (69.673 kg), SpO2 100 %.    HEENT: Oral cavity without thrush or ulcers. A few small ulcers at the lower lip with angular chelitis Lymphatics: No cervical, supraclavicular, axillary, or inguinal nodes Resp: Lungs clear bilaterally Cardio: Regular rate and rhythm GI: No hepatomegaly, no mass, nontender Vascular: No leg edema  Skin: 3 mm round, nonpigmented, nodular lesion at the right lower leg , sunburn at the upper chest and back    Lab Results:    Lab Results  Component Value Date   CEA1 3.8 07/22/2015   Medications: I have reviewed the patient's current medications.  Assessment/Plan: 1. Rectal cancer, clinical stage III (T3 N1) status post biopsy of a rectal mass on 05/24/2013 confirming adenocarcinoma. Endoscopic ultrasound 05/31/2013 measured the mass at 11 cm from the anal verge, uT3uN1.  Initiation of radiation and concurrent Xeloda 06/18/2013; completion 07/26/2013.   Status post  low anterior resection 09/02/2013. Invasive adenocarcinoma, 3.7 cm, negative margins; metastatic carcinoma in 7 of 11 lymph nodes; extensive lymph vascular involvement by tumor. MSI-stable, no loss of mismatch repair protein expression  Cycle 1 adjuvant FOLFOX 10/15/2013.   Cycle 2 adjuvant FOLFOX 10/29/2013.   Cycle 3 adjuvant FOLFOX 11/14/2013.   Cycle 4 adjuvant FOLFOX 11/27/2013.   Cycle 5 adjuvant FOLFOX 12/10/2013   Cycle 6 adjuvant FOLFOX 12/24/2013.   Cycle 7 adjuvant FOLFOX 01/14/2014.   Cycle 8 adjuvant FOLFOX 01/28/2014   Cycle 9 adjuvant FOLFOX 02/11/2014 (oxaliplatin dose reduced secondary to thrombocytopenia)   Surveillance CT scans 06/02/2014 with no evidence of recurrent rectal cancer  surveillance CT scans 06/16/2015 with no evidence of recurrent rectal cancer, 10 mm right external iliac node , increased amount of loculated air associated with the presacral soft tissue thickening , new left hydronephrosis 2. Post colonoscopy bowel perforation confirmed on abdomen CT 05/28/2013. 3. Single indeterminate liver lesion on the abdomen CT 05/28/2013. MRI on 06/18/2013 confirmed 2 tiny liver lesions, indeterminate-favored to be benign cysts. 4. History of cervical and lumbar disc disease.  5. History of HELLP syndrome with first pregnancy.  6. Delayed nausea following cycle 1 FOLFOX. She receives Aloxi and Emend. She continues to experience delayed nausea. Prophylactic Decadron added with cycle 4. 7. History of pain with bowel movements. 8. Anastomotic leak on CT 09/13/2013. Status post exploratory laparotomy with drainage of a pelvic abscess, diverting loop ileostomy. Drainage catheter removed 10/23/2013.  Ileostomy reversal 05/03/2014  CT 06/02/2014 with a persistent presacral gas collection with evidence of a fistulous communication  with the rectum 9. Perineal numbness, urinary retention. The urinary retention has improved. 10. Perineal  pain-improved. 11. Neutropenia secondary chemotherapy-she received Neulasta. 12. Urinary tract infection, Escherichia coli-11/03/2013. 13. Venous engorgement at the left anterior chest and left arm with swelling of the left arm and hand. Venous Doppler 11/14/2013 positive for DVT. She began Lovenox 11/14/2013 and was switched to xarelto 12/10/2013. Xarelto discontinued early January 2016. 14. Fever 11/20/2013. Blood and urine cultures negative. The fever resolved within 24 hours. She continued to have a single episode of fever following chemotherapy 15. History of a herpetic lip lesion 16. 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 17. Escherichia coli bacteremia, urinary source, 01/14/2014. She completed a 10 day course of ceftriaxone. 18. 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 Anxiety/depression. 20. Port-A-Cath removal 06/09/2014. 21. Escherichia coli urinary tract infection 06/24/2014. She completed a course of ciprofloxacin. 22. frequent bowel movements following surgery/radiation -status post placement of a sacral stimulator 11/21/2014 23. CT 06/16/2015 with new left hydronephrosis status post cystoscopy with placement of a double-J stent 07/17/2015. No tumors or stones were seen in the bladder. Both ureteral orifices were unremarkable. On the left side retrograde study was performed. The distal ureter was narrowed and then a little bit more dilated proximally with hydronephrosis noted. There was no evidence of intramural tumor but the area did seem a bit strictured.  Stent removed, persistent hydronephrosis, followed by Dr. Amalia Hailey   Disposition:  [name] mains in clinical remission from rectal cancer. We will follow-up on the CEA from today. She will return for an office visit and restaging CTs in 6 months.  She will continue close follow-up with Dr. Amalia Hailey for management of the left ureteral stricture. She will try  a course of acyclovir for the lip ulcers. I recommended she follow-up with dermatology to evaluate the nodular lesion at the right leg and lip ulcerations  Renee Coder, MD  11/17/2015  1:14 PM

## 2015-11-17 NOTE — Telephone Encounter (Signed)
Gave and printed appt sched and avs for pt for NOv °

## 2015-11-17 NOTE — Addendum Note (Signed)
Addended by: Brien Few on: 11/17/2015 02:37 PM   Modules accepted: Orders

## 2015-11-18 ENCOUNTER — Telehealth: Payer: Self-pay | Admitting: *Deleted

## 2015-11-18 LAB — CEA: CEA1: 3.7 ng/mL (ref 0.0–4.7)

## 2015-11-18 LAB — CEA (PARALLEL TESTING): CEA: 2.7 ng/mL — AB

## 2015-11-18 NOTE — Telephone Encounter (Signed)
-----   Message from Ladell Pier, MD sent at 11/18/2015  3:31 PM EDT ----- Please call patient, cea is stable, f/u as scheduled

## 2015-11-18 NOTE — Telephone Encounter (Signed)
Per Dr. Benay Spice, pt notified that CEA is stable and to f/u as scheduled.  Pt has no questions or concerns at this time and is appreciative of call.

## 2016-05-06 ENCOUNTER — Ambulatory Visit (HOSPITAL_COMMUNITY)
Admission: RE | Admit: 2016-05-06 | Discharge: 2016-05-06 | Disposition: A | Discharge status: Home / Self Care | Payer: Managed Care, Other (non HMO) | Source: Ambulatory Visit | Attending: Oncology | Admitting: Oncology

## 2016-05-06 ENCOUNTER — Other Ambulatory Visit: Payer: Managed Care, Other (non HMO)

## 2016-05-06 ENCOUNTER — Other Ambulatory Visit (HOSPITAL_BASED_OUTPATIENT_CLINIC_OR_DEPARTMENT_OTHER): Payer: Managed Care, Other (non HMO)

## 2016-05-06 DIAGNOSIS — C20 Malignant neoplasm of rectum: Secondary | ICD-10-CM

## 2016-05-06 DIAGNOSIS — Z85048 Personal history of other malignant neoplasm of rectum, rectosigmoid junction, and anus: Secondary | ICD-10-CM

## 2016-05-06 DIAGNOSIS — N1330 Unspecified hydronephrosis: Secondary | ICD-10-CM | POA: Insufficient documentation

## 2016-05-06 DIAGNOSIS — R935 Abnormal findings on diagnostic imaging of other abdominal regions, including retroperitoneum: Secondary | ICD-10-CM | POA: Insufficient documentation

## 2016-05-06 LAB — BASIC METABOLIC PANEL
Anion Gap: 11 mEq/L (ref 3–11)
BUN: 15.7 mg/dL (ref 7.0–26.0)
CO2: 26 meq/L (ref 22–29)
Calcium: 9.8 mg/dL (ref 8.4–10.4)
Chloride: 100 mEq/L (ref 98–109)
Creatinine: 1 mg/dL (ref 0.6–1.1)
EGFR: 68 mL/min/{1.73_m2} — AB (ref >90)
GLUCOSE: 90 mg/dL (ref 70–140)
Potassium: 3.6 mEq/L (ref 3.5–5.1)
Sodium: 137 mEq/L (ref 136–145)

## 2016-05-06 LAB — CEA (IN HOUSE-CHCC): CEA (CHCC-In House): 4.11 ng/mL (ref 0.00–5.00)

## 2016-05-06 MED ORDER — SODIUM CHLORIDE 0.9 % IJ SOLN
INTRAMUSCULAR | Status: AC
  Filled 2016-05-06: qty 50

## 2016-05-06 MED ORDER — IOPAMIDOL (ISOVUE-300) INJECTION 61%
100.0000 mL | Freq: Once | INTRAVENOUS | Status: AC | PRN
  Administered 2016-05-06: 100 mL via INTRAVENOUS

## 2016-05-06 MED ORDER — IOPAMIDOL (ISOVUE-300) INJECTION 61%
INTRAVENOUS | Status: DC
Start: 2016-05-06 — End: 2016-05-07
  Filled 2016-05-06: qty 100

## 2016-05-07 LAB — CEA: CEA1: 3.7 ng/mL (ref 0.0–4.7)

## 2016-05-09 ENCOUNTER — Telehealth: Payer: Self-pay | Admitting: *Deleted

## 2016-05-09 NOTE — Telephone Encounter (Signed)
Call received from patient requesting CT results.  Per Dr. Benay Spice, no evidence of cancer.  Call placed back to patient to inform her of results per Dr. Benay Spice.  Patient appreciative of call back.

## 2016-05-10 ENCOUNTER — Encounter: Payer: Self-pay | Admitting: *Deleted

## 2016-05-10 ENCOUNTER — Ambulatory Visit (HOSPITAL_BASED_OUTPATIENT_CLINIC_OR_DEPARTMENT_OTHER): Payer: Managed Care, Other (non HMO) | Admitting: Oncology

## 2016-05-10 VITALS — BP 114/78 | HR 76 | Temp 98.6°F | Resp 18 | Ht 64.0 in | Wt 154.9 lb

## 2016-05-10 DIAGNOSIS — C20 Malignant neoplasm of rectum: Secondary | ICD-10-CM

## 2016-05-10 DIAGNOSIS — Z85048 Personal history of other malignant neoplasm of rectum, rectosigmoid junction, and anus: Secondary | ICD-10-CM | POA: Diagnosis not present

## 2016-05-10 NOTE — Progress Notes (Signed)
CT scan report from 05/06/16 faxed to Dr. Philis Fendt at [phone] per request of Dr. Benay Spice.

## 2016-05-10 NOTE — Progress Notes (Signed)
[place] OFFICE PROGRESS NOTE   Diagnosis: Rectal cancer  INTERVAL HISTORY:   [name] returns as scheduled. She continues to have numbness at the perineum. She has frequent bowel movements. She reports intermittent discomfort in the left lower abdomen. She no longer has pain in the left flank. The ureter stent was removed in July. An ultrasound on 10/25/2015 revealed persistent moderate left hydronephrosis. She complains of "tightness "of the hands and fingers.   Objective:  Vital signs in last 24 hours:  Blood pressure 114/78, pulse 76, temperature 98.6 F (37 C), temperature source Oral, resp. rate 18, height _0  (1.626 m), weight 154 lb 14.4 oz (70.3 kg), SpO2 98 %.    HEENT: Neck without mass Lymphatics: No cervical, supra-clavicular, axillary, or inguinal nodes Resp: Lungs clear bilaterally Cardio: Regular rate and rhythm GI: No hepatomegaly, no mass, nontender Vascular: No leg edema   Lab Results: 05/06/2016: BUN 15.7, creatinine 1.0 CEA-4.11   Imaging:  Ct Chest W Contrast  Result Date: 05/06/2016 CLINICAL DATA:  Restaging of rectal cancer. No recent treatments or new surgery. Status post low anterior resection 09/02/2013. EXAM: CT CHEST, ABDOMEN, AND PELVIS WITH CONTRAST TECHNIQUE: Multidetector CT imaging of the chest, abdomen and pelvis was performed following the standard protocol during bolus administration of intravenous contrast. CONTRAST:  125m ISOVUE-300 IOPAMIDOL (ISOVUE-300) INJECTION 61% COMPARISON:  06/16/2015 and clinic note of 11/17/2015. FINDINGS: CT CHEST FINDINGS Cardiovascular: Mildly tortuous descending thoracic aorta. Normal heart size, without pericardial effusion. No central pulmonary embolism, on this non-dedicated study. Mediastinum/Nodes: No supraclavicular adenopathy. No mediastinal or hilar adenopathy. Lungs/Pleura: No pleural fluid. Left greater than right base scarring, similar. No suspicious pulmonary nodule or  mass. Musculoskeletal: No acute osseous abnormality. Surgical changes in the low cervical spine. CT ABDOMEN PELVIS FINDINGS Hepatobiliary: 3 mm right hepatic lobe low-density lesion on image 46/series 2 is unchanged. There may be mild hepatic steatosis. No suspicious liver lesion. There is more focal steatosis adjacent the falciform. Normal gallbladder, without biliary ductal dilatation. Pancreas: Normal, without mass or ductal dilatation. Spleen: Normal in size, without focal abnormality. Adrenals/Urinary Tract: Normal adrenal glands. Normal right kidney. Moderate-to-marked left-sided hydronephrosis is increased. Developing renal cortical thinning. Moderate left hydroureter is similar. Gradual transition to normal caliber distal ureter is identified in the upper pelvis. No cause identified. Normal urinary bladder. Stomach/Bowel: Proximal gastric underdistention. Status post low anterior resection with anastomosis in the rectum. No well-defined locally recurrent disease. Presacral interstitial thickening is similar, and likely treatment related. No well-defined dominant mass. Slight decrease in small volume presacral gas, including on image 96/ series 2. No well-defined fluid collection. There is suggestion of mild enhancement just lateral to the surgical clip, within the right presacral space, on image 101/series 2. This is at the site of gas on the prior exam. The more proximal colon is unremarkable. Normal terminal ileum. Normal small bowel. Vascular/Lymphatic: Normal caliber of the aorta and branch vessels. No retroperitoneal or retrocrural adenopathy. Right common iliac node measures 8 mm on image 97/series 2 versus 10 mm on the prior. No perirectal adenopathy. No adenopathy in the sigmoid mesocolon Reproductive: Intrauterine device.  No adnexal mass. Other: No significant free fluid. No evidence of omental or peritoneal disease. Musculoskeletal: Presacral stimulator remains in place. No evidence of  osteomyelitis. Degenerative disc disease involves L4-5 and L5-S1. IMPRESSION: 1. Status post low anterior resection of rectal carcinoma. Persistent, likely treatment related presacral interstitial thickening. No well-defined locally recurrent disease. 2. Decreased gas within the presacral space,  again suggesting fistulous communication to the rectum. Subtle enhancement is identified in the right presacral space, at the site a gas on the prior exam. This likely relates to chronic inflammation. 3. Resolution of right external iliac adenopathy. 4. Increase in moderate to marked left-sided hydronephrosis with similar hydroureter. No cause identified. Developing left renal cortical thinning is likely secondary to chronic obstruction. 5.  No acute process or evidence of metastatic disease in the chest. Electronically Signed   By: Abigail Miyamoto M.D.   On: 05/06/2016 18:51   Ct Abdomen Pelvis W Contrast  Result Date: 05/06/2016 CLINICAL DATA:  Restaging of rectal cancer. No recent treatments or new surgery. Status post low anterior resection 09/02/2013. EXAM: CT CHEST, ABDOMEN, AND PELVIS WITH CONTRAST TECHNIQUE: Multidetector CT imaging of the chest, abdomen and pelvis was performed following the standard protocol during bolus administration of intravenous contrast. CONTRAST:  147m ISOVUE-300 IOPAMIDOL (ISOVUE-300) INJECTION 61% COMPARISON:  06/16/2015 and clinic note of 11/17/2015. FINDINGS: CT CHEST FINDINGS Cardiovascular: Mildly tortuous descending thoracic aorta. Normal heart size, without pericardial effusion. No central pulmonary embolism, on this non-dedicated study. Mediastinum/Nodes: No supraclavicular adenopathy. No mediastinal or hilar adenopathy. Lungs/Pleura: No pleural fluid. Left greater than right base scarring, similar. No suspicious pulmonary nodule or mass. Musculoskeletal: No acute osseous abnormality. Surgical changes in the low cervical spine. CT ABDOMEN PELVIS FINDINGS Hepatobiliary: 3 mm right  hepatic lobe low-density lesion on image 46/series 2 is unchanged. There may be mild hepatic steatosis. No suspicious liver lesion. There is more focal steatosis adjacent the falciform. Normal gallbladder, without biliary ductal dilatation. Pancreas: Normal, without mass or ductal dilatation. Spleen: Normal in size, without focal abnormality. Adrenals/Urinary Tract: Normal adrenal glands. Normal right kidney. Moderate-to-marked left-sided hydronephrosis is increased. Developing renal cortical thinning. Moderate left hydroureter is similar. Gradual transition to normal caliber distal ureter is identified in the upper pelvis. No cause identified. Normal urinary bladder. Stomach/Bowel: Proximal gastric underdistention. Status post low anterior resection with anastomosis in the rectum. No well-defined locally recurrent disease. Presacral interstitial thickening is similar, and likely treatment related. No well-defined dominant mass. Slight decrease in small volume presacral gas, including on image 96/ series 2. No well-defined fluid collection. There is suggestion of mild enhancement just lateral to the surgical clip, within the right presacral space, on image 101/series 2. This is at the site of gas on the prior exam. The more proximal colon is unremarkable. Normal terminal ileum. Normal small bowel. Vascular/Lymphatic: Normal caliber of the aorta and branch vessels. No retroperitoneal or retrocrural adenopathy. Right common iliac node measures 8 mm on image 97/series 2 versus 10 mm on the prior. No perirectal adenopathy. No adenopathy in the sigmoid mesocolon Reproductive: Intrauterine device.  No adnexal mass. Other: No significant free fluid. No evidence of omental or peritoneal disease. Musculoskeletal: Presacral stimulator remains in place. No evidence of osteomyelitis. Degenerative disc disease involves L4-5 and L5-S1. IMPRESSION: 1. Status post low anterior resection of rectal carcinoma. Persistent, likely  treatment related presacral interstitial thickening. No well-defined locally recurrent disease. 2. Decreased gas within the presacral space, again suggesting fistulous communication to the rectum. Subtle enhancement is identified in the right presacral space, at the site a gas on the prior exam. This likely relates to chronic inflammation. 3. Resolution of right external iliac adenopathy. 4. Increase in moderate to marked left-sided hydronephrosis with similar hydroureter. No cause identified. Developing left renal cortical thinning is likely secondary to chronic obstruction. 5.  No acute process or evidence of metastatic disease in the  chest. Electronically Signed   By: Abigail Miyamoto M.D.   On: 05/06/2016 18:51    Medications: I have reviewed the patient's current medications.  Assessment/Plan: 1. Rectal cancer, clinical stage III (T3 N1) status post biopsy of a rectal mass on 05/24/2013 confirming adenocarcinoma. Endoscopic ultrasound 05/31/2013 measured the mass at 11 cm from the anal verge, uT3uN1.  Initiation of radiation and concurrent Xeloda 06/18/2013; completion 07/26/2013.   Status post low anterior resection 09/02/2013. Invasive adenocarcinoma, 3.7 cm, negative margins; metastatic carcinoma in 7 of 11 lymph nodes; extensive lymph vascular involvement by tumor. MSI-stable, no loss of mismatch repair protein expression  Cycle 1 adjuvant FOLFOX 10/15/2013.   Cycle 2 adjuvant FOLFOX 10/29/2013.   Cycle 3 adjuvant FOLFOX 11/14/2013.   Cycle 4 adjuvant FOLFOX 11/27/2013.   Cycle 5 adjuvant FOLFOX 12/10/2013   Cycle 6 adjuvant FOLFOX 12/24/2013.   Cycle 7 adjuvant FOLFOX 01/14/2014.   Cycle 8 adjuvant FOLFOX 01/28/2014   Cycle 9 adjuvant FOLFOX 02/11/2014 (oxaliplatin dose reduced secondary to thrombocytopenia)   Surveillance CT scans 06/02/2014 with no evidence of recurrent rectal cancer  surveillance CT scans 06/16/2015 with no evidence of recurrent rectal cancer, 10  mm right external iliac node , increased amount of loculated air associated with the presacral soft tissue thickening , new left hydronephrosis  Surveillance CT scans 05/06/2016-negative for recurrent rectal cancer, decreased right external iliac node, persistent left hydronephrosis 2. Post colonoscopy bowel perforation confirmed on abdomen CT 05/28/2013. 3. Single indeterminate liver lesion on the abdomen CT 05/28/2013. MRI on 06/18/2013 confirmed 2 tiny liver lesions, indeterminate-favored to be benign cysts. 4. History of cervical and lumbar disc disease.  5. History of HELLP syndrome with first pregnancy.  6. Delayed nausea following cycle 1 FOLFOX. She receives Aloxi and Emend. She continues to experience delayed nausea. Prophylactic Decadron added with cycle 4. 7. History of pain with bowel movements. 8. Anastomotic leak on CT 09/13/2013. Status post exploratory laparotomy with drainage of a pelvic abscess, diverting loop ileostomy. Drainage catheter removed 10/23/2013.  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 Perineal numbness, urinary retention. The urinary retention has improved. 10. Perineal pain-improved. 11. Neutropenia secondary chemotherapy-she received Neulasta. 12. Urinary tract infection, Escherichia coli-11/03/2013. 13. Venous engorgement at the left anterior chest and left arm with swelling of the left arm and hand. Venous Doppler 11/14/2013 positive for DVT. She began Lovenox 11/14/2013 and was switched to xarelto 12/10/2013. Xarelto discontinued early January 2016. 14. Fever 11/20/2013. Blood and urine cultures negative. The fever resolved within 24 hours. She continued to have a single episode of fever following chemotherapy 15. History of a herpetic lip lesion 16. 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 17. Escherichia coli bacteremia, urinary  source, 01/14/2014. She completed a 10 day course of ceftriaxone. 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 Anxiety/depression. 20. Port-A-Cath removal 06/09/2014. 21. Escherichia coli urinary tract infection 06/24/2014. She completed a course of ciprofloxacin. 22. frequent bowel movements following surgery/radiation -status post placement of a sacral stimulator 11/21/2014 23. CT 06/16/2015 with new left hydronephrosis status post cystoscopy with placement of a double-J stent 07/17/2015. No tumors or stones were seen in the bladder. Both ureteral orifices were unremarkable. On the left side retrograde study was performed. The distal ureter was narrowed and then a little bit more dilated proximally with hydronephrosis noted. There was no evidence of intramural tumor but the area did seem a bit strictured.  Stent removed, persistent hydronephrosis, followed by Dr. Amalia Hailey  CT 05/06/2016-increased left hydronephrosis     Disposition:  [name] remains in clinical remission from rectal cancer. She will return for an office visit and CEA in 6 months. She will see Dr. Shelia Media to discuss the hand symptoms. We will forward the CT report to Dr. Amalia Hailey for continued management of the left hydronephrosis.  Betsy Coder, MD  05/10/2016  1:01 PM

## 2016-05-18 ENCOUNTER — Other Ambulatory Visit (HOSPITAL_COMMUNITY): Payer: Self-pay | Admitting: Urology

## 2016-05-18 DIAGNOSIS — R339 Retention of urine, unspecified: Secondary | ICD-10-CM

## 2016-06-04 ENCOUNTER — Other Ambulatory Visit: Payer: Self-pay | Admitting: Nurse Practitioner

## 2016-09-02 ENCOUNTER — Ambulatory Visit (HOSPITAL_COMMUNITY): Payer: 59

## 2016-09-09 ENCOUNTER — Ambulatory Visit (HOSPITAL_COMMUNITY): Admission: RE | Admit: 2016-09-09 | Payer: 59 | Source: Ambulatory Visit

## 2016-11-08 ENCOUNTER — Ambulatory Visit (HOSPITAL_BASED_OUTPATIENT_CLINIC_OR_DEPARTMENT_OTHER): Payer: 59 | Admitting: Oncology

## 2016-11-08 ENCOUNTER — Other Ambulatory Visit (HOSPITAL_BASED_OUTPATIENT_CLINIC_OR_DEPARTMENT_OTHER): Payer: 59

## 2016-11-08 ENCOUNTER — Telehealth: Payer: Self-pay | Admitting: Oncology

## 2016-11-08 VITALS — BP 138/90 | HR 62 | Temp 98.7°F | Resp 18 | Ht 64.0 in | Wt 155.1 lb

## 2016-11-08 DIAGNOSIS — Z85038 Personal history of other malignant neoplasm of large intestine: Secondary | ICD-10-CM

## 2016-11-08 DIAGNOSIS — C20 Malignant neoplasm of rectum: Secondary | ICD-10-CM

## 2016-11-08 LAB — BASIC METABOLIC PANEL
ANION GAP: 9 meq/L (ref 3–11)
BUN: 23 mg/dL (ref 7.0–26.0)
CO2: 26 mEq/L (ref 22–29)
CREATININE: 1 mg/dL (ref 0.6–1.1)
Calcium: 9.6 mg/dL (ref 8.4–10.4)
Chloride: 103 mEq/L (ref 98–109)
EGFR: 67 mL/min/{1.73_m2} — AB (ref >90)
Glucose: 90 mg/dl (ref 70–140)
POTASSIUM: 4.2 meq/L (ref 3.5–5.1)
Sodium: 138 mEq/L (ref 136–145)

## 2016-11-08 LAB — CEA (IN HOUSE-CHCC): CEA (CHCC-IN HOUSE): 3.55 ng/mL (ref 0.00–5.00)

## 2016-11-08 NOTE — Telephone Encounter (Signed)
Appointments scheduled per 11/08/16 los. Patient was given a copy of the AVS report and appointment schedule per 11/08/16 los.

## 2016-11-08 NOTE — Progress Notes (Signed)
[facility] OFFICE PROGRESS NOTE   Diagnosis: Rectal cancer  INTERVAL HISTORY:   [name] returns as scheduled. She feels well. She recently returned from a cruise. She underwent right knee surgery for a torn meniscus approximately 1 month ago. She continues to have pain in the right knee. She has persistent rectal urgency and frequency. She reports having 10-15 bowel movements per day. She had a recent urinary tract infection. She continues follow-up with Dr. Amalia Hailey for management of left hydronephrosis. A renal ultrasound 09/09/2016 revealed no change in left hydronephrosis compared to an ultrasound from August 2017.  Objective:  Vital signs in last 24 hours:  Blood pressure 138/90, pulse 62, temperature 98.7 F (37.1 C), temperature source Oral, resp. rate 18, height 5' 4"  (1.626 m), weight 155 lb 1.6 oz (70.4 kg), SpO2 98 %.    HEENT: Neck without mass Lymphatics: No cervical, supraclavicular, axillary, or inguinal nodes Resp: Lungs clear bilaterally Cardio: Regular rate and rhythm GI: No hepatosplenomegaly, no mass And nontender Vascular: No leg edema, mild edema and venous engorgement of the left arm     Lab Results:   Lab Results  Component Value Date   CEA1 4.11 05/06/2016   CEA1 3.7 05/06/2016    Medications: I have reviewed the patient's current medications.  Assessment/Plan: 1. Rectal cancer, clinical stage III (T3 N1) status post biopsy of a rectal mass on 05/24/2013 confirming adenocarcinoma. Endoscopic ultrasound 05/31/2013 measured the mass at 11 cm from the anal verge, uT3uN1.  Initiation of radiation and concurrent Xeloda 06/18/2013; completion 07/26/2013.   Status post low anterior resection 09/02/2013. Invasive adenocarcinoma, 3.7 cm, negative margins; metastatic carcinoma in 7 of 11 lymph nodes; extensive lymph vascular involvement by tumor.MSI-stable, no loss of mismatch repair protein expression  Cycle 1 adjuvant FOLFOX  10/15/2013.   Cycle 2 adjuvant FOLFOX 10/29/2013.   Cycle 3 adjuvant FOLFOX 11/14/2013.   Cycle 4 adjuvant FOLFOX 11/27/2013.   Cycle 5 adjuvant FOLFOX 12/10/2013   Cycle 6 adjuvant FOLFOX 12/24/2013.   Cycle 7 adjuvant FOLFOX 01/14/2014.   Cycle 8 adjuvant FOLFOX 01/28/2014   Cycle 9 adjuvant FOLFOX 02/11/2014 (oxaliplatin dose reduced secondary to thrombocytopenia)   Surveillance CT scans 06/02/2014 with no evidence of recurrent rectal cancer  surveillance CT scans 06/16/2015 with no evidence of recurrent rectal cancer, 10 mm right external iliac node , increased amount of loculated air associated with the presacral soft tissue thickening , new left hydronephrosis  Surveillance CT scans 05/06/2016-negative for recurrent rectal cancer, decreased right external iliac node, persistent left hydronephrosis 2. Post colonoscopy bowel perforation confirmed on abdomen CT 05/28/2013. 3. Single indeterminate liver lesion on the abdomen CT 05/28/2013. MRI on 06/18/2013 confirmed 2 tiny liver lesions, indeterminate-favored to be benign cysts. 4. History of cervical and lumbar disc disease.  5. History of HELLP syndrome with first pregnancy.  6. Delayed nausea following cycle 1 FOLFOX. She receives Aloxi and Emend. She continues to experience delayed nausea. Prophylactic Decadron added with cycle 4. 7. History of pain with bowel movements. 8. Anastomotic leak on CT 09/13/2013. Status post exploratory laparotomy with drainage of a pelvic abscess, diverting loop ileostomy. Drainage catheter removed 10/23/2013.  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 Perineal numbness, urinary retention. The urinary retention has improved. 10. Perineal pain-improved. 11. Neutropenia secondary chemotherapy-she received Neulasta. 12. Urinary tract infection, Escherichia  coli-11/03/2013. 13. Venous engorgement at the left anterior chest and left  arm with swelling of the left arm and hand. Venous Doppler 11/14/2013 positive for DVT. She began Lovenox 11/14/2013 and was switched to xarelto 12/10/2013. Xarelto discontinued early January 2016. 14. Fever 11/20/2013. Blood and urine cultures negative. The fever resolved within 24 hours. She continued to have a single episode of fever following chemotherapy 15. History of a herpetic lip lesion 16. 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 17. Escherichia coli bacteremia, urinary source, 01/14/2014. She completed a 10 day course of ceftriaxone. 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 Anxiety/depression. 20. Port-A-Cath removal 06/09/2014. 21. Escherichia coli urinary tract infection 06/24/2014. She completed a course of ciprofloxacin. 22. frequent bowel movements following surgery/radiation -status post placement of a sacral stimulator 11/21/2014 23. CT 06/16/2015 with new left hydronephrosis status post cystoscopy with placement of a double-J stent 07/17/2015. No tumors or stones were seen in the bladder. Both ureteral orifices were unremarkable. On the left side retrograde study was performed. The distal ureter was narrowed and then a little bit more dilated proximally with hydronephrosis noted. There was no evidence of intramural tumor but the area did seem a bit strictured.  Stent removed, persistent hydronephrosis, followed by Dr. Amalia Hailey  CT 05/06/2016-increased left hydronephrosis  Ultrasound 09/09/2016-stable left hydroureteronephrosis   Disposition:  She remains in clinical remission from rectal cancer. She will continue follow-up with Dr. Amalia Hailey for management of the left hydronephrosis. We will follow-up on the CEA from today. She will return for an office visit and CEA in 6 months. She will continue surveillance colonoscopies with Dr. Earlean Shawl  15 minutes  were spent with the patient today. The majority of the time was used for counseling and coordination of care.  Betsy Coder, MD  11/08/2016  1:21 PM

## 2016-11-09 ENCOUNTER — Telehealth: Payer: Self-pay | Admitting: *Deleted

## 2016-11-09 LAB — CEA: CEA1: 3.6 ng/mL (ref 0.0–4.7)

## 2016-11-09 NOTE — Telephone Encounter (Signed)
-----   Message from Ladell Pier, MD sent at 11/09/2016  4:11 PM EDT ----- Please call patient, [name] is normal

## 2016-11-09 NOTE — Telephone Encounter (Signed)
Patient notified per order of Dr. Benay Spice that cea is normal.  Patient appreciative of call and has no questions at this time.

## 2016-12-02 ENCOUNTER — Other Ambulatory Visit: Payer: Self-pay | Admitting: *Deleted

## 2016-12-02 DIAGNOSIS — C20 Malignant neoplasm of rectum: Secondary | ICD-10-CM

## 2016-12-02 MED ORDER — ACYCLOVIR 400 MG PO TABS: 400.0000 mg | ORAL_TABLET | Freq: Two times a day (BID) | ORAL | 1 refills | Status: DC | PRN

## 2017-05-16 ENCOUNTER — Other Ambulatory Visit (HOSPITAL_BASED_OUTPATIENT_CLINIC_OR_DEPARTMENT_OTHER): Payer: Managed Care, Other (non HMO)

## 2017-05-16 ENCOUNTER — Telehealth: Payer: Self-pay | Admitting: Oncology

## 2017-05-16 ENCOUNTER — Ambulatory Visit (HOSPITAL_BASED_OUTPATIENT_CLINIC_OR_DEPARTMENT_OTHER): Payer: Managed Care, Other (non HMO) | Admitting: Oncology

## 2017-05-16 VITALS — BP 116/79 | HR 75 | Temp 98.7°F | Resp 20 | Wt 157.5 lb

## 2017-05-16 DIAGNOSIS — Z85048 Personal history of other malignant neoplasm of rectum, rectosigmoid junction, and anus: Secondary | ICD-10-CM | POA: Diagnosis not present

## 2017-05-16 DIAGNOSIS — C20 Malignant neoplasm of rectum: Secondary | ICD-10-CM

## 2017-05-16 LAB — CEA (IN HOUSE-CHCC): CEA (CHCC-IN HOUSE): 3.91 ng/mL (ref 0.00–5.00)

## 2017-05-16 NOTE — Progress Notes (Signed)
[facility] OFFICE PROGRESS NOTE   Diagnosis: Rectal cancer  INTERVAL HISTORY:   [name] returns as scheduled.  She is recovering from a left nephrectomy Levan 02/2017 performed secondary to a left ureteral stricture and poor functioning of the left kidney.  She continues to have frequent bowel movements and numbness at the perineum.  She reports intermittent right abdominal discomfort near the ileostomy scar.  No consistent pain.  Objective:  Vital signs in last 24 hours:  Blood pressure 116/79, pulse 75, temperature 98.7 F (37.1 C), temperature source Oral, resp. rate 20, weight 157 lb 8 oz (71.4 kg), SpO2 99 %.    HEENT: Neck without mass Lymphatics: No cervical, supraclavicular, axillary, or inguinal nodes Resp: Lungs clear bilaterally Cardio: Regular rate and rhythm GI: No hepatomegaly, no mass, nontender Vascular: No leg edema  Skin: Healing nephrectomy scar at the left lateral abdomen with mild surrounding induration at the middle aspect of the scar    Lab Results:    Lab Results  Component Value Date   CEA1 3.91 05/16/2017     Medications: I have reviewed the patient's current medications.  Assessment/Plan: 1. Rectal cancer, clinical stage III (T3 N1) status post biopsy of a rectal mass on 05/24/2013 confirming adenocarcinoma. Endoscopic ultrasound 05/31/2013 measured the mass at 11 cm from the anal verge, uT3uN1.  Initiation of radiation and concurrent Xeloda 06/18/2013; completion 07/26/2013.   Status post low anterior resection 09/02/2013. Invasive adenocarcinoma, 3.7 cm, negative margins; metastatic carcinoma in 7 of 11 lymph nodes; extensive lymph vascular involvement by tumor.MSI-stable, no loss of mismatch repair protein expression  Cycle 1 adjuvant FOLFOX 10/15/2013.   Cycle 2 adjuvant FOLFOX 10/29/2013.   Cycle 3 adjuvant FOLFOX 11/14/2013.   Cycle 4 adjuvant FOLFOX 11/27/2013.   Cycle 5 adjuvant FOLFOX 12/10/2013    Cycle 6 adjuvant FOLFOX 12/24/2013.   Cycle 7 adjuvant FOLFOX 01/14/2014.   Cycle 8 adjuvant FOLFOX 01/28/2014   Cycle 9 adjuvant FOLFOX 02/11/2014 (oxaliplatin dose reduced secondary to thrombocytopenia)   Surveillance CT scans 06/02/2014 with no evidence of recurrent rectal cancer  surveillance CT scans 06/16/2015 with no evidence of recurrent rectal cancer, 10 mm right external iliac node , increased amount of loculated air associated with the presacral soft tissue thickening , new left hydronephrosis  Surveillance CT scans 05/06/2016-negative for recurrent rectal cancer, decreased right external iliac node, persistent left hydronephrosis 2. Post colonoscopy bowel perforation confirmed on abdomen CT 05/28/2013. 3. Single indeterminate liver lesion on the abdomen CT 05/28/2013. MRI on 06/18/2013 confirmed 2 tiny liver lesions, indeterminate-favored to be benign cysts. 4. History of cervical and lumbar disc disease.  5. History of HELLP syndrome with first pregnancy.  6. Delayed nausea following cycle 1 FOLFOX. She receives Aloxi and Emend. She continues to experience delayed nausea. Prophylactic Decadron added with cycle 4. 7. History of pain with bowel movements. 8. Anastomotic leak on CT 09/13/2013. Status post exploratory laparotomy with drainage of a pelvic abscess, diverting loop ileostomy. Drainage catheter removed 10/23/2013.  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 Perineal numbness, urinary retention. The urinary retention has improved. 10. Perineal pain-improved. 11. Neutropenia secondary chemotherapy-she received Neulasta. 12. Urinary tract infection, Escherichia coli-11/03/2013. 13. Venous engorgement at the left anterior chest and left arm with swelling of the left arm and hand. Venous Doppler 11/14/2013 positive for DVT. She began Lovenox 11/14/2013 and  was switched to xarelto 12/10/2013. Xarelto discontinued  early January 2016. 14. Fever 11/20/2013. Blood and urine cultures negative. The fever resolved within 24 hours. She continued to have a single episode of fever following chemotherapy 15. History of a herpetic lip lesion 16. 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 17. Escherichia coli bacteremia, urinary source, 01/14/2014. She completed a 10 day course of ceftriaxone. 4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 Anxiety/depression. 20. Port-A-Cath removal 06/09/2014. 21. Escherichia coli urinary tract infection 06/24/2014. She completed a course of ciprofloxacin. 22. frequent bowel movements following surgery/radiation -status post placement of a sacral stimulator 11/21/2014 23. CT 06/16/2015 with new left hydronephrosis status post cystoscopy with placement of a double-J stent 07/17/2015. No tumors or stones were seen in the bladder. Both ureteral orifices were unremarkable. On the left side retrograde study was performed. The distal ureter was narrowed and then a little bit more dilated proximally with hydronephrosis noted. There was no evidence of intramural tumor but the area did seem a bit strictured.  Stent removed, persistent hydronephrosis, followed by Dr. Amalia Hailey  CT 05/06/2016-increased left hydronephrosis  Ultrasound 09/09/2016-stable left hydroureteronephrosis  Left nephrectomy 04/28/2017   Disposition:  [name] remains in clinical remission from rectal cancer.  She will return for an office visit and CEA in 6 months.  We will follow-up on the most recent surveillance colonoscopy by Dr. Earlean Shawl.  She will contact us for consistent abdominal pain.  15 minutes were spent with the patient today.  The majority of the time was used for counseling and coordination of care.  Betsy Coder, MD  05/16/2017  12:20 PM

## 2017-05-16 NOTE — Telephone Encounter (Signed)
Spoke with patient re June lab/fu.

## 2017-07-04 ENCOUNTER — Encounter (HOSPITAL_COMMUNITY): Payer: Self-pay | Admitting: Emergency Medicine

## 2017-07-04 ENCOUNTER — Emergency Department (HOSPITAL_COMMUNITY)
Admission: EM | Admit: 2017-07-04 | Discharge: 2017-07-05 | Disposition: A | Discharge status: Home / Self Care | Payer: 59 | Attending: Emergency Medicine | Admitting: Emergency Medicine

## 2017-07-04 DIAGNOSIS — R198 Other specified symptoms and signs involving the digestive system and abdomen: Secondary | ICD-10-CM | POA: Insufficient documentation

## 2017-07-04 DIAGNOSIS — R1900 Intra-abdominal and pelvic swelling, mass and lump, unspecified site: Secondary | ICD-10-CM | POA: Insufficient documentation

## 2017-07-04 DIAGNOSIS — I10 Essential (primary) hypertension: Secondary | ICD-10-CM | POA: Diagnosis not present

## 2017-07-04 DIAGNOSIS — Z85048 Personal history of other malignant neoplasm of rectum, rectosigmoid junction, and anus: Secondary | ICD-10-CM | POA: Insufficient documentation

## 2017-07-04 DIAGNOSIS — Z85038 Personal history of other malignant neoplasm of large intestine: Secondary | ICD-10-CM | POA: Diagnosis not present

## 2017-07-04 DIAGNOSIS — Z79899 Other long term (current) drug therapy: Secondary | ICD-10-CM | POA: Insufficient documentation

## 2017-07-04 DIAGNOSIS — R591 Generalized enlarged lymph nodes: Secondary | ICD-10-CM | POA: Diagnosis not present

## 2017-07-04 DIAGNOSIS — R3916 Straining to void: Secondary | ICD-10-CM | POA: Diagnosis not present

## 2017-07-04 LAB — URINALYSIS, ROUTINE W REFLEX MICROSCOPIC
BACTERIA UA: NONE SEEN
BILIRUBIN URINE: NEGATIVE
Glucose, UA: NEGATIVE mg/dL
HGB URINE DIPSTICK: NEGATIVE
KETONES UR: NEGATIVE mg/dL
NITRITE: NEGATIVE
PROTEIN: NEGATIVE mg/dL
SPECIFIC GRAVITY, URINE: 1.01 (ref 1.005–1.030)
pH: 5 (ref 5.0–8.0)

## 2017-07-04 LAB — CBC WITH DIFFERENTIAL/PLATELET
BASOS PCT: 0 %
Basophils Absolute: 0 10*3/uL (ref 0.0–0.1)
EOS ABS: 0.1 10*3/uL (ref 0.0–0.7)
Eosinophils Relative: 2 %
HCT: 38.8 % (ref 36.0–46.0)
Hemoglobin: 13.1 g/dL (ref 12.0–15.0)
Lymphocytes Relative: 13 %
Lymphs Abs: 0.8 10*3/uL (ref 0.7–4.0)
MCH: 30 pg (ref 26.0–34.0)
MCHC: 33.8 g/dL (ref 30.0–36.0)
MCV: 89 fL (ref 78.0–100.0)
Monocytes Absolute: 0.5 10*3/uL (ref 0.1–1.0)
Monocytes Relative: 8 %
Neutro Abs: 5 10*3/uL (ref 1.7–7.7)
Neutrophils Relative %: 77 %
PLATELETS: 259 10*3/uL (ref 150–400)
RBC: 4.36 MIL/uL (ref 3.87–5.11)
RDW: 13.2 % (ref 11.5–15.5)
WBC: 6.5 10*3/uL (ref 4.0–10.5)

## 2017-07-04 LAB — COMPREHENSIVE METABOLIC PANEL
ALK PHOS: 63 U/L (ref 38–126)
ALT: 13 U/L — ABNORMAL LOW (ref 14–54)
ANION GAP: 10 (ref 5–15)
AST: 18 U/L (ref 15–41)
Albumin: 3.9 g/dL (ref 3.5–5.0)
BUN: 26 mg/dL — ABNORMAL HIGH (ref 6–20)
CALCIUM: 9.4 mg/dL (ref 8.9–10.3)
CHLORIDE: 102 mmol/L (ref 101–111)
CO2: 26 mmol/L (ref 22–32)
Creatinine, Ser: 0.9 mg/dL (ref 0.44–1.00)
GFR calc Af Amer: >60 mL/min (ref >60)
GFR calc non Af Amer: >60 mL/min (ref >60)
Glucose, Bld: 119 mg/dL — ABNORMAL HIGH (ref 65–99)
Potassium: 3.1 mmol/L — ABNORMAL LOW (ref 3.5–5.1)
SODIUM: 138 mmol/L (ref 135–145)
Total Bilirubin: 0.2 mg/dL — ABNORMAL LOW (ref 0.3–1.2)
Total Protein: 7.6 g/dL (ref 6.5–8.1)

## 2017-07-04 LAB — LIPASE, BLOOD: Lipase: 35 U/L (ref 11–51)

## 2017-07-04 MED ORDER — POTASSIUM CHLORIDE CRYS ER 20 MEQ PO TBCR
40.0000 meq | EXTENDED_RELEASE_TABLET | Freq: Once | ORAL | Status: AC
  Administered 2017-07-04: 40 meq via ORAL
  Filled 2017-07-04: qty 2

## 2017-07-04 NOTE — ED Notes (Signed)
PA Rob at bedside 

## 2017-07-04 NOTE — ED Provider Notes (Signed)
Patient placed in Quick Look pathway, seen and evaluated   Chief Complaint: swelling to nephrectomy surgical site   HPI: h/o rectal cancer s/p radiation and resection in remission for 3 years and left ureteral stricture leading to decline in left kidney function. Had left radical nephrectomy 8 weeks ago by Dr. Amalia Hailey, uncomplicated until today. Noticed sudden onset, non tender swelling to nephrectomy surgical site.   ROS: swelling to left lateral mid abdomen   Physical Exam:   Gen: No distress  Neuro: Awake and Alert  Skin: Warm    Focused Exam: VS WNL. Firm focal edema to nephrectomy surgical scar w/o significant erythema, warmth, fluctuance, ecchymosis to abdomen/flank. Abdomen soft otherwise NTND, no suprapubic or CVAT.   Initiation of care has begun. The patient has been counseled on the process, plan, and necessity for staying for the completion/evaluation, and the remainder of the medical screening examination. Will need creatinine clearance before CT AP.     Kinnie Feil, PA-C 07/04/17 2014    Sherwood Gambler, MD 07/04/17 [phone]

## 2017-07-04 NOTE — ED Triage Notes (Signed)
Patient presents ambulatory with left sided abdominal swelling. Pt has incision scar from left kidney removal from 8 weeks ago. States she noticed the swelling start about 20 mins ago.

## 2017-07-04 NOTE — ED Notes (Signed)
RN informed RN Claiborne Billings to take patient into triage room to be assessed due to medical history.

## 2017-07-04 NOTE — ED Provider Notes (Signed)
[facility] DEPT Provider Note   CSN: [id number] Arrival date & time: 07/04/17  1907     History   Chief Complaint Chief Complaint  Patient presents with  . Abdominal Swelling    HPI [name] is a 57 y.o. female.  Patient presents to the ED with a chief complaint of abdominal swelling.  She is s/p 8 weeks left radical nephrectomy by Dr. Amalia Hailey at [facility] 2/2 persistent ureteral stricture.  She states that she had been doing fine until today she noticed a bulge from the left flank.  She denies having any pain, fever, nausea, or vomiting.  She reports that she does have to strain to urinate and have a BM, but this is not new for her.  She reports that she had colon cancer and has had some loss of sensation to bowel and bladder since having that surgery.  She denies any other associated issues.   The history is provided by the patient. No language interpreter was used.    Past Medical History:  Diagnosis Date  . Anxiety   . Arthritis 08-27-13   at present has ruptured disc- lower back-not an issue now  . Blood infection ([place])    E Coli after UTI   . Colon cancer ([place]) 08-27-13   radiation /chemo -last ended 4 weeks(Dr. Benay Spice)  . DVT (deep venous thrombosis) ([place])    left upper extremity currently has stopped xarelto   . History of radiation therapy 06/18/13-07/26/13   rectal 50.4Gy total dose  . Hypertension    toxemia with pregnancy 1990  . IUD (intrauterine device) in place 08-27-13   Mirena Implant inplace  . Nerve damage    numbness in pelvic area from rectal rection straining to empty bladder   . PONV (postoperative nausea and vomiting)    scopalamine patch helped  . Stress incontinence   . UTI (lower urinary tract infection)    hx of     Patient Active Problem List   Diagnosis Date Noted  . History of ileostomy 05/02/2014  . DVT (deep venous thrombosis) ([place]) 01/14/2014  . Urinary tract infection 01/13/2014  . Large bowel  perforation ([place]) 09/13/2013  . Rectal cancer, 12 - 15 cm from anal verge 05/29/2013  . Herniation of cervical intervertebral disc with radiculopathy 01/03/2012    Past Surgical History:  Procedure Laterality Date  . APPENDECTOMY    . BACK SURGERY    . bladder study      pressure readings had approx 1.5 months ago   . BREAST SURGERY     reduction surgery  . CERVICAL DISC ARTHROPLASTY  01/03/2012   Procedure: CERVICAL ANTERIOR DISC ARTHROPLASTY;  Surgeon: Charlie Pitter, MD;  Location: [facility];  Service: Neurosurgery;  Laterality: N/A;  Cervical Anterior Disc Arthorplasty Five-Six  . CESAREAN SECTION     x2  . EUS N/A 05/31/2013   Procedure: LOWER ENDOSCOPIC ULTRASOUND (EUS);  Surgeon: Beryle Beams, MD;  Location: [facility];  Service: Endoscopy;  Laterality: N/A;  . ILEO LOOP COLOSTOMY CLOSURE N/A 05/02/2014   Procedure: LAPAROSCOPIC ASSISTED REVERSAL ILEOSTOMY AND LYSIS OF ADHESIONS;  Surgeon: [name], MD;  Location: [facility];  Service: General;  Laterality: N/A;  . INTRAUTERINE DEVICE INSERTION  2006   [place]  . KNEE ARTHROSCOPY Left 08-27-13   torn meniscus repair left   . LAPAROSCOPIC LOW ANTERIOR RESECTION N/A 09/02/2013   Procedure: LAPAROSCOPIC LOW ANTERIOR RESECTION;  Surgeon: [name], MD;  Location:  [facility] ORS;  Service: General;  Laterality: N/A;  . LAPAROTOMY N/A 09/13/2013   Procedure: EXPLORATORY LAPAROTOMY / FLEXIBLE SIGMOIDOSCOPY/  DRAINAGE OF PELVIC ABSCESS WITH  DIVERTING  LOOP  ILEOSTOMY;  Surgeon: [name], MD;  Location: [facility];  Service: General;  Laterality: N/A;  . LYSIS OF ADHESION  05/02/2014   Procedure: LYSIS OF ADHESION;  Surgeon: [name], MD;  Location: [facility];  Service: General;;  . PORT-A-CATH REMOVAL Left 06/09/2014   Procedure: REMOVAL PORT-A-CATH  (MINOR PROCEDURE) ;  Surgeon: [name], MD;  Location: [facility];  Service: General;  Laterality: Left;  . PORTACATH PLACEMENT Left 10/11/2013   Procedure: INSERTION  PORT-A-CATH;  Surgeon: [name], MD;  Location: [facility];  Service: General;  Laterality: Left;  . REFRACTIVE SURGERY      OB History    No data available       Home Medications    Prior to Admission medications   Medication Sig Start Date End Date Taking? Authorizing Provider  acyclovir (ZOVIRAX) 400 MG tablet Take 1 tablet (400 mg total) by mouth 2 (two) times daily as needed. 12/02/16   Ladell Pier, MD  hydrochlorothiazide (HYDRODIURIL) 25 MG tablet Take 25 mg by mouth daily. 04/17/14   [provider]  ibuprofen (ADVIL,MOTRIN) 400 MG tablet Take 400 mg by mouth every 6 (six) hours as needed.    [provider]  MINIVELLE 0.075 MG/24HR  10/23/15   [provider]  naproxen (NAPROXEN DR) 500 MG EC tablet Take 500 mg by mouth 2 (two) times daily with a meal.    [provider]  Pancrelipase, Lip-Prot-Amyl, (ZENPEP) 40000 units CPEP Reported on 11/17/2015 11/21/14   [provider]  progesterone (PROMETRIUM) 100 MG capsule Take 100 mg by mouth at bedtime.  10/29/15   [provider]  sertraline (ZOLOFT) 100 MG tablet Take 100 mg by mouth daily. Takes 150 mg daily 06/09/15   [provider]    Family History No family history on file.  Social History Social History   Tobacco Use  . Smoking status: Never Smoker  . Smokeless tobacco: Never Used  Substance Use Topics  . Alcohol use: Yes    Comment: weekends   . Drug use: No     Allergies   Patient has no known allergies.   Review of Systems Review of Systems  All other systems reviewed and are negative.    Physical Exam Updated Vital Signs BP 109/75 (BP Location: Right Arm)   Pulse 78   Temp 98 F (36.7 C) (Oral)   Resp 16   SpO2 96%   Physical Exam  Constitutional: She is oriented to person, place, and time. She appears well-developed and well-nourished.  HENT:  Head: Normocephalic and atraumatic.  Eyes: Conjunctivae and EOM are normal. Pupils  are equal, round, and reactive to light.  Neck: Normal range of motion. Neck supple.  Cardiovascular: Normal rate and regular rhythm. Exam reveals no gallop and no friction rub.  No murmur heard. Pulmonary/Chest: Effort normal and breath sounds normal. No respiratory distress. She has no wheezes. She has no rales. She exhibits no tenderness.  Abdominal: Soft. Bowel sounds are normal. She exhibits no distension and no mass. There is no tenderness. There is no rebound and no guarding.  Soft round mass underlying the left flank  Musculoskeletal: Normal range of motion. She exhibits no edema or tenderness.  Neurological: She is alert and oriented to person, place, and  time.  Skin: Skin is warm and dry.  Psychiatric: She has a normal mood and affect. Her behavior is normal. Judgment and thought content normal.  Nursing note and vitals reviewed.    ED Treatments / Results  Labs (all labs ordered are listed, but only abnormal results are displayed) Labs Reviewed  URINALYSIS, ROUTINE W REFLEX MICROSCOPIC - Abnormal; Notable for the following components:      Result Value   Leukocytes, UA SMALL (*)    Squamous Epithelial / LPF 0-5 (*)    All other components within normal limits  CBC WITH DIFFERENTIAL/PLATELET  COMPREHENSIVE METABOLIC PANEL  LIPASE, BLOOD    EKG  EKG Interpretation None       Radiology Ct Abdomen Pelvis W Contrast  Result Date: 07/05/2017 CLINICAL DATA:  Abdominal distension. History colon cancer status post LAR 09/02/2013 and radiation therapy. History of appendectomy. Reported recent left nephrectomy related to chronic left ureteral obstruction. EXAM: CT ABDOMEN AND PELVIS WITH CONTRAST TECHNIQUE: Multidetector CT imaging of the abdomen and pelvis was performed using the standard protocol following bolus administration of intravenous contrast. CONTRAST:  128mL ISOVUE-300 IOPAMIDOL (ISOVUE-300) INJECTION 61% COMPARISON:  05/06/2016 CT abdomen/pelvis. FINDINGS: Lower  chest: No significant pulmonary nodules or acute consolidative airspace disease. Hepatobiliary: Normal liver size. Subcentimeter hypodense anterior right liver lobe lesion is too small to characterize and stable since 05/06/2016 CT, considered benign. No new liver lesions. Normal gallbladder with no radiopaque cholelithiasis. No biliary ductal dilatation. Pancreas: Normal, with no mass or duct dilation. Spleen: Normal size. No mass. Adrenals/Urinary Tract: Normal adrenals. Interval left nephrectomy. No mass or fluid collection in the left nephrectomy bed. Normal right kidney with no right hydronephrosis and no right renal mass. Normal bladder. Stomach/Bowel: Normal non-distended stomach. Normal caliber small bowel with no small bowel wall thickening. Appendectomy. Stable postsurgical changes from low anterior resection and colorectal anastomosis. No acute large bowel wall thickening or pericolonic fat stranding. Stable chronic presacral soft tissue density. 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 Vascular/Lymphatic: Normal caliber abdominal aorta. Patent portal, splenic, hepatic and right renal veins. Enlarged 1.6 cm aortocaval node (series 2/image 28), new since 05/06/2016. Clustered enlarged left para-aortic nodes measuring up to 1.8 cm (series 2/image 26), new. Newly enlarged heterogeneous 1.6 cm right external iliac node (series 2/image 62). Newly enlarged 1.1 cm right common iliac node (series 2/image 49). Reproductive: Intrauterine device is grossly well-positioned in the uterine cavity. No adnexal mass. Other: No pneumoperitoneum, ascites or focal fluid collection. Fat stranding in the midline ventral abdominal wall from prior laparotomy. No superficial fluid collections or masses. No evidence of a ventral abdominal hernia. Musculoskeletal: No aggressive appearing focal osseous lesions. Marked thoracolumbar spondylosis. Left lower back  spinal stimulator device with lead terminating in left lower presacral space. IMPRESSION: 1. New left para-aortic, aortocaval, right common iliac and right external iliac lymphadenopathy, worrisome for metastatic disease. 2. Interval left nephrectomy with no mass or fluid collection in the left nephrectomy bed. 3. No evidence of a ventral abdominal hernia. No superficial fluid collections. 4. 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 Stable nonspecific soft tissue density in the presacral space, probably treatment related. Electronically Signed   By: Ilona Sorrel M.D.   On: 07/05/2017 01:00    Procedures Procedures (including critical care time)  Medications Ordered in ED Medications - No data to display   Initial Impression /  Assessment and Plan / ED Course  I have reviewed the triage vital signs and the nursing notes.  Pertinent labs & imaging results that were available during my care of the patient were reviewed by me and considered in my medical decision making (see chart for details).    Patient with left abdominal swelling.  Recent hx of nephrectomy.  I am suspicious of hernia.  No pain.  VSS.  Well appearing.  Will check CT given recent complex history.  Ct negative regarding the abdominal swelling or bulge in the left flank.  No TTP.  VSS.    CT does show lymphadenopathy concerning for metastatic disease.  I discussed this with the patient.  She will follow-up with her oncologist tomorrow.   Final Clinical Impressions(s) / ED Diagnoses   Final diagnoses:  Abdominal swelling  Lymphadenopathy    ED Discharge Orders    None       Montine Circle, PA-C 07/05/17 [place], MD 07/06/17 [phone]

## 2017-07-05 ENCOUNTER — Telehealth: Payer: Self-pay | Admitting: Emergency Medicine

## 2017-07-05 ENCOUNTER — Emergency Department (HOSPITAL_COMMUNITY): Payer: 59

## 2017-07-05 MED ORDER — IOPAMIDOL (ISOVUE-300) INJECTION 61%
INTRAVENOUS | Status: AC
  Filled 2017-07-05: qty 100

## 2017-07-05 MED ORDER — IOPAMIDOL (ISOVUE-300) INJECTION 61%
100.0000 mL | Freq: Once | INTRAVENOUS | Status: AC | PRN
  Administered 2017-07-05: 100 mL via INTRAVENOUS

## 2017-07-05 NOTE — ED Notes (Signed)
ED Provider at bedside. 

## 2017-07-05 NOTE — Discharge Instructions (Signed)
You CT scan shows no worrisome findings in your left flank.  It does show some swollen lymph nodes, which are worrisome given you cancer history.  It is advised that you contact your oncologist tomorrow.    You will need to follow-up with your surgeon regarding the swelling.  No obvious infection, hernia, or emergent finding was found in this regard.

## 2017-07-05 NOTE — Telephone Encounter (Signed)
Appt date and time given to pt. Pt verbalized understanding.

## 2017-07-07 ENCOUNTER — Inpatient Hospital Stay: Payer: Managed Care, Other (non HMO) | Attending: Nurse Practitioner | Admitting: Nurse Practitioner

## 2017-07-07 ENCOUNTER — Encounter: Payer: Self-pay | Admitting: Nurse Practitioner

## 2017-07-07 ENCOUNTER — Telehealth: Payer: Self-pay | Admitting: Nurse Practitioner

## 2017-07-07 VITALS — BP 122/77 | HR 85 | Temp 98.0°F | Resp 18 | Ht 64.0 in | Wt 156.7 lb

## 2017-07-07 DIAGNOSIS — Z85048 Personal history of other malignant neoplasm of rectum, rectosigmoid junction, and anus: Secondary | ICD-10-CM

## 2017-07-07 DIAGNOSIS — R1909 Other intra-abdominal and pelvic swelling, mass and lump: Secondary | ICD-10-CM | POA: Diagnosis present

## 2017-07-07 DIAGNOSIS — C20 Malignant neoplasm of rectum: Secondary | ICD-10-CM

## 2017-07-07 DIAGNOSIS — R109 Unspecified abdominal pain: Secondary | ICD-10-CM

## 2017-07-07 NOTE — Progress Notes (Addendum)
[facility] OFFICE PROGRESS NOTE   Diagnosis: Rectal cancer  INTERVAL HISTORY:   Ms. Revels returns prior to scheduled follow-up.  She was seen in the emergency department 07/04/2017 for evaluation of abdominal swelling.  CT scan showed new left para-aortic, aortocaval, right common iliac and right external iliac lymphadenopathy.  Bowel habits lately have been somewhat erratic.  Some discomfort with bowel movements.  No bleeding.  She notes swelling and discomfort at the left abdomen.  No weight loss.  She has had recent pain at the left low neck.  Objective:  Vital signs in last 24 hours:  Blood pressure 122/77, pulse 85, temperature 98 F (36.7 C), temperature source Oral, resp. rate 18, height 5' 4"  (1.626 m), weight 156 lb 11.2 oz (71.1 kg), SpO2 98 %.    HEENT: No thrush or ulcers. Lymphatics: Masslike firmness left medial supraclavicular region.  No palpable cervical, right supraclavicular, axillary or inguinal lymph nodes. Resp: Lungs clear bilaterally. Cardio: Regular rate and rhythm. GI: No hepatomegaly.  Soft fullness left abdomen in the region of the nephrectomy scar. Vascular: No leg edema.  Prominent vein pattern left chest and arm.   Lab Results:  Lab Results  Component Value Date   WBC 6.5 07/04/2017   HGB 13.1 07/04/2017   HCT 38.8 07/04/2017   MCV 89.0 07/04/2017   PLT 259 07/04/2017   NEUTROABS 5.0 07/04/2017    Imaging:  No results found.  Medications: I have reviewed the patient's current medications.  Assessment/Plan: 1. Rectal cancer, clinical stage III (T3 N1) status post biopsy of a rectal mass on 05/24/2013 confirming adenocarcinoma. Endoscopic ultrasound 05/31/2013 measured the mass at 11 cm from the anal verge, uT3uN1.  Initiation of radiation and concurrent Xeloda 06/18/2013; completion 07/26/2013.   Status post low anterior resection 09/02/2013. Invasive adenocarcinoma, 3.7 cm, negative margins; metastatic carcinoma in  7 of 11 lymph nodes; extensive lymph vascular involvement by tumor.MSI-stable, no loss of mismatch repair protein expression  Cycle 1 adjuvant FOLFOX 10/15/2013.   Cycle 2 adjuvant FOLFOX 10/29/2013.   Cycle 3 adjuvant FOLFOX 11/14/2013.   Cycle 4 adjuvant FOLFOX 11/27/2013.   Cycle 5 adjuvant FOLFOX 12/10/2013   Cycle 6 adjuvant FOLFOX 12/24/2013.   Cycle 7 adjuvant FOLFOX 01/14/2014.   Cycle 8 adjuvant FOLFOX 01/28/2014   Cycle 9 adjuvant FOLFOX 02/11/2014 (oxaliplatin dose reduced secondary to thrombocytopenia)   Surveillance CT scans 06/02/2014 with no evidence of recurrent rectal cancer  surveillance CT scans 06/16/2015 with no evidence of recurrent rectal cancer, 10 mm right external iliac node , increased amount of loculated air associated with the presacral soft tissue thickening , new left hydronephrosis  Surveillance CT scans 05/06/2016-negative for recurrent rectal cancer, decreased right external iliac node, persistent left hydronephrosis 2. Post colonoscopy bowel perforation confirmed on abdomen CT 05/28/2013. 3. Single indeterminate liver lesion on the abdomen CT 05/28/2013. MRI on 06/18/2013 confirmed 2 tiny liver lesions, indeterminate-favored to be benign cysts. 4. History of cervical and lumbar disc disease.  5. History of HELLP syndrome with first pregnancy.  6. Delayed nausea following cycle 1 FOLFOX. She receives Aloxi and Emend. She continues to experience delayed nausea. Prophylactic Decadron added with cycle 4. 7. History of pain with bowel movements. 8. Anastomotic leak on CT 09/13/2013. Status post exploratory laparotomy with drainage of a pelvic abscess, diverting loop ileostomy. Drainage catheter removed 10/23/2013.  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 Perineal  numbness, urinary retention. The urinary retention has  improved. 10. Perineal pain-improved. 11. Neutropenia secondary chemotherapy-she received Neulasta. 12. Urinary tract infection, Escherichia coli-11/03/2013. 13. Venous engorgement at the left anterior chest and left arm with swelling of the left arm and hand. Venous Doppler 11/14/2013 positive for DVT. She began Lovenox 11/14/2013 and was switched to xarelto 12/10/2013. Xarelto discontinued early January 2016. 14. Fever 11/20/2013. Blood and urine cultures negative. The fever resolved within 24 hours. She continued to have a single episode of fever following chemotherapy 15. History of a herpetic lip lesion 16. 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 17. Escherichia coli bacteremia, urinary source, 01/14/2014. She completed a 10 day course of ceftriaxone. 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 Anxiety/depression. 20. Port-A-Cath removal 06/09/2014. 21. Escherichia coli urinary tract infection 06/24/2014. She completed a course of ciprofloxacin. 22. frequent bowel movements following surgery/radiation -status post placement of a sacral stimulator 11/21/2014 23. CT 06/16/2015 with new left hydronephrosis status post cystoscopy with placement of a double-J stent 07/17/2015. No tumors or stones were seen in the bladder. Both ureteral orifices were unremarkable. On the left side retrograde study was performed. The distal ureter was narrowed and then a little bit more dilated proximally with hydronephrosis noted. There was no evidence of intramural tumor but the area did seem a bit strictured.  Stent removed, persistent hydronephrosis, followed by Dr. Amalia Hailey  CT 05/06/2016-increased left hydronephrosis  Ultrasound 09/09/2016-stable left hydroureteronephrosis  Left nephrectomy 04/28/2017  24.  CT abdomen/pelvis 07/05/2017-new left para-aortic, aortocaval, right common iliac and right external iliac lymphadenopathy.     Disposition: Ms.  Dunkley has a history of rectal cancer dating to late 2014.  Recent CT scans done to evaluate left-sided abdominal pain/swelling showed abdominal and pelvic lymphadenopathy.  On exam today she appears to have palpable left supraclavicular adenopathy.  We are referring her for CT scans of the neck and chest with the plan for biopsy of left supraclavicular adenopathy if this is confirmed on CT.  She will return for a follow-up visit on 07/19/2017 to review the biopsy results.  She will contact the office in the interim with any problems.  Patient seen with Dr. Benay Spice.  CT report and images reviewed with Ms. Couvillon and her husband.  25 minutes were spent face-to-face at today's visit with the majority of that time involved in counseling/coordination of care.    Ned Card ANP/GNP-BC   07/07/2017  3:29 PM  This was a shared visit with Ned Card.  Ms. Mordan was interviewed and examined.  A CT abdomen/pelvis reveals pelvic and rectal peritoneal lymphadenopathy.  There is a palpable mass in the left supraclavicular region.  We discussed the differential diagnosis and reviewed the CT images with her.  She will be referred for staging CTs of the neck and chest.  We referred her for a biopsy of the left neck mass.  She will return for an office visit after the biopsy procedure.  [name], MD

## 2017-07-07 NOTE — Telephone Encounter (Signed)
Gave avs and calendar for January  °

## 2017-07-13 ENCOUNTER — Ambulatory Visit (HOSPITAL_COMMUNITY)
Admission: RE | Admit: 2017-07-13 | Discharge: 2017-07-13 | Disposition: A | Discharge status: Home / Self Care | Payer: 59 | Source: Ambulatory Visit | Attending: Nurse Practitioner | Admitting: Nurse Practitioner

## 2017-07-13 DIAGNOSIS — R590 Localized enlarged lymph nodes: Secondary | ICD-10-CM | POA: Insufficient documentation

## 2017-07-13 DIAGNOSIS — Z85048 Personal history of other malignant neoplasm of rectum, rectosigmoid junction, and anus: Secondary | ICD-10-CM | POA: Diagnosis not present

## 2017-07-13 DIAGNOSIS — C20 Malignant neoplasm of rectum: Secondary | ICD-10-CM

## 2017-07-13 MED ORDER — IOPAMIDOL (ISOVUE-300) INJECTION 61%
INTRAVENOUS | Status: AC
  Filled 2017-07-13: qty 75

## 2017-07-13 MED ORDER — IOPAMIDOL (ISOVUE-300) INJECTION 61%
75.0000 mL | Freq: Once | INTRAVENOUS | Status: AC | PRN
  Administered 2017-07-13: 75 mL via INTRAVENOUS

## 2017-07-14 ENCOUNTER — Telehealth: Payer: Self-pay | Admitting: *Deleted

## 2017-07-14 NOTE — Telephone Encounter (Signed)
Informed pt of CT result, per MD note below. Informed her we may need to move office visit out a few days so biopsy results are available. Pt voiced understanding.

## 2017-07-14 NOTE — Telephone Encounter (Signed)
-----   Message from Ladell Pier, MD sent at 07/13/2017  6:55 PM EST ----- Please call patient, the CT neck/chest confirms enlarged lymph nodes in the left lower neck, small lymph nodes in the mediastinum  Proceed with biopsy of the left neck lymph node as planned,  Follow-up as scheduled

## 2017-07-19 ENCOUNTER — Telehealth: Payer: Self-pay | Admitting: Oncology

## 2017-07-19 ENCOUNTER — Ambulatory Visit: Payer: Managed Care, Other (non HMO) | Admitting: Nurse Practitioner

## 2017-07-19 NOTE — Telephone Encounter (Signed)
Scheduled appt per 1/29 sch message - patient is aware of appt date and time.

## 2017-07-21 ENCOUNTER — Telehealth: Payer: Self-pay

## 2017-07-21 NOTE — Telephone Encounter (Signed)
Unable to reach pt regarding questions on biopsy

## 2017-07-24 ENCOUNTER — Other Ambulatory Visit: Payer: Self-pay | Admitting: Radiology

## 2017-07-25 ENCOUNTER — Encounter (HOSPITAL_COMMUNITY): Payer: Self-pay

## 2017-07-25 ENCOUNTER — Ambulatory Visit (HOSPITAL_COMMUNITY)
Admission: RE | Admit: 2017-07-25 | Discharge: 2017-07-25 | Disposition: A | Discharge status: Home / Self Care | Payer: 59 | Source: Ambulatory Visit | Attending: Nurse Practitioner | Admitting: Nurse Practitioner

## 2017-07-25 DIAGNOSIS — F419 Anxiety disorder, unspecified: Secondary | ICD-10-CM | POA: Diagnosis not present

## 2017-07-25 DIAGNOSIS — Z79899 Other long term (current) drug therapy: Secondary | ICD-10-CM | POA: Diagnosis not present

## 2017-07-25 DIAGNOSIS — C770 Secondary and unspecified malignant neoplasm of lymph nodes of head, face and neck: Secondary | ICD-10-CM | POA: Diagnosis not present

## 2017-07-25 DIAGNOSIS — C20 Malignant neoplasm of rectum: Secondary | ICD-10-CM | POA: Diagnosis present

## 2017-07-25 DIAGNOSIS — I898 Other specified noninfective disorders of lymphatic vessels and lymph nodes: Secondary | ICD-10-CM | POA: Insufficient documentation

## 2017-07-25 DIAGNOSIS — I10 Essential (primary) hypertension: Secondary | ICD-10-CM | POA: Diagnosis not present

## 2017-07-25 LAB — CBC WITH DIFFERENTIAL/PLATELET
BASOS ABS: 0 10*3/uL (ref 0.0–0.1)
BASOS PCT: 0 %
EOS ABS: 0.1 10*3/uL (ref 0.0–0.7)
EOS PCT: 3 %
HCT: 36.7 % (ref 36.0–46.0)
Hemoglobin: 12.1 g/dL (ref 12.0–15.0)
LYMPHS PCT: 16 %
Lymphs Abs: 0.6 10*3/uL — ABNORMAL LOW (ref 0.7–4.0)
MCH: 29.2 pg (ref 26.0–34.0)
MCHC: 33 g/dL (ref 30.0–36.0)
MCV: 88.4 fL (ref 78.0–100.0)
Monocytes Absolute: 0.3 10*3/uL (ref 0.1–1.0)
Monocytes Relative: 8 %
Neutro Abs: 2.8 10*3/uL (ref 1.7–7.7)
Neutrophils Relative %: 73 %
PLATELETS: 217 10*3/uL (ref 150–400)
RBC: 4.15 MIL/uL (ref 3.87–5.11)
RDW: 12.8 % (ref 11.5–15.5)
WBC: 3.8 10*3/uL — AB (ref 4.0–10.5)

## 2017-07-25 LAB — BASIC METABOLIC PANEL
Anion gap: 7 (ref 5–15)
BUN: 18 mg/dL (ref 6–20)
CALCIUM: 8.4 mg/dL — AB (ref 8.9–10.3)
CO2: 27 mmol/L (ref 22–32)
CREATININE: 0.87 mg/dL (ref 0.44–1.00)
Chloride: 105 mmol/L (ref 101–111)
GFR calc Af Amer: >60 mL/min (ref >60)
Glucose, Bld: 91 mg/dL (ref 65–99)
POTASSIUM: 3.5 mmol/L (ref 3.5–5.1)
SODIUM: 139 mmol/L (ref 135–145)

## 2017-07-25 LAB — PROTIME-INR
INR: 0.95
PROTHROMBIN TIME: 12.6 s (ref 11.4–15.2)

## 2017-07-25 MED ORDER — FENTANYL CITRATE (PF) 100 MCG/2ML IJ SOLN
INTRAMUSCULAR | Status: AC | PRN
  Administered 2017-07-25 (×2): 50 ug via INTRAVENOUS

## 2017-07-25 MED ORDER — MIDAZOLAM HCL 2 MG/2ML IJ SOLN
INTRAMUSCULAR | Status: AC | PRN
  Administered 2017-07-25 (×2): 1 mg via INTRAVENOUS

## 2017-07-25 MED ORDER — FENTANYL CITRATE (PF) 100 MCG/2ML IJ SOLN
INTRAMUSCULAR | Status: AC
  Filled 2017-07-25: qty 2

## 2017-07-25 MED ORDER — MIDAZOLAM HCL 2 MG/2ML IJ SOLN
INTRAMUSCULAR | Status: AC
  Filled 2017-07-25: qty 2

## 2017-07-25 MED ORDER — LIDOCAINE HCL (PF) 1 % IJ SOLN
INTRAMUSCULAR | Status: AC | PRN
  Administered 2017-07-25: 30 mL

## 2017-07-25 MED ORDER — LIDOCAINE HCL 1 % IJ SOLN
INTRAMUSCULAR | Status: AC
  Filled 2017-07-25: qty 10

## 2017-07-25 MED ORDER — SODIUM CHLORIDE 0.9 % IV SOLN
INTRAVENOUS | Status: DC
  Administered 2017-07-25: 12:00:00 via INTRAVENOUS

## 2017-07-25 NOTE — Procedures (Signed)
Interventional Radiology Procedure Note  Procedure: US guided biopsy of left supraclavicular lymph node. .  Complications: None Recommendations:  - Ok to shower tomorrow - Do not submerge for 7 days - Routine care   Signed,  Dulcy Fanny. Earleen Newport, DO

## 2017-07-25 NOTE — Consult Note (Signed)
Chief Complaint: Patient was seen in consultation today for ultrasound-guided left supraclavicular lymph node biopsy  Referring Physician(s): Sherrill,B  Supervising Physician: Corrie Mckusick  Patient Status: [name] - Out-pt  History of Present Illness: [name] is a 57 y.o. female with history of rectal cancer in 2014, status post surgery and chemoradiation.  Recent follow-up imaging has revealed adenopathy within the left supraclavicular region, mediastinum ,iliac region and retroperitoneum new compared to 2017.  Request now received for ultrasound-guided left supraclavicular lymph node biopsy for further evaluation.  Past Medical History:  Diagnosis Date  . Anxiety   . Arthritis 08-27-13   at present has ruptured disc- lower back-not an issue now  . Blood infection ([place])    E Coli after UTI   . Colon cancer ([place]) 08-27-13   radiation /chemo -last ended 4 weeks(Dr. Benay Spice)  . DVT (deep venous thrombosis) ([place])    left upper extremity currently has stopped xarelto   . History of radiation therapy 06/18/13-07/26/13   rectal 50.4Gy total dose  . Hypertension    toxemia with pregnancy 1990  . IUD (intrauterine device) in place 08-27-13   Mirena Implant inplace  . Nerve damage    numbness in pelvic area from rectal rection straining to empty bladder   . PONV (postoperative nausea and vomiting)    scopalamine patch helped  . Stress incontinence   . UTI (lower urinary tract infection)    hx of     Past Surgical History:  Procedure Laterality Date  . APPENDECTOMY    . BACK SURGERY    . bladder study      pressure readings had approx 1.5 months ago   . BREAST SURGERY     reduction surgery  . CERVICAL DISC ARTHROPLASTY  01/03/2012   Procedure: CERVICAL ANTERIOR DISC ARTHROPLASTY;  Surgeon: Charlie Pitter, MD;  Location: [facility];  Service: Neurosurgery;  Laterality: N/A;  Cervical Anterior Disc Arthorplasty Five-Six  . CESAREAN SECTION     x2  . EUS N/A 05/31/2013    Procedure: LOWER ENDOSCOPIC ULTRASOUND (EUS);  Surgeon: Beryle Beams, MD;  Location: [facility];  Service: Endoscopy;  Laterality: N/A;  . ILEO LOOP COLOSTOMY CLOSURE N/A 05/02/2014   Procedure: LAPAROSCOPIC ASSISTED REVERSAL ILEOSTOMY AND LYSIS OF ADHESIONS;  Surgeon: [name], MD;  Location: [facility];  Service: General;  Laterality: N/A;  . INTRAUTERINE DEVICE INSERTION  2006   [facility]  . KNEE ARTHROSCOPY Left 08-27-13   torn meniscus repair left   . LAPAROSCOPIC LOW ANTERIOR RESECTION N/A 09/02/2013   Procedure: LAPAROSCOPIC LOW ANTERIOR RESECTION;  Surgeon: [name], MD;  Location: [facility];  Service: General;  Laterality: N/A;  . LAPAROTOMY N/A 09/13/2013   Procedure: EXPLORATORY LAPAROTOMY / FLEXIBLE SIGMOIDOSCOPY/  DRAINAGE OF PELVIC ABSCESS WITH  DIVERTING  LOOP  ILEOSTOMY;  Surgeon: [name], MD;  Location: [facility];  Service: General;  Laterality: N/A;  . LYSIS OF ADHESION  05/02/2014   Procedure: LYSIS OF ADHESION;  Surgeon: [name], MD;  Location: [facility];  Service: General;;  . PORT-A-CATH REMOVAL Left 06/09/2014   Procedure: REMOVAL PORT-A-CATH  (MINOR PROCEDURE) ;  Surgeon: [name], MD;  Location: [facility];  Service: General;  Laterality: Left;  . PORTACATH PLACEMENT Left 10/11/2013   Procedure: INSERTION PORT-A-CATH;  Surgeon: [name], MD;  Location: [facility];  Service: General;  Laterality: Left;  . REFRACTIVE SURGERY      Allergies: Patient has no known  allergies.  Medications: Prior to Admission medications   Medication Sig Start Date End Date Taking? Authorizing Provider  acetaminophen (TYLENOL) 500 MG tablet Take 1,000 mg by mouth every 6 (six) hours as needed for mild pain.   Yes [provider]  MINIVELLE 0.075 MG/24HR Place 1 patch onto the skin 2 (two) times a week.  10/23/15  Yes [provider]  Multiple Vitamin (MULTIVITAMIN WITH MINERALS) TABS tablet Take 1 tablet by mouth daily.   Yes [provider]  Pancrelipase, Lip-Prot-Amyl, (ZENPEP) 40000 units CPEP Take 40,000 Units by mouth 3 (three) times daily with meals. May take up to 4 times 11/21/14  Yes [provider]  progesterone (PROMETRIUM) 100 MG capsule Take 100 mg by mouth at bedtime.  10/29/15  Yes [provider]  sertraline (ZOLOFT) 100 MG tablet Take 150 mg by mouth daily.  06/09/15  Yes [provider]  acyclovir (ZOVIRAX) 400 MG tablet Take 1 tablet (400 mg total) by mouth 2 (two) times daily as needed. 12/02/16   Ladell Pier, MD     History reviewed. No pertinent family history.  Social History   Socioeconomic History  . Marital status: Married    Spouse name: None  . Number of children: None  . Years of education: None  . Highest education level: None  Social Needs  . Financial resource strain: None  . Food insecurity - worry: None  . Food insecurity - inability: None  . Transportation needs - medical: None  . Transportation needs - non-medical: None  Occupational History  . None  Tobacco Use  . Smoking status: Never Smoker  . Smokeless tobacco: Never Used  Substance and Sexual Activity  . Alcohol use: Yes    Comment: weekends   . Drug use: No  . Sexual activity: Yes    Birth control/protection: IUD  Other Topics Concern  . None  Social History Narrative  . None      Review of Systems currently denies fever, headache, chest pain, dyspnea, cough, back pain, nausea, vomiting or bleeding.  She does have some mild left lateral abdominal discomfort/occasional burning sensation  Vital Signs: BP 120/83   Pulse 73   Temp 98.1 F (36.7 C) (Oral)   Resp 18   Ht 5\' 4"  (1.626 m)   Wt 156 lb (70.8 kg)   SpO2 98%   BMI 26.78 kg/m   Physical Exam awake, alert.  Chest clear to auscultation bilaterally.  Heart with regular rate and rhythm.  Abdomen soft, positive bowel sounds, mild left lateral abdominal tenderness (prior history of left nephrectomy).  No significant  lower extremity edema.  Imaging: Ct Soft Tissue Neck W Contrast  Result Date: 07/13/2017 CLINICAL DATA:  History of rectal cancer. Question left neck adenopathy. EXAM: CT NECK WITH CONTRAST TECHNIQUE: Multidetector CT imaging of the neck was performed using the standard protocol following the bolus administration of intravenous contrast. CONTRAST:  64mL ISOVUE-300 IOPAMIDOL (ISOVUE-300) INJECTION 61% COMPARISON:  None. FINDINGS: Pharynx and larynx: Negative for mass or inflammation. Salivary glands: Normal Thyroid: Normal Lymph nodes: There are 2 enlarged partially low-density lymph nodes in the left supraclavicular fossa measuring up to 26 mm on coronal reformats. These are anterior to the scalenes and posterior to the IJ. Vascular: Normal Limited intracranial: Negative Visualized orbits: Negative Mastoids and visualized paranasal sinuses: Clear other than small retention cysts in the left maxillary sinus. Skeleton: No acute or aggressive finding.  C5-6 disc arthroplasty. Upper chest: Reported separately IMPRESSION: Left  supraclavicular adenopathy. There is history of rectal cancer and this is likely metastatic spread via the thoracic duct. Electronically Signed   By: Monte Fantasia M.D.   On: 07/13/2017 09:36   Ct Chest W Contrast  Result Date: 07/13/2017 CLINICAL DATA:  Right-sided neck pain and swelling. History of rectal cancer. EXAM: CT CHEST WITH CONTRAST TECHNIQUE: Multidetector CT imaging of the chest was performed during intravenous contrast administration. CONTRAST:  36mL ISOVUE-300 IOPAMIDOL (ISOVUE-300) INJECTION 61% COMPARISON:  05/06/2016 FINDINGS: Cardiovascular: The heart size appears normal. There is no pericardial effusion. Mediastinum/Nodes: The trachea appears patent and midline. Normal appearance of the esophagus. Left supraclavicular node measures 1.2 cm, image 4 of series 5. No axillary adenopathy. Right paratracheal node measures 9 mm, image 16 of series 5. Lower right paratracheal  node measures 11 mm, image 21 of series 5. these are new from 05/06/2016. Lungs/Pleura: No pleural effusions. No airspace consolidation or atelectasis. 3 mm subpleural nodule overlies the left upper lobe. Unchanged from 05/06/2016. Likely benign. Upper Abdomen: No acute abnormality identified within the upper abdomen. Subcentimeter low-attenuation structure within the anterolateral right lobe is unchanged from previous exam, image 45 of series 5. Retroperitoneal adenopathy is identified. New from 05/06/2016. Index aortocaval node measures 1.6 cm, image 62 of series 5. Left retroperitoneal lymph node measures 1.2 cm, image 62 of series 5. Musculoskeletal: There is no aggressive lytic or sclerotic bone lesions. IMPRESSION: 1. Adenopathy identified within the left supraclavicular region, mediastinum and retroperitoneum of the upper abdomen. This is a new finding when compared with 05/06/2016. Findings may represent metastatic disease or lymphoproliferative disorder. Consider further evaluation with PET-CT and tissue sampling. Electronically Signed   By: Kerby Moors M.D.   On: 07/13/2017 09:55   Ct Abdomen Pelvis W Contrast  Result Date: 07/05/2017 CLINICAL DATA:  Abdominal distension. History colon cancer status post LAR 09/02/2013 and radiation therapy. History of appendectomy. Reported recent left nephrectomy related to chronic left ureteral obstruction. EXAM: CT ABDOMEN AND PELVIS WITH CONTRAST TECHNIQUE: Multidetector CT imaging of the abdomen and pelvis was performed using the standard protocol following bolus administration of intravenous contrast. CONTRAST:  159mL ISOVUE-300 IOPAMIDOL (ISOVUE-300) INJECTION 61% COMPARISON:  05/06/2016 CT abdomen/pelvis. FINDINGS: Lower chest: No significant pulmonary nodules or acute consolidative airspace disease. Hepatobiliary: Normal liver size. Subcentimeter hypodense anterior right liver lobe lesion is too small to characterize and stable since 05/06/2016 CT,  considered benign. No new liver lesions. Normal gallbladder with no radiopaque cholelithiasis. No biliary ductal dilatation. Pancreas: Normal, with no mass or duct dilation. Spleen: Normal size. No mass. Adrenals/Urinary Tract: Normal adrenals. Interval left nephrectomy. No mass or fluid collection in the left nephrectomy bed. Normal right kidney with no right hydronephrosis and no right renal mass. Normal bladder. Stomach/Bowel: Normal non-distended stomach. Normal caliber small bowel with no small bowel wall thickening. Appendectomy. Stable postsurgical changes from low anterior resection and colorectal anastomosis. No acute large bowel wall thickening or pericolonic fat stranding. Stable chronic presacral soft tissue density. 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 Vascular/Lymphatic: Normal caliber abdominal aorta. Patent portal, splenic, hepatic and right renal veins. Enlarged 1.6 cm aortocaval node (series 2/image 28), new since 05/06/2016. Clustered enlarged left para-aortic nodes measuring up to 1.8 cm (series 2/image 26), new. Newly enlarged heterogeneous 1.6 cm right external iliac node (series 2/image 62). Newly enlarged 1.1 cm right common iliac node (series 2/image 49). Reproductive: Intrauterine device is grossly well-positioned in the uterine cavity. No  adnexal mass. Other: No pneumoperitoneum, ascites or focal fluid collection. Fat stranding in the midline ventral abdominal wall from prior laparotomy. No superficial fluid collections or masses. No evidence of a ventral abdominal hernia. Musculoskeletal: No aggressive appearing focal osseous lesions. Marked thoracolumbar spondylosis. Left lower back spinal stimulator device with lead terminating in left lower presacral space. IMPRESSION: 1. New left para-aortic, aortocaval, right common iliac and right external iliac lymphadenopathy, worrisome for metastatic disease. 2. Interval  left nephrectomy with no mass or fluid collection in the left nephrectomy bed. 3. No evidence of a ventral abdominal hernia. No superficial fluid collections. 4. 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 Stable nonspecific soft tissue density in the presacral space, probably treatment related. Electronically Signed   By: Ilona Sorrel M.D.   On: 07/05/2017 01:00    Labs:  CBC: Recent Labs    07/04/17 2139 07/25/17 1137  WBC 6.5 3.8*  HGB 13.1 12.1  HCT 38.8 36.7  PLT 259 217    COAGS: Recent Labs    07/25/17 1137  INR 0.95    BMP: Recent Labs    11/08/16 1205 07/04/17 2139  NA 138 138  K 4.2 3.1*  CL  --  102  CO2 26 26  GLUCOSE 90 119*  BUN 23.0 26*  CALCIUM 9.6 9.4  CREATININE 1.0 0.90  GFRNONAA  --  >60  GFRAA  --  >60    LIVER FUNCTION TESTS: Recent Labs    07/04/17 2139  BILITOT 0.2*  AST 18  ALT 13*  ALKPHOS 63  PROT 7.6  ALBUMIN 3.9    TUMOR MARKERS: No results for input(s): AFPTM, CEA, CA199, CHROMGRNA in the last 8760 hours.  Assessment and Plan: 57 y.o. female with history of rectal cancer in 2014, status post surgery and chemoradiation.  Recent follow-up imaging has revealed adenopathy within the left supraclavicular region, mediastinum ,iliac region and retroperitoneum new compared to 2017.  Request now received for ultrasound-guided left supraclavicular lymph node biopsy for further evaluation.Risks and benefits discussed with the patient/spouse including, but not limited to bleeding, infection, damage to adjacent structures or low yield requiring additional tests. All of the patient's questions were answered, patient is agreeable to proceed.Consent signed and in chart.     Thank you for this interesting consult.  I greatly enjoyed meeting LESLEY ATKIN and look forward to participating in their care.  A copy of this report was sent to the requesting provider on  this date.  Electronically Signed: D. Rowe Robert, PA-C 07/25/2017, 12:19 PM   I spent a total of 20 minutes  in face to face in clinical consultation, greater than 50% of which was counseling/coordinating care for ultrasound-guided left supraclavicular lymph node biopsy

## 2017-07-25 NOTE — Discharge Instructions (Signed)
Biopsy Discharge Instructions ° °The procedure you just had is called a biopsy.  You may feel some discomfort after the local anesthetic wears off.  Your discomfort should improve over the next several days. ° °AFTER YOUR BIOPSY °· Rest for the remainder of the day. °· Avoid heavy lifting (more than 10 lb/4.5 kg). °· If you have been given a general anesthetic or other medications to help you relax, you should not operate machinery, drive or make legal decisions for 24 hours after your procedure.  Additionally, someone must be available to drive you home. °· Only take over-the-counter or prescription medicines for pain, discomfort, or fever as directed by your caregiver.  This can make bleeding worse. °· You may resume your usual diet after the procedure. °· Avoid alcoholic beverages for 24 hours after your procedure. °· Keep the skin around your biopsy site clean and dry. °· You may shower after 24 hours.  Cleanse and dry the biopsy site completely after you shower.  Avoid baths and swimming for 72 hours. ° °Complications are very uncommon after this procedure.  Go to the nearest Emergency Department or contact your caregiver if you develop any of the following symptoms: °· Worsening pain °· Bleeding °· Swelling at the biopsy site °· Light headedness or dizziness °· Shortness of Breath °· Fever or chills °Redness or increased pain or swelling at the biopsy site   Moderate Conscious Sedation, Adult, Care After °These instructions provide you with information about caring for yourself after your procedure. Your health care provider may also give you more specific instructions. Your treatment has been planned according to current medical practices, but problems sometimes occur. Call your health care provider if you have any problems or questions after your procedure. °What can I expect after the procedure? °After your procedure, it is common: °· To feel sleepy for several hours. °· To feel clumsy and have poor balance  for several hours. °· To have poor judgment for several hours. °· To vomit if you eat too soon. ° °Follow these instructions at home: °For at least 24 hours after the procedure: ° °· Do not: °? Participate in activities where you could fall or become injured. °? Drive. °? Use heavy machinery. °? Drink alcohol. °? Take sleeping pills or medicines that cause drowsiness. °? Make important decisions or sign legal documents. °? Take care of children on your own. °· Rest. °Eating and drinking °· Follow the diet recommended by your health care provider. °· If you vomit: °? Drink water, juice, or soup when you can drink without vomiting. °? Make sure you have little or no nausea before eating solid foods. °General instructions °· Have a responsible adult stay with you until you are awake and alert. °· Take over-the-counter and prescription medicines only as told by your health care provider. °· If you smoke, do not smoke without supervision. °· Keep all follow-up visits as told by your health care provider. This is important. °Contact a health care provider if: °· You keep feeling nauseous or you keep vomiting. °· You feel light-headed. °· You develop a rash. °· You have a fever. °Get help right away if: °· You have trouble breathing. °This information is not intended to replace advice given to you by your health care provider. Make sure you discuss any questions you have with your health care provider. °Document Released: 03/27/2013 Document Revised: 11/09/2015 Document Reviewed: 09/26/2015 °[employer] Interactive Patient Education © 2018 [employer] ° °

## 2017-07-26 ENCOUNTER — Inpatient Hospital Stay: Payer: 59 | Attending: Nurse Practitioner | Admitting: Oncology

## 2017-07-26 ENCOUNTER — Telehealth: Payer: Self-pay | Admitting: Oncology

## 2017-07-26 ENCOUNTER — Ambulatory Visit: Payer: Managed Care, Other (non HMO) | Admitting: Nurse Practitioner

## 2017-07-26 VITALS — BP 126/84 | HR 69 | Temp 98.3°F | Resp 18 | Ht 64.0 in | Wt 156.8 lb

## 2017-07-26 DIAGNOSIS — C20 Malignant neoplasm of rectum: Secondary | ICD-10-CM | POA: Insufficient documentation

## 2017-07-26 DIAGNOSIS — C770 Secondary and unspecified malignant neoplasm of lymph nodes of head, face and neck: Secondary | ICD-10-CM | POA: Insufficient documentation

## 2017-07-26 DIAGNOSIS — Z905 Acquired absence of kidney: Secondary | ICD-10-CM | POA: Diagnosis not present

## 2017-07-26 MED ORDER — ALPRAZOLAM 0.5 MG PO TABS: 0.5000 mg | ORAL_TABLET | Freq: Every evening | ORAL | 0 refills | Status: DC | PRN

## 2017-07-26 NOTE — Progress Notes (Signed)
[place] OFFICE PROGRESS NOTE   Diagnosis: Rectal cancer  INTERVAL HISTORY:   [name] returns as scheduled.  She underwent ultrasound-guided biopsy of a left supraclavicular lymph node on 07/25/2017.  I spoke with the pathologist today and he reports the histology is consistent with metastatic colorectal cancer.  I requested Foundation 1 testing.  She continues to have fullness near the nephrectomy scar with mild discomfort.  She has frequent bowel movements and the stool is now thinner.   Objective:  Vital signs in last 24 hours:  Blood pressure 126/84, pulse 69, temperature 98.3 F (36.8 C), temperature source Oral, resp. rate 18, height _0  (1.626 m), weight 156 lb 12.8 oz (71.1 kg), SpO2 99 %.    HEENT: Mass at the medial left supraclavicular fossa with an overlying ecchymosis Resp: Lungs clear bilaterally Cardio: Regular rate and rhythm GI: No hepatomegaly, slight soft fullness surrounding the nephrectomy scar at the left lateral abdomen,?  Hernia.  No mass. Vascular: No leg edema  Skin: No rash at the abdominal wall or left flank    Lab Results:  Lab Results  Component Value Date   WBC 3.8 (L) 07/25/2017   HGB 12.1 07/25/2017   HCT 36.7 07/25/2017   MCV 88.4 07/25/2017   PLT 217 07/25/2017   NEUTROABS 2.8 07/25/2017    CMP     Component Value Date/Time   NA 139 07/25/2017 1137   NA 138 11/08/2016 1205   K 3.5 07/25/2017 1137   K 4.2 11/08/2016 1205   CL 105 07/25/2017 1137   CO2 27 07/25/2017 1137   CO2 26 11/08/2016 1205   GLUCOSE 91 07/25/2017 1137   GLUCOSE 90 11/08/2016 1205   BUN 18 07/25/2017 1137   BUN 23.0 11/08/2016 1205   CREATININE 0.87 07/25/2017 1137   CREATININE 1.0 11/08/2016 1205   CALCIUM 8.4 (L) 07/25/2017 1137   CALCIUM 9.6 11/08/2016 1205   PROT 7.6 07/04/2017 2139   PROT 6.5 06/02/2014 1004   ALBUMIN 3.9 07/04/2017 2139   ALBUMIN 3.4 (L) 06/02/2014 1004   AST 18 07/04/2017 2139   AST 16 06/02/2014  1004   ALT 13 (L) 07/04/2017 2139   ALT 18 06/02/2014 1004   ALKPHOS 63 07/04/2017 2139   ALKPHOS 69 06/02/2014 1004   BILITOT 0.2 (L) 07/04/2017 2139   BILITOT 0.36 06/02/2014 1004   GFRNONAA >60 07/25/2017 1137   GFRAA >60 07/25/2017 1137    Lab Results  Component Value Date   CEA1 3.91 05/16/2017    Lab Results  Component Value Date   INR 0.95 07/25/2017    Imaging:  [place] Core Biopsy (lymph Nodes)  Result Date: 07/25/2017 INDICATION: 57 year old female with a history neck lymphadenopathy EXAM: ULTRASOUND-GUIDED BIOPSY LEFT NECK LYMPH NODES MEDICATIONS: None. ANESTHESIA/SEDATION: Moderate (conscious) sedation was employed during this procedure. A total of Versed 2.0 mg and Fentanyl 100 mcg was administered intravenously. Moderate Sedation Time: 10 minutes. [name] level of consciousness and vital signs were monitored continuously by radiology nursing throughout the procedure under my direct supervision. FLUOROSCOPY TIME:  None COMPLICATIONS: None PROCEDURE: The procedure, risks, benefits, and alternatives were explained to the patient. Questions regarding the procedure were encouraged and answered. The patient understands and consents to the procedure. Ultrasound survey was performed with images stored and sent to PACs. The left neck was prepped with chlorhexidine in a sterile fashion, and a sterile drape was applied covering the operative field. A sterile gown and sterile gloves were used for  the procedure. Local anesthesia was provided with 1% Lidocaine. Ultrasound guidance was used to infiltrate the region with 1% lidocaine for local anesthesia. Multiple 18 gauge core biopsy were then acquired with ultrasound guidance. Final image was stored after biopsy. Patient tolerated the procedure well and remained hemodynamically stable throughout. No complications were encountered and no significant blood loss was encounter IMPRESSION: Status post ultrasound-guided biopsy of left  supraclavicular lymph node. Tissue specimen sent to pathology for complete histopathologic analysis Signed, Dulcy Fanny. Earleen Newport, DO [facility] Electronically Signed   By: Corrie Mckusick D.O.   On: 07/25/2017 14:30    Medications: I have reviewed the patient's current medications.   Assessment/Plan:  1. Rectal cancer, clinical stage III (T3 N1) status post biopsy of a rectal mass on 05/24/2013 confirming adenocarcinoma. Endoscopic ultrasound 05/31/2013 measured the mass at 11 cm from the anal verge, uT3uN1.  Initiation of radiation and concurrent Xeloda 06/18/2013; completion 07/26/2013.   Status post low anterior resection 09/02/2013. Invasive adenocarcinoma, 3.7 cm, negative margins; metastatic carcinoma in 7 of 11 lymph nodes; extensive lymph vascular involvement by tumor.MSI-stable, no loss of mismatch repair protein expression  Cycle 1 adjuvant FOLFOX 10/15/2013.   Cycle 2 adjuvant FOLFOX 10/29/2013.   Cycle 3 adjuvant FOLFOX 11/14/2013.   Cycle 4 adjuvant FOLFOX 11/27/2013.   Cycle 5 adjuvant FOLFOX 12/10/2013   Cycle 6 adjuvant FOLFOX 12/24/2013.   Cycle 7 adjuvant FOLFOX 01/14/2014.   Cycle 8 adjuvant FOLFOX 01/28/2014   Cycle 9 adjuvant FOLFOX 02/11/2014 (oxaliplatin dose reduced secondary to thrombocytopenia)   Surveillance CT scans 06/02/2014 with no evidence of recurrent rectal cancer  surveillance CT scans 06/16/2015 with no evidence of recurrent rectal cancer, 10 mm right external iliac node , increased amount of loculated air associated with the presacral soft tissue thickening , new left hydronephrosis  Surveillance CT scans 05/06/2016-negative for recurrent rectal cancer, decreased right external iliac node, persistent left hydronephrosis  CT abdomen/pelvis 07/05/2017-new left para-aortic, aortocaval, right common iliac and right external iliac lymphadenopathy.  CT neck/chest 07/13/2017- enlarged  left supraclavicular nodes, new paratracheal nodes  Ultrasound-guided biopsy of a left supraclavicular lymph node 07/25/2017- metastatic adenocarcinoma consistent with colorectal cancer 2. Post colonoscopy bowel perforation confirmed on abdomen CT 05/28/2013. 3. Single indeterminate liver lesion on the abdomen CT 05/28/2013. MRI on 06/18/2013 confirmed 2 tiny liver lesions, indeterminate-favored to be benign cysts. 4. History of cervical and lumbar disc disease.  5. History of HELLP syndrome with first pregnancy.  6. Delayed nausea following cycle 1 FOLFOX. She receives Aloxi and Emend. She continues to experience delayed nausea. Prophylactic Decadron added with cycle 4. 7. History of pain with bowel movements. 8. Anastomotic leak on CT 09/13/2013. Status post exploratory laparotomy with drainage of a pelvic abscess, diverting loop ileostomy. Drainage catheter removed 10/23/2013.  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 Perineal numbness, urinary retention. The urinary retention has improved. 10. Perineal pain-improved. 11. Neutropenia secondary chemotherapy-she received Neulasta. 12. Urinary tract infection, Escherichia coli-11/03/2013. 13. Venous engorgement at the left anterior chest and left arm with swelling of the left arm and hand. Venous Doppler 11/14/2013 positive for DVT. She began Lovenox 11/14/2013 and was switched to xarelto 12/10/2013. Xarelto discontinued early January 2016. 14. Fever 11/20/2013. Blood and urine cultures negative. The fever resolved within 24 hours. She continued to have a single episode of fever following chemotherapy 15. History of a herpetic lip lesion  16. 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 17. Escherichia coli bacteremia, urinary source, 01/14/2014. She completed a 10 day course of ceftriaxone. 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 Anxiety/depression. 20. Port-A-Cath removal 06/09/2014. 21. Escherichia coli urinary tract infection 06/24/2014. She completed a course of ciprofloxacin. 22. frequent bowel movements following surgery/radiation -status post placement of a sacral stimulator 11/21/2014 23. CT 06/16/2015 with new left hydronephrosis status post cystoscopy with placement of a double-J stent 07/17/2015. No tumors or stones were seen in the bladder. Both ureteral orifices were unremarkable. On the left side retrograde study was performed. The distal ureter was narrowed and then a little bit more dilated proximally with hydronephrosis noted. There was no evidence of intramural tumor but the area did seem a bit strictured.  Stent removed, persistent hydronephrosis, followed by Dr. Amalia Hailey  CT 05/06/2016-increased left hydronephrosis  Ultrasound 09/09/2016-stable left hydroureteronephrosis  Left nephrectomy 04/28/2017  24.  CT abdomen/pelvis 07/05/2017-new left para-aortic, aortocaval, right common iliac and right external iliac lymphadenopathy.     Disposition: Ms. Masri appears unchanged.  The left supraclavicular lymph node biopsy confirms metastatic colorectal cancer.  I discussed the biopsy findings, prognosis, and treatment options with her.  She appears to have metastatic rectal cancer involving multiple lymph node sites.  No therapy will be curative.  We discussed observation and systemic treatment options.  The lymph node biopsy will be submitted for Foundation 1 testing in order to look for RAS mutations and potential targetable mutations.  We discussed the [id number] study.  We discussed treatment with an EGFR inhibitor if the tumor returns RAS wild-type.  We decided to make a referral to the GI oncology program at [facility] to discuss the [facility] study and standard treatment options.  I refilled a prescription for Xanax to use at bedtime as needed.  I suspect the  discomfort and fullness at the left lateral abdomen is related to the nephrectomy procedure.  She may have a hernia or nerve injury.  She may benefit from a sigmoidoscopy to evaluate for a rectal stricture or local tumor recurrence.  I will forward a note to Dr. Earlean Shawl.  30 minutes were spent with the patient today.  The majority of the time was used for counseling and coordination of care.  Betsy Coder, MD  07/26/2017  12:33 PM

## 2017-07-26 NOTE — Telephone Encounter (Signed)
Gave avs and calendar for February  °

## 2017-08-04 ENCOUNTER — Telehealth: Payer: Self-pay

## 2017-08-04 NOTE — Telephone Encounter (Signed)
LVM and Informed pt that our clinic is sending and following up on referral to Dr. Minerva Ends.

## 2017-08-07 ENCOUNTER — Telehealth: Payer: Self-pay | Admitting: Oncology

## 2017-08-07 NOTE — Telephone Encounter (Signed)
Pt appt with Dr. Sigurd Sos is 08/16/17 @ 2:00. Pt is aware

## 2017-08-11 ENCOUNTER — Inpatient Hospital Stay (HOSPITAL_BASED_OUTPATIENT_CLINIC_OR_DEPARTMENT_OTHER): Payer: 59 | Admitting: Oncology

## 2017-08-11 ENCOUNTER — Telehealth: Payer: Self-pay

## 2017-08-11 VITALS — BP 126/83 | HR 65 | Temp 98.9°F | Resp 20 | Ht 64.0 in | Wt 158.1 lb

## 2017-08-11 DIAGNOSIS — C20 Malignant neoplasm of rectum: Secondary | ICD-10-CM

## 2017-08-11 NOTE — Progress Notes (Signed)
No charge visit

## 2017-08-11 NOTE — Telephone Encounter (Signed)
Spoke with patient concerning upcoming appointment next week, she also declined for a calender to be mailed to her. Per 2/22 los

## 2017-08-17 ENCOUNTER — Inpatient Hospital Stay: Payer: 59

## 2017-08-17 ENCOUNTER — Other Ambulatory Visit: Payer: Self-pay | Admitting: Medical Oncology

## 2017-08-17 ENCOUNTER — Encounter: Payer: Self-pay | Admitting: Medical Oncology

## 2017-08-17 ENCOUNTER — Inpatient Hospital Stay (HOSPITAL_BASED_OUTPATIENT_CLINIC_OR_DEPARTMENT_OTHER): Payer: 59 | Admitting: Oncology

## 2017-08-17 VITALS — BP 112/75 | HR 82 | Temp 98.4°F | Resp 18 | Ht 64.0 in | Wt 154.6 lb

## 2017-08-17 DIAGNOSIS — C20 Malignant neoplasm of rectum: Secondary | ICD-10-CM

## 2017-08-17 DIAGNOSIS — Z905 Acquired absence of kidney: Secondary | ICD-10-CM

## 2017-08-17 DIAGNOSIS — C770 Secondary and unspecified malignant neoplasm of lymph nodes of head, face and neck: Secondary | ICD-10-CM

## 2017-08-17 NOTE — Progress Notes (Signed)
[place] OFFICE PROGRESS NOTE   Diagnosis: Rectal cancer  INTERVAL HISTORY:   [name] returns as scheduled.  She continues to have lower abdominal discomfort.  No other complaint. She saw Dr. Truman Hayward at [facility] yesterday.  She reports he discussed FOLFIRI/panitumumab, and treatment on the [place] study.  She reviewed potential toxicities associated with these regimens.  She is inclined to enroll on the clinical trial.  She does not wish to experience the acne type rash seen with panitumumab.   Objective:  Vital signs in last 24 hours:  Blood pressure 112/75, pulse 82, temperature 98.4 F (36.9 C), temperature source Oral, resp. rate 18, height _0  (1.626 m), weight 154 lb 9.6 oz (70.1 kg), SpO2 100 %.    HEENT: Firm approximate 3 cm mass at the left lower neck/supraclavicular fossa Resp: Lungs clear bilaterally Cardio: Regular rate and rhythm GI: No hepatomegaly, no mass, nontender Vascular: No leg edema   Lab Results:  Lab Results  Component Value Date   WBC 3.8 (L) 07/25/2017   HGB 12.1 07/25/2017   HCT 36.7 07/25/2017   MCV 88.4 07/25/2017   PLT 217 07/25/2017   NEUTROABS 2.8 07/25/2017    CMP     Component Value Date/Time   NA 139 07/25/2017 1137   NA 138 11/08/2016 1205   K 3.5 07/25/2017 1137   K 4.2 11/08/2016 1205   CL 105 07/25/2017 1137   CO2 27 07/25/2017 1137   CO2 26 11/08/2016 1205   GLUCOSE 91 07/25/2017 1137   GLUCOSE 90 11/08/2016 1205   BUN 18 07/25/2017 1137   BUN 23.0 11/08/2016 1205   CREATININE 0.87 07/25/2017 1137   CREATININE 1.0 11/08/2016 1205   CALCIUM 8.4 (L) 07/25/2017 1137   CALCIUM 9.6 11/08/2016 1205   PROT 7.6 07/04/2017 2139   PROT 6.5 06/02/2014 1004   ALBUMIN 3.9 07/04/2017 2139   ALBUMIN 3.4 (L) 06/02/2014 1004   AST 18 07/04/2017 2139   AST 16 06/02/2014 1004   ALT 13 (L) 07/04/2017 2139   ALT 18 06/02/2014 1004   ALKPHOS 63 07/04/2017 2139   ALKPHOS 69 06/02/2014 1004   BILITOT 0.2 (L)  07/04/2017 2139   BILITOT 0.36 06/02/2014 1004   GFRNONAA >60 07/25/2017 1137   GFRAA >60 07/25/2017 1137    Lab Results  Component Value Date   CEA1 3.91 05/16/2017    Medications: I have reviewed the patient's current medications.   Assessment/Plan: 1. Rectal cancer, clinical stage III (T3 N1) status post biopsy of a rectal mass on 05/24/2013 confirming adenocarcinoma. Endoscopic ultrasound 05/31/2013 measured the mass at 11 cm from the anal verge, uT3uN1.  Initiation of radiation and concurrent Xeloda 06/18/2013; completion 07/26/2013.   Status post low anterior resection 09/02/2013. Invasive adenocarcinoma, 3.7 cm, negative margins; metastatic carcinoma in 7 of 11 lymph nodes; extensive lymph vascular involvement by tumor.MSI-stable, no loss of mismatch repair protein expression  Cycle 1 adjuvant FOLFOX 10/15/2013.   Cycle 2 adjuvant FOLFOX 10/29/2013.   Cycle 3 adjuvant FOLFOX 11/14/2013.   Cycle 4 adjuvant FOLFOX 11/27/2013.   Cycle 5 adjuvant FOLFOX 12/10/2013   Cycle 6 adjuvant FOLFOX 12/24/2013.   Cycle 7 adjuvant FOLFOX 01/14/2014.   Cycle 8 adjuvant FOLFOX 01/28/2014   Cycle 9 adjuvant FOLFOX 02/11/2014 (oxaliplatin dose reduced secondary to thrombocytopenia)   Surveillance CT scans 06/02/2014 with no evidence of recurrent rectal cancer  surveillance CT scans 06/16/2015 with no evidence of recurrent rectal cancer, 10 mm right external iliac node ,  increased amount of loculated air associated with the presacral soft tissue thickening , new left hydronephrosis  Surveillance CT scans 05/06/2016-negative for recurrent rectal cancer, decreased right external iliac node, persistent left hydronephrosis  CT abdomen/pelvis 07/05/2017-new left para-aortic, aortocaval, right common iliac and right external iliac lymphadenopathy.  CT neck/chest 07/13/2017- enlarged left supraclavicular nodes, new paratracheal nodes  Ultrasound-guided biopsy of a left  supraclavicular lymph node 07/25/2017- metastatic adenocarcinoma consistent with colorectal cancer 2. Post colonoscopy bowel perforation confirmed on abdomen CT 05/28/2013. 3. Single indeterminate liver lesion on the abdomen CT 05/28/2013. MRI on 06/18/2013 confirmed 2 tiny liver lesions, indeterminate-favored to be benign cysts. 4. History of cervical and lumbar disc disease.  5. History of HELLP syndrome with first pregnancy.  6. Delayed nausea following cycle 1 FOLFOX. She receives Aloxi and Emend. She continues to experience delayed nausea. Prophylactic Decadron added with cycle 4. 7. History of pain with bowel movements. 8. Anastomotic leak on CT 09/13/2013. Status post exploratory laparotomy with drainage of a pelvic abscess, diverting loop ileostomy. Drainage catheter removed 10/23/2013.  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 Perineal numbness, urinary retention. The urinary retention has improved. 10. Perineal pain-improved. 11. Neutropenia secondary chemotherapy-she received Neulasta. 12. Urinary tract infection, Escherichia coli-11/03/2013. 13. Venous engorgement at the left anterior chest and left arm with swelling of the left arm and hand. Venous Doppler 11/14/2013 positive for DVT. She began Lovenox 11/14/2013 and was switched to xarelto 12/10/2013. Xarelto discontinued early January 2016. 14. Fever 11/20/2013. Blood and urine cultures negative. The fever resolved within 24 hours. She continued to have a single episode of fever following chemotherapy 15. History of a herpetic lip lesion 16. 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 17. Escherichia coli bacteremia, urinary source, 01/14/2014. She completed a 10 day course of ceftriaxone. 34. 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 Anxiety/depression. 20. Port-A-Cath  removal 06/09/2014. 21. Escherichia coli urinary tract infection 06/24/2014. She completed a course of ciprofloxacin. 22. frequent bowel movements following surgery/radiation -status post placement of a sacral stimulator 11/21/2014 23. CT 06/16/2015 with new left hydronephrosis status post cystoscopy with placement of a double-J stent 07/17/2015. No tumors or stones were seen in the bladder. Both ureteral orifices were unremarkable. On the left side retrograde study was performed. The distal ureter was narrowed and then a little bit more dilated proximally with hydronephrosis noted. There was no evidence of intramural tumor but the area did seem a bit strictured.  Stent removed, persistent hydronephrosis, followed by Dr. Amalia Hailey  CT 05/06/2016-increased left hydronephrosis  Ultrasound 09/09/2016-stable left hydroureteronephrosis  Left nephrectomy 04/28/2017  24.CT abdomen/pelvis 07/05/2017-new left para-aortic, aortocaval, right common iliac and right external iliac lymphadenopathy.   Disposition: [name] has been diagnosed with metastatic rectal cancer.  I discussed treatment options with her again today.  We are waiting on the results of Foundation 1 testing.  We were told to expect a result date of 08/28/2017.  We discussed standard FOLFIRI/Avastin, the genotype directed irinotecan dosing study, and FOLFIRI/panitumumab.  She is concerned about the skin toxicity associated with panitumumab.  She understands the survival benefit associated with the use of panitumumab in patients with left-sided colorectal cancer and RAS wild-type tumors.  She prefers proceeding with FOLFIRI/Avastin on the [facility] study.  She will consider treatment with panitumumab in the future.  We reviewed potential toxicities associated with the FOLFIRI regimen including the chance for nausea/vomiting, mucositis, diarrhea, alopecia, and hematologic  toxicity.  We discussed the sun sensitivity, hyperpigmentation, and  hand/foot syndrome associated with 5-fluorouracil.  We reviewed the acute and delayed diarrhea associated with irinotecan.  She understands the potential for increased toxicity if she receives a higher than standard dose of irinotecan.  We discussed the bleeding, thromboembolic disease, bowel perforation, hypertension, renal toxicity, and CNS toxicity associated with Avastin.  She met with a research nurse today and signed consent for the [facility] 1317 study.  A peripheral blood sample was submitted for genotype testing.  She will be referred for placement of a Port-A-Cath with the plan to begin a first cycle of treatment on 08/28/2017.  50 minutes were spent with the patient today.  The majority of the time was used for counseling and coordination of care.  Betsy Coder, MD  08/17/2017  5:58 PM

## 2017-08-18 ENCOUNTER — Other Ambulatory Visit: Payer: Self-pay | Admitting: Radiology

## 2017-08-18 ENCOUNTER — Telehealth: Payer: Self-pay | Admitting: Oncology

## 2017-08-18 ENCOUNTER — Telehealth: Payer: Self-pay | Admitting: *Deleted

## 2017-08-18 LAB — RESEARCH LABS

## 2017-08-18 NOTE — Telephone Encounter (Signed)
Unable to schedule 2/28 los - due to capped day - logged - will contact patient when appts are scheduled.

## 2017-08-18 NOTE — Telephone Encounter (Signed)
Call from [place] in [place] at [facility]. They are unable to schedule port before 3/14. Called [facility]. Port placement scheduled for 3/6. Arrive at 0830. NPO after midnight. AM meds with water only. Pt notified, voiced understanding.

## 2017-08-21 ENCOUNTER — Telehealth: Payer: Self-pay | Admitting: Oncology

## 2017-08-21 NOTE — Telephone Encounter (Signed)
Scheduled appt per 2/28 los - patient is aware of appt date and time.

## 2017-08-22 ENCOUNTER — Other Ambulatory Visit: Payer: Self-pay | Admitting: Physician Assistant

## 2017-08-23 ENCOUNTER — Encounter (HOSPITAL_COMMUNITY): Payer: Self-pay

## 2017-08-23 ENCOUNTER — Ambulatory Visit (HOSPITAL_COMMUNITY)
Admission: RE | Admit: 2017-08-23 | Discharge: 2017-08-23 | Disposition: A | Discharge status: Home / Self Care | Payer: 59 | Source: Ambulatory Visit | Attending: Oncology | Admitting: Oncology

## 2017-08-23 DIAGNOSIS — C20 Malignant neoplasm of rectum: Secondary | ICD-10-CM

## 2017-08-24 ENCOUNTER — Telehealth: Payer: Self-pay | Admitting: *Deleted

## 2017-08-24 ENCOUNTER — Other Ambulatory Visit: Payer: Self-pay | Admitting: Radiology

## 2017-08-24 NOTE — Telephone Encounter (Signed)
Informed pt and husband that Dr. Benay Spice has received/ reviewed Foundation One results and she is a candidate for panitumimab in the future. Husband voiced understanding.

## 2017-08-25 ENCOUNTER — Ambulatory Visit (HOSPITAL_COMMUNITY)
Admission: RE | Admit: 2017-08-25 | Discharge: 2017-08-25 | Disposition: A | Discharge status: Home / Self Care | Payer: 59 | Source: Ambulatory Visit | Attending: Oncology | Admitting: Oncology

## 2017-08-25 ENCOUNTER — Other Ambulatory Visit: Payer: Self-pay | Admitting: Oncology

## 2017-08-25 ENCOUNTER — Encounter (HOSPITAL_COMMUNITY): Payer: Self-pay | Admitting: *Deleted

## 2017-08-25 DIAGNOSIS — C20 Malignant neoplasm of rectum: Secondary | ICD-10-CM | POA: Diagnosis present

## 2017-08-25 DIAGNOSIS — Z86718 Personal history of other venous thrombosis and embolism: Secondary | ICD-10-CM | POA: Insufficient documentation

## 2017-08-25 DIAGNOSIS — M1990 Unspecified osteoarthritis, unspecified site: Secondary | ICD-10-CM | POA: Diagnosis not present

## 2017-08-25 DIAGNOSIS — Z975 Presence of (intrauterine) contraceptive device: Secondary | ICD-10-CM | POA: Diagnosis not present

## 2017-08-25 DIAGNOSIS — C50919 Malignant neoplasm of unspecified site of unspecified female breast: Secondary | ICD-10-CM | POA: Insufficient documentation

## 2017-08-25 DIAGNOSIS — F419 Anxiety disorder, unspecified: Secondary | ICD-10-CM | POA: Diagnosis not present

## 2017-08-25 DIAGNOSIS — I10 Essential (primary) hypertension: Secondary | ICD-10-CM | POA: Insufficient documentation

## 2017-08-25 HISTORY — PX: IR US GUIDE VASC ACCESS RIGHT: IMG2390

## 2017-08-25 HISTORY — PX: IR FLUORO GUIDE PORT INSERTION RIGHT: IMG5741

## 2017-08-25 LAB — PROTIME-INR
INR: 0.97
Prothrombin Time: 12.8 seconds (ref 11.4–15.2)

## 2017-08-25 LAB — BASIC METABOLIC PANEL
Anion gap: 8 (ref 5–15)
BUN: 15 mg/dL (ref 6–20)
CHLORIDE: 107 mmol/L (ref 101–111)
CO2: 25 mmol/L (ref 22–32)
CREATININE: 0.95 mg/dL (ref 0.44–1.00)
Calcium: 8.9 mg/dL (ref 8.9–10.3)
GFR calc Af Amer: >60 mL/min (ref >60)
GLUCOSE: 92 mg/dL (ref 65–99)
POTASSIUM: 3.6 mmol/L (ref 3.5–5.1)
SODIUM: 140 mmol/L (ref 135–145)

## 2017-08-25 LAB — CBC
HEMATOCRIT: 37.9 % (ref 36.0–46.0)
Hemoglobin: 12.3 g/dL (ref 12.0–15.0)
MCH: 29.1 pg (ref 26.0–34.0)
MCHC: 32.5 g/dL (ref 30.0–36.0)
MCV: 89.8 fL (ref 78.0–100.0)
PLATELETS: 223 10*3/uL (ref 150–400)
RBC: 4.22 MIL/uL (ref 3.87–5.11)
RDW: 13 % (ref 11.5–15.5)
WBC: 4.7 10*3/uL (ref 4.0–10.5)

## 2017-08-25 MED ORDER — MIDAZOLAM HCL 2 MG/2ML IJ SOLN
INTRAMUSCULAR | Status: AC | PRN
  Administered 2017-08-25 (×3): 1 mg via INTRAVENOUS

## 2017-08-25 MED ORDER — MIDAZOLAM HCL 2 MG/2ML IJ SOLN
INTRAMUSCULAR | Status: AC
  Filled 2017-08-25: qty 6

## 2017-08-25 MED ORDER — LIDOCAINE HCL 1 % IJ SOLN
INTRAMUSCULAR | Status: AC
  Filled 2017-08-25: qty 20

## 2017-08-25 MED ORDER — HEPARIN SOD (PORK) LOCK FLUSH 100 UNIT/ML IV SOLN
INTRAVENOUS | Status: AC
  Filled 2017-08-25: qty 5

## 2017-08-25 MED ORDER — CEFAZOLIN SODIUM-DEXTROSE 2-4 GM/100ML-% IV SOLN
INTRAVENOUS | Status: AC
  Administered 2017-08-25: 2 g via INTRAVENOUS
  Filled 2017-08-25: qty 100

## 2017-08-25 MED ORDER — FENTANYL CITRATE (PF) 100 MCG/2ML IJ SOLN
INTRAMUSCULAR | Status: AC | PRN
  Administered 2017-08-25 (×3): 50 ug via INTRAVENOUS

## 2017-08-25 MED ORDER — LIDOCAINE-EPINEPHRINE 1 %-1:100000 IJ SOLN
INTRAMUSCULAR | Status: AC | PRN
  Administered 2017-08-25: 10 mL

## 2017-08-25 MED ORDER — FENTANYL CITRATE (PF) 100 MCG/2ML IJ SOLN
INTRAMUSCULAR | Status: AC
  Filled 2017-08-25: qty 4

## 2017-08-25 MED ORDER — CEFAZOLIN SODIUM-DEXTROSE 2-4 GM/100ML-% IV SOLN
2.0000 g | INTRAVENOUS | Status: AC
  Administered 2017-08-25: 2 g via INTRAVENOUS

## 2017-08-25 MED ORDER — SODIUM CHLORIDE 0.9 % IV SOLN: INTRAVENOUS | Status: DC

## 2017-08-25 MED ORDER — LIDOCAINE-EPINEPHRINE (PF) 1 %-1:200000 IJ SOLN
INTRAMUSCULAR | Status: AC
  Filled 2017-08-25: qty 30

## 2017-08-25 NOTE — Sedation Documentation (Signed)
Patient is resting comfortably. 

## 2017-08-25 NOTE — H&P (Signed)
Chief Complaint: Patient was seen in consultation today for rectal cancer  Referring Physician(s): Betsy Coder B  Supervising Physician: Arne Cleveland  Patient Status: [facility] - Out-pt  History of Present Illness: [name] is a 57 y.o. female with past medical history of anxiety, arthritis, colon cancer, DVT, and rectal cancer who has plans for upcoming chemotherapy.  IR consulted for Port-A-Cath placement at the request of Dr. Benay Spice.   Patients to radiology today in her usual state of health.  She has been NPO.  She does not take blood thinners.   She states she has had a previous Port-A-Cath placed on the left.   Past Medical History:  Diagnosis Date  . Anxiety   . Arthritis 08-27-13   at present has ruptured disc- lower back-not an issue now  . Blood infection ([place])    E Coli after UTI   . Colon cancer ([place]) 08-27-13   radiation /chemo -last ended 4 weeks(Dr. Benay Spice)  . DVT (deep venous thrombosis) ([place])    left upper extremity currently has stopped xarelto   . History of radiation therapy 06/18/13-07/26/13   rectal 50.4Gy total dose  . Hypertension    toxemia with pregnancy 1990  . IUD (intrauterine device) in place 08-27-13   Mirena Implant inplace  . Nerve damage    numbness in pelvic area from rectal rection straining to empty bladder   . PONV (postoperative nausea and vomiting)    scopalamine patch helped  . Stress incontinence   . UTI (lower urinary tract infection)    hx of     Past Surgical History:  Procedure Laterality Date  . APPENDECTOMY    . BACK SURGERY    . bladder study      pressure readings had approx 1.5 months ago   . BREAST SURGERY     reduction surgery  . CERVICAL DISC ARTHROPLASTY  01/03/2012   Procedure: CERVICAL ANTERIOR DISC ARTHROPLASTY;  Surgeon: Charlie Pitter, MD;  Location: [facility];  Service: Neurosurgery;  Laterality: N/A;  Cervical Anterior Disc Arthorplasty [place]  . CESAREAN SECTION     x2  . EUS N/A  05/31/2013   Procedure: LOWER ENDOSCOPIC ULTRASOUND (EUS);  Surgeon: Beryle Beams, MD;  Location: [facility];  Service: Endoscopy;  Laterality: N/A;  . ILEO LOOP COLOSTOMY CLOSURE N/A 05/02/2014   Procedure: LAPAROSCOPIC ASSISTED REVERSAL ILEOSTOMY AND LYSIS OF ADHESIONS;  Surgeon: [name], MD;  Location: [facility];  Service: General;  Laterality: N/A;  . INTRAUTERINE DEVICE INSERTION  2006   [place]  . KNEE ARTHROSCOPY Left 08-27-13   torn meniscus repair left   . LAPAROSCOPIC LOW ANTERIOR RESECTION N/A 09/02/2013   Procedure: LAPAROSCOPIC LOW ANTERIOR RESECTION;  Surgeon: [name], MD;  Location: [facility];  Service: General;  Laterality: N/A;  . LAPAROTOMY N/A 09/13/2013   Procedure: EXPLORATORY LAPAROTOMY / FLEXIBLE SIGMOIDOSCOPY/  DRAINAGE OF PELVIC ABSCESS WITH  DIVERTING  LOOP  ILEOSTOMY;  Surgeon: [name], MD;  Location: [facility];  Service: General;  Laterality: N/A;  . LYSIS OF ADHESION  05/02/2014   Procedure: LYSIS OF ADHESION;  Surgeon: [name], MD;  Location: [facility];  Service: General;;  . PORT-A-CATH REMOVAL Left 06/09/2014   Procedure: REMOVAL PORT-A-CATH  (MINOR PROCEDURE) ;  Surgeon: [name], MD;  Location: [facility];  Service: General;  Laterality: Left;  . PORTACATH PLACEMENT Left 10/11/2013   Procedure: INSERTION PORT-A-CATH;  Surgeon: [name], MD;  Location: [facility];  Service: General;  Laterality: Left;  . REFRACTIVE SURGERY      Allergies: Patient has no known allergies.  Medications: Prior to Admission medications   Medication Sig Start Date End Date Taking? Authorizing Provider  acetaminophen (TYLENOL) 500 MG tablet Take 1,000 mg by mouth every 6 (six) hours as needed for mild pain.   Yes [provider]  acyclovir (ZOVIRAX) 400 MG tablet Take 1 tablet (400 mg total) by mouth 2 (two) times daily as needed. 12/02/16  Yes Ladell Pier, MD  ALPRAZolam Duanne Moron) 0.5 MG tablet Take 1 tablet (0.5 mg total) by mouth at  bedtime as needed for anxiety. 07/26/17  Yes Ladell Pier, MD  MINIVELLE 0.075 MG/24HR Place 1 patch onto the skin 2 (two) times a week.  10/23/15  Yes [provider]  Multiple Vitamin (MULTIVITAMIN WITH MINERALS) TABS tablet Take 1 tablet by mouth daily.   Yes [provider]  Pancrelipase, Lip-Prot-Amyl, (ZENPEP) 40000 units CPEP Take 40,000 Units by mouth 3 (three) times daily with meals. May take up to 4 times 11/21/14  Yes [provider]  progesterone (PROMETRIUM) 100 MG capsule Take 100 mg by mouth at bedtime.  10/29/15  Yes [provider]  sertraline (ZOLOFT) 100 MG tablet Take 150 mg by mouth daily.  06/09/15  Yes [provider]     No family history on file.  Social History   Socioeconomic History  . Marital status: Married    Spouse name: Not on file  . Number of children: Not on file  . Years of education: Not on file  . Highest education level: Not on file  Social Needs  . Financial resource strain: Not on file  . Food insecurity - worry: Not on file  . Food insecurity - inability: Not on file  . Transportation needs - medical: Not on file  . Transportation needs - non-medical: Not on file  Occupational History  . Not on file  Tobacco Use  . Smoking status: Never Smoker  . Smokeless tobacco: Never Used  Substance and Sexual Activity  . Alcohol use: Yes    Comment: weekends   . Drug use: No  . Sexual activity: Yes    Birth control/protection: IUD  Other Topics Concern  . Not on file  Social History Narrative  . Not on file     Review of Systems: A 12 point ROS discussed and pertinent positives are indicated in the HPI above.  All other systems are negative.  Review of Systems  Constitutional: Negative for fatigue and fever.  Respiratory: Negative for cough and shortness of breath.   Cardiovascular: Negative for chest pain.  Gastrointestinal: Negative for abdominal pain.  Psychiatric/Behavioral: Negative for  behavioral problems and confusion.    Vital Signs: BP 107/77   Pulse 75   Temp 98.8 F (37.1 C)   Resp 20   Ht 5\' 4"  (1.626 m)   Wt 155 lb (70.3 kg)   SpO2 97%   BMI 26.61 kg/m   Physical Exam  Constitutional: She is oriented to person, place, and time. She appears well-developed.  Cardiovascular: Normal rate, regular rhythm and normal heart sounds.  Pulmonary/Chest: Effort normal and breath sounds normal. No respiratory distress.  Abdominal: Soft.  Neurological: She is alert and oriented to person, place, and time.  Skin: Skin is warm and dry.  Psychiatric: She has a normal mood and affect. Her behavior is normal. Judgment and thought content normal.  Nursing note and vitals reviewed.  MD Evaluation Airway: WNL Heart: WNL Abdomen: WNL Chest/ Lungs: WNL ASA  Classification: 3 Mallampati/Airway Score: One   Imaging: No results found.  Labs:  CBC: Recent Labs    07/04/17 2139 07/25/17 1137 08/25/17 0841  WBC 6.5 3.8* 4.7  HGB 13.1 12.1 12.3  HCT 38.8 36.7 37.9  PLT 259 217 223    COAGS: Recent Labs    07/25/17 1137  INR 0.95    BMP: Recent Labs    11/08/16 1205 07/04/17 2139 07/25/17 1137 08/25/17 0841  NA 138 138 139 140  K 4.2 3.1* 3.5 3.6  CL  --  102 105 107  CO2 26 26 27 25   GLUCOSE 90 119* 91 92  BUN 23.0 26* 18 15  CALCIUM 9.6 9.4 8.4* 8.9  CREATININE 1.0 0.90 0.87 0.95  GFRNONAA  --  >60 >60 >60  GFRAA  --  >60 >60 >60    LIVER FUNCTION TESTS: Recent Labs    07/04/17 2139  BILITOT 0.2*  AST 18  ALT 13*  ALKPHOS 63  PROT 7.6  ALBUMIN 3.9    TUMOR MARKERS: No results for input(s): AFPTM, CEA, CA199, CHROMGRNA in the last 8760 hours.  Assessment and Plan: Patient with past medical history of colon and rectal cancer presents with plans for upcoming chemotherapy.  IR consulted for Port-A-Cath placement at the request of Dr. Benay Spice. Patient presents today in their usual state of health.  She has been NPO and is not  currently on blood thinners.  She did have a previous Port that was placed on the left.  She states she did have issues with left-sided Port- was "finicky" and ultimately developed a LUE DVT.  Port was ultimately removed.   Risks and benefits of image guided port-a-catheter placement was discussed with the patient including, but not limited to bleeding, infection, pneumothorax, or fibrin sheath development and need for additional procedures.  All of the patient's questions were answered, patient is agreeable to proceed. Consent signed and in chart.  Thank you for this interesting consult.  I greatly enjoyed meeting [name] and look forward to participating in their care.  A copy of this report was sent to the requesting provider on this date.  Electronically Signed: Docia Barrier, PA 08/25/2017, 9:37 AM   I spent a total of  30 Minutes   in face to face in clinical consultation, greater than 50% of which was counseling/coordinating care for rectal cancer.

## 2017-08-25 NOTE — Discharge Instructions (Signed)
Implanted Port Insertion, Care After °This sheet gives you information about how to care for yourself after your procedure. Your health care provider may also give you more specific instructions. If you have problems or questions, contact your health care provider. °What can I expect after the procedure? °After your procedure, it is common to have: °· Discomfort at the port insertion site. °· Bruising on the skin over the port. This should improve over 3-4 days. ° °Follow these instructions at home: °Port care °· After your port is placed, you will get a manufacturer's information card. The card has information about your port. Keep this card with you at all times. °· Take care of the port as told by your health care provider. Ask your health care provider if you or a family member can get training for taking care of the port at home. A home health care nurse may also take care of the port. °· Make sure to remember what type of port you have. °Incision care °· Follow instructions from your health care provider about how to take care of your port insertion site. Make sure you: °? Wash your hands with soap and water before you change your bandage (dressing). If soap and water are not available, use hand sanitizer. °? Change your dressing as told by your health care provider. °? Leave stitches (sutures), skin glue, or adhesive strips in place. These skin closures may need to stay in place for 2 weeks or longer. If adhesive strip edges start to loosen and curl up, you may trim the loose edges. Do not remove adhesive strips completely unless your health care provider tells you to do that. °· Check your port insertion site every day for signs of infection. Check for: °? More redness, swelling, or pain. °? More fluid or blood. °? Warmth. °? Pus or a bad smell. °General instructions °· Do not take baths, swim, or use a hot tub until your health care provider approves. °· Do not lift anything that is heavier than 10 lb (4.5  kg) for a week, or as told by your health care provider. °· Ask your health care provider when it is okay to: °? Return to work or school. °? Resume usual physical activities or sports. °· Do not drive for 24 hours if you were given a medicine to help you relax (sedative). °· Take over-the-counter and prescription medicines only as told by your health care provider. °· Wear a medical alert bracelet in case of an emergency. This will tell any health care providers that you have a port. °· Keep all follow-up visits as told by your health care provider. This is important. °Contact a health care provider if: °· You cannot flush your port with saline as directed, or you cannot draw blood from the port. °· You have a fever or chills. °· You have more redness, swelling, or pain around your port insertion site. °· You have more fluid or blood coming from your port insertion site. °· Your port insertion site feels warm to the touch. °· You have pus or a bad smell coming from the port insertion site. °Get help right away if: °· You have chest pain or shortness of breath. °· You have bleeding from your port that you cannot control. °Summary °· Take care of the port as told by your health care provider. °· Change your dressing as told by your health care provider. °· Keep all follow-up visits as told by your health care provider. °  This information is not intended to replace advice given to you by your health care provider. Make sure you discuss any questions you have with your health care provider. °Document Released: 03/27/2013 Document Revised: 04/27/2016 Document Reviewed: 04/27/2016 °Elsevier Interactive Patient Education © 2017 [employer] ° °

## 2017-08-25 NOTE — Sedation Documentation (Signed)
02 2L applied for procedure  

## 2017-08-25 NOTE — Procedures (Signed)
  Procedure: R IJ PowerPort   EBL:   minimal Complications:  none immediate  See full dictation in [facility] PACS.  D. Zackerie Sara MD Main # [phone] Pager  [phone]    

## 2017-08-27 ENCOUNTER — Other Ambulatory Visit: Payer: Self-pay | Admitting: Oncology

## 2017-08-27 NOTE — Progress Notes (Signed)
START ON PATHWAY REGIMEN - Colorectal     A cycle is every 14 days:     Irinotecan      Leucovorin      5-Fluorouracil      5-Fluorouracil      Bevacizumab   **Always confirm dose/schedule in your pharmacy ordering system**    Patient Characteristics: Metastatic Colorectal, First Line, Nonsurgical Candidate, KRAS/NRAS Wild-Type, BRAF Wild-Type/Unknown, PS = 0,1; Bevacizumab Eligible Current evidence of distant metastases<= Yes AJCC T Category: Staged < 8th Ed. AJCC N Category: Staged < 8th Ed. AJCC M Category: Staged < 8th Ed. AJCC 8 Stage Grouping: Staged < 8th Ed. BRAF Mutation Status: Wild Type (no mutation) KRAS/NRAS Mutation Status: Wild Type (no mutation) Line of therapy: First Line Would you be surprised if this patient died  in the next year<= I would be surprised if this patient died in the next year Performance Status: PS = 0, 1 Bevacizumab Eligibility: Eligible Intent of Therapy: Non-Curative / Palliative Intent, Discussed with Patient

## 2017-08-28 ENCOUNTER — Other Ambulatory Visit: Payer: Self-pay | Admitting: Nurse Practitioner

## 2017-08-28 DIAGNOSIS — C20 Malignant neoplasm of rectum: Secondary | ICD-10-CM

## 2017-08-29 ENCOUNTER — Inpatient Hospital Stay: Payer: 59

## 2017-08-29 ENCOUNTER — Inpatient Hospital Stay: Payer: 59 | Attending: Nurse Practitioner | Admitting: Nurse Practitioner

## 2017-08-29 ENCOUNTER — Encounter: Payer: Self-pay | Admitting: Nurse Practitioner

## 2017-08-29 ENCOUNTER — Telehealth: Payer: Self-pay | Admitting: Nurse Practitioner

## 2017-08-29 VITALS — BP 117/78 | HR 80 | Temp 98.4°F | Resp 17 | Ht 64.0 in | Wt 154.1 lb

## 2017-08-29 DIAGNOSIS — Z5111 Encounter for antineoplastic chemotherapy: Secondary | ICD-10-CM | POA: Diagnosis not present

## 2017-08-29 DIAGNOSIS — C770 Secondary and unspecified malignant neoplasm of lymph nodes of head, face and neck: Secondary | ICD-10-CM | POA: Insufficient documentation

## 2017-08-29 DIAGNOSIS — C20 Malignant neoplasm of rectum: Secondary | ICD-10-CM

## 2017-08-29 DIAGNOSIS — Z452 Encounter for adjustment and management of vascular access device: Secondary | ICD-10-CM | POA: Diagnosis not present

## 2017-08-29 LAB — CMP (CANCER CENTER ONLY)
ALK PHOS: 74 U/L (ref 40–150)
ALT: 12 U/L (ref 0–55)
AST: 14 U/L (ref 5–34)
Albumin: 3.6 g/dL (ref 3.5–5.0)
Anion gap: 9 (ref 3–11)
BILIRUBIN TOTAL: 0.3 mg/dL (ref 0.2–1.2)
BUN: 17 mg/dL (ref 7–26)
CALCIUM: 9.5 mg/dL (ref 8.4–10.4)
CO2: 23 mmol/L (ref 22–29)
CREATININE: 1 mg/dL (ref 0.60–1.10)
Chloride: 106 mmol/L (ref 98–109)
Glucose, Bld: 85 mg/dL (ref 70–140)
Potassium: 4 mmol/L (ref 3.5–5.1)
Sodium: 138 mmol/L (ref 136–145)
TOTAL PROTEIN: 7.5 g/dL (ref 6.4–8.3)

## 2017-08-29 LAB — CBC WITH DIFFERENTIAL (CANCER CENTER ONLY)
BASOS ABS: 0 10*3/uL (ref 0.0–0.1)
BASOS PCT: 1 %
EOS ABS: 0.1 10*3/uL (ref 0.0–0.5)
EOS PCT: 3 %
HCT: 39.9 % (ref 34.8–46.6)
HEMOGLOBIN: 13.3 g/dL (ref 11.6–15.9)
LYMPHS ABS: 0.5 10*3/uL — AB (ref 0.9–3.3)
Lymphocytes Relative: 13 %
MCH: 29.4 pg (ref 25.1–34.0)
MCHC: 33.2 g/dL (ref 31.5–36.0)
MCV: 88.6 fL (ref 79.5–101.0)
Monocytes Absolute: 0.3 10*3/uL (ref 0.1–0.9)
Monocytes Relative: 9 %
NEUTROS PCT: 74 %
Neutro Abs: 2.9 10*3/uL (ref 1.5–6.5)
PLATELETS: 246 10*3/uL (ref 145–400)
RBC: 4.5 MIL/uL (ref 3.70–5.45)
RDW: 13.2 % (ref 11.2–14.5)
WBC: 3.9 10*3/uL (ref 3.9–10.3)

## 2017-08-29 LAB — CEA (IN HOUSE-CHCC): CEA (CHCC-In House): 5.14 ng/mL — ABNORMAL HIGH (ref 0.00–5.00)

## 2017-08-29 MED ORDER — IRINOTECAN HCL CHEMO INJECTION 100 MG/5ML
180.0000 mg/m2 | Freq: Once | INTRAVENOUS | Status: AC
  Administered 2017-08-29: 320 mg via INTRAVENOUS
  Filled 2017-08-29: qty 15

## 2017-08-29 MED ORDER — SODIUM CHLORIDE 0.9 % IV SOLN
2400.0000 mg/m2 | INTRAVENOUS | Status: DC
  Administered 2017-08-29: 4250 mg via INTRAVENOUS
  Filled 2017-08-29: qty 85

## 2017-08-29 MED ORDER — PALONOSETRON HCL INJECTION 0.25 MG/5ML
INTRAVENOUS | Status: AC
  Filled 2017-08-29: qty 5

## 2017-08-29 MED ORDER — SODIUM CHLORIDE 0.9 % IV SOLN
Freq: Once | INTRAVENOUS | Status: AC
  Administered 2017-08-29: 14:00:00 via INTRAVENOUS

## 2017-08-29 MED ORDER — HYDROCODONE-ACETAMINOPHEN 5-325 MG PO TABS: 1.0000 | ORAL_TABLET | Freq: Two times a day (BID) | ORAL | 0 refills | Status: DC | PRN

## 2017-08-29 MED ORDER — DEXAMETHASONE SODIUM PHOSPHATE 10 MG/ML IJ SOLN
10.0000 mg | Freq: Once | INTRAMUSCULAR | Status: AC
  Administered 2017-08-29: 10 mg via INTRAVENOUS

## 2017-08-29 MED ORDER — SODIUM CHLORIDE 0.9 % IV SOLN: 10.0000 mg | Freq: Once | INTRAVENOUS | Status: DC

## 2017-08-29 MED ORDER — PROCHLORPERAZINE MALEATE 10 MG PO TABS: 10.0000 mg | ORAL_TABLET | Freq: Four times a day (QID) | ORAL | 2 refills | Status: DC | PRN

## 2017-08-29 MED ORDER — DEXAMETHASONE SODIUM PHOSPHATE 10 MG/ML IJ SOLN
INTRAMUSCULAR | Status: AC
  Filled 2017-08-29: qty 1

## 2017-08-29 MED ORDER — FLUOROURACIL CHEMO INJECTION 2.5 GM/50ML
400.0000 mg/m2 | Freq: Once | INTRAVENOUS | Status: AC
  Administered 2017-08-29: 700 mg via INTRAVENOUS
  Filled 2017-08-29: qty 14

## 2017-08-29 MED ORDER — ATROPINE SULFATE 1 MG/ML IJ SOLN
0.5000 mg | Freq: Once | INTRAMUSCULAR | Status: AC | PRN
  Administered 2017-08-29: 0.5 mg via INTRAVENOUS

## 2017-08-29 MED ORDER — LIDOCAINE-PRILOCAINE 2.5-2.5 % EX CREA: TOPICAL_CREAM | CUTANEOUS | 2 refills | Status: DC

## 2017-08-29 MED ORDER — ATROPINE SULFATE 1 MG/ML IJ SOLN
INTRAMUSCULAR | Status: AC
  Filled 2017-08-29: qty 1

## 2017-08-29 MED ORDER — DEXTROSE 5 % IV SOLN
400.0000 mg/m2 | Freq: Once | INTRAVENOUS | Status: AC
  Administered 2017-08-29: 712 mg via INTRAVENOUS
  Filled 2017-08-29: qty 35.6

## 2017-08-29 MED ORDER — PALONOSETRON HCL INJECTION 0.25 MG/5ML
0.2500 mg | Freq: Once | INTRAVENOUS | Status: AC
Start: 2017-08-29 — End: 2017-08-29
  Administered 2017-08-29: 0.25 mg via INTRAVENOUS

## 2017-08-29 NOTE — Progress Notes (Addendum)
[facility] OFFICE PROGRESS NOTE   Diagnosis: Rectal cancer  INTERVAL HISTORY:   [name] returns as scheduled.  She denies nausea/vomiting.  Bowels moving.  Overall good appetite.  She reports her weight is stable.  For the past several nights she has had abdominal pain when she lays down.  She tried Tylenol with no relief.  She then tried hydrocodone which relieved the pain.  As noted above bowels moving regularly.  She denies any urinary symptoms.  Objective:  Vital signs in last 24 hours:  Blood pressure 117/78, pulse 80, temperature 98.4 F (36.9 C), temperature source Oral, resp. rate 17, height _0  (1.626 m), weight 154 lb 1.6 oz (69.9 kg), SpO2 98 %.    HEENT: No thrush or ulcers. Resp: Lungs clear bilaterally. Cardio: Regular rate and rhythm. GI: Abdomen soft and nontender.  No hepatomegaly. Vascular: No leg edema. Neuro: Alert and oriented. Skin: Ecchymosis, resolving, right chest/breast. Port-A-Cath without erythema.   Lab Results:  Lab Results  Component Value Date   WBC 3.9 08/29/2017   HGB 12.3 08/25/2017   HCT 39.9 08/29/2017   MCV 88.6 08/29/2017   PLT 246 08/29/2017   NEUTROABS 2.9 08/29/2017    Imaging:  No results found.  Medications: I have reviewed the patient's current medications.  Assessment/Plan: 1. Rectal cancer, clinical stage III (T3 N1) status post biopsy of a rectal mass on 05/24/2013 confirming adenocarcinoma. Endoscopic ultrasound 05/31/2013 measured the mass at 11 cm from the anal verge, uT3uN1.  Initiation of radiation and concurrent Xeloda 06/18/2013; completion 07/26/2013.   Status post low anterior resection 09/02/2013. Invasive adenocarcinoma, 3.7 cm, negative margins; metastatic carcinoma in 7 of 11 lymph nodes; extensive lymph vascular involvement by tumor.MSI-stable, no loss of mismatch repair protein expression  Cycle 1 adjuvant FOLFOX 10/15/2013.   Cycle 2 adjuvant FOLFOX 10/29/2013.    Cycle 3 adjuvant FOLFOX 11/14/2013.   Cycle 4 adjuvant FOLFOX 11/27/2013.   Cycle 5 adjuvant FOLFOX 12/10/2013   Cycle 6 adjuvant FOLFOX 12/24/2013.   Cycle 7 adjuvant FOLFOX 01/14/2014.   Cycle 8 adjuvant FOLFOX 01/28/2014   Cycle 9 adjuvant FOLFOX 02/11/2014 (oxaliplatin dose reduced secondary to thrombocytopenia)   Surveillance CT scans 06/02/2014 with no evidence of recurrent rectal cancer  surveillance CT scans 06/16/2015 with no evidence of recurrent rectal cancer, 10 mm right external iliac node , increased amount of loculated air associated with the presacral soft tissue thickening , new left hydronephrosis  Surveillance CT scans 05/06/2016-negative for recurrent rectal cancer, decreased right external iliac node, persistent left hydronephrosis  CT abdomen/pelvis 07/05/2017-new left para-aortic, aortocaval, right common iliac and right external iliac lymphadenopathy.  CT neck/chest 07/13/2017- enlarged left supraclavicular nodes, new paratracheal nodes  Ultrasound-guided biopsy of a left supraclavicular lymph node 07/25/2017- metastatic adenocarcinoma consistent with colorectal cancer 2. Post colonoscopy bowel perforation confirmed on abdomen CT 05/28/2013. 3. Single indeterminate liver lesion on the abdomen CT 05/28/2013. MRI on 06/18/2013 confirmed 2 tiny liver lesions, indeterminate-favored to be benign cysts. 4. History of cervical and lumbar disc disease.  5. History of HELLP syndrome with first pregnancy.  6. Delayed nausea following cycle 1 FOLFOX. She receives Aloxi and Emend. She continues to experience delayed nausea. Prophylactic Decadron added with cycle 4. 7. History of pain with bowel movements. 8. Anastomotic leak on CT 09/13/2013. Status post exploratory laparotomy with drainage of a pelvic abscess, diverting loop ileostomy. Drainage catheter removed 10/23/2013.  Ileostomy reversal 05/03/2014  CT 06/02/2014 with a persistent presacral gas  collection with  evidence of a fistulous communication with the rectum 9. Perineal numbness, urinary retention. The urinary retention has improved. 10. Perineal pain-improved. 11. Neutropenia secondary chemotherapy-she received Neulasta. 12. Urinary tract infection, Escherichia coli-11/03/2013. 13. Venous engorgement at the left anterior chest and left arm with swelling of the left arm and hand. Venous Doppler 11/14/2013 positive for DVT. She began Lovenox 11/14/2013 and was switched to xarelto 12/10/2013. Xarelto discontinued early January 2016. 14. Fever 11/20/2013. Blood and urine cultures negative. The fever resolved within 24 hours. She continued to have a single episode of fever following chemotherapy 15. History of a herpetic lip lesion 16. 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 17. Escherichia coli bacteremia, urinary source, 01/14/2014. She completed a 10 day course of ceftriaxone. 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 Anxiety/depression. 20. Port-A-Cath removal 06/09/2014. 21. Escherichia coli urinary tract infection 06/24/2014. She completed a course of ciprofloxacin. 22. frequent bowel movements following surgery/radiation -status post placement of a sacral stimulator 11/21/2014 23. CT 06/16/2015 with new left hydronephrosis status post cystoscopy with placement of a double-J stent 07/17/2015. No tumors or stones were seen in the bladder. Both ureteral orifices were unremarkable. On the left side retrograde study was performed. The distal ureter was narrowed and then a little bit more dilated proximally with hydronephrosis noted. There was no evidence of intramural tumor but the area did seem a bit strictured.  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     Disposition: [name] appears stable.  The plan is to proceed with cycle 1 FOLFIRI today.  We again reviewed potential toxicities, questions answered.  We specifically discussed the potential for diarrhea with irinotecan.  She understands she should have Imodium on hand at home.  I sent a prescription to her pharmacy for EMLA cream and Compazine.  She is having abdominal pain at nighttime.  Question if related to abdominal/pelvic lymphadenopathy.  I gave her a prescription for hydrocodone 5/325 1 tablet every 12 hours as needed.  She will return for lab, follow-up and cycle 2 FOLFIRI in 2 weeks.  She will contact the office in the interim with any problems.    Ned Card ANP/GNP-BC   08/29/2017  12:13 PM  Addendum 3:55 PM-chemotherapy nurse reports patient had a dark stool.  I spoke with Ms. [place].  She reports a dark brown stool with a few areas of red discoloration.  Digital rectal exam was unremarkable.  Stool noted to be brown without visible blood.  Stool tested Hemoccult negative.  I questioned her about her diet yesterday.  She reports eating beets for lunch yesterday.  This may account for the stool discoloration.  She understands to contact the office with any signs of bleeding.  Ned Card

## 2017-08-29 NOTE — Telephone Encounter (Signed)
Gave patient avs and calendar with appts per 3/12 los.  °

## 2017-08-29 NOTE — Patient Instructions (Signed)
[place] Discharge Instructions for Patients Receiving Chemotherapy  Today you received the following chemotherapy agents: Irinotecan (Camptosar), Leucovorin, Fluorouracil (Adrucil, 5-FU).  To help prevent nausea and vomiting after your treatment, we encourage you to take your nausea medication as prescribed. Received Aloxi during treatment-->take Compazine (not Zofran) for the next 3 days as needed.  If you develop nausea and vomiting that is not controlled by your nausea medication, call the clinic.   BELOW ARE SYMPTOMS THAT SHOULD BE REPORTED IMMEDIATELY:  *FEVER GREATER THAN 100.5 F  *CHILLS WITH OR WITHOUT FEVER  NAUSEA AND VOMITING THAT IS NOT CONTROLLED WITH YOUR NAUSEA MEDICATION  *UNUSUAL SHORTNESS OF BREATH  *UNUSUAL BRUISING OR BLEEDING  TENDERNESS IN MOUTH AND THROAT WITH OR WITHOUT PRESENCE OF ULCERS  *URINARY PROBLEMS  *BOWEL PROBLEMS  UNUSUAL RASH Items with * indicate a potential emergency and should be followed up as soon as possible.  Feel free to call the clinic should you have any questions or concerns. The clinic phone number is [phone].  Please show the [place] at check-in to the Emergency Department and triage nurse.  Irinotecan injection What is this medicine? IRINOTECAN (ir in oh TEE kan ) is a chemotherapy drug. It is used to treat colon and rectal cancer. This medicine may be used for other purposes; ask your health care provider or pharmacist if you have questions. COMMON BRAND NAME(S): Camptosar What should I tell my health care provider before I take this medicine? They need to know if you have any of these conditions: -blood disorders -dehydration -diarrhea -infection (especially a virus infection such as chickenpox, cold sores, or herpes) -liver disease -low blood counts, like low white cell, platelet, or red cell counts -recent or ongoing radiation therapy -an unusual or allergic reaction to irinotecan,  sorbitol, other chemotherapy, other medicines, foods, dyes, or preservatives -pregnant or trying to get pregnant -breast-feeding How should I use this medicine? This drug is given as an infusion into a vein. It is administered in a hospital or clinic by a specially trained health care professional. Talk to your pediatrician regarding the use of this medicine in children. Special care may be needed. Overdosage: If you think you have taken too much of this medicine contact a poison control center or emergency room at once. NOTE: This medicine is only for you. Do not share this medicine with others. What if I miss a dose? It is important not to miss your dose. Call your doctor or health care professional if you are unable to keep an appointment. What may interact with this medicine? Do not take this medicine with any of the following medications: -atazanavir -certain medicines for fungal infections like itraconazole and ketoconazole -St. John's Wort This medicine may also interact with the following medications: -dexamethasone -diuretics -laxatives -medicines for seizures like carbamazepine, mephobarbital, phenobarbital, phenytoin, primidone -medicines to increase blood counts like filgrastim, pegfilgrastim, sargramostim -prochlorperazine -vaccines This list may not describe all possible interactions. Give your health care provider a list of all the medicines, herbs, non-prescription drugs, or dietary supplements you use. Also tell them if you smoke, drink alcohol, or use illegal drugs. Some items may interact with your medicine. What should I watch for while using this medicine? Your condition will be monitored carefully while you are receiving this medicine. You will need important blood work done while you are taking this medicine. This drug may make you feel generally unwell. This is not uncommon, as chemotherapy can affect healthy cells  as well as cancer cells. Report any side effects.  Continue your course of treatment even though you feel ill unless your doctor tells you to stop. In some cases, you may be given additional medicines to help with side effects. Follow all directions for their use. You may get drowsy or dizzy. Do not drive, use machinery, or do anything that needs mental alertness until you know how this medicine affects you. Do not stand or sit up quickly, especially if you are an older patient. This reduces the risk of dizzy or fainting spells. Call your doctor or health care professional for advice if you get a fever, chills or sore throat, or other symptoms of a cold or flu. Do not treat yourself. This drug decreases your body's ability to fight infections. Try to avoid being around people who are sick. This medicine may increase your risk to bruise or bleed. Call your doctor or health care professional if you notice any unusual bleeding. Be careful brushing and flossing your teeth or using a toothpick because you may get an infection or bleed more easily. If you have any dental work done, tell your dentist you are receiving this medicine. Avoid taking products that contain aspirin, acetaminophen, ibuprofen, naproxen, or ketoprofen unless instructed by your doctor. These medicines may hide a fever. Do not become pregnant while taking this medicine. Women should inform their doctor if they wish to become pregnant or think they might be pregnant. There is a potential for serious side effects to an unborn child. Talk to your health care professional or pharmacist for more information. Do not breast-feed an infant while taking this medicine. What side effects may I notice from receiving this medicine? Side effects that you should report to your doctor or health care professional as soon as possible: -allergic reactions like skin rash, itching or hives, swelling of the face, lips, or tongue -low blood counts - this medicine may decrease the number of white blood cells, red  blood cells and platelets. You may be at increased risk for infections and bleeding. -signs of infection - fever or chills, cough, sore throat, pain or difficulty passing urine -signs of decreased platelets or bleeding - bruising, pinpoint red spots on the skin, black, tarry stools, blood in the urine -signs of decreased red blood cells - unusually weak or tired, fainting spells, lightheadedness -breathing problems -chest pain -diarrhea -feeling faint or lightheaded, falls -flushing, runny nose, sweating during infusion -mouth sores or pain -pain, swelling, redness or irritation where injected -pain, swelling, warmth in the leg -pain, tingling, numbness in the hands or feet -problems with balance, talking, walking -stomach cramps, pain -trouble passing urine or change in the amount of urine -vomiting as to be unable to hold down drinks or food -yellowing of the eyes or skin Side effects that usually do not require medical attention (report to your doctor or health care professional if they continue or are bothersome): -constipation -hair loss -headache -loss of appetite -nausea, vomiting -stomach upset This list may not describe all possible side effects. Call your doctor for medical advice about side effects. You may report side effects to FDA at 1-800-FDA-1088. Where should I keep my medicine? This drug is given in a hospital or clinic and will not be stored at home. NOTE: This sheet is a summary. It may not cover all possible information. If you have questions about this medicine, talk to your doctor, pharmacist, or health care provider.  2018 Elsevier/Gold Standard (2012-12-03 16:29:32)  Leucovorin  injection What is this medicine? LEUCOVORIN (loo koe VOR in) is used to prevent or treat the harmful effects of some medicines. This medicine is used to treat anemia caused by a low amount of folic acid in the body. It is also used with 5-fluorouracil (5-FU) to treat colon  cancer. This medicine may be used for other purposes; ask your health care provider or pharmacist if you have questions. What should I tell my health care provider before I take this medicine? They need to know if you have any of these conditions: -anemia from low levels of vitamin B-12 in the blood -an unusual or allergic reaction to leucovorin, folic acid, other medicines, foods, dyes, or preservatives -pregnant or trying to get pregnant -breast-feeding How should I use this medicine? This medicine is for injection into a muscle or into a vein. It is given by a health care professional in a hospital or clinic setting. Talk to your pediatrician regarding the use of this medicine in children. Special care may be needed. Overdosage: If you think you have taken too much of this medicine contact a poison control center or emergency room at once. NOTE: This medicine is only for you. Do not share this medicine with others. What if I miss a dose? This does not apply. What may interact with this medicine? -capecitabine -fluorouracil -phenobarbital -phenytoin -primidone -trimethoprim-sulfamethoxazole This list may not describe all possible interactions. Give your health care provider a list of all the medicines, herbs, non-prescription drugs, or dietary supplements you use. Also tell them if you smoke, drink alcohol, or use illegal drugs. Some items may interact with your medicine. What should I watch for while using this medicine? Your condition will be monitored carefully while you are receiving this medicine. This medicine may increase the side effects of 5-fluorouracil, 5-FU. Tell your doctor or health care professional if you have diarrhea or mouth sores that do not get better or that get worse. What side effects may I notice from receiving this medicine? Side effects that you should report to your doctor or health care professional as soon as possible: -allergic reactions like skin rash,  itching or hives, swelling of the face, lips, or tongue -breathing problems -fever, infection -mouth sores -unusual bleeding or bruising -unusually weak or tired Side effects that usually do not require medical attention (report to your doctor or health care professional if they continue or are bothersome): -constipation or diarrhea -loss of appetite -nausea, vomiting This list may not describe all possible side effects. Call your doctor for medical advice about side effects. You may report side effects to FDA at 1-800-FDA-1088. Where should I keep my medicine? This drug is given in a hospital or clinic and will not be stored at home. NOTE: This sheet is a summary. It may not cover all possible information. If you have questions about this medicine, talk to your doctor, pharmacist, or health care provider.  2018 Elsevier/Gold Standard (2007-12-11 16:50:29)  Fluorouracil, 5-FU injection What is this medicine? FLUOROURACIL, 5-FU (flure oh YOOR a sil) is a chemotherapy drug. It slows the growth of cancer cells. This medicine is used to treat many types of cancer like breast cancer, colon or rectal cancer, pancreatic cancer, and stomach cancer. This medicine may be used for other purposes; ask your health care provider or pharmacist if you have questions. COMMON BRAND NAME(S): Adrucil What should I tell my health care provider before I take this medicine? They need to know if you have any of these  conditions: -blood disorders -dihydropyrimidine dehydrogenase (DPD) deficiency -infection (especially a virus infection such as chickenpox, cold sores, or herpes) -kidney disease -liver disease -malnourished, poor nutrition -recent or ongoing radiation therapy -an unusual or allergic reaction to fluorouracil, other chemotherapy, other medicines, foods, dyes, or preservatives -pregnant or trying to get pregnant -breast-feeding How should I use this medicine? This drug is given as an infusion  or injection into a vein. It is administered in a hospital or clinic by a specially trained health care professional. Talk to your pediatrician regarding the use of this medicine in children. Special care may be needed. Overdosage: If you think you have taken too much of this medicine contact a poison control center or emergency room at once. NOTE: This medicine is only for you. Do not share this medicine with others. What if I miss a dose? It is important not to miss your dose. Call your doctor or health care professional if you are unable to keep an appointment. What may interact with this medicine? -allopurinol -cimetidine -dapsone -digoxin -hydroxyurea -leucovorin -levamisole -medicines for seizures like ethotoin, fosphenytoin, phenytoin -medicines to increase blood counts like filgrastim, pegfilgrastim, sargramostim -medicines that treat or prevent blood clots like warfarin, enoxaparin, and dalteparin -methotrexate -metronidazole -pyrimethamine -some other chemotherapy drugs like busulfan, cisplatin, estramustine, vinblastine -trimethoprim -trimetrexate -vaccines Talk to your doctor or health care professional before taking any of these medicines: -acetaminophen -aspirin -ibuprofen -ketoprofen -naproxen This list may not describe all possible interactions. Give your health care provider a list of all the medicines, herbs, non-prescription drugs, or dietary supplements you use. Also tell them if you smoke, drink alcohol, or use illegal drugs. Some items may interact with your medicine. What should I watch for while using this medicine? Visit your doctor for checks on your progress. This drug may make you feel generally unwell. This is not uncommon, as chemotherapy can affect healthy cells as well as cancer cells. Report any side effects. Continue your course of treatment even though you feel ill unless your doctor tells you to stop. In some cases, you may be given additional  medicines to help with side effects. Follow all directions for their use. Call your doctor or health care professional for advice if you get a fever, chills or sore throat, or other symptoms of a cold or flu. Do not treat yourself. This drug decreases your body's ability to fight infections. Try to avoid being around people who are sick. This medicine may increase your risk to bruise or bleed. Call your doctor or health care professional if you notice any unusual bleeding. Be careful brushing and flossing your teeth or using a toothpick because you may get an infection or bleed more easily. If you have any dental work done, tell your dentist you are receiving this medicine. Avoid taking products that contain aspirin, acetaminophen, ibuprofen, naproxen, or ketoprofen unless instructed by your doctor. These medicines may hide a fever. Do not become pregnant while taking this medicine. Women should inform their doctor if they wish to become pregnant or think they might be pregnant. There is a potential for serious side effects to an unborn child. Talk to your health care professional or pharmacist for more information. Do not breast-feed an infant while taking this medicine. Men should inform their doctor if they wish to father a child. This medicine may lower sperm counts. Do not treat diarrhea with over the counter products. Contact your doctor if you have diarrhea that lasts more than 2 days or if  it is severe and watery. This medicine can make you more sensitive to the sun. Keep out of the sun. If you cannot avoid being in the sun, wear protective clothing and use sunscreen. Do not use sun lamps or tanning beds/booths. What side effects may I notice from receiving this medicine? Side effects that you should report to your doctor or health care professional as soon as possible: -allergic reactions like skin rash, itching or hives, swelling of the face, lips, or tongue -low blood counts - this medicine  may decrease the number of white blood cells, red blood cells and platelets. You may be at increased risk for infections and bleeding. -signs of infection - fever or chills, cough, sore throat, pain or difficulty passing urine -signs of decreased platelets or bleeding - bruising, pinpoint red spots on the skin, black, tarry stools, blood in the urine -signs of decreased red blood cells - unusually weak or tired, fainting spells, lightheadedness -breathing problems -changes in vision -chest pain -mouth sores -nausea and vomiting -pain, swelling, redness at site where injected -pain, tingling, numbness in the hands or feet -redness, swelling, or sores on hands or feet -stomach pain -unusual bleeding Side effects that usually do not require medical attention (report to your doctor or health care professional if they continue or are bothersome): -changes in finger or toe nails -diarrhea -dry or itchy skin -hair loss -headache -loss of appetite -sensitivity of eyes to the light -stomach upset -unusually teary eyes This list may not describe all possible side effects. Call your doctor for medical advice about side effects. You may report side effects to FDA at 1-800-FDA-1088. Where should I keep my medicine? This drug is given in a hospital or clinic and will not be stored at home. NOTE: This sheet is a summary. It may not cover all possible information. If you have questions about this medicine, talk to your doctor, pharmacist, or health care provider.  2018 Elsevier/Gold Standard (2007-10-10 13:53:16)

## 2017-08-31 ENCOUNTER — Inpatient Hospital Stay: Payer: 59

## 2017-08-31 ENCOUNTER — Encounter (HOSPITAL_COMMUNITY): Payer: Self-pay | Admitting: Oncology

## 2017-08-31 ENCOUNTER — Telehealth: Payer: Self-pay

## 2017-08-31 VITALS — BP 118/72 | HR 78 | Temp 98.4°F | Resp 17

## 2017-08-31 DIAGNOSIS — C20 Malignant neoplasm of rectum: Secondary | ICD-10-CM

## 2017-08-31 DIAGNOSIS — Z5111 Encounter for antineoplastic chemotherapy: Secondary | ICD-10-CM | POA: Diagnosis not present

## 2017-08-31 MED ORDER — LORAZEPAM 0.5 MG PO TABS: 0.5000 mg | ORAL_TABLET | Freq: Three times a day (TID) | ORAL | 0 refills | Status: DC

## 2017-08-31 MED ORDER — HEPARIN SOD (PORK) LOCK FLUSH 100 UNIT/ML IV SOLN
500.0000 [IU] | Freq: Once | INTRAVENOUS | Status: AC | PRN
  Administered 2017-08-31: 500 [IU]
  Filled 2017-08-31: qty 5

## 2017-08-31 MED ORDER — LORAZEPAM 0.5 MG PO TABS: 0.5000 mg | ORAL_TABLET | Freq: Three times a day (TID) | ORAL | 0 refills | Status: DC | PRN

## 2017-08-31 MED ORDER — SODIUM CHLORIDE 0.9% FLUSH
10.0000 mL | INTRAVENOUS | Status: DC | PRN
  Administered 2017-08-31: 10 mL
  Filled 2017-08-31: qty 10

## 2017-08-31 MED ORDER — DEXAMETHASONE 4 MG PO TABS: 4.0000 mg | ORAL_TABLET | Freq: Two times a day (BID) | ORAL | 0 refills | Status: AC

## 2017-08-31 NOTE — Telephone Encounter (Signed)
Returned call to patient regarding nausea. Pt reports "extreme nausea, I don't remember it feeling like this last time". Pt denies any episodes of emesis. Also reports that she is still able to drink and take pills, not eating much. Per Lattie Haw, NP ativan and dexamethasone prescribed. Pt voiced understanding.

## 2017-08-31 NOTE — Addendum Note (Signed)
Addended by: Sherril Croon on: 08/31/2017 02:06 PM   Modules accepted: Orders

## 2017-09-02 ENCOUNTER — Encounter: Payer: Self-pay | Admitting: Nurse Practitioner

## 2017-09-04 ENCOUNTER — Telehealth: Payer: Self-pay | Admitting: Oncology

## 2017-09-04 NOTE — Telephone Encounter (Signed)
10:38 am attempted to call patient and could not leave a vm to confirm FMLA paperwork has been successfully faxed to [employer] @ [phone] @ 10:27 am

## 2017-09-09 ENCOUNTER — Other Ambulatory Visit: Payer: Self-pay | Admitting: Oncology

## 2017-09-11 ENCOUNTER — Inpatient Hospital Stay: Payer: 59

## 2017-09-11 ENCOUNTER — Inpatient Hospital Stay (HOSPITAL_BASED_OUTPATIENT_CLINIC_OR_DEPARTMENT_OTHER): Payer: 59 | Admitting: Oncology

## 2017-09-11 ENCOUNTER — Telehealth: Payer: Self-pay | Admitting: Oncology

## 2017-09-11 VITALS — BP 102/65 | HR 80 | Temp 98.3°F | Resp 18 | Ht 64.0 in | Wt 155.5 lb

## 2017-09-11 DIAGNOSIS — C20 Malignant neoplasm of rectum: Secondary | ICD-10-CM

## 2017-09-11 DIAGNOSIS — C770 Secondary and unspecified malignant neoplasm of lymph nodes of head, face and neck: Secondary | ICD-10-CM | POA: Diagnosis not present

## 2017-09-11 DIAGNOSIS — Z5111 Encounter for antineoplastic chemotherapy: Secondary | ICD-10-CM | POA: Diagnosis not present

## 2017-09-11 DIAGNOSIS — R319 Hematuria, unspecified: Secondary | ICD-10-CM

## 2017-09-11 DIAGNOSIS — Z95828 Presence of other vascular implants and grafts: Secondary | ICD-10-CM | POA: Insufficient documentation

## 2017-09-11 DIAGNOSIS — N390 Urinary tract infection, site not specified: Secondary | ICD-10-CM

## 2017-09-11 LAB — CMP (CANCER CENTER ONLY)
ALT: 13 U/L (ref 0–55)
AST: 12 U/L (ref 5–34)
Albumin: 3.3 g/dL — ABNORMAL LOW (ref 3.5–5.0)
Alkaline Phosphatase: 61 U/L (ref 40–150)
Anion gap: 9 (ref 3–11)
BILIRUBIN TOTAL: 0.3 mg/dL (ref 0.2–1.2)
BUN: 18 mg/dL (ref 7–26)
CALCIUM: 9.2 mg/dL (ref 8.4–10.4)
CO2: 25 mmol/L (ref 22–29)
CREATININE: 0.96 mg/dL (ref 0.60–1.10)
Chloride: 108 mmol/L (ref 98–109)
Glucose, Bld: 81 mg/dL (ref 70–140)
Potassium: 3.7 mmol/L (ref 3.5–5.1)
SODIUM: 142 mmol/L (ref 136–145)
TOTAL PROTEIN: 6.7 g/dL (ref 6.4–8.3)

## 2017-09-11 LAB — CBC WITH DIFFERENTIAL (CANCER CENTER ONLY)
Basophils Absolute: 0 10*3/uL (ref 0.0–0.1)
Basophils Relative: 1 %
EOS ABS: 0.1 10*3/uL (ref 0.0–0.5)
Eosinophils Relative: 4 %
HCT: 35.4 % (ref 34.8–46.6)
HEMOGLOBIN: 12.2 g/dL (ref 11.6–15.9)
LYMPHS ABS: 0.4 10*3/uL — AB (ref 0.9–3.3)
LYMPHS PCT: 13 %
MCH: 30 pg (ref 25.1–34.0)
MCHC: 34.3 g/dL (ref 31.5–36.0)
MCV: 87.4 fL (ref 79.5–101.0)
MONOS PCT: 9 %
Monocytes Absolute: 0.3 10*3/uL (ref 0.1–0.9)
Neutro Abs: 2.3 10*3/uL (ref 1.5–6.5)
Neutrophils Relative %: 73 %
Platelet Count: 234 10*3/uL (ref 145–400)
RBC: 4.05 MIL/uL (ref 3.70–5.45)
RDW: 12.6 % (ref 11.2–14.5)
WBC Count: 3.2 10*3/uL — ABNORMAL LOW (ref 3.9–10.3)

## 2017-09-11 MED ORDER — SODIUM CHLORIDE 0.9 % IV SOLN
Freq: Once | INTRAVENOUS | Status: AC
  Administered 2017-09-11: 11:00:00 via INTRAVENOUS

## 2017-09-11 MED ORDER — PALONOSETRON HCL INJECTION 0.25 MG/5ML
0.2500 mg | Freq: Once | INTRAVENOUS | Status: AC
  Administered 2017-09-11: 0.25 mg via INTRAVENOUS

## 2017-09-11 MED ORDER — FLUOROURACIL CHEMO INJECTION 2.5 GM/50ML
400.0000 mg/m2 | Freq: Once | INTRAVENOUS | Status: AC
  Administered 2017-09-11: 700 mg via INTRAVENOUS
  Filled 2017-09-11: qty 14

## 2017-09-11 MED ORDER — SODIUM CHLORIDE 0.9 % IV SOLN
Freq: Once | INTRAVENOUS | Status: AC
  Administered 2017-09-11: 12:00:00 via INTRAVENOUS
  Filled 2017-09-11: qty 5

## 2017-09-11 MED ORDER — ATROPINE SULFATE 1 MG/ML IJ SOLN
INTRAMUSCULAR | Status: AC
  Filled 2017-09-11: qty 1

## 2017-09-11 MED ORDER — DEXAMETHASONE SODIUM PHOSPHATE 10 MG/ML IJ SOLN
INTRAMUSCULAR | Status: AC
  Filled 2017-09-11: qty 1

## 2017-09-11 MED ORDER — SODIUM CHLORIDE 0.9% FLUSH
10.0000 mL | INTRAVENOUS | Status: DC | PRN
  Administered 2017-09-11: 10 mL
  Filled 2017-09-11: qty 10

## 2017-09-11 MED ORDER — IRINOTECAN HCL CHEMO INJECTION 100 MG/5ML
180.0000 mg/m2 | Freq: Once | INTRAVENOUS | Status: AC
  Administered 2017-09-11: 320 mg via INTRAVENOUS
  Filled 2017-09-11: qty 16

## 2017-09-11 MED ORDER — SODIUM CHLORIDE 0.9 % IV SOLN
2400.0000 mg/m2 | INTRAVENOUS | Status: DC
  Administered 2017-09-11: 4250 mg via INTRAVENOUS
  Filled 2017-09-11: qty 85

## 2017-09-11 MED ORDER — PALONOSETRON HCL INJECTION 0.25 MG/5ML
INTRAVENOUS | Status: AC
  Filled 2017-09-11: qty 5

## 2017-09-11 MED ORDER — DEXAMETHASONE 4 MG PO TABS: 4.0000 mg | ORAL_TABLET | Freq: Every day | ORAL | 3 refills | Status: DC

## 2017-09-11 MED ORDER — ATROPINE SULFATE 1 MG/ML IJ SOLN
0.5000 mg | Freq: Once | INTRAMUSCULAR | Status: AC | PRN
  Administered 2017-09-11: 0.5 mg via INTRAVENOUS

## 2017-09-11 MED ORDER — LEUCOVORIN CALCIUM INJECTION 350 MG
400.0000 mg/m2 | Freq: Once | INTRAVENOUS | Status: AC
  Administered 2017-09-11: 712 mg via INTRAVENOUS
  Filled 2017-09-11: qty 35.6

## 2017-09-11 NOTE — Addendum Note (Signed)
Addended by: Sherril Croon on: 09/11/2017 01:55 PM   Modules accepted: Orders

## 2017-09-11 NOTE — Telephone Encounter (Signed)
Scheduled appt per 3/25 los - patient to get an updated schedule in the treatment area,

## 2017-09-11 NOTE — Patient Instructions (Signed)
[place] Discharge Instructions for Patients Receiving Chemotherapy  Today you received the following chemotherapy agents: Irinotecan (Camptosar), Leucovorin, Fluorouracil (Adrucil, 5-FU).  To help prevent nausea and vomiting after your treatment, we encourage you to take your nausea medication as prescribed. Received Aloxi during treatment-->take Compazine (not Zofran) for the next 3 days as needed.  If you develop nausea and vomiting that is not controlled by your nausea medication, call the clinic.   BELOW ARE SYMPTOMS THAT SHOULD BE REPORTED IMMEDIATELY:  *FEVER GREATER THAN 100.5 F  *CHILLS WITH OR WITHOUT FEVER  NAUSEA AND VOMITING THAT IS NOT CONTROLLED WITH YOUR NAUSEA MEDICATION  *UNUSUAL SHORTNESS OF BREATH  *UNUSUAL BRUISING OR BLEEDING  TENDERNESS IN MOUTH AND THROAT WITH OR WITHOUT PRESENCE OF ULCERS  *URINARY PROBLEMS  *BOWEL PROBLEMS  UNUSUAL RASH Items with * indicate a potential emergency and should be followed up as soon as possible.  Feel free to call the clinic should you have any questions or concerns. The clinic phone number is [phone].  Please show the [place] at check-in to the Emergency Department and triage nurse.

## 2017-09-11 NOTE — Progress Notes (Signed)
[name] OFFICE PROGRESS NOTE   Diagnosis: Rectal cancer  INTERVAL HISTORY:   [name] returns as scheduled.  She completed cycle 1 FOLFIRI 08/29/2017.  She reports nausea beginning on day 2.  She had an episode of emesis on day 4.  No mouth sores or diarrhea.  She noted "burning "of the face.  She continues to have intermittent pain in the left lower abdomen.  Objective:  Vital signs in last 24 hours:  Blood pressure 102/65, pulse 80, temperature 98.3 F (36.8 C), temperature source Oral, resp. rate 18, height 5' 4"  (1.626 m), weight 155 lb 8 oz (70.5 kg), SpO2 100 %.    HEENT: Firm mass at the medial left supraclavicular area Resp: Lungs clear bilaterally Cardio: Regular rate and rhythm GI: No hepatomegaly, nontender, no mass Vascular: No leg edema  Skin: Palms without erythema  Portacath/PICC-without erythema  Lab Results:  Lab Results  Component Value Date   WBC 3.2 (L) 09/11/2017   HGB 12.3 08/25/2017   HCT 35.4 09/11/2017   MCV 87.4 09/11/2017   PLT 234 09/11/2017   NEUTROABS 2.3 09/11/2017    CMP     Component Value Date/Time   NA 138 08/29/2017 1129   NA 138 11/08/2016 1205   K 4.0 08/29/2017 1129   K 4.2 11/08/2016 1205   CL 106 08/29/2017 1129   CO2 23 08/29/2017 1129   CO2 26 11/08/2016 1205   GLUCOSE 85 08/29/2017 1129   GLUCOSE 90 11/08/2016 1205   BUN 17 08/29/2017 1129   BUN 23.0 11/08/2016 1205   CREATININE 1.00 08/29/2017 1129   CREATININE 1.0 11/08/2016 1205   CALCIUM 9.5 08/29/2017 1129   CALCIUM 9.6 11/08/2016 1205   PROT 7.5 08/29/2017 1129   PROT 6.5 06/02/2014 1004   ALBUMIN 3.6 08/29/2017 1129   ALBUMIN 3.4 (L) 06/02/2014 1004   AST 14 08/29/2017 1129   AST 16 06/02/2014 1004   ALT 12 08/29/2017 1129   ALT 18 06/02/2014 1004   ALKPHOS 74 08/29/2017 1129   ALKPHOS 69 06/02/2014 1004   BILITOT 0.3 08/29/2017 1129   BILITOT 0.36 06/02/2014 1004   GFRNONAA >60 08/29/2017 1129   GFRAA >60 08/29/2017 1129      Lab Results  Component Value Date   CEA1 5.14 (H) 08/29/2017     Medications: I have reviewed the patient's current medications.   Assessment/Plan: 1. Rectal cancer, clinical stage III (T3 N1) status post biopsy of a rectal mass on 05/24/2013 confirming adenocarcinoma. Endoscopic ultrasound 05/31/2013 measured the mass at 11 cm from the anal verge, uT3uN1.  Initiation of radiation and concurrent Xeloda 06/18/2013; completion 07/26/2013.   Status post low anterior resection 09/02/2013. Invasive adenocarcinoma, 3.7 cm, negative margins; metastatic carcinoma in 7 of 11 lymph nodes; extensive lymph vascular involvement by tumor.MSI-stable, no loss of mismatch repair protein expression  Cycle 1 adjuvant FOLFOX 10/15/2013.   Cycle 2 adjuvant FOLFOX 10/29/2013.   Cycle 3 adjuvant FOLFOX 11/14/2013.   Cycle 4 adjuvant FOLFOX 11/27/2013.   Cycle 5 adjuvant FOLFOX 12/10/2013   Cycle 6 adjuvant FOLFOX 12/24/2013.   Cycle 7 adjuvant FOLFOX 01/14/2014.   Cycle 8 adjuvant FOLFOX 01/28/2014   Cycle 9 adjuvant FOLFOX 02/11/2014 (oxaliplatin dose reduced secondary to thrombocytopenia)   Surveillance CT scans 06/02/2014 with no evidence of recurrent rectal cancer  surveillance CT scans 06/16/2015 with no evidence of recurrent rectal cancer, 10 mm right external iliac node , increased amount of loculated air associated with the presacral soft tissue  thickening , new left hydronephrosis  Surveillance CT scans 05/06/2016-negative for recurrent rectal cancer, decreased right external iliac node, persistent left hydronephrosis  CT abdomen/pelvis 07/05/2017-new left para-aortic, aortocaval, right common iliac and right external iliac lymphadenopathy.  CT neck/chest 07/13/2017-enlarged left supraclavicular nodes, new paratracheal nodes  Ultrasound-guided biopsy of a left supraclavicular lymph node 07/25/2017-metastatic adenocarcinoma consistent with colorectal cancer, MSI-stable,  tumor mutation burden-10, K-ras S65 mutation  Cycle 1 FOLFIRI 08/28/2017  Cycle 2 FOLFIRI 09/11/2017 2. Post colonoscopy bowel perforation confirmed on abdomen CT 05/28/2013. 3. Single indeterminate liver lesion on the abdomen CT 05/28/2013. MRI on 06/18/2013 confirmed 2 tiny liver lesions, indeterminate-favored to be benign cysts. 4. History of cervical and lumbar disc disease.  5. History of HELLP syndrome with first pregnancy.  6. Delayed nausea following cycle 1 FOLFOX. She receives Aloxi and Emend. She continues to experience delayed nausea. Prophylactic Decadron added with cycle 4. 7. History of pain with bowel movements. 8. Anastomotic leak on CT 09/13/2013. Status post exploratory laparotomy with drainage of a pelvic abscess, diverting loop ileostomy. Drainage catheter removed 10/23/2013.  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 Perineal numbness, urinary retention. The urinary retention has improved. 10. Perineal pain-improved. 11. Neutropenia secondary chemotherapy-she received Neulasta. 12. Urinary tract infection, Escherichia coli-11/03/2013. 13. Venous engorgement at the left anterior chest and left arm with swelling of the left arm and hand. Venous Doppler 11/14/2013 positive for DVT. She began Lovenox 11/14/2013 and was switched to xarelto 12/10/2013. Xarelto discontinued early January 2016. 14. Fever 11/20/2013. Blood and urine cultures negative. The fever resolved within 24 hours. She continued to have a single episode of fever following chemotherapy 15. History of a herpetic lip lesion 16. 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 17. Escherichia coli bacteremia, urinary source, 01/14/2014. She completed a 10 day course of ceftriaxone. 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 Anxiety/depression. 20. Port-A-Cath  removal 06/09/2014. 21. Escherichia coli urinary tract infection 06/24/2014. She completed a course of ciprofloxacin. 22. frequent bowel movements following surgery/radiation -status post placement of a sacral stimulator 11/21/2014 23. CT 06/16/2015 with new left hydronephrosis status post cystoscopy with placement of a double-J stent 07/17/2015. No tumors or stones were seen in the bladder. Both ureteral orifices were unremarkable. On the left side retrograde study was performed. The distal ureter was narrowed and then a little bit more dilated proximally with hydronephrosis noted. There was no evidence of intramural tumor but the area did seem a bit strictured.  Stent removed, persistent hydronephrosis, followed by Dr. Amalia Hailey  CT 05/06/2016-increased left hydronephrosis  Ultrasound 09/09/2016-stable left hydroureteronephrosis  Left nephrectomy 04/28/2017  24.CT abdomen/pelvis 07/05/2017-new left para-aortic, aortocaval, right common iliac and right external iliac lymphadenopathy.    Disposition: Ms. Delpriore appears unchanged.  She completed 1 cycle of FOLFIRI.  The chemotherapy was complicated by delayed nausea.  We will adjust the antiemetic regimen with cycle 2.  Emend will be added and she will take prophylactic Decadron for 3 days.  She will contact us for nausea following the cycle of chemotherapy.  She did not qualify for the [facility] study secondary to the history of a rectal fistula.  I will keep Avastin on hold.  She is a candidate for panitumumab in the future as it is unclear whether the K-ras mutation in her tumor confers resistance to EGFR inhibitors.  25 minutes were spent with the patient today.  The majority of the time was  used for counseling and coordination of care.  Renee Coder, MD  09/11/2017  10:30 AM

## 2017-09-13 ENCOUNTER — Inpatient Hospital Stay: Payer: 59

## 2017-09-13 VITALS — BP 107/62 | HR 76 | Temp 97.7°F | Resp 18

## 2017-09-13 DIAGNOSIS — Z5111 Encounter for antineoplastic chemotherapy: Secondary | ICD-10-CM | POA: Diagnosis not present

## 2017-09-13 DIAGNOSIS — C20 Malignant neoplasm of rectum: Secondary | ICD-10-CM

## 2017-09-13 MED ORDER — HEPARIN SOD (PORK) LOCK FLUSH 100 UNIT/ML IV SOLN
500.0000 [IU] | Freq: Once | INTRAVENOUS | Status: AC | PRN
  Administered 2017-09-13: 500 [IU]
  Filled 2017-09-13: qty 5

## 2017-09-13 MED ORDER — SODIUM CHLORIDE 0.9% FLUSH
10.0000 mL | INTRAVENOUS | Status: DC | PRN
  Administered 2017-09-13: 10 mL
  Filled 2017-09-13: qty 10

## 2017-09-23 ENCOUNTER — Other Ambulatory Visit: Payer: Self-pay | Admitting: Oncology

## 2017-09-25 ENCOUNTER — Inpatient Hospital Stay: Payer: 59

## 2017-09-25 ENCOUNTER — Telehealth: Payer: Self-pay | Admitting: Oncology

## 2017-09-25 ENCOUNTER — Inpatient Hospital Stay: Payer: 59 | Attending: Nurse Practitioner | Admitting: Oncology

## 2017-09-25 VITALS — BP 122/73 | HR 73 | Temp 99.0°F | Resp 20 | Ht 64.0 in | Wt 151.5 lb

## 2017-09-25 DIAGNOSIS — Z452 Encounter for adjustment and management of vascular access device: Secondary | ICD-10-CM | POA: Diagnosis not present

## 2017-09-25 DIAGNOSIS — C770 Secondary and unspecified malignant neoplasm of lymph nodes of head, face and neck: Secondary | ICD-10-CM | POA: Diagnosis not present

## 2017-09-25 DIAGNOSIS — N390 Urinary tract infection, site not specified: Secondary | ICD-10-CM

## 2017-09-25 DIAGNOSIS — C20 Malignant neoplasm of rectum: Secondary | ICD-10-CM | POA: Insufficient documentation

## 2017-09-25 DIAGNOSIS — Z5111 Encounter for antineoplastic chemotherapy: Secondary | ICD-10-CM | POA: Insufficient documentation

## 2017-09-25 DIAGNOSIS — Z95828 Presence of other vascular implants and grafts: Secondary | ICD-10-CM

## 2017-09-25 DIAGNOSIS — R319 Hematuria, unspecified: Secondary | ICD-10-CM

## 2017-09-25 LAB — CMP (CANCER CENTER ONLY)
ALT: 11 U/L (ref 0–55)
AST: 12 U/L (ref 5–34)
Albumin: 3.5 g/dL (ref 3.5–5.0)
Alkaline Phosphatase: 66 U/L (ref 40–150)
Anion gap: 6 (ref 3–11)
BILIRUBIN TOTAL: 0.3 mg/dL (ref 0.2–1.2)
BUN: 19 mg/dL (ref 7–26)
CO2: 24 mmol/L (ref 22–29)
CREATININE: 1.05 mg/dL (ref 0.60–1.10)
Calcium: 9.4 mg/dL (ref 8.4–10.4)
Chloride: 110 mmol/L — ABNORMAL HIGH (ref 98–109)
GFR, EST NON AFRICAN AMERICAN: 59 mL/min — AB (ref >60)
Glucose, Bld: 91 mg/dL (ref 70–140)
POTASSIUM: 4.1 mmol/L (ref 3.5–5.1)
Sodium: 140 mmol/L (ref 136–145)
TOTAL PROTEIN: 6.8 g/dL (ref 6.4–8.3)

## 2017-09-25 LAB — CBC WITH DIFFERENTIAL (CANCER CENTER ONLY)
BASOS ABS: 0 10*3/uL (ref 0.0–0.1)
Basophils Relative: 1 %
EOS ABS: 0.2 10*3/uL (ref 0.0–0.5)
EOS PCT: 7 %
HCT: 36.9 % (ref 34.8–46.6)
Hemoglobin: 12.3 g/dL (ref 11.6–15.9)
Lymphocytes Relative: 17 %
Lymphs Abs: 0.4 10*3/uL — ABNORMAL LOW (ref 0.9–3.3)
MCH: 29.5 pg (ref 25.1–34.0)
MCHC: 33.3 g/dL (ref 31.5–36.0)
MCV: 88.7 fL (ref 79.5–101.0)
Monocytes Absolute: 0.3 10*3/uL (ref 0.1–0.9)
Monocytes Relative: 10 %
Neutro Abs: 1.8 10*3/uL (ref 1.5–6.5)
Neutrophils Relative %: 65 %
PLATELETS: 192 10*3/uL (ref 145–400)
RBC: 4.16 MIL/uL (ref 3.70–5.45)
RDW: 13.2 % (ref 11.2–14.5)
WBC: 2.7 10*3/uL — AB (ref 3.9–10.3)

## 2017-09-25 MED ORDER — ATROPINE SULFATE 1 MG/ML IJ SOLN
INTRAMUSCULAR | Status: AC
  Filled 2017-09-25: qty 1

## 2017-09-25 MED ORDER — IRINOTECAN HCL CHEMO INJECTION 100 MG/5ML
180.0000 mg/m2 | Freq: Once | INTRAVENOUS | Status: AC
  Administered 2017-09-25: 320 mg via INTRAVENOUS
  Filled 2017-09-25: qty 16

## 2017-09-25 MED ORDER — ACYCLOVIR 400 MG PO TABS: 400.0000 mg | ORAL_TABLET | Freq: Two times a day (BID) | ORAL | 1 refills | Status: DC | PRN

## 2017-09-25 MED ORDER — FOSAPREPITANT DIMEGLUMINE INJECTION 150 MG
Freq: Once | INTRAVENOUS | Status: AC
  Administered 2017-09-25: 12:00:00 via INTRAVENOUS
  Filled 2017-09-25: qty 5

## 2017-09-25 MED ORDER — LORAZEPAM 0.5 MG PO TABS: ORAL_TABLET | ORAL | 0 refills | Status: DC

## 2017-09-25 MED ORDER — LEUCOVORIN CALCIUM INJECTION 350 MG
400.0000 mg/m2 | Freq: Once | INTRAVENOUS | Status: AC
  Administered 2017-09-25: 712 mg via INTRAVENOUS
  Filled 2017-09-25: qty 35.6

## 2017-09-25 MED ORDER — SODIUM CHLORIDE 0.9 % IV SOLN
Freq: Once | INTRAVENOUS | Status: AC
  Administered 2017-09-25: 11:00:00 via INTRAVENOUS

## 2017-09-25 MED ORDER — PALONOSETRON HCL INJECTION 0.25 MG/5ML
INTRAVENOUS | Status: AC
  Filled 2017-09-25: qty 5

## 2017-09-25 MED ORDER — FLUOROURACIL CHEMO INJECTION 2.5 GM/50ML
400.0000 mg/m2 | Freq: Once | INTRAVENOUS | Status: AC
  Administered 2017-09-25: 700 mg via INTRAVENOUS
  Filled 2017-09-25: qty 14

## 2017-09-25 MED ORDER — SODIUM CHLORIDE 0.9 % IV SOLN
2400.0000 mg/m2 | INTRAVENOUS | Status: DC
  Administered 2017-09-25: 4250 mg via INTRAVENOUS
  Filled 2017-09-25: qty 85

## 2017-09-25 MED ORDER — PALONOSETRON HCL INJECTION 0.25 MG/5ML
0.2500 mg | Freq: Once | INTRAVENOUS | Status: AC
  Administered 2017-09-25: 0.25 mg via INTRAVENOUS

## 2017-09-25 MED ORDER — ATROPINE SULFATE 1 MG/ML IJ SOLN
0.5000 mg | Freq: Once | INTRAMUSCULAR | Status: AC | PRN
  Administered 2017-09-25: 0.5 mg via INTRAVENOUS

## 2017-09-25 MED ORDER — SODIUM CHLORIDE 0.9% FLUSH
10.0000 mL | INTRAVENOUS | Status: DC | PRN
  Administered 2017-09-25: 10 mL
  Filled 2017-09-25: qty 10

## 2017-09-25 NOTE — Patient Instructions (Signed)
[facility] Discharge Instructions for Patients Receiving Chemotherapy  Today you received the following chemotherapy agents:  Irinotecan, Leucovorin, and 5FU.   To help prevent nausea and vomiting after your treatment, we encourage you to take your nausea medication as directed.   If you develop nausea and vomiting that is not controlled by your nausea medication, call the clinic.   BELOW ARE SYMPTOMS THAT SHOULD BE REPORTED IMMEDIATELY:  *FEVER GREATER THAN 100.5 F  *CHILLS WITH OR WITHOUT FEVER  NAUSEA AND VOMITING THAT IS NOT CONTROLLED WITH YOUR NAUSEA MEDICATION  *UNUSUAL SHORTNESS OF BREATH  *UNUSUAL BRUISING OR BLEEDING  TENDERNESS IN MOUTH AND THROAT WITH OR WITHOUT PRESENCE OF ULCERS  *URINARY PROBLEMS  *BOWEL PROBLEMS  UNUSUAL RASH Items with * indicate a potential emergency and should be followed up as soon as possible.  Feel free to call the clinic should you have any questions or concerns. The clinic phone number is [phone].  Please show the CHEMO ALERT CARD at check-in to the Emergency Department and triage nurse.   

## 2017-09-25 NOTE — Telephone Encounter (Signed)
Scheduled appt per 4/8 los - patient to get an updated schedule in the treatment area 

## 2017-09-25 NOTE — Progress Notes (Signed)
[place] OFFICE PROGRESS NOTE   Diagnosis: Rectal cancer  INTERVAL HISTORY:   [name] completed another cycle of FOLFIRI on 09/11/2017.  No diarrhea.  She had a "burning" feeling in the mouth, but no discrete ulcers.  Her stools became firm after completing chemotherapy.  She started a stool softener.  The left neck mass is harder to feel. She complains of insomnia.  She took Decadron prophylaxis and had less nausea following this cycle.  Objective:  Vital signs in last 24 hours:  Blood pressure 122/73, pulse 73, temperature 99 F (37.2 C), temperature source Oral, resp. rate 20, height 5' 4"  (1.626 m), weight 151 lb 8 oz (68.7 kg), SpO2 100 %.    HEENT: No thrush or ulcers Lymphatics: Tip of a firm nodal mass at the medial left lower neck Resp: Lungs clear bilaterally Cardio: Regular rate and rhythm GI: No hepatomegaly, nontender Vascular: No leg edema  Skin: Palms without erythema  Portacath/PICC-without erythema  Lab Results:  Lab Results  Component Value Date   WBC 2.7 (L) 09/25/2017   HGB 12.3 08/25/2017   HCT 36.9 09/25/2017   MCV 88.7 09/25/2017   PLT 192 09/25/2017   NEUTROABS 1.8 09/25/2017    CMP     Component Value Date/Time   NA 140 09/25/2017 0951   NA 138 11/08/2016 1205   K 4.1 09/25/2017 0951   K 4.2 11/08/2016 1205   CL 110 (H) 09/25/2017 0951   CO2 24 09/25/2017 0951   CO2 26 11/08/2016 1205   GLUCOSE 91 09/25/2017 0951   GLUCOSE 90 11/08/2016 1205   BUN 19 09/25/2017 0951   BUN 23.0 11/08/2016 1205   CREATININE 1.05 09/25/2017 0951   CREATININE 1.0 11/08/2016 1205   CALCIUM 9.4 09/25/2017 0951   CALCIUM 9.6 11/08/2016 1205   PROT 6.8 09/25/2017 0951   PROT 6.5 06/02/2014 1004   ALBUMIN 3.5 09/25/2017 0951   ALBUMIN 3.4 (L) 06/02/2014 1004   AST 12 09/25/2017 0951   AST 16 06/02/2014 1004   ALT 11 09/25/2017 0951   ALT 18 06/02/2014 1004   ALKPHOS 66 09/25/2017 0951   ALKPHOS 69 06/02/2014 1004   BILITOT  0.3 09/25/2017 0951   BILITOT 0.36 06/02/2014 1004   GFRNONAA 59 (L) 09/25/2017 0951   GFRAA >60 09/25/2017 0951    Lab Results  Component Value Date   CEA1 5.14 (H) 08/29/2017     Medications: I have reviewed the patient's current medications.   Assessment/Plan: 1. Rectal cancer, clinical stage III (T3 N1) status post biopsy of a rectal mass on 05/24/2013 confirming adenocarcinoma. Endoscopic ultrasound 05/31/2013 measured the mass at 11 cm from the anal verge, uT3uN1.  Initiation of radiation and concurrent Xeloda 06/18/2013; completion 07/26/2013.   Status post low anterior resection 09/02/2013. Invasive adenocarcinoma, 3.7 cm, negative margins; metastatic carcinoma in 7 of 11 lymph nodes; extensive lymph vascular involvement by tumor.MSI-stable, no loss of mismatch repair protein expression  Cycle 1 adjuvant FOLFOX 10/15/2013.   Cycle 2 adjuvant FOLFOX 10/29/2013.   Cycle 3 adjuvant FOLFOX 11/14/2013.   Cycle 4 adjuvant FOLFOX 11/27/2013.   Cycle 5 adjuvant FOLFOX 12/10/2013   Cycle 6 adjuvant FOLFOX 12/24/2013.   Cycle 7 adjuvant FOLFOX 01/14/2014.   Cycle 8 adjuvant FOLFOX 01/28/2014   Cycle 9 adjuvant FOLFOX 02/11/2014 (oxaliplatin dose reduced secondary to thrombocytopenia)   Surveillance CT scans 06/02/2014 with no evidence of recurrent rectal cancer  surveillance CT scans 06/16/2015 with no evidence of recurrent rectal cancer, 10  mm right external iliac node , increased amount of loculated air associated with the presacral soft tissue thickening , new left hydronephrosis  Surveillance CT scans 05/06/2016-negative for recurrent rectal cancer, decreased right external iliac node, persistent left hydronephrosis  CT abdomen/pelvis 07/05/2017-new left para-aortic, aortocaval, right common iliac and right external iliac lymphadenopathy.  CT neck/chest 07/13/2017-enlarged left supraclavicular nodes, new paratracheal nodes  Ultrasound-guided biopsy of a  left supraclavicular lymph node 07/25/2017-metastatic adenocarcinoma consistent with colorectal cancer, MSI-stable, tumor mutation burden-10, K-ras S65 mutation  Cycle 1 FOLFIRI 08/28/2017  Cycle 2 FOLFIRI 09/11/2017  Cycle 3 FOLFIRI 09/25/2017 2. Post colonoscopy bowel perforation confirmed on abdomen CT 05/28/2013. 3. Single indeterminate liver lesion on the abdomen CT 05/28/2013. MRI on 06/18/2013 confirmed 2 tiny liver lesions, indeterminate-favored to be benign cysts. 4. History of cervical and lumbar disc disease.  5. History of HELLP syndrome with first pregnancy.  6. Delayed nausea following cycle 1 FOLFOX. She receives Aloxi and Emend. She continues to experience delayed nausea. Prophylactic Decadron added with cycle 4. 7. History of pain with bowel movements. 8. Anastomotic leak on CT 09/13/2013. Status post exploratory laparotomy with drainage of a pelvic abscess, diverting loop ileostomy. Drainage catheter removed 10/23/2013.  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 Perineal numbness, urinary retention. The urinary retention has improved. 10. Perineal pain-improved. 11. Neutropenia secondary chemotherapy-she received Neulasta. 12. Urinary tract infection, Escherichia coli-11/03/2013. 13. Venous engorgement at the left anterior chest and left arm with swelling of the left arm and hand. Venous Doppler 11/14/2013 positive for DVT. She began Lovenox 11/14/2013 and was switched to xarelto 12/10/2013. Xarelto discontinued early January 2016. 14. Fever 11/20/2013. Blood and urine cultures negative. The fever resolved within 24 hours. She continued to have a single episode of fever following chemotherapy 15. History of a herpetic lip lesion 16. 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 17. Escherichia coli bacteremia, urinary source, 01/14/2014. She completed a 10 day  course of ceftriaxone. 2. 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 Anxiety/depression. 20. Port-A-Cath removal 06/09/2014. 21. Escherichia coli urinary tract infection 06/24/2014. She completed a course of ciprofloxacin. 22. frequent bowel movements following surgery/radiation -status post placement of a sacral stimulator 11/21/2014 23. CT 06/16/2015 with new left hydronephrosis status post cystoscopy with placement of a double-J stent 07/17/2015. No tumors or stones were seen in the bladder. Both ureteral orifices were unremarkable. On the left side retrograde study was performed. The distal ureter was narrowed and then a little bit more dilated proximally with hydronephrosis noted. There was no evidence of intramural tumor but the area did seem a bit strictured.  Stent removed, persistent hydronephrosis, followed by Dr. Amalia Hailey  CT 05/06/2016-increased left hydronephrosis  Ultrasound 09/09/2016-stable left hydroureteronephrosis  Left nephrectomy 04/28/2017  24.CT abdomen/pelvis 07/05/2017-new left para-aortic, aortocaval, right common iliac and right external iliac lymphadenopathy.   Disposition: [name] appears stable.  She has completed 2 cycles of FOLFIRI chemotherapy.  She had less nausea with Emend and prophylactic Decadron following cycle 2.  The insomnia is likely in part related to Decadron.  She will try Ativan for the insomnia.  She understands to not take Ativan and Xanax together. We will check the CEA when she returns in 2 weeks.  The white count is borderline low today.  She will contact us for a fever.    Betsy Coder, MD  09/25/2017  12:09 PM

## 2017-09-27 ENCOUNTER — Inpatient Hospital Stay: Payer: 59

## 2017-09-27 VITALS — BP 110/68 | HR 95 | Temp 97.7°F | Resp 20

## 2017-09-27 DIAGNOSIS — Z5111 Encounter for antineoplastic chemotherapy: Secondary | ICD-10-CM | POA: Diagnosis not present

## 2017-09-27 DIAGNOSIS — C20 Malignant neoplasm of rectum: Secondary | ICD-10-CM

## 2017-09-27 MED ORDER — SODIUM CHLORIDE 0.9% FLUSH
10.0000 mL | INTRAVENOUS | Status: DC | PRN
  Administered 2017-09-27: 10 mL
  Filled 2017-09-27: qty 10

## 2017-09-27 MED ORDER — HEPARIN SOD (PORK) LOCK FLUSH 100 UNIT/ML IV SOLN
500.0000 [IU] | Freq: Once | INTRAVENOUS | Status: AC | PRN
  Administered 2017-09-27: 500 [IU]
  Filled 2017-09-27: qty 5

## 2017-09-27 NOTE — Patient Instructions (Signed)
Implanted Port Home Guide An implanted port is a type of central line that is placed under the skin. Central lines are used to provide IV access when treatment or nutrition needs to be given through a person's veins. Implanted ports are used for long-term IV access. An implanted port may be placed because:  You need IV medicine that would be irritating to the small veins in your hands or arms.  You need long-term IV medicines, such as antibiotics.  You need IV nutrition for a long period.  You need frequent blood draws for lab tests.  You need dialysis.  Implanted ports are usually placed in the chest area, but they can also be placed in the upper arm, the abdomen, or the leg. An implanted port has two main parts:  Reservoir. The reservoir is round and will appear as a small, raised area under your skin. The reservoir is the part where a needle is inserted to give medicines or draw blood.  Catheter. The catheter is a thin, flexible tube that extends from the reservoir. The catheter is placed into a large vein. Medicine that is inserted into the reservoir goes into the catheter and then into the vein.  How will I care for my incision site? Do not get the incision site wet. Bathe or shower as directed by your health care provider. How is my port accessed? Special steps must be taken to access the port:  Before the port is accessed, a numbing cream can be placed on the skin. This helps numb the skin over the port site.  Your health care provider uses a sterile technique to access the port. ? Your health care provider must put on a mask and sterile gloves. ? The skin over your port is cleaned carefully with an antiseptic and allowed to dry. ? The port is gently pinched between sterile gloves, and a needle is inserted into the port.  Only "non-coring" port needles should be used to access the port. Once the port is accessed, a blood return should be checked. This helps ensure that the port  is in the vein and is not clogged.  If your port needs to remain accessed for a constant infusion, a clear (transparent) bandage will be placed over the needle site. The bandage and needle will need to be changed every week, or as directed by your health care provider.  Keep the bandage covering the needle clean and dry. Do not get it wet. Follow your health care provider's instructions on how to take a shower or bath while the port is accessed.  If your port does not need to stay accessed, no bandage is needed over the port.  What is flushing? Flushing helps keep the port from getting clogged. Follow your health care provider's instructions on how and when to flush the port. Ports are usually flushed with saline solution or a medicine called heparin. The need for flushing will depend on how the port is used.  If the port is used for intermittent medicines or blood draws, the port will need to be flushed: ? After medicines have been given. ? After blood has been drawn. ? As part of routine maintenance.  If a constant infusion is running, the port may not need to be flushed.  How long will my port stay implanted? The port can stay in for as long as your health care provider thinks it is needed. When it is time for the port to come out, surgery will be   done to remove it. The procedure is similar to the one performed when the port was put in. When should I seek immediate medical care? When you have an implanted port, you should seek immediate medical care if:  You notice a bad smell coming from the incision site.  You have swelling, redness, or drainage at the incision site.  You have more swelling or pain at the port site or the surrounding area.  You have a fever that is not controlled with medicine.  This information is not intended to replace advice given to you by your health care provider. Make sure you discuss any questions you have with your health care provider. Document  Released: 06/06/2005 Document Revised: 11/12/2015 Document Reviewed: 02/11/2013 Elsevier Interactive Patient Education  2017 [employer]  

## 2017-10-08 ENCOUNTER — Other Ambulatory Visit: Payer: Self-pay | Admitting: Oncology

## 2017-10-09 ENCOUNTER — Telehealth: Payer: Self-pay | Admitting: Oncology

## 2017-10-09 ENCOUNTER — Inpatient Hospital Stay: Payer: 59

## 2017-10-09 ENCOUNTER — Inpatient Hospital Stay (HOSPITAL_BASED_OUTPATIENT_CLINIC_OR_DEPARTMENT_OTHER): Payer: 59 | Admitting: Oncology

## 2017-10-09 VITALS — BP 121/69 | HR 75 | Temp 98.7°F | Resp 20 | Ht 64.0 in | Wt 153.7 lb

## 2017-10-09 DIAGNOSIS — C20 Malignant neoplasm of rectum: Secondary | ICD-10-CM | POA: Diagnosis not present

## 2017-10-09 DIAGNOSIS — C770 Secondary and unspecified malignant neoplasm of lymph nodes of head, face and neck: Secondary | ICD-10-CM | POA: Diagnosis not present

## 2017-10-09 DIAGNOSIS — Z95828 Presence of other vascular implants and grafts: Secondary | ICD-10-CM

## 2017-10-09 DIAGNOSIS — R319 Hematuria, unspecified: Principal | ICD-10-CM

## 2017-10-09 DIAGNOSIS — Z5111 Encounter for antineoplastic chemotherapy: Secondary | ICD-10-CM | POA: Diagnosis not present

## 2017-10-09 DIAGNOSIS — N390 Urinary tract infection, site not specified: Secondary | ICD-10-CM

## 2017-10-09 LAB — CMP (CANCER CENTER ONLY)
ALBUMIN: 3.7 g/dL (ref 3.5–5.0)
ALK PHOS: 64 U/L (ref 40–150)
ALT: 11 U/L (ref 0–55)
AST: 12 U/L (ref 5–34)
Anion gap: 8 (ref 3–11)
BILIRUBIN TOTAL: 0.4 mg/dL (ref 0.2–1.2)
BUN: 15 mg/dL (ref 7–26)
CALCIUM: 9.4 mg/dL (ref 8.4–10.4)
CO2: 24 mmol/L (ref 22–29)
CREATININE: 1.05 mg/dL (ref 0.60–1.10)
Chloride: 109 mmol/L (ref 98–109)
GFR, Est AFR Am: >60 mL/min (ref >60)
GFR, Estimated: 59 mL/min — ABNORMAL LOW (ref >60)
GLUCOSE: 90 mg/dL (ref 70–140)
Potassium: 3.8 mmol/L (ref 3.5–5.1)
Sodium: 141 mmol/L (ref 136–145)
TOTAL PROTEIN: 6.9 g/dL (ref 6.4–8.3)

## 2017-10-09 LAB — CBC WITH DIFFERENTIAL (CANCER CENTER ONLY)
BASOS PCT: 1 %
Basophils Absolute: 0 10*3/uL (ref 0.0–0.1)
Eosinophils Absolute: 0.2 10*3/uL (ref 0.0–0.5)
Eosinophils Relative: 5 %
HCT: 35.9 % (ref 34.8–46.6)
HEMOGLOBIN: 12 g/dL (ref 11.6–15.9)
LYMPHS ABS: 0.4 10*3/uL — AB (ref 0.9–3.3)
Lymphocytes Relative: 12 %
MCH: 29.9 pg (ref 25.1–34.0)
MCHC: 33.5 g/dL (ref 31.5–36.0)
MCV: 89.4 fL (ref 79.5–101.0)
MONO ABS: 0.3 10*3/uL (ref 0.1–0.9)
MONOS PCT: 10 %
NEUTROS PCT: 72 %
Neutro Abs: 2.2 10*3/uL (ref 1.5–6.5)
Platelet Count: 205 10*3/uL (ref 145–400)
RBC: 4.02 MIL/uL (ref 3.70–5.45)
RDW: 14.6 % — AB (ref 11.2–14.5)
WBC Count: 3.1 10*3/uL — ABNORMAL LOW (ref 3.9–10.3)

## 2017-10-09 LAB — CEA (IN HOUSE-CHCC): CEA (CHCC-In House): 6.67 ng/mL — ABNORMAL HIGH (ref 0.00–5.00)

## 2017-10-09 MED ORDER — IRINOTECAN HCL CHEMO INJECTION 100 MG/5ML
180.0000 mg/m2 | Freq: Once | INTRAVENOUS | Status: AC
  Administered 2017-10-09: 320 mg via INTRAVENOUS
  Filled 2017-10-09: qty 16

## 2017-10-09 MED ORDER — DEXTROSE 5 % IV SOLN
400.0000 mg/m2 | Freq: Once | INTRAVENOUS | Status: AC
  Administered 2017-10-09: 712 mg via INTRAVENOUS
  Filled 2017-10-09: qty 35.6

## 2017-10-09 MED ORDER — FLUOROURACIL CHEMO INJECTION 2.5 GM/50ML
400.0000 mg/m2 | Freq: Once | INTRAVENOUS | Status: AC
  Administered 2017-10-09: 700 mg via INTRAVENOUS
  Filled 2017-10-09: qty 14

## 2017-10-09 MED ORDER — SODIUM CHLORIDE 0.9% FLUSH
10.0000 mL | INTRAVENOUS | Status: DC | PRN
  Administered 2017-10-09: 10 mL
  Filled 2017-10-09: qty 10

## 2017-10-09 MED ORDER — SODIUM CHLORIDE 0.9 % IV SOLN
Freq: Once | INTRAVENOUS | Status: AC
  Administered 2017-10-09: 10:00:00 via INTRAVENOUS

## 2017-10-09 MED ORDER — PALONOSETRON HCL INJECTION 0.25 MG/5ML
0.2500 mg | Freq: Once | INTRAVENOUS | Status: AC
  Administered 2017-10-09: 0.25 mg via INTRAVENOUS

## 2017-10-09 MED ORDER — SODIUM CHLORIDE 0.9 % IV SOLN
Freq: Once | INTRAVENOUS | Status: AC
  Administered 2017-10-09: 11:00:00 via INTRAVENOUS
  Filled 2017-10-09: qty 5

## 2017-10-09 MED ORDER — SODIUM CHLORIDE 0.9 % IV SOLN
2400.0000 mg/m2 | INTRAVENOUS | Status: DC
  Administered 2017-10-09: 4250 mg via INTRAVENOUS
  Filled 2017-10-09: qty 85

## 2017-10-09 MED ORDER — PALONOSETRON HCL INJECTION 0.25 MG/5ML
INTRAVENOUS | Status: AC
  Filled 2017-10-09: qty 5

## 2017-10-09 MED ORDER — ATROPINE SULFATE 1 MG/ML IJ SOLN
INTRAMUSCULAR | Status: AC
Start: 2017-10-09 — End: ?
  Filled 2017-10-09: qty 1

## 2017-10-09 MED ORDER — ATROPINE SULFATE 1 MG/ML IJ SOLN: 0.5000 mg | Freq: Once | INTRAMUSCULAR | Status: DC | PRN

## 2017-10-09 NOTE — Telephone Encounter (Signed)
Per 4/22 los- pt to get an updated schedule next visit.

## 2017-10-09 NOTE — Progress Notes (Signed)
[place] OFFICE PROGRESS NOTE   Diagnosis: Rectal cancer  INTERVAL HISTORY:   Ms. Arca returns as scheduled.  She completed another cycle of FOLFIRI on 09/25/2017.  She reports nausea following chemotherapy, no emesis.  No diarrhea.  She has constipation.  She complains of insomnia despite prazepam.  Objective:  Vital signs in last 24 hours:  Blood pressure 121/69, pulse 75, temperature 98.7 F (37.1 C), temperature source Oral, resp. rate 20, height _0  (1.626 m), weight 153 lb 11.2 oz (69.7 kg), SpO2 98 %.    HEENT: No thrush or ulcers Lymphatics: Tip of a firm lymph node in the medial left supraclavicular fossa Resp: Lungs clear bilaterally Cardio: Regular rate and rhythm GI: No hepatomegaly, nontender Vascular: No leg edema   Portacath/PICC-without erythema  Lab Results:  Lab Results  Component Value Date   WBC 3.1 (L) 10/09/2017   HGB 12.3 08/25/2017   HCT 35.9 10/09/2017   MCV 89.4 10/09/2017   PLT 205 10/09/2017   NEUTROABS 2.2 10/09/2017    CMP     Component Value Date/Time   NA 140 09/25/2017 0951   NA 138 11/08/2016 1205   K 4.1 09/25/2017 0951   K 4.2 11/08/2016 1205   CL 110 (H) 09/25/2017 0951   CO2 24 09/25/2017 0951   CO2 26 11/08/2016 1205   GLUCOSE 91 09/25/2017 0951   GLUCOSE 90 11/08/2016 1205   BUN 19 09/25/2017 0951   BUN 23.0 11/08/2016 1205   CREATININE 1.05 09/25/2017 0951   CREATININE 1.0 11/08/2016 1205   CALCIUM 9.4 09/25/2017 0951   CALCIUM 9.6 11/08/2016 1205   PROT 6.8 09/25/2017 0951   PROT 6.5 06/02/2014 1004   ALBUMIN 3.5 09/25/2017 0951   ALBUMIN 3.4 (L) 06/02/2014 1004   AST 12 09/25/2017 0951   AST 16 06/02/2014 1004   ALT 11 09/25/2017 0951   ALT 18 06/02/2014 1004   ALKPHOS 66 09/25/2017 0951   ALKPHOS 69 06/02/2014 1004   BILITOT 0.3 09/25/2017 0951   BILITOT 0.36 06/02/2014 1004   GFRNONAA 59 (L) 09/25/2017 0951   GFRAA >60 09/25/2017 0951    Lab Results  Component Value Date   CEA1 5.14 (H) 08/29/2017     Medications: I have reviewed the patient's current medications.   Assessment/Plan: 1. Rectal cancer, clinical stage III (T3 N1) status post biopsy of a rectal mass on 05/24/2013 confirming adenocarcinoma. Endoscopic ultrasound 05/31/2013 measured the mass at 11 cm from the anal verge, uT3uN1.  Initiation of radiation and concurrent Xeloda 06/18/2013; completion 07/26/2013.   Status post low anterior resection 09/02/2013. Invasive adenocarcinoma, 3.7 cm, negative margins; metastatic carcinoma in 7 of 11 lymph nodes; extensive lymph vascular involvement by tumor.MSI-stable, no loss of mismatch repair protein expression  Cycle 1 adjuvant FOLFOX 10/15/2013.   Cycle 2 adjuvant FOLFOX 10/29/2013.   Cycle 3 adjuvant FOLFOX 11/14/2013.   Cycle 4 adjuvant FOLFOX 11/27/2013.   Cycle 5 adjuvant FOLFOX 12/10/2013   Cycle 6 adjuvant FOLFOX 12/24/2013.   Cycle 7 adjuvant FOLFOX 01/14/2014.   Cycle 8 adjuvant FOLFOX 01/28/2014   Cycle 9 adjuvant FOLFOX 02/11/2014 (oxaliplatin dose reduced secondary to thrombocytopenia)   Surveillance CT scans 06/02/2014 with no evidence of recurrent rectal cancer  surveillance CT scans 06/16/2015 with no evidence of recurrent rectal cancer, 10 mm right external iliac node , increased amount of loculated air associated with the presacral soft tissue thickening , new left hydronephrosis  Surveillance CT scans 05/06/2016-negative for recurrent rectal cancer, decreased right  external iliac node, persistent left hydronephrosis  CT abdomen/pelvis 07/05/2017-new left para-aortic, aortocaval, right common iliac and right external iliac lymphadenopathy.  CT neck/chest 07/13/2017-enlarged left supraclavicular nodes, new paratracheal nodes  Ultrasound-guided biopsy of a left supraclavicular lymph node 07/25/2017-metastatic adenocarcinoma consistent with colorectal cancer,MSI-stable, tumor mutation burden-10, K-ras  S64mtation  Cycle 1 FOLFIRI 08/28/2017  Cycle 2 FOLFIRI 09/11/2017  Cycle 3 FOLFIRI 09/25/2017  Cycle 4 FOLFIRI 10/09/2017 2. Post colonoscopy bowel perforation confirmed on abdomen CT 05/28/2013. 3. Single indeterminate liver lesion on the abdomen CT 05/28/2013. MRI on 06/18/2013 confirmed 2 tiny liver lesions, indeterminate-favored to be benign cysts. 4. History of cervical and lumbar disc disease.  5. History of HELLP syndrome with first pregnancy.  6. Delayed nausea following cycle 1 FOLFOX. She receives Aloxi and Emend. She continues to experience delayed nausea. Prophylactic Decadron added with cycle 4. 7. History of pain with bowel movements. 8. Anastomotic leak on CT 09/13/2013. Status post exploratory laparotomy with drainage of a pelvic abscess, diverting loop ileostomy. Drainage catheter removed 10/23/2013.  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 Perineal numbness, urinary retention. The urinary retention has improved. 10. Perineal pain-improved. 11. Neutropenia secondary chemotherapy-she received Neulasta. 12. Urinary tract infection, Escherichia coli-11/03/2013. 13. Venous engorgement at the left anterior chest and left arm with swelling of the left arm and hand. Venous Doppler 11/14/2013 positive for DVT. She began Lovenox 11/14/2013 and was switched to xarelto 12/10/2013. Xarelto discontinued early January 2016. 14. Fever 11/20/2013. Blood and urine cultures negative. The fever resolved within 24 hours. She continued to have a single episode of fever following chemotherapy 15. History of a herpetic lip lesion 16. 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 17. Escherichia coli bacteremia, urinary source, 01/14/2014. She completed a 10 day course of ceftriaxone. 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 Anxiety/depression. 20. Port-A-Cath removal 06/09/2014. 21. Escherichia coli urinary tract infection 06/24/2014. She completed a course of ciprofloxacin. 22. frequent bowel movements following surgery/radiation -status post placement of a sacral stimulator 11/21/2014 23. CT 06/16/2015 with new left hydronephrosis status post cystoscopy with placement of a double-J stent 07/17/2015. No tumors or stones were seen in the bladder. Both ureteral orifices were unremarkable. On the left side retrograde study was performed. The distal ureter was narrowed and then a little bit more dilated proximally with hydronephrosis noted. There was no evidence of intramural tumor but the area did seem a bit strictured.  Stent removed, persistent hydronephrosis, followed by Dr. EAmalia Hailey CT 05/06/2016-increased left hydronephrosis  Ultrasound 09/09/2016-stable left hydroureteronephrosis  Left nephrectomy 04/28/2017  24.CT abdomen/pelvis 07/05/2017-new left para-aortic, aortocaval, right common iliac and right external iliac lymphadenopathy.    Disposition: Ms. BMincyhas completed 3 cycles of FOLFIRI.  She is tolerating the chemotherapy well other than prolonged nausea.  She will continue Decadron prophylaxis.  She will try lorazepam.  We will add Zofran to use after day 3.  She will return for an office visit and cycle 5 FOLFIRI on 10/23/2017.  She will be scheduled for a restaging CT after cycle 5.  She plans a 2-week vacation prior to completing the CT scan.  25 minutes were spent with the patient today.  The majority of the time was used for counseling and coordination of care.  GBetsy Coder MD  10/09/2017  9:45 AM

## 2017-10-09 NOTE — Patient Instructions (Signed)
[facility] Discharge Instructions for Patients Receiving Chemotherapy  Today you received the following chemotherapy agents irinotecan/leucovorin/florouracil   To help prevent nausea and vomiting after your treatment, we encourage you to take your nausea medication as directed  If you develop nausea and vomiting that is not controlled by your nausea medication, call the clinic.   BELOW ARE SYMPTOMS THAT SHOULD BE REPORTED IMMEDIATELY:  *FEVER GREATER THAN 100.5 F  *CHILLS WITH OR WITHOUT FEVER  NAUSEA AND VOMITING THAT IS NOT CONTROLLED WITH YOUR NAUSEA MEDICATION  *UNUSUAL SHORTNESS OF BREATH  *UNUSUAL BRUISING OR BLEEDING  TENDERNESS IN MOUTH AND THROAT WITH OR WITHOUT PRESENCE OF ULCERS  *URINARY PROBLEMS  *BOWEL PROBLEMS  UNUSUAL RASH Items with * indicate a potential emergency and should be followed up as soon as possible.  Feel free to call the clinic you have any questions or concerns. The clinic phone number is [phone].  

## 2017-10-10 ENCOUNTER — Other Ambulatory Visit: Payer: Self-pay

## 2017-10-10 DIAGNOSIS — C20 Malignant neoplasm of rectum: Secondary | ICD-10-CM

## 2017-10-10 MED ORDER — ONDANSETRON HCL 8 MG PO TABS: 8.0000 mg | ORAL_TABLET | Freq: Three times a day (TID) | ORAL | 0 refills | Status: DC | PRN

## 2017-10-11 ENCOUNTER — Telehealth: Payer: Self-pay | Admitting: Oncology

## 2017-10-11 ENCOUNTER — Inpatient Hospital Stay: Payer: 59

## 2017-10-11 VITALS — BP 110/70 | HR 84 | Temp 98.3°F | Resp 20

## 2017-10-11 DIAGNOSIS — C20 Malignant neoplasm of rectum: Secondary | ICD-10-CM

## 2017-10-11 DIAGNOSIS — Z5111 Encounter for antineoplastic chemotherapy: Secondary | ICD-10-CM | POA: Diagnosis not present

## 2017-10-11 MED ORDER — HEPARIN SOD (PORK) LOCK FLUSH 100 UNIT/ML IV SOLN
500.0000 [IU] | Freq: Once | INTRAVENOUS | Status: AC | PRN
  Administered 2017-10-11: 500 [IU]
  Filled 2017-10-11: qty 5

## 2017-10-11 MED ORDER — SODIUM CHLORIDE 0.9% FLUSH
10.0000 mL | INTRAVENOUS | Status: DC | PRN
Start: 2017-10-11 — End: 2017-10-11
  Administered 2017-10-11: 10 mL
  Filled 2017-10-11: qty 10

## 2017-10-11 NOTE — Telephone Encounter (Signed)
Faxed records to [facility]

## 2017-10-21 ENCOUNTER — Other Ambulatory Visit: Payer: Self-pay | Admitting: Oncology

## 2017-10-23 ENCOUNTER — Encounter: Payer: Self-pay | Admitting: Nurse Practitioner

## 2017-10-23 ENCOUNTER — Telehealth: Payer: Self-pay

## 2017-10-23 ENCOUNTER — Inpatient Hospital Stay: Payer: 59

## 2017-10-23 ENCOUNTER — Inpatient Hospital Stay (HOSPITAL_BASED_OUTPATIENT_CLINIC_OR_DEPARTMENT_OTHER): Payer: 59 | Admitting: Nurse Practitioner

## 2017-10-23 ENCOUNTER — Inpatient Hospital Stay: Payer: 59 | Attending: Nurse Practitioner

## 2017-10-23 ENCOUNTER — Telehealth: Payer: Self-pay | Admitting: Oncology

## 2017-10-23 VITALS — BP 117/95 | HR 80 | Temp 99.0°F | Resp 18 | Ht 64.0 in | Wt 153.5 lb

## 2017-10-23 DIAGNOSIS — N390 Urinary tract infection, site not specified: Secondary | ICD-10-CM

## 2017-10-23 DIAGNOSIS — C770 Secondary and unspecified malignant neoplasm of lymph nodes of head, face and neck: Secondary | ICD-10-CM | POA: Insufficient documentation

## 2017-10-23 DIAGNOSIS — Z5111 Encounter for antineoplastic chemotherapy: Secondary | ICD-10-CM | POA: Insufficient documentation

## 2017-10-23 DIAGNOSIS — Z95828 Presence of other vascular implants and grafts: Secondary | ICD-10-CM

## 2017-10-23 DIAGNOSIS — C20 Malignant neoplasm of rectum: Secondary | ICD-10-CM | POA: Insufficient documentation

## 2017-10-23 DIAGNOSIS — Z452 Encounter for adjustment and management of vascular access device: Secondary | ICD-10-CM | POA: Diagnosis not present

## 2017-10-23 DIAGNOSIS — R319 Hematuria, unspecified: Principal | ICD-10-CM

## 2017-10-23 LAB — CBC WITH DIFFERENTIAL (CANCER CENTER ONLY)
BASOS ABS: 0 10*3/uL (ref 0.0–0.1)
BASOS PCT: 1 %
Eosinophils Absolute: 0.2 10*3/uL (ref 0.0–0.5)
Eosinophils Relative: 6 %
HEMATOCRIT: 36.3 % (ref 34.8–46.6)
Hemoglobin: 12.2 g/dL (ref 11.6–15.9)
LYMPHS PCT: 15 %
Lymphs Abs: 0.5 10*3/uL — ABNORMAL LOW (ref 0.9–3.3)
MCH: 30.6 pg (ref 25.1–34.0)
MCHC: 33.7 g/dL (ref 31.5–36.0)
MCV: 90.9 fL (ref 79.5–101.0)
Monocytes Absolute: 0.3 10*3/uL (ref 0.1–0.9)
Monocytes Relative: 10 %
NEUTROS ABS: 2.2 10*3/uL (ref 1.5–6.5)
NEUTROS PCT: 68 %
PLATELETS: 199 10*3/uL (ref 145–400)
RBC: 4 MIL/uL (ref 3.70–5.45)
RDW: 16.5 % — ABNORMAL HIGH (ref 11.2–14.5)
WBC: 3.2 10*3/uL — AB (ref 3.9–10.3)

## 2017-10-23 LAB — CMP (CANCER CENTER ONLY)
ALBUMIN: 3.9 g/dL (ref 3.5–5.0)
ALT: 10 U/L (ref 0–55)
ANION GAP: 5 (ref 3–11)
AST: 11 U/L (ref 5–34)
Alkaline Phosphatase: 64 U/L (ref 40–150)
BILIRUBIN TOTAL: 0.3 mg/dL (ref 0.2–1.2)
BUN: 19 mg/dL (ref 7–26)
CO2: 26 mmol/L (ref 22–29)
Calcium: 9.6 mg/dL (ref 8.4–10.4)
Chloride: 107 mmol/L (ref 98–109)
Creatinine: 1.15 mg/dL — ABNORMAL HIGH (ref 0.60–1.10)
GFR, EST NON AFRICAN AMERICAN: 53 mL/min — AB (ref >60)
Glucose, Bld: 81 mg/dL (ref 70–140)
Potassium: 4 mmol/L (ref 3.5–5.1)
Sodium: 138 mmol/L (ref 136–145)
TOTAL PROTEIN: 6.9 g/dL (ref 6.4–8.3)

## 2017-10-23 LAB — CEA (IN HOUSE-CHCC): CEA (CHCC-In House): 6.87 ng/mL — ABNORMAL HIGH (ref 0.00–5.00)

## 2017-10-23 MED ORDER — SODIUM CHLORIDE 0.9 % IV SOLN
2400.0000 mg/m2 | INTRAVENOUS | Status: DC
  Administered 2017-10-23: 4250 mg via INTRAVENOUS
  Filled 2017-10-23: qty 85

## 2017-10-23 MED ORDER — ALTEPLASE 2 MG IJ SOLR
2.0000 mg | Freq: Once | INTRAMUSCULAR | Status: DC | PRN
  Filled 2017-10-23: qty 2

## 2017-10-23 MED ORDER — ATROPINE SULFATE 1 MG/ML IJ SOLN
0.5000 mg | Freq: Once | INTRAMUSCULAR | Status: AC | PRN
Start: 2017-10-23 — End: 2017-10-23
  Administered 2017-10-23: 0.5 mg via INTRAVENOUS

## 2017-10-23 MED ORDER — ALTEPLASE 2 MG IJ SOLR
2.0000 mg | Freq: Once | INTRAMUSCULAR | Status: AC | PRN
  Administered 2017-10-23: 2 mg
  Filled 2017-10-23: qty 2

## 2017-10-23 MED ORDER — ALPRAZOLAM 0.5 MG PO TABS: 0.5000 mg | ORAL_TABLET | Freq: Every evening | ORAL | 0 refills | Status: DC | PRN

## 2017-10-23 MED ORDER — FOSAPREPITANT DIMEGLUMINE INJECTION 150 MG
Freq: Once | INTRAVENOUS | Status: AC
  Administered 2017-10-23: 13:00:00 via INTRAVENOUS
  Filled 2017-10-23: qty 5

## 2017-10-23 MED ORDER — ATROPINE SULFATE 1 MG/ML IJ SOLN
INTRAMUSCULAR | Status: AC
  Filled 2017-10-23: qty 1

## 2017-10-23 MED ORDER — PALONOSETRON HCL INJECTION 0.25 MG/5ML
0.2500 mg | Freq: Once | INTRAVENOUS | Status: AC
  Administered 2017-10-23: 0.25 mg via INTRAVENOUS

## 2017-10-23 MED ORDER — SODIUM CHLORIDE 0.9% FLUSH
10.0000 mL | INTRAVENOUS | Status: DC | PRN
  Administered 2017-10-23: 10 mL
  Filled 2017-10-23: qty 10

## 2017-10-23 MED ORDER — SODIUM CHLORIDE 0.9 % IV SOLN
Freq: Once | INTRAVENOUS | Status: AC
  Administered 2017-10-23: 12:00:00 via INTRAVENOUS

## 2017-10-23 MED ORDER — IRINOTECAN HCL CHEMO INJECTION 100 MG/5ML
180.0000 mg/m2 | Freq: Once | INTRAVENOUS | Status: AC
  Administered 2017-10-23: 320 mg via INTRAVENOUS
  Filled 2017-10-23: qty 16

## 2017-10-23 MED ORDER — FLUOROURACIL CHEMO INJECTION 2.5 GM/50ML
400.0000 mg/m2 | Freq: Once | INTRAVENOUS | Status: AC
  Administered 2017-10-23: 700 mg via INTRAVENOUS
  Filled 2017-10-23: qty 14

## 2017-10-23 MED ORDER — PALONOSETRON HCL INJECTION 0.25 MG/5ML
INTRAVENOUS | Status: AC
Start: 2017-10-23 — End: ?
  Filled 2017-10-23: qty 5

## 2017-10-23 MED ORDER — LEUCOVORIN CALCIUM INJECTION 350 MG
400.0000 mg/m2 | Freq: Once | INTRAMUSCULAR | Status: AC
  Administered 2017-10-23: 712 mg via INTRAVENOUS
  Filled 2017-10-23: qty 35.6

## 2017-10-23 MED ORDER — ALTEPLASE 2 MG IJ SOLR
INTRAMUSCULAR | Status: AC
  Filled 2017-10-23: qty 2

## 2017-10-23 NOTE — Patient Instructions (Signed)
[facility] Discharge Instructions for Patients Receiving Chemotherapy  Today you received the following chemotherapy agents Irinotecan, Lecuovorin, Adrucil  To help prevent nausea and vomiting after your treatment, we encourage you to take your nausea medication as directed   If you develop nausea and vomiting that is not controlled by your nausea medication, call the clinic.   BELOW ARE SYMPTOMS THAT SHOULD BE REPORTED IMMEDIATELY:  *FEVER GREATER THAN 100.5 F  *CHILLS WITH OR WITHOUT FEVER  NAUSEA AND VOMITING THAT IS NOT CONTROLLED WITH YOUR NAUSEA MEDICATION  *UNUSUAL SHORTNESS OF BREATH  *UNUSUAL BRUISING OR BLEEDING  TENDERNESS IN MOUTH AND THROAT WITH OR WITHOUT PRESENCE OF ULCERS  *URINARY PROBLEMS  *BOWEL PROBLEMS  UNUSUAL RASH Items with * indicate a potential emergency and should be followed up as soon as possible.  Feel free to call the clinic should you have any questions or concerns. The clinic phone number is [phone].  Please show the [facility] at check-in to the Emergency Department and triage nurse.

## 2017-10-23 NOTE — Progress Notes (Signed)
1155   -   Approx 8 ml of blood obtained from right chest pac and wasted.  Normal saline infusing at  [facility] rate. Per Lattie Haw, NP -  OK to treat with Crea  1.15.

## 2017-10-23 NOTE — Progress Notes (Signed)
[place] OFFICE PROGRESS NOTE   Diagnosis: Rectal cancer  INTERVAL HISTORY:   Ms. Nippert returns as scheduled.  She completed cycle 4 FOLFIRI 10/09/2017.  She noted that she was queasy after the chemotherapy.  No vomiting.  She has noted that her gums "hurt".  No ulcers.  No diarrhea.  She tends to develop constipation after the chemotherapy.  She notes a burning sensation on her face after the chemotherapy.  She is no longer having abdominal pain.  Objective:  Vital signs in last 24 hours:  Blood pressure (!) 117/95, pulse 80, temperature 99 F (37.2 C), temperature source Oral, resp. rate 18, height 5' 4"  (1.626 m), weight 153 lb 8 oz (69.6 kg), SpO2 99 %.    HEENT: No thrush or ulcers. Lymphatics: No palpable adenopathy in the left supraclavicular region. Resp: Lungs clear bilaterally. Cardio: Regular rate and rhythm. GI: Abdomen soft and nontender.  No hepatomegaly. Vascular: No leg edema. Port-A-Cath without erythema.  Lab Results:  Lab Results  Component Value Date   WBC 3.2 (L) 10/23/2017   HGB 12.2 10/23/2017   HCT 36.3 10/23/2017   MCV 90.9 10/23/2017   PLT 199 10/23/2017   NEUTROABS 2.2 10/23/2017    Imaging:  No results found.  Medications: I have reviewed the patient's current medications.  Assessment/Plan: 1. Rectal cancer, clinical stage III (T3 N1) status post biopsy of a rectal mass on 05/24/2013 confirming adenocarcinoma. Endoscopic ultrasound 05/31/2013 measured the mass at 11 cm from the anal verge, uT3uN1.  Initiation of radiation and concurrent Xeloda 06/18/2013; completion 07/26/2013.   Status post low anterior resection 09/02/2013. Invasive adenocarcinoma, 3.7 cm, negative margins; metastatic carcinoma in 7 of 11 lymph nodes; extensive lymph vascular involvement by tumor.MSI-stable, no loss of mismatch repair protein expression  Cycle 1 adjuvant FOLFOX 10/15/2013.   Cycle 2 adjuvant FOLFOX 10/29/2013.   Cycle 3  adjuvant FOLFOX 11/14/2013.   Cycle 4 adjuvant FOLFOX 11/27/2013.   Cycle 5 adjuvant FOLFOX 12/10/2013   Cycle 6 adjuvant FOLFOX 12/24/2013.   Cycle 7 adjuvant FOLFOX 01/14/2014.   Cycle 8 adjuvant FOLFOX 01/28/2014   Cycle 9 adjuvant FOLFOX 02/11/2014 (oxaliplatin dose reduced secondary to thrombocytopenia)   Surveillance CT scans 06/02/2014 with no evidence of recurrent rectal cancer  surveillance CT scans 06/16/2015 with no evidence of recurrent rectal cancer, 10 mm right external iliac node , increased amount of loculated air associated with the presacral soft tissue thickening , new left hydronephrosis  Surveillance CT scans 05/06/2016-negative for recurrent rectal cancer, decreased right external iliac node, persistent left hydronephrosis  CT abdomen/pelvis 07/05/2017-new left para-aortic, aortocaval, right common iliac and right external iliac lymphadenopathy.  CT neck/chest 07/13/2017-enlarged left supraclavicular nodes, new paratracheal nodes  Ultrasound-guided biopsy of a left supraclavicular lymph node 07/25/2017-metastatic adenocarcinoma consistent with colorectal cancer,MSI-stable, tumor mutation burden-10, K-ras S51mtation  Cycle 1 FOLFIRI 08/28/2017  Cycle 2 FOLFIRI 09/11/2017  Cycle 3 FOLFIRI 09/25/2017  Cycle 4 FOLFIRI 10/09/2017  Cycle 5 FOLFIRI 10/23/2017 2. Post colonoscopy bowel perforation confirmed on abdomen CT 05/28/2013. 3. Single indeterminate liver lesion on the abdomen CT 05/28/2013. MRI on 06/18/2013 confirmed 2 tiny liver lesions, indeterminate-favored to be benign cysts. 4. History of cervical and lumbar disc disease.  5. History of HELLP syndrome with first pregnancy.  6. Delayed nausea following cycle 1 FOLFOX. She receives Aloxi and Emend. She continues to experience delayed nausea. Prophylactic Decadron added with cycle 4. 7. History of pain with bowel movements. 8. Anastomotic leak on CT 09/13/2013. Status post  exploratory laparotomy  with drainage of a pelvic abscess, diverting loop ileostomy. Drainage catheter removed 10/23/2013.  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 Perineal numbness, urinary retention. The urinary retention has improved. 10. Perineal pain-improved. 11. Neutropenia secondary chemotherapy-she received Neulasta. 12. Urinary tract infection, Escherichia coli-11/03/2013. 13. Venous engorgement at the left anterior chest and left arm with swelling of the left arm and hand. Venous Doppler 11/14/2013 positive for DVT. She began Lovenox 11/14/2013 and was switched to xarelto 12/10/2013. Xarelto discontinued early January 2016. 14. Fever 11/20/2013. Blood and urine cultures negative. The fever resolved within 24 hours. She continued to have a single episode of fever following chemotherapy 15. History of a herpetic lip lesion 16. 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 17. Escherichia coli bacteremia, urinary source, 01/14/2014. She completed a 10 day course of ceftriaxone. 24. 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 Anxiety/depression. 20. Port-A-Cath removal 06/09/2014. 21. Escherichia coli urinary tract infection 06/24/2014. She completed a course of ciprofloxacin. 22. frequent bowel movements following surgery/radiation -status post placement of a sacral stimulator 11/21/2014 23. CT 06/16/2015 with new left hydronephrosis status post cystoscopy with placement of a double-J stent 07/17/2015. No tumors or stones were seen in the bladder. Both ureteral orifices were unremarkable. On the left side retrograde study was performed. The distal ureter was narrowed and then a little bit more dilated proximally with hydronephrosis noted. There was no evidence of intramural tumor but the area did seem a bit strictured.  Stent removed, persistent hydronephrosis,  followed by Dr. Amalia Hailey  CT 05/06/2016-increased left hydronephrosis  Ultrasound 09/09/2016-stable left hydroureteronephrosis  Left nephrectomy 04/28/2017  24.CT abdomen/pelvis 07/05/2017-new left para-aortic, aortocaval, right common iliac and right external iliac lymphadenopathy.    Disposition: Ms. Postma appears stable.  She has completed 4 cycles of FOLFIRI.  Plan to proceed with cycle 5 today as scheduled.  She will undergo restaging CT scans 11/14/2017.  She will return for lab and follow-up on 11/14/2017.  She will contact the office in the interim with any problems.    Ned Card ANP/GNP-BC   10/23/2017  11:20 AM

## 2017-10-23 NOTE — Telephone Encounter (Signed)
Called in Xanax prescription per Ned Card, NP

## 2017-10-23 NOTE — Progress Notes (Signed)
No Blood return noted from port. Patient returned to lab for peripheral draw. Desk RN notified to administer cathflo as patient has treatment today. Cathflo released.

## 2017-10-23 NOTE — Telephone Encounter (Signed)
Scheduled appt per 5/6 los - pt to get an updated schedule next visit.  

## 2017-10-25 ENCOUNTER — Inpatient Hospital Stay: Payer: 59

## 2017-10-25 VITALS — BP 104/68 | HR 87 | Temp 98.0°F | Resp 18

## 2017-10-25 DIAGNOSIS — Z5111 Encounter for antineoplastic chemotherapy: Secondary | ICD-10-CM | POA: Diagnosis not present

## 2017-10-25 DIAGNOSIS — C20 Malignant neoplasm of rectum: Secondary | ICD-10-CM

## 2017-10-25 MED ORDER — HEPARIN SOD (PORK) LOCK FLUSH 100 UNIT/ML IV SOLN
500.0000 [IU] | Freq: Once | INTRAVENOUS | Status: AC | PRN
  Administered 2017-10-25: 500 [IU]
  Filled 2017-10-25: qty 5

## 2017-10-25 MED ORDER — SODIUM CHLORIDE 0.9% FLUSH
10.0000 mL | INTRAVENOUS | Status: DC | PRN
  Administered 2017-10-25: 10 mL
  Filled 2017-10-25: qty 10

## 2017-10-26 ENCOUNTER — Telehealth: Payer: Self-pay

## 2017-10-26 NOTE — Telephone Encounter (Signed)
Called pt I regards to RX refill for Ativan. Pt last refill was for Xanax. Per Ned Card NP call pt to see which medication pt is taking because she cannot take both. Per pt she prefers to take Ativan.

## 2017-10-27 ENCOUNTER — Other Ambulatory Visit: Payer: Self-pay | Admitting: Nurse Practitioner

## 2017-10-27 ENCOUNTER — Telehealth: Payer: Self-pay

## 2017-10-27 DIAGNOSIS — C20 Malignant neoplasm of rectum: Secondary | ICD-10-CM

## 2017-10-27 MED ORDER — LORAZEPAM 0.5 MG PO TABS: ORAL_TABLET | ORAL | 0 refills | Status: DC

## 2017-10-27 NOTE — Telephone Encounter (Addendum)
Spoke with CT and advised that pt has one kidney per note below.   ----- Message from Owens Shark, NP sent at 10/27/2017 11:02 AM EDT ----- She is scheduled for CT scans 11/14/2017.  Please let radiology know she only has one kidney.

## 2017-11-13 ENCOUNTER — Other Ambulatory Visit: Payer: Self-pay | Admitting: Oncology

## 2017-11-14 ENCOUNTER — Telehealth: Payer: Self-pay | Admitting: Oncology

## 2017-11-14 ENCOUNTER — Ambulatory Visit (HOSPITAL_COMMUNITY)
Admission: RE | Admit: 2017-11-14 | Discharge: 2017-11-14 | Disposition: A | Discharge status: Home / Self Care | Payer: 59 | Source: Ambulatory Visit | Attending: Oncology | Admitting: Oncology

## 2017-11-14 ENCOUNTER — Inpatient Hospital Stay: Payer: 59

## 2017-11-14 ENCOUNTER — Inpatient Hospital Stay (HOSPITAL_BASED_OUTPATIENT_CLINIC_OR_DEPARTMENT_OTHER): Payer: 59 | Admitting: Oncology

## 2017-11-14 VITALS — BP 109/82 | HR 73 | Temp 98.3°F | Resp 18 | Ht 64.0 in | Wt 154.2 lb

## 2017-11-14 DIAGNOSIS — Z5111 Encounter for antineoplastic chemotherapy: Secondary | ICD-10-CM | POA: Diagnosis not present

## 2017-11-14 DIAGNOSIS — Z95828 Presence of other vascular implants and grafts: Secondary | ICD-10-CM

## 2017-11-14 DIAGNOSIS — C20 Malignant neoplasm of rectum: Secondary | ICD-10-CM

## 2017-11-14 DIAGNOSIS — R319 Hematuria, unspecified: Secondary | ICD-10-CM

## 2017-11-14 DIAGNOSIS — C770 Secondary and unspecified malignant neoplasm of lymph nodes of head, face and neck: Secondary | ICD-10-CM

## 2017-11-14 DIAGNOSIS — R590 Localized enlarged lymph nodes: Secondary | ICD-10-CM | POA: Insufficient documentation

## 2017-11-14 DIAGNOSIS — N390 Urinary tract infection, site not specified: Secondary | ICD-10-CM

## 2017-11-14 LAB — CBC WITH DIFFERENTIAL (CANCER CENTER ONLY)
BASOS ABS: 0 10*3/uL (ref 0.0–0.1)
BASOS PCT: 1 %
EOS ABS: 0.2 10*3/uL (ref 0.0–0.5)
EOS PCT: 5 %
HCT: 36.4 % (ref 34.8–46.6)
HEMOGLOBIN: 12.2 g/dL (ref 11.6–15.9)
Lymphocytes Relative: 13 %
Lymphs Abs: 0.5 10*3/uL — ABNORMAL LOW (ref 0.9–3.3)
MCH: 31.7 pg (ref 25.1–34.0)
MCHC: 33.6 g/dL (ref 31.5–36.0)
MCV: 94.4 fL (ref 79.5–101.0)
Monocytes Absolute: 0.4 10*3/uL (ref 0.1–0.9)
Monocytes Relative: 11 %
NEUTROS PCT: 70 %
Neutro Abs: 2.8 10*3/uL (ref 1.5–6.5)
PLATELETS: 236 10*3/uL (ref 145–400)
RBC: 3.86 MIL/uL (ref 3.70–5.45)
RDW: 18.5 % — ABNORMAL HIGH (ref 11.2–14.5)
WBC Count: 3.9 10*3/uL (ref 3.9–10.3)

## 2017-11-14 LAB — COMPREHENSIVE METABOLIC PANEL
ALBUMIN: 3.9 g/dL (ref 3.5–5.0)
ALT: 16 U/L (ref 14–54)
AST: 18 U/L (ref 15–41)
Alkaline Phosphatase: 56 U/L (ref 38–126)
Anion gap: 9 (ref 5–15)
BUN: 18 mg/dL (ref 6–20)
CHLORIDE: 106 mmol/L (ref 101–111)
CO2: 23 mmol/L (ref 22–32)
Calcium: 9 mg/dL (ref 8.9–10.3)
Creatinine, Ser: 1.02 mg/dL — ABNORMAL HIGH (ref 0.44–1.00)
GFR calc Af Amer: >60 mL/min (ref >60)
Glucose, Bld: 93 mg/dL (ref 65–99)
POTASSIUM: 4 mmol/L (ref 3.5–5.1)
SODIUM: 138 mmol/L (ref 135–145)
Total Bilirubin: 0.5 mg/dL (ref 0.3–1.2)
Total Protein: 7 g/dL (ref 6.5–8.1)

## 2017-11-14 LAB — CEA (IN HOUSE-CHCC): CEA (CHCC-IN HOUSE): 6.5 ng/mL — AB (ref 0.00–5.00)

## 2017-11-14 MED ORDER — IOPAMIDOL (ISOVUE-300) INJECTION 61%
100.0000 mL | Freq: Once | INTRAVENOUS | Status: AC | PRN
  Administered 2017-11-14: 100 mL via INTRAVENOUS

## 2017-11-14 MED ORDER — PALONOSETRON HCL INJECTION 0.25 MG/5ML
0.2500 mg | Freq: Once | INTRAVENOUS | Status: AC
  Administered 2017-11-14: 0.25 mg via INTRAVENOUS

## 2017-11-14 MED ORDER — PALONOSETRON HCL INJECTION 0.25 MG/5ML
INTRAVENOUS | Status: AC
  Filled 2017-11-14: qty 5

## 2017-11-14 MED ORDER — ATROPINE SULFATE 1 MG/ML IJ SOLN
INTRAMUSCULAR | Status: AC
Start: 2017-11-14 — End: ?
  Filled 2017-11-14: qty 1

## 2017-11-14 MED ORDER — ATROPINE SULFATE 1 MG/ML IJ SOLN
0.5000 mg | Freq: Once | INTRAMUSCULAR | Status: AC | PRN
  Administered 2017-11-14: 0.5 mg via INTRAVENOUS

## 2017-11-14 MED ORDER — FLUOROURACIL CHEMO INJECTION 2.5 GM/50ML
400.0000 mg/m2 | Freq: Once | INTRAVENOUS | Status: AC
  Administered 2017-11-14: 700 mg via INTRAVENOUS
  Filled 2017-11-14: qty 14

## 2017-11-14 MED ORDER — SODIUM CHLORIDE 0.9 % IV SOLN
2400.0000 mg/m2 | INTRAVENOUS | Status: DC
  Administered 2017-11-14: 4250 mg via INTRAVENOUS
  Filled 2017-11-14: qty 85

## 2017-11-14 MED ORDER — DEXTROSE 5 % IV SOLN
180.0000 mg/m2 | Freq: Once | INTRAVENOUS | Status: AC
  Administered 2017-11-14: 320 mg via INTRAVENOUS
  Filled 2017-11-14: qty 16

## 2017-11-14 MED ORDER — SODIUM CHLORIDE 0.9% FLUSH
10.0000 mL | INTRAVENOUS | Status: DC | PRN
  Administered 2017-11-14: 10 mL
  Filled 2017-11-14: qty 10

## 2017-11-14 MED ORDER — DEXTROSE 5 % IV SOLN
400.0000 mg/m2 | Freq: Once | INTRAVENOUS | Status: AC
  Administered 2017-11-14: 712 mg via INTRAVENOUS
  Filled 2017-11-14: qty 35.6

## 2017-11-14 MED ORDER — SODIUM CHLORIDE 0.9 % IV SOLN
Freq: Once | INTRAVENOUS | Status: AC
  Administered 2017-11-14: 13:00:00 via INTRAVENOUS

## 2017-11-14 MED ORDER — SODIUM CHLORIDE 0.9 % IV SOLN
Freq: Once | INTRAVENOUS | Status: AC
  Administered 2017-11-14: 13:00:00 via INTRAVENOUS
  Filled 2017-11-14: qty 5

## 2017-11-14 MED ORDER — IOPAMIDOL (ISOVUE-300) INJECTION 61%
INTRAVENOUS | Status: AC
  Filled 2017-11-14: qty 100

## 2017-11-14 NOTE — Telephone Encounter (Signed)
Gave patient avs report and appointments for May/June.

## 2017-11-14 NOTE — Patient Instructions (Signed)
[facility] Discharge Instructions for Patients Receiving Chemotherapy  Today you received the following chemotherapy agents Irinotecan, Lecuovorin, Adrucil  To help prevent nausea and vomiting after your treatment, we encourage you to take your nausea medication as directed   If you develop nausea and vomiting that is not controlled by your nausea medication, call the clinic.   BELOW ARE SYMPTOMS THAT SHOULD BE REPORTED IMMEDIATELY:  *FEVER GREATER THAN 100.5 F  *CHILLS WITH OR WITHOUT FEVER  NAUSEA AND VOMITING THAT IS NOT CONTROLLED WITH YOUR NAUSEA MEDICATION  *UNUSUAL SHORTNESS OF BREATH  *UNUSUAL BRUISING OR BLEEDING  TENDERNESS IN MOUTH AND THROAT WITH OR WITHOUT PRESENCE OF ULCERS  *URINARY PROBLEMS  *BOWEL PROBLEMS  UNUSUAL RASH Items with * indicate a potential emergency and should be followed up as soon as possible.  Feel free to call the clinic should you have any questions or concerns. The clinic phone number is [phone].  Please show the [facility] at check-in to the Emergency Department and triage nurse.

## 2017-11-14 NOTE — Progress Notes (Signed)
[facility] OFFICE PROGRESS NOTE   Diagnosis: Rectal cancer  INTERVAL HISTORY:   [name] completed another cycle of FOLFIRI 10/23/2017.  She had diarrhea following chemotherapy.  She returned from a trip to [place] yesterday.  She was active on the trip.  She had an episode of right groin pain on 11/12/2017.  She reports being seen at a hospital in [place].  A Doppler of the right leg was negative.  The pain has resolved.   Objective:  Vital signs in last 24 hours:  There were no vitals taken for this visit.    HEENT: No thrush or ulcers Lymphatics: Palpable tip of a firm lymph node at the medial left lower neck Resp: Lungs clear bilaterally Cardio: Regular rate and rhythm GI: No hepatosplenomegaly, no mass, nontender, no apparent abnormalities right groin Vascular: No leg edema   Portacath/PICC-without erythema  Lab Results:  Lab Results  Component Value Date   WBC 3.2 (L) 10/23/2017   HGB 12.2 10/23/2017   HCT 36.3 10/23/2017   MCV 90.9 10/23/2017   PLT 199 10/23/2017   NEUTROABS 2.2 10/23/2017    CMP  Lab Results  Component Value Date   NA 138 10/23/2017   K 4.0 10/23/2017   CL 107 10/23/2017   CO2 26 10/23/2017   GLUCOSE 81 10/23/2017   BUN 19 10/23/2017   CREATININE 1.15 (H) 10/23/2017   CALCIUM 9.6 10/23/2017   PROT 6.9 10/23/2017   ALBUMIN 3.9 10/23/2017   AST 11 10/23/2017   ALT 10 10/23/2017   ALKPHOS 64 10/23/2017   BILITOT 0.3 10/23/2017   GFRNONAA 53 (L) 10/23/2017   GFRAA >60 10/23/2017    Lab Results  Component Value Date   CEA1 6.87 (H) 10/23/2017    Lab Results  Component Value Date   INR 0.97 08/25/2017    Imaging:  Ct Soft Tissue Neck W Contrast  Result Date: 11/14/2017 CLINICAL DATA:  Rectal cancer.  Follow-up metastatic adenopathy. EXAM: CT NECK WITH CONTRAST TECHNIQUE: Multidetector CT imaging of the neck was performed using the standard protocol following the bolus administration of intravenous contrast.  CONTRAST:  19m ISOVUE-300 IOPAMIDOL (ISOVUE-300) INJECTION 61% COMPARISON:  CT neck 07/13/2017 FINDINGS: Pharynx and larynx: No focal mucosal or submucosal lesions are present. Nasopharynx is within normal limits. Soft palate is normal. Tongue base is within normal limits. Epiglottis is unremarkable. Vocal cords are midline and symmetric. Salivary glands: Submandibular and parotid glands are normal bilaterally. Thyroid: Normal Lymph nodes: 2 left supraclavicular lymph nodes are again noted. The nodes are inseparable. Maximal long axis measurement is now 23.5 mm. This compares with 27 mm previously. Maximal short access measurement is now 12.0 mm, compared with 13.5 mm. No new lymph nodes are present. Vascular: Negative. Limited intracranial: Within normal limits Visualized orbits: Unremarkable. Mastoids and visualized paranasal sinuses: The visualized paranasal sinuses and the mastoid air cells are clear. Skeleton: Previous cervical spine surgery is noted at C5-6. Vertebral body heights and alignment are normal otherwise. No focal lytic or blastic lesions are present. Upper chest: The lung apices are clear. Please see dedicated CT of the chest from the same day. IMPRESSION: 1. Slight decrease and left supraclavicular adenopathy suggesting response to therapy. 2. No new nodes. Electronically Signed   By: CSan MorelleM.D.   On: 11/14/2017 08:42   Ct Chest W Contrast  Result Date: 11/14/2017 CLINICAL DATA:  Patient with history of rectal carcinoma diagnosed in 2014. Metastatic to the lymph nodes. EXAM: CT CHEST, ABDOMEN,  AND PELVIS WITH CONTRAST TECHNIQUE: Multidetector CT imaging of the chest, abdomen and pelvis was performed following the standard protocol during bolus administration of intravenous contrast. CONTRAST:  137m ISOVUE-300 IOPAMIDOL (ISOVUE-300) INJECTION 61% COMPARISON:  Chest CT 07/13/2017; abdomen pelvis CT 07/05/2017. FINDINGS: CT CHEST FINDINGS Cardiovascular: Right anterior chest  wall Port-A-Cath is present with tip terminating in the superior vena cava. Normal heart size. No pericardial effusion. Mediastinum/Nodes: Mild interval increase in size of precarinal lymph node measuring 1.3 cm (image 19; series 2), previously 1.1 cm. Similar right paratracheal lymph node measuring 0.7 cm (image 15; series 2), previously 0.9 cm. Similar 1.2 cm left supraclavicular lymph node (image 3; series 2), previously 1.2 cm. Stable appearance of the esophagus. No axillary adenopathy. Lungs/Pleura: Central airways are patent. Dependent atelectasis within the bilateral lower lobes. Musculoskeletal: Thoracic spine degenerative changes. No aggressive or acute appearing osseous lesions. CT ABDOMEN PELVIS FINDINGS Hepatobiliary: Liver is normal in size and contour. Stable subcentimeter low-attenuation lesion within the hepatic dome (image 43; series 2). Gallbladder is unremarkable. No intrahepatic or extrahepatic biliary ductal dilatation. Pancreas: Unremarkable Spleen: Unremarkable Adrenals/Urinary Tract: Adrenal glands are normal. Left kidney is surgically absent. Right kidney is normal in appearance. No hydronephrosis. Urinary bladder is decompressed. Stomach/Bowel: Stable presacral soft tissue thickening with small amount of associated gas. Postsurgical changes compatible with low anterior resection and colorectal anastomosis. Oral contrast material to the level of the rectum. Normal morphology of the stomach. No evidence for small bowel obstruction. No free fluid or free intraperitoneal air. Vascular/Lymphatic: Stable 1.7 cm left periaortic lymph node (image 59; series 2). Slight interval decrease in size of 1.3 cm aortocaval lymph node (image 61; series 2), previously 1.6 cm. Interval increase in size of 2.2 cm right external iliac lymph node (image 95; series 2), previously 1.6 cm. Similar-appearing 1.0 cm right common iliac lymph node (image 81; series 2). Reproductive: Intrauterine device is present. Small  amount of free fluid in the pelvis. Other: None. Musculoskeletal: Lumbar spine degenerative changes. No aggressive or acute appearing osseous lesions. Spinal stimulator device projects over the left flank. IMPRESSION: 1. Mixed response of lymph nodes throughout the chest, abdomen and pelvis. Interval increase in size of precarinal lymph node and right external iliac lymph node. Additional lymph nodes are either stable or decreased in size when compared to prior. 2. 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 3. New small amount of fluid in the pelvis. Electronically Signed   By: DLovey NewcomerM.D.   On: 11/14/2017 09:26   Ct Abdomen Pelvis W Contrast  Result Date: 11/14/2017 CLINICAL DATA:  Patient with history of rectal carcinoma diagnosed in 2014. Metastatic to the lymph nodes. EXAM: CT CHEST, ABDOMEN, AND PELVIS WITH CONTRAST TECHNIQUE: Multidetector CT imaging of the chest, abdomen and pelvis was performed following the standard protocol during bolus administration of intravenous contrast. CONTRAST:  1072mISOVUE-300 IOPAMIDOL (ISOVUE-300) INJECTION 61% COMPARISON:  Chest CT 07/13/2017; abdomen pelvis CT 07/05/2017. FINDINGS: CT CHEST FINDINGS Cardiovascular: Right anterior chest wall Port-A-Cath is present with tip terminating in the superior vena cava. Normal heart size. No pericardial effusion. Mediastinum/Nodes: Mild interval increase in size of precarinal lymph node measuring 1.3 cm (image 19; series 2), previously 1.1 cm. Similar right paratracheal lymph node measuring 0.7 cm (image 15; series 2), previously 0.9 cm. Similar 1.2 cm left supraclavicular lymph node (image 3; series 2), previously 1.2 cm. Stable appearance of the esophagus. No axillary adenopathy. Lungs/Pleura:  Central airways are patent. Dependent atelectasis within the bilateral lower lobes. Musculoskeletal: Thoracic spine degenerative changes. No aggressive  or acute appearing osseous lesions. CT ABDOMEN PELVIS FINDINGS Hepatobiliary: Liver is normal in size and contour. Stable subcentimeter low-attenuation lesion within the hepatic dome (image 43; series 2). Gallbladder is unremarkable. No intrahepatic or extrahepatic biliary ductal dilatation. Pancreas: Unremarkable Spleen: Unremarkable Adrenals/Urinary Tract: Adrenal glands are normal. Left kidney is surgically absent. Right kidney is normal in appearance. No hydronephrosis. Urinary bladder is decompressed. Stomach/Bowel: Stable presacral soft tissue thickening with small amount of associated gas. Postsurgical changes compatible with low anterior resection and colorectal anastomosis. Oral contrast material to the level of the rectum. Normal morphology of the stomach. No evidence for small bowel obstruction. No free fluid or free intraperitoneal air. Vascular/Lymphatic: Stable 1.7 cm left periaortic lymph node (image 59; series 2). Slight interval decrease in size of 1.3 cm aortocaval lymph node (image 61; series 2), previously 1.6 cm. Interval increase in size of 2.2 cm right external iliac lymph node (image 95; series 2), previously 1.6 cm. Similar-appearing 1.0 cm right common iliac lymph node (image 81; series 2). Reproductive: Intrauterine device is present. Small amount of free fluid in the pelvis. Other: None. Musculoskeletal: Lumbar spine degenerative changes. No aggressive or acute appearing osseous lesions. Spinal stimulator device projects over the left flank. IMPRESSION: 1. Mixed response of lymph nodes throughout the chest, abdomen and pelvis. Interval increase in size of precarinal lymph node and right external iliac lymph node. Additional lymph nodes are either stable or decreased in size when compared to prior. 2. 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 3. New small amount of fluid in the pelvis. Electronically Signed    By: Lovey Newcomer M.D.   On: 11/14/2017 09:26    Medications: I have reviewed the patient's current medications.   Assessment/Plan: 1. Rectal cancer, clinical stage III (T3 N1) status post biopsy of a rectal mass on 05/24/2013 confirming adenocarcinoma. Endoscopic ultrasound 05/31/2013 measured the mass at 11 cm from the anal verge, uT3uN1.  Initiation of radiation and concurrent Xeloda 06/18/2013; completion 07/26/2013.   Status post low anterior resection 09/02/2013. Invasive adenocarcinoma, 3.7 cm, negative margins; metastatic carcinoma in 7 of 11 lymph nodes; extensive lymph vascular involvement by tumor.MSI-stable, no loss of mismatch repair protein expression  Cycle 1 adjuvant FOLFOX 10/15/2013.   Cycle 2 adjuvant FOLFOX 10/29/2013.   Cycle 3 adjuvant FOLFOX 11/14/2013.   Cycle 4 adjuvant FOLFOX 11/27/2013.   Cycle 5 adjuvant FOLFOX 12/10/2013   Cycle 6 adjuvant FOLFOX 12/24/2013.   Cycle 7 adjuvant FOLFOX 01/14/2014.   Cycle 8 adjuvant FOLFOX 01/28/2014   Cycle 9 adjuvant FOLFOX 02/11/2014 (oxaliplatin dose reduced secondary to thrombocytopenia)   Surveillance CT scans 06/02/2014 with no evidence of recurrent rectal cancer  surveillance CT scans 06/16/2015 with no evidence of recurrent rectal cancer, 10 mm right external iliac node , increased amount of loculated air associated with the presacral soft tissue thickening , new left hydronephrosis  Surveillance CT scans 05/06/2016-negative for recurrent rectal cancer, decreased right external iliac node, persistent left hydronephrosis  CT abdomen/pelvis 07/05/2017-new left para-aortic, aortocaval, right common iliac and right external iliac lymphadenopathy.  CT neck/chest 07/13/2017-enlarged left supraclavicular nodes, new paratracheal nodes  Ultrasound-guided biopsy of a left supraclavicular lymph node 07/25/2017-metastatic adenocarcinoma consistent with colorectal cancer,MSI-stable, tumor mutation burden-10,  K-ras S39mtation  Cycle 1 FOLFIRI 08/28/2017  Cycle 2 FOLFIRI 09/11/2017  Cycle 3 FOLFIRI 09/25/2017  Cycle 4 FOLFIRI 10/09/2017  Cycle 5 FOLFIRI 10/23/2017  CTs 11/14/2017-slight decrease in left supraclavicular adenopathy, slight increase and decrease in mediastinal/retroperitoneal/pelvic nodes, no new disease  Cycle 6 FOLFIRI 11/14/2017 2. Post colonoscopy bowel perforation confirmed on abdomen CT 05/28/2013. 3. Single indeterminate liver lesion on the abdomen CT 05/28/2013. MRI on 06/18/2013 confirmed 2 tiny liver lesions, indeterminate-favored to be benign cysts. 4. History of cervical and lumbar disc disease.  5. History of HELLP syndrome with first pregnancy.  6. Delayed nausea following cycle 1 FOLFOX. She receives Aloxi and Emend. She continues to experience delayed nausea. Prophylactic Decadron added with cycle 4. 7. History of pain with bowel movements. 8. Anastomotic leak on CT 09/13/2013. Status post exploratory laparotomy with drainage of a pelvic abscess, diverting loop ileostomy. Drainage catheter removed 10/23/2013.  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 Perineal numbness, urinary retention. The urinary retention has improved. 10. Perineal pain-improved. 11. Neutropenia secondary chemotherapy-she received Neulasta. 12. Urinary tract infection, Escherichia coli-11/03/2013. 13. Venous engorgement at the left anterior chest and left arm with swelling of the left arm and hand. Venous Doppler 11/14/2013 positive for DVT. She began Lovenox 11/14/2013 and was switched to xarelto 12/10/2013. Xarelto discontinued early January 2016. 14. Fever 11/20/2013. Blood and urine cultures negative. The fever resolved within 24 hours. She continued to have a single episode of fever following chemotherapy 15. History of a herpetic lip lesion 16. History of thrombocytopenia related  to chemotherapy. 17. Escherichia coli bacteremia, urinary source, 01/14/2014. She completed a 10 day course of ceftriaxone. 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 Anxiety/depression. 20. Port-A-Cath removal 06/09/2014. 21. Escherichia coli urinary tract infection 06/24/2014. She completed a course of ciprofloxacin. 22. frequent bowel movements following surgery/radiation -status post placement of a sacral stimulator 11/21/2014 23. CT 06/16/2015 with new left hydronephrosis status post cystoscopy with placement of a double-J stent 07/17/2015. No tumors or stones were seen in the bladder. Both ureteral orifices were unremarkable. On the left side retrograde study was performed. The distal ureter was narrowed and then a little bit more dilated proximally with hydronephrosis noted. There was no evidence of intramural tumor but the area did seem a bit strictured.  Stent removed, persistent hydronephrosis, followed by Dr. Amalia Hailey  CT 05/06/2016-increased left hydronephrosis  Ultrasound 09/09/2016-stable left hydroureteronephrosis  Left nephrectomy 04/28/2017  24.CT abdomen/pelvis 07/05/2017-new left para-aortic, aortocaval, right common iliac and right external iliac lymphadenopathy.   Disposition: Ms. Gutierrez appears unchanged.  The restaging CTs reveal overall stable disease.  I reviewed the CT images from 11/14/2017.  There is no clinical evidence of disease progression.  I discussed treatment options with Ms. Dereck Leep and her husband.  We decided to continue FOLFIRI chemotherapy.  The plan is to complete 5 more cycles of FOLFIRI prior to a restaging CT evaluation.  She will complete another cycle of FOLFIRI today.  She will return for an office visit and chemotherapy in 2 weeks.  25 minutes were spent with the patient today.  The majority of the time was used for counseling and coordination of care.  Betsy Coder, MD  11/14/2017  10:50  AM

## 2017-11-16 ENCOUNTER — Inpatient Hospital Stay: Payer: 59

## 2017-11-16 VITALS — BP 108/84 | HR 92 | Temp 98.4°F | Resp 20

## 2017-11-16 DIAGNOSIS — Z5111 Encounter for antineoplastic chemotherapy: Secondary | ICD-10-CM | POA: Diagnosis not present

## 2017-11-16 DIAGNOSIS — C20 Malignant neoplasm of rectum: Secondary | ICD-10-CM

## 2017-11-16 MED ORDER — HEPARIN SOD (PORK) LOCK FLUSH 100 UNIT/ML IV SOLN
500.0000 [IU] | Freq: Once | INTRAVENOUS | Status: AC | PRN
  Administered 2017-11-16: 500 [IU]
  Filled 2017-11-16: qty 5

## 2017-11-16 MED ORDER — SODIUM CHLORIDE 0.9% FLUSH
10.0000 mL | INTRAVENOUS | Status: DC | PRN
  Administered 2017-11-16: 10 mL
  Filled 2017-11-16: qty 10

## 2017-11-16 NOTE — Patient Instructions (Signed)
Implanted Port Home Guide An implanted port is a type of central line that is placed under the skin. Central lines are used to provide IV access when treatment or nutrition needs to be given through a person's veins. Implanted ports are used for long-term IV access. An implanted port may be placed because:  You need IV medicine that would be irritating to the small veins in your hands or arms.  You need long-term IV medicines, such as antibiotics.  You need IV nutrition for a long period.  You need frequent blood draws for lab tests.  You need dialysis.  Implanted ports are usually placed in the chest area, but they can also be placed in the upper arm, the abdomen, or the leg. An implanted port has two main parts:  Reservoir. The reservoir is round and will appear as a small, raised area under your skin. The reservoir is the part where a needle is inserted to give medicines or draw blood.  Catheter. The catheter is a thin, flexible tube that extends from the reservoir. The catheter is placed into a large vein. Medicine that is inserted into the reservoir goes into the catheter and then into the vein.  How will I care for my incision site? Do not get the incision site wet. Bathe or shower as directed by your health care provider. How is my port accessed? Special steps must be taken to access the port:  Before the port is accessed, a numbing cream can be placed on the skin. This helps numb the skin over the port site.  Your health care provider uses a sterile technique to access the port. ? Your health care provider must put on a mask and sterile gloves. ? The skin over your port is cleaned carefully with an antiseptic and allowed to dry. ? The port is gently pinched between sterile gloves, and a needle is inserted into the port.  Only "non-coring" port needles should be used to access the port. Once the port is accessed, a blood return should be checked. This helps ensure that the port  is in the vein and is not clogged.  If your port needs to remain accessed for a constant infusion, a clear (transparent) bandage will be placed over the needle site. The bandage and needle will need to be changed every week, or as directed by your health care provider.  Keep the bandage covering the needle clean and dry. Do not get it wet. Follow your health care provider's instructions on how to take a shower or bath while the port is accessed.  If your port does not need to stay accessed, no bandage is needed over the port.  What is flushing? Flushing helps keep the port from getting clogged. Follow your health care provider's instructions on how and when to flush the port. Ports are usually flushed with saline solution or a medicine called heparin. The need for flushing will depend on how the port is used.  If the port is used for intermittent medicines or blood draws, the port will need to be flushed: ? After medicines have been given. ? After blood has been drawn. ? As part of routine maintenance.  If a constant infusion is running, the port may not need to be flushed.  How long will my port stay implanted? The port can stay in for as long as your health care provider thinks it is needed. When it is time for the port to come out, surgery will be   done to remove it. The procedure is similar to the one performed when the port was put in. When should I seek immediate medical care? When you have an implanted port, you should seek immediate medical care if:  You notice a bad smell coming from the incision site.  You have swelling, redness, or drainage at the incision site.  You have more swelling or pain at the port site or the surrounding area.  You have a fever that is not controlled with medicine.  This information is not intended to replace advice given to you by your health care provider. Make sure you discuss any questions you have with your health care provider. Document  Released: 06/06/2005 Document Revised: 11/12/2015 Document Reviewed: 02/11/2013 Elsevier Interactive Patient Education  2017 [employer]  

## 2017-11-20 ENCOUNTER — Other Ambulatory Visit: Payer: Managed Care, Other (non HMO)

## 2017-11-20 ENCOUNTER — Ambulatory Visit: Payer: Managed Care, Other (non HMO) | Admitting: Oncology

## 2017-11-20 ENCOUNTER — Other Ambulatory Visit: Payer: 59

## 2017-11-26 ENCOUNTER — Other Ambulatory Visit: Payer: Self-pay | Admitting: Oncology

## 2017-11-27 ENCOUNTER — Inpatient Hospital Stay: Payer: 59

## 2017-11-27 ENCOUNTER — Telehealth: Payer: Self-pay | Admitting: Oncology

## 2017-11-27 ENCOUNTER — Inpatient Hospital Stay: Payer: 59 | Attending: Nurse Practitioner

## 2017-11-27 ENCOUNTER — Inpatient Hospital Stay (HOSPITAL_BASED_OUTPATIENT_CLINIC_OR_DEPARTMENT_OTHER): Payer: 59 | Admitting: Oncology

## 2017-11-27 ENCOUNTER — Encounter: Payer: Self-pay | Admitting: Oncology

## 2017-11-27 VITALS — BP 132/81 | HR 67 | Temp 98.5°F | Resp 18 | Ht 64.0 in | Wt 155.1 lb

## 2017-11-27 DIAGNOSIS — N390 Urinary tract infection, site not specified: Secondary | ICD-10-CM

## 2017-11-27 DIAGNOSIS — C20 Malignant neoplasm of rectum: Secondary | ICD-10-CM

## 2017-11-27 DIAGNOSIS — Z452 Encounter for adjustment and management of vascular access device: Secondary | ICD-10-CM | POA: Insufficient documentation

## 2017-11-27 DIAGNOSIS — Z95828 Presence of other vascular implants and grafts: Secondary | ICD-10-CM

## 2017-11-27 DIAGNOSIS — C770 Secondary and unspecified malignant neoplasm of lymph nodes of head, face and neck: Secondary | ICD-10-CM | POA: Insufficient documentation

## 2017-11-27 DIAGNOSIS — Z5111 Encounter for antineoplastic chemotherapy: Secondary | ICD-10-CM | POA: Diagnosis not present

## 2017-11-27 DIAGNOSIS — R319 Hematuria, unspecified: Principal | ICD-10-CM

## 2017-11-27 LAB — CBC WITH DIFFERENTIAL (CANCER CENTER ONLY)
BASOS ABS: 0 10*3/uL (ref 0.0–0.1)
Basophils Relative: 1 %
EOS PCT: 6 %
Eosinophils Absolute: 0.2 10*3/uL (ref 0.0–0.5)
HEMATOCRIT: 35.5 % (ref 34.8–46.6)
HEMOGLOBIN: 12.2 g/dL (ref 11.6–15.9)
LYMPHS ABS: 0.4 10*3/uL — AB (ref 0.9–3.3)
LYMPHS PCT: 15 %
MCH: 32.5 pg (ref 25.1–34.0)
MCHC: 34.3 g/dL (ref 31.5–36.0)
MCV: 94.8 fL (ref 79.5–101.0)
Monocytes Absolute: 0.2 10*3/uL (ref 0.1–0.9)
Monocytes Relative: 9 %
NEUTROS ABS: 2 10*3/uL (ref 1.5–6.5)
Neutrophils Relative %: 69 %
PLATELETS: 234 10*3/uL (ref 145–400)
RBC: 3.74 MIL/uL (ref 3.70–5.45)
RDW: 17.5 % — ABNORMAL HIGH (ref 11.2–14.5)
WBC: 2.9 10*3/uL — AB (ref 3.9–10.3)

## 2017-11-27 LAB — CMP (CANCER CENTER ONLY)
ALT: 10 U/L (ref 0–55)
ANION GAP: 8 (ref 3–11)
AST: 14 U/L (ref 5–34)
Albumin: 3.8 g/dL (ref 3.5–5.0)
Alkaline Phosphatase: 69 U/L (ref 40–150)
BUN: 18 mg/dL (ref 7–26)
CHLORIDE: 108 mmol/L (ref 98–109)
CO2: 23 mmol/L (ref 22–29)
Calcium: 9.4 mg/dL (ref 8.4–10.4)
Creatinine: 1.13 mg/dL — ABNORMAL HIGH (ref 0.60–1.10)
GFR, EST NON AFRICAN AMERICAN: 54 mL/min — AB (ref >60)
Glucose, Bld: 92 mg/dL (ref 70–140)
Potassium: 4.2 mmol/L (ref 3.5–5.1)
SODIUM: 139 mmol/L (ref 136–145)
Total Bilirubin: 0.4 mg/dL (ref 0.2–1.2)
Total Protein: 6.9 g/dL (ref 6.4–8.3)

## 2017-11-27 LAB — CEA (IN HOUSE-CHCC): CEA (CHCC-IN HOUSE): 5.83 ng/mL — AB (ref 0.00–5.00)

## 2017-11-27 MED ORDER — FLUOROURACIL CHEMO INJECTION 2.5 GM/50ML
400.0000 mg/m2 | Freq: Once | INTRAVENOUS | Status: AC
  Administered 2017-11-27: 700 mg via INTRAVENOUS
  Filled 2017-11-27: qty 14

## 2017-11-27 MED ORDER — SODIUM CHLORIDE 0.9 % IV SOLN
Freq: Once | INTRAVENOUS | Status: AC
  Administered 2017-11-27: 13:00:00 via INTRAVENOUS
  Filled 2017-11-27: qty 5

## 2017-11-27 MED ORDER — SODIUM CHLORIDE 0.9 % IV SOLN
2400.0000 mg/m2 | INTRAVENOUS | Status: DC
  Administered 2017-11-27: 4250 mg via INTRAVENOUS
  Filled 2017-11-27: qty 85

## 2017-11-27 MED ORDER — SODIUM CHLORIDE 0.9 % IV SOLN
Freq: Once | INTRAVENOUS | Status: AC
  Administered 2017-11-27: 12:00:00 via INTRAVENOUS

## 2017-11-27 MED ORDER — PALONOSETRON HCL INJECTION 0.25 MG/5ML
INTRAVENOUS | Status: AC
  Filled 2017-11-27: qty 5

## 2017-11-27 MED ORDER — ATROPINE SULFATE 1 MG/ML IJ SOLN
INTRAMUSCULAR | Status: AC
  Filled 2017-11-27: qty 1

## 2017-11-27 MED ORDER — IRINOTECAN HCL CHEMO INJECTION 100 MG/5ML
180.0000 mg/m2 | Freq: Once | INTRAVENOUS | Status: AC
  Administered 2017-11-27: 320 mg via INTRAVENOUS
  Filled 2017-11-27: qty 16

## 2017-11-27 MED ORDER — ATROPINE SULFATE 1 MG/ML IJ SOLN
0.5000 mg | Freq: Once | INTRAMUSCULAR | Status: AC | PRN
  Administered 2017-11-27: 0.5 mg via INTRAVENOUS

## 2017-11-27 MED ORDER — SODIUM CHLORIDE 0.9% FLUSH
10.0000 mL | INTRAVENOUS | Status: DC | PRN
Start: 2017-11-27 — End: 2017-11-27
  Administered 2017-11-27: 10 mL
  Filled 2017-11-27: qty 10

## 2017-11-27 MED ORDER — LEUCOVORIN CALCIUM INJECTION 350 MG
400.0000 mg/m2 | Freq: Once | INTRAVENOUS | Status: AC
  Administered 2017-11-27: 712 mg via INTRAVENOUS
  Filled 2017-11-27: qty 35.6

## 2017-11-27 MED ORDER — PALONOSETRON HCL INJECTION 0.25 MG/5ML
0.2500 mg | Freq: Once | INTRAVENOUS | Status: AC
  Administered 2017-11-27: 0.25 mg via INTRAVENOUS

## 2017-11-27 NOTE — Telephone Encounter (Signed)
Appointments scheduled AVS/Calendar printed per 6/10 los °

## 2017-11-27 NOTE — Patient Instructions (Signed)
Cancer Center Discharge Instructions for Patients Receiving Chemotherapy  Today you received the following chemotherapy agents: Irinotecan, Leucovorin, 5FU  To help prevent nausea and vomiting after your treatment, we encourage you to take your nausea medication as directed.   If you develop nausea and vomiting that is not controlled by your nausea medication, call the clinic.   BELOW ARE SYMPTOMS THAT SHOULD BE REPORTED IMMEDIATELY:  *FEVER GREATER THAN 100.5 F  *CHILLS WITH OR WITHOUT FEVER  NAUSEA AND VOMITING THAT IS NOT CONTROLLED WITH YOUR NAUSEA MEDICATION  *UNUSUAL SHORTNESS OF BREATH  *UNUSUAL BRUISING OR BLEEDING  TENDERNESS IN MOUTH AND THROAT WITH OR WITHOUT PRESENCE OF ULCERS  *URINARY PROBLEMS  *BOWEL PROBLEMS  UNUSUAL RASH Items with * indicate a potential emergency and should be followed up as soon as possible.  Feel free to call the clinic should you have any questions or concerns. The clinic phone number is [phone].  Please show the CHEMO ALERT CARD at check-in to the Emergency Department and triage nurse.   

## 2017-11-27 NOTE — Progress Notes (Signed)
[facility] OFFICE PROGRESS NOTE   Diagnosis: Rectal cancer  INTERVAL HISTORY:   [name] returns for a scheduled visit.  She completed another cycle of FOLFIRI beginning 11/14/2017.  She reports malaise and nausea following chemotherapy.  She has irregular bowel habits, but no consistent diarrhea.  She had discomfort in the low abdomen yesterday.  Objective:  Vital signs in last 24 hours:  Blood pressure 132/81, pulse 67, temperature 98.5 F (36.9 C), temperature source Oral, resp. rate 18, height 5' 4"  (1.626 m), weight 155 lb 1.6 oz (70.4 kg), SpO2 100 %.    HEENT: No thrush or ulcers Resp: Lungs clear bilaterally Cardio: Regular rate and rhythm GI: No hepatosplenomegaly, no mass, nontender Vascular: No leg edema  Skin: Mild confluent erythema at the upper chest and back  Portacath/PICC-without erythema  Lab Results:  Lab Results  Component Value Date   WBC 2.9 (L) 11/27/2017   HGB 12.2 11/27/2017   HCT 35.5 11/27/2017   MCV 94.8 11/27/2017   PLT 234 11/27/2017   NEUTROABS 2.0 11/27/2017    CMP  Lab Results  Component Value Date   NA 138 11/14/2017   K 4.0 11/14/2017   CL 106 11/14/2017   CO2 23 11/14/2017   GLUCOSE 93 11/14/2017   BUN 18 11/14/2017   CREATININE 1.02 (H) 11/14/2017   CALCIUM 9.0 11/14/2017   PROT 7.0 11/14/2017   ALBUMIN 3.9 11/14/2017   AST 18 11/14/2017   ALT 16 11/14/2017   ALKPHOS 56 11/14/2017   BILITOT 0.5 11/14/2017   GFRNONAA >60 11/14/2017   GFRAA >60 11/14/2017    Lab Results  Component Value Date   CEA1 6.50 (H) 11/14/2017     Medications: I have reviewed the patient's current medications.   Assessment/Plan: 1. Rectal cancer, clinical stage III (T3 N1) status post biopsy of a rectal mass on 05/24/2013 confirming adenocarcinoma. Endoscopic ultrasound 05/31/2013 measured the mass at 11 cm from the anal verge, uT3uN1.  Initiation of radiation and concurrent Xeloda 06/18/2013; completion 07/26/2013.    Status post low anterior resection 09/02/2013. Invasive adenocarcinoma, 3.7 cm, negative margins; metastatic carcinoma in 7 of 11 lymph nodes; extensive lymph vascular involvement by tumor.MSI-stable, no loss of mismatch repair protein expression  Cycle 1 adjuvant FOLFOX 10/15/2013.   Cycle 2 adjuvant FOLFOX 10/29/2013.   Cycle 3 adjuvant FOLFOX 11/14/2013.   Cycle 4 adjuvant FOLFOX 11/27/2013.   Cycle 5 adjuvant FOLFOX 12/10/2013   Cycle 6 adjuvant FOLFOX 12/24/2013.   Cycle 7 adjuvant FOLFOX 01/14/2014.   Cycle 8 adjuvant FOLFOX 01/28/2014   Cycle 9 adjuvant FOLFOX 02/11/2014 (oxaliplatin dose reduced secondary to thrombocytopenia)   Surveillance CT scans 06/02/2014 with no evidence of recurrent rectal cancer  surveillance CT scans 06/16/2015 with no evidence of recurrent rectal cancer, 10 mm right external iliac node , increased amount of loculated air associated with the presacral soft tissue thickening , new left hydronephrosis  Surveillance CT scans 05/06/2016-negative for recurrent rectal cancer, decreased right external iliac node, persistent left hydronephrosis  CT abdomen/pelvis 07/05/2017-new left para-aortic, aortocaval, right common iliac and right external iliac lymphadenopathy.  CT neck/chest 07/13/2017-enlarged left supraclavicular nodes, new paratracheal nodes  Ultrasound-guided biopsy of a left supraclavicular lymph node 07/25/2017-metastatic adenocarcinoma consistent with colorectal cancer,MSI-stable, tumor mutation burden-10, K-ras S43mtation  Cycle 1 FOLFIRI 08/28/2017  Cycle 2 FOLFIRI 09/11/2017  Cycle 3 FOLFIRI 09/25/2017  Cycle 4 FOLFIRI 10/09/2017  Cycle 5 FOLFIRI 10/23/2017  CTs 11/14/2017-slight decrease in left supraclavicular adenopathy, slight increase and decrease in  mediastinal/retroperitoneal/pelvic nodes, no new disease  Cycle 6 FOLFIRI 11/14/2017  Cycle 7 FOLFIRI 11/27/2017 2. Post colonoscopy bowel perforation confirmed on  abdomen CT 05/28/2013. 3. Single indeterminate liver lesion on the abdomen CT 05/28/2013. MRI on 06/18/2013 confirmed 2 tiny liver lesions, indeterminate-favored to be benign cysts. 4. History of cervical and lumbar disc disease.  5. History of HELLP syndrome with first pregnancy.  6. Delayed nausea following cycle 1 FOLFOX. She receives Aloxi and Emend. She continues to experience delayed nausea. Prophylactic Decadron added with cycle 4. 7. History of pain with bowel movements. 8. Anastomotic leak on CT 09/13/2013. Status post exploratory laparotomy with drainage of a pelvic abscess, diverting loop ileostomy. Drainage catheter removed 10/23/2013.  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 Perineal numbness, urinary retention. The urinary retention has improved. 10. Perineal pain-improved. 11. Neutropenia secondary chemotherapy-she received Neulasta. 12. Urinary tract infection, Escherichia coli-11/03/2013. 13. Venous engorgement at the left anterior chest and left arm with swelling of the left arm and hand. Venous Doppler 11/14/2013 positive for DVT. She began Lovenox 11/14/2013 and was switched to xarelto 12/10/2013. Xarelto discontinued early January 2016. 14. Fever 11/20/2013. Blood and urine cultures negative. The fever resolved within 24 hours. She continued to have a single episode of fever following chemotherapy 15. History of a herpetic lip lesion 16. 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 17. Escherichia coli bacteremia, urinary source, 01/14/2014. She completed a 10 day course of ceftriaxone. 10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 Anxiety/depression. 20. Port-A-Cath removal 06/09/2014. 21. Escherichia coli urinary tract infection 06/24/2014. She completed a course of ciprofloxacin. 22. frequent bowel movements  following surgery/radiation -status post placement of a sacral stimulator 11/21/2014 23. CT 06/16/2015 with new left hydronephrosis status post cystoscopy with placement of a double-J stent 07/17/2015. No tumors or stones were seen in the bladder. Both ureteral orifices were unremarkable. On the left side retrograde study was performed. The distal ureter was narrowed and then a little bit more dilated proximally with hydronephrosis noted. There was no evidence of intramural tumor but the area did seem a bit strictured.  Stent removed, persistent hydronephrosis, followed by Dr. Amalia Hailey  CT 05/06/2016-increased left hydronephrosis  Ultrasound 09/09/2016-stable left hydroureteronephrosis  Left nephrectomy 04/28/2017  24.CT abdomen/pelvis 07/05/2017-new left para-aortic, aortocaval, right common iliac and right external iliac lymphadenopathy.   Disposition: Ms. Rausch appears unchanged.  She will complete another cycle of FOLFIRI today.  She will take Decadron prophylaxis for delayed nausea.  She will return for an office visit and chemotherapy in 2 weeks.  15 minutes were spent with the patient today.  The majority of the time was used for counseling and coordination of care.  Renee Coder, MD  11/27/2017  11:06 AM

## 2017-11-29 ENCOUNTER — Inpatient Hospital Stay: Payer: 59

## 2017-11-29 VITALS — BP 132/78 | HR 78 | Temp 98.6°F | Resp 18

## 2017-11-29 DIAGNOSIS — Z5111 Encounter for antineoplastic chemotherapy: Secondary | ICD-10-CM | POA: Diagnosis not present

## 2017-11-29 DIAGNOSIS — C20 Malignant neoplasm of rectum: Secondary | ICD-10-CM

## 2017-11-29 MED ORDER — SODIUM CHLORIDE 0.9% FLUSH
10.0000 mL | INTRAVENOUS | Status: DC | PRN
  Administered 2017-11-29: 10 mL
  Filled 2017-11-29: qty 10

## 2017-11-29 MED ORDER — HEPARIN SOD (PORK) LOCK FLUSH 100 UNIT/ML IV SOLN
500.0000 [IU] | Freq: Once | INTRAVENOUS | Status: AC | PRN
  Administered 2017-11-29: 500 [IU]
  Filled 2017-11-29: qty 5

## 2017-12-09 ENCOUNTER — Other Ambulatory Visit: Payer: Self-pay | Admitting: Oncology

## 2017-12-11 ENCOUNTER — Inpatient Hospital Stay (HOSPITAL_BASED_OUTPATIENT_CLINIC_OR_DEPARTMENT_OTHER): Payer: 59 | Admitting: Oncology

## 2017-12-11 ENCOUNTER — Encounter: Payer: Self-pay | Admitting: Oncology

## 2017-12-11 ENCOUNTER — Inpatient Hospital Stay: Payer: 59

## 2017-12-11 ENCOUNTER — Telehealth: Payer: Self-pay | Admitting: Oncology

## 2017-12-11 VITALS — BP 129/94 | HR 88 | Temp 98.7°F | Resp 17 | Ht 64.0 in | Wt 152.0 lb

## 2017-12-11 DIAGNOSIS — Z5111 Encounter for antineoplastic chemotherapy: Secondary | ICD-10-CM | POA: Diagnosis not present

## 2017-12-11 DIAGNOSIS — C770 Secondary and unspecified malignant neoplasm of lymph nodes of head, face and neck: Secondary | ICD-10-CM

## 2017-12-11 DIAGNOSIS — C20 Malignant neoplasm of rectum: Secondary | ICD-10-CM

## 2017-12-11 LAB — CBC WITH DIFFERENTIAL (CANCER CENTER ONLY)
Basophils Absolute: 0 10*3/uL (ref 0.0–0.1)
Basophils Relative: 1 %
EOS ABS: 0.2 10*3/uL (ref 0.0–0.5)
EOS PCT: 4 %
HCT: 37.4 % (ref 34.8–46.6)
Hemoglobin: 12.7 g/dL (ref 11.6–15.9)
LYMPHS ABS: 0.5 10*3/uL — AB (ref 0.9–3.3)
LYMPHS PCT: 12 %
MCH: 32.6 pg (ref 25.1–34.0)
MCHC: 34.1 g/dL (ref 31.5–36.0)
MCV: 95.8 fL (ref 79.5–101.0)
MONO ABS: 0.4 10*3/uL (ref 0.1–0.9)
Monocytes Relative: 10 %
Neutro Abs: 2.8 10*3/uL (ref 1.5–6.5)
Neutrophils Relative %: 73 %
Platelet Count: 230 10*3/uL (ref 145–400)
RBC: 3.9 MIL/uL (ref 3.70–5.45)
RDW: 15.5 % — AB (ref 11.2–14.5)
WBC Count: 3.8 10*3/uL — ABNORMAL LOW (ref 3.9–10.3)

## 2017-12-11 LAB — CMP (CANCER CENTER ONLY)
ALT: 9 U/L (ref 0–55)
ANION GAP: 12 — AB (ref 3–11)
AST: 12 U/L (ref 5–34)
Albumin: 4 g/dL (ref 3.5–5.0)
Alkaline Phosphatase: 64 U/L (ref 40–150)
BUN: 20 mg/dL (ref 7–26)
CHLORIDE: 104 mmol/L (ref 98–109)
CO2: 25 mmol/L (ref 22–29)
Calcium: 9.8 mg/dL (ref 8.4–10.4)
Creatinine: 1.13 mg/dL — ABNORMAL HIGH (ref 0.60–1.10)
GFR, EST NON AFRICAN AMERICAN: 54 mL/min — AB (ref >60)
Glucose, Bld: 100 mg/dL (ref 70–140)
POTASSIUM: 3.2 mmol/L — AB (ref 3.5–5.1)
SODIUM: 141 mmol/L (ref 136–145)
Total Bilirubin: 0.4 mg/dL (ref 0.2–1.2)
Total Protein: 7.2 g/dL (ref 6.4–8.3)

## 2017-12-11 LAB — CEA (IN HOUSE-CHCC): CEA (CHCC-IN HOUSE): 5.81 ng/mL — AB (ref 0.00–5.00)

## 2017-12-11 MED ORDER — PALONOSETRON HCL INJECTION 0.25 MG/5ML
INTRAVENOUS | Status: AC
Start: 2017-12-11 — End: ?
  Filled 2017-12-11: qty 5

## 2017-12-11 MED ORDER — PALONOSETRON HCL INJECTION 0.25 MG/5ML
0.2500 mg | Freq: Once | INTRAVENOUS | Status: AC
  Administered 2017-12-11: 0.25 mg via INTRAVENOUS

## 2017-12-11 MED ORDER — ATROPINE SULFATE 1 MG/ML IJ SOLN
0.5000 mg | Freq: Once | INTRAMUSCULAR | Status: AC | PRN
  Administered 2017-12-11: 0.5 mg via INTRAVENOUS

## 2017-12-11 MED ORDER — FLUOROURACIL CHEMO INJECTION 2.5 GM/50ML
400.0000 mg/m2 | Freq: Once | INTRAVENOUS | Status: AC
  Administered 2017-12-11: 700 mg via INTRAVENOUS
  Filled 2017-12-11: qty 14

## 2017-12-11 MED ORDER — SODIUM CHLORIDE 0.9 % IV SOLN
2400.0000 mg/m2 | INTRAVENOUS | Status: DC
  Administered 2017-12-11: 4250 mg via INTRAVENOUS
  Filled 2017-12-11: qty 85

## 2017-12-11 MED ORDER — IRINOTECAN HCL CHEMO INJECTION 100 MG/5ML
180.0000 mg/m2 | Freq: Once | INTRAVENOUS | Status: AC
  Administered 2017-12-11: 320 mg via INTRAVENOUS
  Filled 2017-12-11: qty 16

## 2017-12-11 MED ORDER — SODIUM CHLORIDE 0.9% FLUSH
10.0000 mL | INTRAVENOUS | Status: DC | PRN
  Filled 2017-12-11: qty 10

## 2017-12-11 MED ORDER — POTASSIUM CHLORIDE ER 10 MEQ PO CPCR: 10.0000 meq | ORAL_CAPSULE | Freq: Two times a day (BID) | ORAL | 0 refills | Status: DC

## 2017-12-11 MED ORDER — LEUCOVORIN CALCIUM INJECTION 350 MG
400.0000 mg/m2 | Freq: Once | INTRAMUSCULAR | Status: AC
  Administered 2017-12-11: 712 mg via INTRAVENOUS
  Filled 2017-12-11: qty 35.6

## 2017-12-11 MED ORDER — ACYCLOVIR 400 MG PO TABS: 400.0000 mg | ORAL_TABLET | Freq: Two times a day (BID) | ORAL | 1 refills | Status: DC | PRN

## 2017-12-11 MED ORDER — FOSAPREPITANT DIMEGLUMINE INJECTION 150 MG
Freq: Once | INTRAVENOUS | Status: AC
  Administered 2017-12-11: 11:00:00 via INTRAVENOUS
  Filled 2017-12-11: qty 5

## 2017-12-11 MED ORDER — ATROPINE SULFATE 1 MG/ML IJ SOLN
INTRAMUSCULAR | Status: AC
  Filled 2017-12-11: qty 1

## 2017-12-11 MED ORDER — HEPARIN SOD (PORK) LOCK FLUSH 100 UNIT/ML IV SOLN
500.0000 [IU] | Freq: Once | INTRAVENOUS | Status: DC | PRN
  Filled 2017-12-11: qty 5

## 2017-12-11 NOTE — Progress Notes (Signed)
[place] OFFICE PROGRESS NOTE   Diagnosis: Rectal cancer  INTERVAL HISTORY:   [name] returns as scheduled.  She completed another cycle of FOLFIRI on 11/27/2017.  She reports not feeling well over the week following chemotherapy.  She took prophylactic Decadron and this did not help her symptoms.  No diarrhea.  The discomfort at the right lower abdomen has resolved.  Objective:  Vital signs in last 24 hours:  Blood pressure (!) 129/94, pulse 88, temperature 98.7 F (37.1 C), temperature source Oral, resp. rate 17, height 5' 4" (1.626 m), weight 152 lb (68.9 kg), SpO2 97 %.    HEENT: No thrush or ulcers Lymphatics: Firm fullness at the medial left lower neck Resp: Lungs clear bilaterally Cardio: Regular rate and rhythm GI: No hepatomegaly, no mass, nontender Vascular: No leg edema  Skin: Palms without erythema  Portacath/PICC-without erythema  Lab Results:  Lab Results  Component Value Date   WBC 3.8 (L) 12/11/2017   HGB 12.7 12/11/2017   HCT 37.4 12/11/2017   MCV 95.8 12/11/2017   PLT 230 12/11/2017   NEUTROABS 2.8 12/11/2017    CMP  Lab Results  Component Value Date   NA 141 12/11/2017   K 3.2 (L) 12/11/2017   CL 104 12/11/2017   CO2 25 12/11/2017   GLUCOSE 100 12/11/2017   BUN 20 12/11/2017   CREATININE 1.13 (H) 12/11/2017   CALCIUM 9.8 12/11/2017   PROT 7.2 12/11/2017   ALBUMIN 4.0 12/11/2017   AST 12 12/11/2017   ALT 9 12/11/2017   ALKPHOS 64 12/11/2017   BILITOT 0.4 12/11/2017   GFRNONAA 54 (L) 12/11/2017   GFRAA >60 12/11/2017    Lab Results  Component Value Date   CEA1 5.81 (H) 12/11/2017     Medications: I have reviewed the patient's current medications.   Assessment/Plan: 1. Rectal cancer, clinical stage III (T3 N1) status post biopsy of a rectal mass on 05/24/2013 confirming adenocarcinoma. Endoscopic ultrasound 05/31/2013 measured the mass at 11 cm from the anal verge, uT3uN1.  Initiation of radiation and  concurrent Xeloda 06/18/2013; completion 07/26/2013.   Status post low anterior resection 09/02/2013. Invasive adenocarcinoma, 3.7 cm, negative margins; metastatic carcinoma in 7 of 11 lymph nodes; extensive lymph vascular involvement by tumor.MSI-stable, no loss of mismatch repair protein expression  Cycle 1 adjuvant FOLFOX 10/15/2013.   Cycle 2 adjuvant FOLFOX 10/29/2013.   Cycle 3 adjuvant FOLFOX 11/14/2013.   Cycle 4 adjuvant FOLFOX 11/27/2013.   Cycle 5 adjuvant FOLFOX 12/10/2013   Cycle 6 adjuvant FOLFOX 12/24/2013.   Cycle 7 adjuvant FOLFOX 01/14/2014.   Cycle 8 adjuvant FOLFOX 01/28/2014   Cycle 9 adjuvant FOLFOX 02/11/2014 (oxaliplatin dose reduced secondary to thrombocytopenia)   Surveillance CT scans 06/02/2014 with no evidence of recurrent rectal cancer  surveillance CT scans 06/16/2015 with no evidence of recurrent rectal cancer, 10 mm right external iliac node , increased amount of loculated air associated with the presacral soft tissue thickening , new left hydronephrosis  Surveillance CT scans 05/06/2016-negative for recurrent rectal cancer, decreased right external iliac node, persistent left hydronephrosis  CT abdomen/pelvis 07/05/2017-new left para-aortic, aortocaval, right common iliac and right external iliac lymphadenopathy.  CT neck/chest 07/13/2017-enlarged left supraclavicular nodes, new paratracheal nodes  Ultrasound-guided biopsy of a left supraclavicular lymph node 07/25/2017-metastatic adenocarcinoma consistent with colorectal cancer,MSI-stable, tumor mutation burden-10, K-ras S42mtation  Cycle 1 FOLFIRI 08/28/2017  Cycle 2 FOLFIRI 09/11/2017  Cycle 3 FOLFIRI 09/25/2017  Cycle 4 FOLFIRI 10/09/2017  Cycle 5 FOLFIRI 10/23/2017  CTs  11/14/2017-slight decrease in left supraclavicular adenopathy, slight increase and decrease in mediastinal/retroperitoneal/pelvic nodes, no new disease  Cycle 6 FOLFIRI 11/14/2017  Cycle 7 FOLFIRI  11/27/2017  Cycle 8 FOLFIRI 12/11/2017 2. Post colonoscopy bowel perforation confirmed on abdomen CT 05/28/2013. 3. Single indeterminate liver lesion on the abdomen CT 05/28/2013. MRI on 06/18/2013 confirmed 2 tiny liver lesions, indeterminate-favored to be benign cysts. 4. History of cervical and lumbar disc disease.  5. History of HELLP syndrome with first pregnancy.  6. Delayed nausea following cycle 1 FOLFOX. She receives Aloxi and Emend. She continues to experience delayed nausea. Prophylactic Decadron added with cycle 4. 7. History of pain with bowel movements. 8. Anastomotic leak on CT 09/13/2013. Status post exploratory laparotomy with drainage of a pelvic abscess, diverting loop ileostomy. Drainage catheter removed 10/23/2013.  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 Perineal numbness, urinary retention. The urinary retention has improved. 10. Perineal pain-improved. 11. Neutropenia secondary chemotherapy-she received Neulasta. 12. Urinary tract infection, Escherichia coli-11/03/2013. 13. Venous engorgement at the left anterior chest and left arm with swelling of the left arm and hand. Venous Doppler 11/14/2013 positive for DVT. She began Lovenox 11/14/2013 and was switched to xarelto 12/10/2013. Xarelto discontinued early January 2016. 14. Fever 11/20/2013. Blood and urine cultures negative. The fever resolved within 24 hours. She continued to have a single episode of fever following chemotherapy 15. History of a herpetic lip lesion 16. 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 17. Escherichia coli bacteremia, urinary source, 01/14/2014. She completed a 10 day course of ceftriaxone. 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 Anxiety/depression. 20. Port-A-Cath removal 06/09/2014. 21. Escherichia coli urinary tract  infection 06/24/2014. She completed a course of ciprofloxacin. 22. frequent bowel movements following surgery/radiation -status post placement of a sacral stimulator 11/21/2014 23. CT 06/16/2015 with new left hydronephrosis status post cystoscopy with placement of a double-J stent 07/17/2015. No tumors or stones were seen in the bladder. Both ureteral orifices were unremarkable. On the left side retrograde study was performed. The distal ureter was narrowed and then a little bit more dilated proximally with hydronephrosis noted. There was no evidence of intramural tumor but the area did seem a bit strictured.  Stent removed, persistent hydronephrosis, followed by Dr. Evans  CT 05/06/2016-increased left hydronephrosis  Ultrasound 09/09/2016-stable left hydroureteronephrosis  Left nephrectomy 04/28/2017  24.CT abdomen/pelvis 07/05/2017-new left para-aortic, aortocaval, right common iliac and right external iliac lymphadenopathy.  Disposition: [name] appears unchanged.  She has completed 7 cycles of FOLFIRI.  The neck lymphadenopathy appears unchanged today.  She will complete cycle 8 today.  The plan is to refer her for a restaging CT evaluation after cycle 9 FOLFIRI.  She will return for an office visit and chemotherapy in 2 weeks.  She will begin a potassium supplement.  15 minutes were spent with the patient today.  The majority of the time was used for counseling and coordination of care.  Gary Sherrill, MD  12/11/2017  10:26 AM   

## 2017-12-11 NOTE — Telephone Encounter (Signed)
Per 6/24 los f/u as scheduled 7/8.

## 2017-12-11 NOTE — Addendum Note (Signed)
Addended by: Jethro Bolus A on: 12/11/2017 02:35 PM   Modules accepted: Orders

## 2017-12-13 ENCOUNTER — Inpatient Hospital Stay: Payer: 59

## 2017-12-13 VITALS — BP 104/66 | HR 85 | Temp 98.4°F | Resp 18

## 2017-12-13 DIAGNOSIS — C20 Malignant neoplasm of rectum: Secondary | ICD-10-CM

## 2017-12-13 DIAGNOSIS — Z5111 Encounter for antineoplastic chemotherapy: Secondary | ICD-10-CM | POA: Diagnosis not present

## 2017-12-13 MED ORDER — SODIUM CHLORIDE 0.9% FLUSH
10.0000 mL | INTRAVENOUS | Status: DC | PRN
Start: 2017-12-13 — End: 2017-12-13
  Administered 2017-12-13: 10 mL
  Filled 2017-12-13: qty 10

## 2017-12-13 MED ORDER — HEPARIN SOD (PORK) LOCK FLUSH 100 UNIT/ML IV SOLN
500.0000 [IU] | Freq: Once | INTRAVENOUS | Status: AC | PRN
  Administered 2017-12-13: 500 [IU]
  Filled 2017-12-13: qty 5

## 2017-12-23 ENCOUNTER — Other Ambulatory Visit: Payer: Self-pay | Admitting: Oncology

## 2017-12-25 ENCOUNTER — Inpatient Hospital Stay: Payer: 59 | Admitting: *Deleted

## 2017-12-25 ENCOUNTER — Other Ambulatory Visit: Payer: 59

## 2017-12-25 ENCOUNTER — Inpatient Hospital Stay: Payer: 59 | Admitting: Nurse Practitioner

## 2017-12-25 ENCOUNTER — Inpatient Hospital Stay: Payer: 59

## 2017-12-26 ENCOUNTER — Telehealth: Payer: Self-pay | Admitting: Oncology

## 2017-12-26 NOTE — Telephone Encounter (Signed)
Appointments scheduled per 7/8 sch msg and patient notified. Message sent to Ginette Otto to add F/U appt with Sherrill per 7/9 sch msg

## 2017-12-27 ENCOUNTER — Inpatient Hospital Stay: Payer: 59

## 2018-01-03 ENCOUNTER — Inpatient Hospital Stay: Payer: 59 | Attending: Nurse Practitioner

## 2018-01-03 ENCOUNTER — Inpatient Hospital Stay (HOSPITAL_BASED_OUTPATIENT_CLINIC_OR_DEPARTMENT_OTHER): Payer: 59 | Admitting: Oncology

## 2018-01-03 ENCOUNTER — Inpatient Hospital Stay: Payer: 59

## 2018-01-03 VITALS — BP 115/83 | HR 76 | Temp 98.5°F | Resp 16 | Wt 153.0 lb

## 2018-01-03 DIAGNOSIS — C20 Malignant neoplasm of rectum: Secondary | ICD-10-CM | POA: Diagnosis not present

## 2018-01-03 DIAGNOSIS — C770 Secondary and unspecified malignant neoplasm of lymph nodes of head, face and neck: Secondary | ICD-10-CM | POA: Insufficient documentation

## 2018-01-03 DIAGNOSIS — Z452 Encounter for adjustment and management of vascular access device: Secondary | ICD-10-CM | POA: Diagnosis not present

## 2018-01-03 DIAGNOSIS — R197 Diarrhea, unspecified: Secondary | ICD-10-CM | POA: Insufficient documentation

## 2018-01-03 DIAGNOSIS — Z5111 Encounter for antineoplastic chemotherapy: Secondary | ICD-10-CM | POA: Insufficient documentation

## 2018-01-03 DIAGNOSIS — R112 Nausea with vomiting, unspecified: Secondary | ICD-10-CM | POA: Insufficient documentation

## 2018-01-03 LAB — CMP (CANCER CENTER ONLY)
ALT: 12 U/L (ref 0–44)
AST: 13 U/L — AB (ref 15–41)
Albumin: 3.9 g/dL (ref 3.5–5.0)
Alkaline Phosphatase: 67 U/L (ref 38–126)
Anion gap: 9 (ref 5–15)
BILIRUBIN TOTAL: 0.3 mg/dL (ref 0.3–1.2)
BUN: 16 mg/dL (ref 6–20)
CHLORIDE: 108 mmol/L (ref 98–111)
CO2: 23 mmol/L (ref 22–32)
Calcium: 9.3 mg/dL (ref 8.9–10.3)
Creatinine: 1.08 mg/dL — ABNORMAL HIGH (ref 0.44–1.00)
GFR, EST NON AFRICAN AMERICAN: 57 mL/min — AB (ref >60)
Glucose, Bld: 116 mg/dL — ABNORMAL HIGH (ref 70–99)
POTASSIUM: 3.8 mmol/L (ref 3.5–5.1)
Sodium: 140 mmol/L (ref 135–145)
TOTAL PROTEIN: 7.1 g/dL (ref 6.5–8.1)

## 2018-01-03 LAB — CBC WITH DIFFERENTIAL (CANCER CENTER ONLY)
BASOS ABS: 0 10*3/uL (ref 0.0–0.1)
Basophils Relative: 1 %
Eosinophils Absolute: 0.2 10*3/uL (ref 0.0–0.5)
Eosinophils Relative: 6 %
HEMATOCRIT: 38.4 % (ref 34.8–46.6)
Hemoglobin: 13 g/dL (ref 11.6–15.9)
LYMPHS ABS: 0.5 10*3/uL — AB (ref 0.9–3.3)
LYMPHS PCT: 14 %
MCH: 32.9 pg (ref 25.1–34.0)
MCHC: 33.8 g/dL (ref 31.5–36.0)
MCV: 97.2 fL (ref 79.5–101.0)
Monocytes Absolute: 0.3 10*3/uL (ref 0.1–0.9)
Monocytes Relative: 10 %
NEUTROS ABS: 2.3 10*3/uL (ref 1.5–6.5)
NEUTROS PCT: 69 %
PLATELETS: 229 10*3/uL (ref 145–400)
RBC: 3.95 MIL/uL (ref 3.70–5.45)
RDW: 14.1 % (ref 11.2–14.5)
WBC: 3.4 10*3/uL — AB (ref 3.9–10.3)

## 2018-01-03 MED ORDER — IRINOTECAN HCL CHEMO INJECTION 100 MG/5ML
180.0000 mg/m2 | Freq: Once | INTRAVENOUS | Status: AC
  Administered 2018-01-03: 320 mg via INTRAVENOUS
  Filled 2018-01-03: qty 15

## 2018-01-03 MED ORDER — FOSAPREPITANT DIMEGLUMINE INJECTION 150 MG
Freq: Once | INTRAVENOUS | Status: AC
  Administered 2018-01-03: 10:00:00 via INTRAVENOUS
  Filled 2018-01-03: qty 5

## 2018-01-03 MED ORDER — PALONOSETRON HCL INJECTION 0.25 MG/5ML
0.2500 mg | Freq: Once | INTRAVENOUS | Status: AC
  Administered 2018-01-03: 0.25 mg via INTRAVENOUS

## 2018-01-03 MED ORDER — LEUCOVORIN CALCIUM INJECTION 350 MG
400.0000 mg/m2 | Freq: Once | INTRAVENOUS | Status: AC
  Administered 2018-01-03: 712 mg via INTRAVENOUS
  Filled 2018-01-03: qty 35.6

## 2018-01-03 MED ORDER — FLUOROURACIL CHEMO INJECTION 2.5 GM/50ML
400.0000 mg/m2 | Freq: Once | INTRAVENOUS | Status: AC
  Administered 2018-01-03: 700 mg via INTRAVENOUS
  Filled 2018-01-03: qty 14

## 2018-01-03 MED ORDER — FLUOROURACIL CHEMO INJECTION 5 GM/100ML
2400.0000 mg/m2 | INTRAVENOUS | Status: DC
  Administered 2018-01-03: 4250 mg via INTRAVENOUS
  Filled 2018-01-03: qty 85

## 2018-01-03 MED ORDER — PALONOSETRON HCL INJECTION 0.25 MG/5ML
INTRAVENOUS | Status: AC
  Filled 2018-01-03: qty 5

## 2018-01-03 MED ORDER — ATROPINE SULFATE 1 MG/ML IJ SOLN
INTRAMUSCULAR | Status: AC
Start: 2018-01-03 — End: ?
  Filled 2018-01-03: qty 1

## 2018-01-03 MED ORDER — SODIUM CHLORIDE 0.9 % IV SOLN
Freq: Once | INTRAVENOUS | Status: AC
  Administered 2018-01-03: 09:00:00 via INTRAVENOUS

## 2018-01-03 MED ORDER — ATROPINE SULFATE 1 MG/ML IJ SOLN
0.5000 mg | Freq: Once | INTRAMUSCULAR | Status: AC | PRN
  Administered 2018-01-03: 0.5 mg via INTRAVENOUS

## 2018-01-03 NOTE — Patient Instructions (Addendum)
[facility] Discharge Instructions for Patients Receiving Chemotherapy  Today you received the following chemotherapy agents: Irinotecan, Leucovorin, 5FU  To help prevent nausea and vomiting after your treatment, we encourage you to take your nausea medication as directed.   If you develop nausea and vomiting that is not controlled by your nausea medication, call the clinic.   BELOW ARE SYMPTOMS THAT SHOULD BE REPORTED IMMEDIATELY:  *FEVER GREATER THAN 100.5 F  *CHILLS WITH OR WITHOUT FEVER  NAUSEA AND VOMITING THAT IS NOT CONTROLLED WITH YOUR NAUSEA MEDICATION  *UNUSUAL SHORTNESS OF BREATH  *UNUSUAL BRUISING OR BLEEDING  TENDERNESS IN MOUTH AND THROAT WITH OR WITHOUT PRESENCE OF ULCERS  *URINARY PROBLEMS  *BOWEL PROBLEMS  UNUSUAL RASH Items with * indicate a potential emergency and should be followed up as soon as possible.  Feel free to call the clinic should you have any questions or concerns. The clinic phone number is [phone].  Please show the CHEMO ALERT CARD at check-in to the Emergency Department and triage nurse.   

## 2018-01-03 NOTE — Progress Notes (Signed)
[place] OFFICE PROGRESS NOTE   Diagnosis: Rectal cancer  INTERVAL HISTORY:   [name] completed another cycle of FOLFIRI on 12/11/2017.  She reports feeling better with the extra week off of chemotherapy.  She missed a scheduled office visit and chemotherapy last week secondary to a scheduling miscommunication.  No abdominal pain.  She continues to have irregular bowel habits.  Objective:  Vital signs in last 24 hours:  There were no vitals taken for this visit.    HEENT: No thrush or ulcers Lymphatics: No cervical or supraclavicular nodes Resp: Lungs clear bilaterally Cardio: Regular rate and rhythm GI: No hepatomegaly, nontender Vascular: No leg edema  Portacath/PICC-without erythema  Lab Results:  Lab Results  Component Value Date   WBC 3.4 (L) 01/03/2018   HGB 13.0 01/03/2018   HCT 38.4 01/03/2018   MCV 97.2 01/03/2018   PLT 229 01/03/2018   NEUTROABS 2.3 01/03/2018    CMP  Lab Results  Component Value Date   NA 140 01/03/2018   K 3.8 01/03/2018   CL 108 01/03/2018   CO2 23 01/03/2018   GLUCOSE 116 (H) 01/03/2018   BUN 16 01/03/2018   CREATININE 1.08 (H) 01/03/2018   CALCIUM 9.3 01/03/2018   PROT 7.1 01/03/2018   ALBUMIN 3.9 01/03/2018   AST 13 (L) 01/03/2018   ALT 12 01/03/2018   ALKPHOS 67 01/03/2018   BILITOT 0.3 01/03/2018   GFRNONAA 57 (L) 01/03/2018   GFRAA >60 01/03/2018    Lab Results  Component Value Date   CEA1 5.81 (H) 12/11/2017     Medications: I have reviewed the patient's current medications.   Assessment/Plan: 1. Rectal cancer, clinical stage III (T3 N1) status post biopsy of a rectal mass on 05/24/2013 confirming adenocarcinoma. Endoscopic ultrasound 05/31/2013 measured the mass at 11 cm from the anal verge, uT3uN1.  Initiation of radiation and concurrent Xeloda 06/18/2013; completion 07/26/2013.   Status post low anterior resection 09/02/2013. Invasive adenocarcinoma, 3.7 cm, negative margins;  metastatic carcinoma in 7 of 11 lymph nodes; extensive lymph vascular involvement by tumor.MSI-stable, no loss of mismatch repair protein expression  Cycle 1 adjuvant FOLFOX 10/15/2013.   Cycle 2 adjuvant FOLFOX 10/29/2013.   Cycle 3 adjuvant FOLFOX 11/14/2013.   Cycle 4 adjuvant FOLFOX 11/27/2013.   Cycle 5 adjuvant FOLFOX 12/10/2013   Cycle 6 adjuvant FOLFOX 12/24/2013.   Cycle 7 adjuvant FOLFOX 01/14/2014.   Cycle 8 adjuvant FOLFOX 01/28/2014   Cycle 9 adjuvant FOLFOX 02/11/2014 (oxaliplatin dose reduced secondary to thrombocytopenia)   Surveillance CT scans 06/02/2014 with no evidence of recurrent rectal cancer  surveillance CT scans 06/16/2015 with no evidence of recurrent rectal cancer, 10 mm right external iliac node , increased amount of loculated air associated with the presacral soft tissue thickening , new left hydronephrosis  Surveillance CT scans 05/06/2016-negative for recurrent rectal cancer, decreased right external iliac node, persistent left hydronephrosis  CT abdomen/pelvis 07/05/2017-new left para-aortic, aortocaval, right common iliac and right external iliac lymphadenopathy.  CT neck/chest 07/13/2017-enlarged left supraclavicular nodes, new paratracheal nodes  Ultrasound-guided biopsy of a left supraclavicular lymph node 07/25/2017-metastatic adenocarcinoma consistent with colorectal cancer,MSI-stable, tumor mutation burden-10, K-ras S36mtation  Cycle 1 FOLFIRI 08/28/2017  Cycle 2 FOLFIRI 09/11/2017  Cycle 3 FOLFIRI 09/25/2017  Cycle 4 FOLFIRI 10/09/2017  Cycle 5 FOLFIRI 10/23/2017  CTs 11/14/2017-slight decrease in left supraclavicular adenopathy, slight increase and decrease in mediastinal/retroperitoneal/pelvic nodes, no new disease  Cycle 6 FOLFIRI 11/14/2017  Cycle 7 FOLFIRI 11/27/2017  Cycle 8 FOLFIRI 12/11/2017  Cycle 9 FOLFIRI 01/03/2018 2. Post colonoscopy bowel perforation confirmed on abdomen CT 05/28/2013. 3. Single indeterminate  liver lesion on the abdomen CT 05/28/2013. MRI on 06/18/2013 confirmed 2 tiny liver lesions, indeterminate-favored to be benign cysts. 4. History of cervical and lumbar disc disease.  5. History of HELLP syndrome with first pregnancy.  6. Delayed nausea following cycle 1 FOLFOX. She receives Aloxi and Emend. She continues to experience delayed nausea. Prophylactic Decadron added with cycle 4. 7. History of pain with bowel movements. 8. Anastomotic leak on CT 09/13/2013. Status post exploratory laparotomy with drainage of a pelvic abscess, diverting loop ileostomy. Drainage catheter removed 10/23/2013.  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 Perineal numbness, urinary retention. The urinary retention has improved. 10. Perineal pain-improved. 11. Neutropenia secondary chemotherapy-she received Neulasta. 12. Urinary tract infection, Escherichia coli-11/03/2013. 13. Venous engorgement at the left anterior chest and left arm with swelling of the left arm and hand. Venous Doppler 11/14/2013 positive for DVT. She began Lovenox 11/14/2013 and was switched to xarelto 12/10/2013. Xarelto discontinued early January 2016. 14. Fever 11/20/2013. Blood and urine cultures negative. The fever resolved within 24 hours. She continued to have a single episode of fever following chemotherapy 15. History of a herpetic lip lesion 16. 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 17. Escherichia coli bacteremia, urinary source, 01/14/2014. She completed a 10 day course of ceftriaxone. 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 Anxiety/depression. 20. Port-A-Cath removal 06/09/2014. 21. Escherichia coli urinary tract infection 06/24/2014. She completed a course of ciprofloxacin. 22. frequent bowel movements following surgery/radiation -status post placement of  a sacral stimulator 11/21/2014 23. CT 06/16/2015 with new left hydronephrosis status post cystoscopy with placement of a double-J stent 07/17/2015. No tumors or stones were seen in the bladder. Both ureteral orifices were unremarkable. On the left side retrograde study was performed. The distal ureter was narrowed and then a little bit more dilated proximally with hydronephrosis noted. There was no evidence of intramural tumor but the area did seem a bit strictured.  Stent removed, persistent hydronephrosis, followed by Dr. Amalia Hailey  CT 05/06/2016-increased left hydronephrosis  Ultrasound 09/09/2016-stable left hydroureteronephrosis  Left nephrectomy 04/28/2017  24.CT abdomen/pelvis 07/05/2017-new left para-aortic, aortocaval, right common iliac and right external iliac lymphadenopathy.     Disposition: She appears unchanged.  She will complete another cycle of FOLFIRI today.  [name] will return for an office visit and cycle 10 FOLFIRI in 2 weeks.  She will undergo a restaging CT evaluation after cycle 10.  15 minutes were spent with the patient today.  The majority of the time was used for counseling and coordination of care.  Betsy Coder, MD  01/03/2018  10:00 AM

## 2018-01-04 ENCOUNTER — Telehealth: Payer: Self-pay | Admitting: Oncology

## 2018-01-04 NOTE — Telephone Encounter (Signed)
Scheduled appt per 7/17 los- pt is aware of appt - unable to schedule on 7/31 due to cap - pt aware of gap in between MD and Chemo .

## 2018-01-05 ENCOUNTER — Other Ambulatory Visit: Payer: Self-pay | Admitting: Medical

## 2018-01-05 ENCOUNTER — Inpatient Hospital Stay (HOSPITAL_BASED_OUTPATIENT_CLINIC_OR_DEPARTMENT_OTHER): Payer: 59 | Admitting: Medical

## 2018-01-05 ENCOUNTER — Other Ambulatory Visit: Payer: Self-pay

## 2018-01-05 ENCOUNTER — Inpatient Hospital Stay: Payer: 59

## 2018-01-05 VITALS — BP 102/78 | HR 82 | Temp 98.6°F | Resp 18

## 2018-01-05 DIAGNOSIS — R110 Nausea: Secondary | ICD-10-CM

## 2018-01-05 DIAGNOSIS — Z5111 Encounter for antineoplastic chemotherapy: Secondary | ICD-10-CM | POA: Diagnosis not present

## 2018-01-05 DIAGNOSIS — R112 Nausea with vomiting, unspecified: Secondary | ICD-10-CM | POA: Diagnosis not present

## 2018-01-05 DIAGNOSIS — C20 Malignant neoplasm of rectum: Secondary | ICD-10-CM

## 2018-01-05 DIAGNOSIS — R197 Diarrhea, unspecified: Secondary | ICD-10-CM

## 2018-01-05 DIAGNOSIS — C770 Secondary and unspecified malignant neoplasm of lymph nodes of head, face and neck: Secondary | ICD-10-CM

## 2018-01-05 LAB — MAGNESIUM: Magnesium: 1.8 mg/dL (ref 1.7–2.4)

## 2018-01-05 LAB — CMP (CANCER CENTER ONLY)
ALBUMIN: 3.8 g/dL (ref 3.5–5.0)
ALK PHOS: 60 U/L (ref 38–126)
ALT: 13 U/L (ref 0–44)
ANION GAP: 9 (ref 5–15)
AST: 12 U/L — ABNORMAL LOW (ref 15–41)
BILIRUBIN TOTAL: 0.4 mg/dL (ref 0.3–1.2)
BUN: 16 mg/dL (ref 6–20)
CALCIUM: 9.7 mg/dL (ref 8.9–10.3)
CO2: 26 mmol/L (ref 22–32)
Chloride: 104 mmol/L (ref 98–111)
Creatinine: 1.04 mg/dL — ABNORMAL HIGH (ref 0.44–1.00)
GFR, Estimated: 59 mL/min — ABNORMAL LOW (ref >60)
GLUCOSE: 100 mg/dL — AB (ref 70–99)
POTASSIUM: 4 mmol/L (ref 3.5–5.1)
Sodium: 139 mmol/L (ref 135–145)
TOTAL PROTEIN: 6.9 g/dL (ref 6.5–8.1)

## 2018-01-05 LAB — CBC WITH DIFFERENTIAL (CANCER CENTER ONLY)
BASOS ABS: 0 10*3/uL (ref 0.0–0.1)
BASOS PCT: 1 %
Eosinophils Absolute: 0 10*3/uL (ref 0.0–0.5)
Eosinophils Relative: 0 %
HEMATOCRIT: 39.1 % (ref 34.8–46.6)
HEMOGLOBIN: 13.1 g/dL (ref 11.6–15.9)
LYMPHS PCT: 10 %
Lymphs Abs: 0.3 10*3/uL — ABNORMAL LOW (ref 0.9–3.3)
MCH: 32.3 pg (ref 25.1–34.0)
MCHC: 33.4 g/dL (ref 31.5–36.0)
MCV: 96.8 fL (ref 79.5–101.0)
MONO ABS: 0.2 10*3/uL (ref 0.1–0.9)
Monocytes Relative: 5 %
NEUTROS ABS: 2.8 10*3/uL (ref 1.5–6.5)
NEUTROS PCT: 84 %
Platelet Count: 191 10*3/uL (ref 145–400)
RBC: 4.04 MIL/uL (ref 3.70–5.45)
RDW: 13.6 % (ref 11.2–14.5)
WBC: 3.3 10*3/uL — AB (ref 3.9–10.3)

## 2018-01-05 MED ORDER — DIPHENOXYLATE-ATROPINE 2.5-0.025 MG PO TABS: 2.0000 | ORAL_TABLET | Freq: Four times a day (QID) | ORAL | 1 refills | Status: AC | PRN

## 2018-01-05 MED ORDER — LORAZEPAM 1 MG PO TABS: 0.5000 mg | ORAL_TABLET | Freq: Once | ORAL | Status: DC

## 2018-01-05 MED ORDER — LORAZEPAM 1 MG PO TABS
ORAL_TABLET | ORAL | Status: AC
Start: 2018-01-05 — End: ?
  Filled 2018-01-05: qty 1

## 2018-01-05 MED ORDER — HEPARIN SOD (PORK) LOCK FLUSH 100 UNIT/ML IV SOLN
500.0000 [IU] | Freq: Once | INTRAVENOUS | Status: AC | PRN
  Administered 2018-01-05: 500 [IU]
  Filled 2018-01-05: qty 5

## 2018-01-05 MED ORDER — SODIUM CHLORIDE 0.9% FLUSH
10.0000 mL | INTRAVENOUS | Status: DC | PRN
  Administered 2018-01-05: 10 mL
  Filled 2018-01-05: qty 10

## 2018-01-05 MED ORDER — LORAZEPAM 1 MG PO TABS
0.5000 mg | ORAL_TABLET | Freq: Once | ORAL | Status: AC
  Administered 2018-01-05: 0.5 mg via SUBLINGUAL

## 2018-01-08 ENCOUNTER — Telehealth: Payer: Self-pay | Admitting: Oncology

## 2018-01-08 NOTE — Progress Notes (Signed)
[facility] Progress Note   [name] [id number] 13-Apr-1961 57 y.o.  [name] is managed by Dr. Dominica Severin B. Sherrill  Actively treated with chemotherapy/immunotherapy: yes  Current Therapy: FOLFIRI  Last Treated:  01/03/2018 (cycle 9, day 1)  Assessment: Plan:    Diarrhea, unspecified type - Plan: diphenoxylate-atropine (LOMOTIL) 2.5-0.025 MG tablet  Non-intractable vomiting with nausea, unspecified vomiting type   Chemotherapy-induced diarrhea: The patient was given a prescription for Lomotil to use as needed.  She receives atropine with her chemotherapy.  A CBC and a chemistry panel were ordered today.  Chemotherapy associated nausea: The patient was given Ativan 0.5 mg sublingual.  She continues to use Compazine and Zofran as needed.  Please see After Visit Summary for patient specific instructions.  Future Appointments  Date Time Provider [place]  01/18/2018  8:30 AM [facility] LAB 3 [facility] None  01/18/2018  8:45 AM [facility] FLUSH [facility] None  01/18/2018  9:15 AM Ladell Pier, MD [facility] None  01/18/2018 11:00 AM [facility] INFUSION [facility] None  01/20/2018 10:00 AM [facility] None    No orders of the defined types were placed in this encounter.      Subjective:   Patient ID:  [name] is a 57 y.o. (DOB 01-24-1961) female.  Chief Complaint: No chief complaint on file.   HPI [name] is a 57 year old female with a history of a metastatic adenocarcinoma of the rectum who is managed by Dr. Dominica Severin B. Sherrill and is currently treated with FOLFIRI.  She is status post cycle 9, day 1 which was dosed on 01/03/2018.  She was seen in the infusion room with a report of watery diarrhea.  She is also had intermittent nausea.  She denies anorexia, fevers, chills, sweats, abdominal cramping or vomiting.  She has been on no recent antibiotics.  Medications: I have reviewed the patient's  current medications.  Allergies: No Known Allergies  Past Medical History:  Diagnosis Date  . Anxiety   . Arthritis 08-27-13   at present has ruptured disc- lower back-not an issue now  . Blood infection ([place])    E Coli after UTI   . Colon cancer ([place]) 08-27-13   radiation /chemo -last ended 4 weeks(Dr. Benay Spice)  . DVT (deep venous thrombosis) ([place])    left upper extremity currently has stopped xarelto   . History of radiation therapy 06/18/13-07/26/13   rectal 50.4Gy total dose  . Hypertension    toxemia with pregnancy 1990  . IUD (intrauterine device) in place 08-27-13   Mirena Implant inplace  . Nerve damage    numbness in pelvic area from rectal rection straining to empty bladder   . PONV (postoperative nausea and vomiting)    scopalamine patch helped  . Stress incontinence   . UTI (lower urinary tract infection)    hx of     Past Surgical History:  Procedure Laterality Date  . APPENDECTOMY    . BACK SURGERY    . bladder study      pressure readings had approx 1.5 months ago   . BREAST SURGERY     reduction surgery  . CERVICAL DISC ARTHROPLASTY  01/03/2012   Procedure: CERVICAL ANTERIOR DISC ARTHROPLASTY;  Surgeon: Charlie Pitter, MD;  Location: [facility];  Service: Neurosurgery;  Laterality: N/A;  Cervical Anterior Disc Arthorplasty Five-Six  . CESAREAN SECTION     x2  . EUS N/A 05/31/2013   Procedure: LOWER ENDOSCOPIC  ULTRASOUND (EUS);  Surgeon: Beryle Beams, MD;  Location: [facility];  Service: Endoscopy;  Laterality: N/A;  . ILEO LOOP COLOSTOMY CLOSURE N/A 05/02/2014   Procedure: LAPAROSCOPIC ASSISTED REVERSAL ILEOSTOMY AND LYSIS OF ADHESIONS;  Surgeon: [name], MD;  Location: [facility];  Service: General;  Laterality: N/A;  . INTRAUTERINE DEVICE INSERTION  2006   [facility]  . IR FLUORO GUIDE PORT INSERTION RIGHT  08/25/2017  . IR US GUIDE VASC ACCESS RIGHT  08/25/2017  . KNEE ARTHROSCOPY Left 08-27-13   torn meniscus repair left   . LAPAROSCOPIC LOW ANTERIOR  RESECTION N/A 09/02/2013   Procedure: LAPAROSCOPIC LOW ANTERIOR RESECTION;  Surgeon: [name], MD;  Location: [facility];  Service: General;  Laterality: N/A;  . LAPAROTOMY N/A 09/13/2013   Procedure: EXPLORATORY LAPAROTOMY / FLEXIBLE SIGMOIDOSCOPY/  DRAINAGE OF PELVIC ABSCESS WITH  DIVERTING  LOOP  ILEOSTOMY;  Surgeon: [name], MD;  Location: [facility];  Service: General;  Laterality: N/A;  . LYSIS OF ADHESION  05/02/2014   Procedure: LYSIS OF ADHESION;  Surgeon: [name], MD;  Location: [facility];  Service: General;;  . PORT-A-CATH REMOVAL Left 06/09/2014   Procedure: REMOVAL PORT-A-CATH  (MINOR PROCEDURE) ;  Surgeon: [name], MD;  Location: [facility];  Service: General;  Laterality: Left;  . PORTACATH PLACEMENT Left 10/11/2013   Procedure: INSERTION PORT-A-CATH;  Surgeon: [name], MD;  Location: [facility];  Service: General;  Laterality: Left;  . REFRACTIVE SURGERY      No family history on file.  Social History   Socioeconomic History  . Marital status: Married    Spouse name: Not on file  . Number of children: Not on file  . Years of education: Not on file  . Highest education level: Not on file  Occupational History  . Not on file  Social Needs  . Financial resource strain: Not on file  . Food insecurity:    Worry: Not on file    Inability: Not on file  . Transportation needs:    Medical: Not on file    Non-medical: Not on file  Tobacco Use  . Smoking status: Never Smoker  . Smokeless tobacco: Never Used  Substance and Sexual Activity  . Alcohol use: Yes    Comment: weekends   . Drug use: No  . Sexual activity: Yes    Birth control/protection: IUD  Lifestyle  . Physical activity:    Days per week: Not on file    Minutes per session: Not on file  . Stress: Not on file  Relationships  . Social connections:    Talks on phone: Not on file    Gets together: Not on file    Attends religious service: Not on file    Active member of club  or organization: Not on file    Attends meetings of clubs or organizations: Not on file    Relationship status: Not on file  . Intimate partner violence:    Fear of current or ex partner: Not on file    Emotionally abused: Not on file    Physically abused: Not on file    Forced sexual activity: Not on file  Other Topics Concern  . Not on file  Social History Narrative  . Not on file    Past Medical History, Surgical history, Social history, and Family history were reviewed and updated as appropriate.   Please see review of systems for further details on the patient's review  from today.   Review of Systems:  Review of Systems  Constitutional: Negative for appetite change, chills, diaphoresis and fever.  Respiratory: Negative for cough, chest tightness and shortness of breath.   Cardiovascular: Negative for chest pain, palpitations and leg swelling.  Gastrointestinal: Positive for diarrhea. Negative for abdominal distention, abdominal pain, blood in stool, constipation, nausea and vomiting.  Genitourinary: Negative for decreased urine volume and difficulty urinating.  Neurological: Negative for weakness.    Objective:   Physical Exam:  There were no vitals taken for this visit. ECOG: 0  Physical Exam  Constitutional: No distress.  HENT:  Head: Normocephalic and atraumatic.  Mouth/Throat: No oropharyngeal exudate.  Cardiovascular: Normal rate, regular rhythm and normal heart sounds. Exam reveals no gallop and no friction rub.  No murmur heard. Pulmonary/Chest: Effort normal and breath sounds normal. No respiratory distress. She has no wheezes. She has no rales.  Abdominal: Soft. Bowel sounds are normal. She exhibits no distension and no mass. There is no tenderness. There is no rebound and no guarding.  Neurological: She is alert.  Skin: Skin is warm and dry. No rash noted. She is not diaphoretic. No erythema.  Psychiatric: She has a normal mood and affect. Her behavior is  normal. Judgment and thought content normal.    Lab Review:     Component Value Date/Time   NA 139 01/05/2018 1156   NA 138 11/08/2016 1205   K 4.0 01/05/2018 1156   K 4.2 11/08/2016 1205   CL 104 01/05/2018 1156   CO2 26 01/05/2018 1156   CO2 26 11/08/2016 1205   GLUCOSE 100 (H) 01/05/2018 1156   GLUCOSE 90 11/08/2016 1205   BUN 16 01/05/2018 1156   BUN 23.0 11/08/2016 1205   CREATININE 1.04 (H) 01/05/2018 1156   CREATININE 1.0 11/08/2016 1205   CALCIUM 9.7 01/05/2018 1156   CALCIUM 9.6 11/08/2016 1205   PROT 6.9 01/05/2018 1156   PROT 6.5 06/02/2014 1004   ALBUMIN 3.8 01/05/2018 1156   ALBUMIN 3.4 (L) 06/02/2014 1004   AST 12 (L) 01/05/2018 1156   AST 16 06/02/2014 1004   ALT 13 01/05/2018 1156   ALT 18 06/02/2014 1004   ALKPHOS 60 01/05/2018 1156   ALKPHOS 69 06/02/2014 1004   BILITOT 0.4 01/05/2018 1156   BILITOT 0.36 06/02/2014 1004   GFRNONAA 59 (L) 01/05/2018 1156   GFRAA >60 01/05/2018 1156       Component Value Date/Time   WBC 3.3 (L) 01/05/2018 1156   WBC 4.7 08/25/2017 0841   RBC 4.04 01/05/2018 1156   HGB 13.1 01/05/2018 1156   HGB 14.2 06/16/2015 0816   HCT 39.1 01/05/2018 1156   HCT 42.6 06/16/2015 0816   PLT 191 01/05/2018 1156   PLT 169 06/16/2015 0816   MCV 96.8 01/05/2018 1156   MCV 92.1 06/16/2015 0816   MCH 32.3 01/05/2018 1156   MCHC 33.4 01/05/2018 1156   RDW 13.6 01/05/2018 1156   RDW 12.3 06/16/2015 0816   LYMPHSABS 0.3 (L) 01/05/2018 1156   LYMPHSABS 0.6 (L) 06/16/2015 0816   MONOABS 0.2 01/05/2018 1156   MONOABS 0.3 06/16/2015 0816   EOSABS 0.0 01/05/2018 1156   EOSABS 0.1 06/16/2015 0816   BASOSABS 0.0 01/05/2018 1156   BASOSABS 0.0 06/16/2015 0816   -------------------------------  Imaging from last 24 hours (if applicable):  Radiology interpretation: No results found.

## 2018-01-08 NOTE — Telephone Encounter (Signed)
Printed medical records to [employer] on 01/08/18, Release ID: [id number]

## 2018-01-09 ENCOUNTER — Other Ambulatory Visit: Payer: Self-pay | Admitting: Emergency Medicine

## 2018-01-09 DIAGNOSIS — C20 Malignant neoplasm of rectum: Secondary | ICD-10-CM

## 2018-01-09 MED ORDER — POTASSIUM CHLORIDE ER 10 MEQ PO CPCR: 10.0000 meq | ORAL_CAPSULE | Freq: Two times a day (BID) | ORAL | 0 refills | Status: DC

## 2018-01-09 MED ORDER — ACYCLOVIR 400 MG PO TABS
400.0000 mg | ORAL_TABLET | Freq: Two times a day (BID) | ORAL | 1 refills | Status: DC | PRN
Start: 2018-01-09 — End: 2018-06-12

## 2018-01-13 ENCOUNTER — Other Ambulatory Visit: Payer: Self-pay | Admitting: Oncology

## 2018-01-17 ENCOUNTER — Ambulatory Visit: Payer: 59 | Admitting: Nurse Practitioner

## 2018-01-18 ENCOUNTER — Inpatient Hospital Stay: Payer: 59

## 2018-01-18 ENCOUNTER — Inpatient Hospital Stay (HOSPITAL_BASED_OUTPATIENT_CLINIC_OR_DEPARTMENT_OTHER): Payer: 59 | Admitting: Oncology

## 2018-01-18 ENCOUNTER — Inpatient Hospital Stay: Payer: 59 | Attending: Nurse Practitioner

## 2018-01-18 VITALS — BP 110/73 | HR 80 | Temp 98.2°F | Resp 18 | Ht 64.0 in | Wt 153.0 lb

## 2018-01-18 DIAGNOSIS — Z5111 Encounter for antineoplastic chemotherapy: Secondary | ICD-10-CM | POA: Insufficient documentation

## 2018-01-18 DIAGNOSIS — C20 Malignant neoplasm of rectum: Secondary | ICD-10-CM | POA: Diagnosis present

## 2018-01-18 DIAGNOSIS — Z95828 Presence of other vascular implants and grafts: Secondary | ICD-10-CM

## 2018-01-18 DIAGNOSIS — Z9221 Personal history of antineoplastic chemotherapy: Secondary | ICD-10-CM | POA: Diagnosis not present

## 2018-01-18 DIAGNOSIS — Z923 Personal history of irradiation: Secondary | ICD-10-CM | POA: Diagnosis not present

## 2018-01-18 DIAGNOSIS — N390 Urinary tract infection, site not specified: Secondary | ICD-10-CM

## 2018-01-18 DIAGNOSIS — R319 Hematuria, unspecified: Secondary | ICD-10-CM

## 2018-01-18 LAB — CBC WITH DIFFERENTIAL (CANCER CENTER ONLY)
Basophils Absolute: 0 10*3/uL (ref 0.0–0.1)
Basophils Relative: 0 %
EOS ABS: 0.1 10*3/uL (ref 0.0–0.5)
EOS PCT: 4 %
HCT: 38.4 % (ref 34.8–46.6)
Hemoglobin: 12.8 g/dL (ref 11.6–15.9)
Lymphocytes Relative: 13 %
Lymphs Abs: 0.4 10*3/uL — ABNORMAL LOW (ref 0.9–3.3)
MCH: 32.6 pg (ref 25.1–34.0)
MCHC: 33.3 g/dL (ref 31.5–36.0)
MCV: 97.7 fL (ref 79.5–101.0)
Monocytes Absolute: 0.3 10*3/uL (ref 0.1–0.9)
Monocytes Relative: 9 %
Neutro Abs: 2.3 10*3/uL (ref 1.5–6.5)
Neutrophils Relative %: 74 %
PLATELETS: 225 10*3/uL (ref 145–400)
RBC: 3.93 MIL/uL (ref 3.70–5.45)
RDW: 13 % (ref 11.2–14.5)
WBC Count: 3.2 10*3/uL — ABNORMAL LOW (ref 3.9–10.3)

## 2018-01-18 LAB — CMP (CANCER CENTER ONLY)
ALT: 10 U/L (ref 0–44)
ANION GAP: 9 (ref 5–15)
AST: 10 U/L — ABNORMAL LOW (ref 15–41)
Albumin: 3.8 g/dL (ref 3.5–5.0)
Alkaline Phosphatase: 65 U/L (ref 38–126)
BUN: 20 mg/dL (ref 6–20)
CHLORIDE: 110 mmol/L (ref 98–111)
CO2: 22 mmol/L (ref 22–32)
CREATININE: 1.03 mg/dL — AB (ref 0.44–1.00)
Calcium: 9.3 mg/dL (ref 8.9–10.3)
GFR, Est AFR Am: >60 mL/min (ref >60)
GFR, Estimated: >60 mL/min (ref >60)
Glucose, Bld: 103 mg/dL — ABNORMAL HIGH (ref 70–99)
POTASSIUM: 4 mmol/L (ref 3.5–5.1)
SODIUM: 141 mmol/L (ref 135–145)
Total Bilirubin: 0.4 mg/dL (ref 0.3–1.2)
Total Protein: 7.1 g/dL (ref 6.5–8.1)

## 2018-01-18 LAB — CEA (IN HOUSE-CHCC): CEA (CHCC-IN HOUSE): 7.62 ng/mL — AB (ref 0.00–5.00)

## 2018-01-18 MED ORDER — SODIUM CHLORIDE 0.9 % IV SOLN
INTRAVENOUS | Status: DC
  Administered 2018-01-18: 11:00:00 via INTRAVENOUS
  Filled 2018-01-18: qty 250

## 2018-01-18 MED ORDER — PALONOSETRON HCL INJECTION 0.25 MG/5ML
INTRAVENOUS | Status: AC
  Filled 2018-01-18: qty 5

## 2018-01-18 MED ORDER — ATROPINE SULFATE 1 MG/ML IJ SOLN
0.5000 mg | Freq: Once | INTRAMUSCULAR | Status: AC | PRN
  Administered 2018-01-18: 0.5 mg via INTRAVENOUS

## 2018-01-18 MED ORDER — IRINOTECAN HCL CHEMO INJECTION 100 MG/5ML
180.0000 mg/m2 | Freq: Once | INTRAVENOUS | Status: AC
  Administered 2018-01-18: 320 mg via INTRAVENOUS
  Filled 2018-01-18: qty 15

## 2018-01-18 MED ORDER — FLUOROURACIL CHEMO INJECTION 5 GM/100ML
2400.0000 mg/m2 | INTRAVENOUS | Status: DC
  Administered 2018-01-18: 4250 mg via INTRAVENOUS
  Filled 2018-01-18: qty 85

## 2018-01-18 MED ORDER — SODIUM CHLORIDE 0.9 % IV SOLN
Freq: Once | INTRAVENOUS | Status: AC
  Administered 2018-01-18: 11:00:00 via INTRAVENOUS
  Filled 2018-01-18: qty 250

## 2018-01-18 MED ORDER — SODIUM CHLORIDE 0.9% FLUSH
10.0000 mL | INTRAVENOUS | Status: DC | PRN
  Administered 2018-01-18: 10 mL
  Filled 2018-01-18: qty 10

## 2018-01-18 MED ORDER — SODIUM CHLORIDE 0.9 % IV SOLN
Freq: Once | INTRAVENOUS | Status: AC
  Administered 2018-01-18: 11:00:00 via INTRAVENOUS
  Filled 2018-01-18: qty 5

## 2018-01-18 MED ORDER — ATROPINE SULFATE 1 MG/ML IJ SOLN
INTRAMUSCULAR | Status: AC
  Filled 2018-01-18: qty 1

## 2018-01-18 MED ORDER — FLUOROURACIL CHEMO INJECTION 2.5 GM/50ML
400.0000 mg/m2 | Freq: Once | INTRAVENOUS | Status: AC
  Administered 2018-01-18: 700 mg via INTRAVENOUS
  Filled 2018-01-18: qty 14

## 2018-01-18 MED ORDER — PALONOSETRON HCL INJECTION 0.25 MG/5ML
0.2500 mg | Freq: Once | INTRAVENOUS | Status: AC
  Administered 2018-01-18: 0.25 mg via INTRAVENOUS

## 2018-01-18 MED ORDER — LEUCOVORIN CALCIUM INJECTION 350 MG
400.0000 mg/m2 | Freq: Once | INTRAVENOUS | Status: AC
  Administered 2018-01-18: 712 mg via INTRAVENOUS
  Filled 2018-01-18: qty 35.6

## 2018-01-18 MED ORDER — HYDROCODONE-ACETAMINOPHEN 5-325 MG PO TABS: 1.0000 | ORAL_TABLET | Freq: Two times a day (BID) | ORAL | 0 refills | Status: DC | PRN

## 2018-01-18 NOTE — Patient Instructions (Signed)
[facility] Discharge Instructions for Patients Receiving Chemotherapy  Today you received the following chemotherapy agents: Irinotecan, Leucovorin, 5FU  To help prevent nausea and vomiting after your treatment, we encourage you to take your nausea medication as directed.   If you develop nausea and vomiting that is not controlled by your nausea medication, call the clinic.   BELOW ARE SYMPTOMS THAT SHOULD BE REPORTED IMMEDIATELY:  *FEVER GREATER THAN 100.5 F  *CHILLS WITH OR WITHOUT FEVER  NAUSEA AND VOMITING THAT IS NOT CONTROLLED WITH YOUR NAUSEA MEDICATION  *UNUSUAL SHORTNESS OF BREATH  *UNUSUAL BRUISING OR BLEEDING  TENDERNESS IN MOUTH AND THROAT WITH OR WITHOUT PRESENCE OF ULCERS  *URINARY PROBLEMS  *BOWEL PROBLEMS  UNUSUAL RASH Items with * indicate a potential emergency and should be followed up as soon as possible.  Feel free to call the clinic should you have any questions or concerns. The clinic phone number is [phone].  Please show the CHEMO ALERT CARD at check-in to the Emergency Department and triage nurse.   

## 2018-01-18 NOTE — Progress Notes (Signed)
[facility] OFFICE PROGRESS NOTE   Diagnosis: Rectal cancer  INTERVAL HISTORY:   [name] completed another cycle of FOLFIRI on 01/03/2018.  She reports less toxicity from the cycle of chemotherapy.  She continues to have intermittent diarrhea.  No abdominal pain.  Objective:  Vital signs in last 24 hours:  Blood pressure 110/73, pulse 80, temperature 98.2 F (36.8 C), temperature source Oral, resp. rate 18, height 5' 4"  (1.626 m), weight 153 lb (69.4 kg), SpO2 98 %.    HEENT: No thrush or ulcers Lymphatics: Less than 1 cm medial left supraclavicular/scalene node Resp: Lungs clear bilaterally Cardio: Regular rate and rhythm GI: No hepatomegaly, nontender Vascular: No leg edema    Portacath/PICC-without erythema  Lab Results:  Lab Results  Component Value Date   WBC 3.2 (L) 01/18/2018   HGB 12.8 01/18/2018   HCT 38.4 01/18/2018   MCV 97.7 01/18/2018   PLT 225 01/18/2018   NEUTROABS 2.3 01/18/2018    CMP  Lab Results  Component Value Date   NA 139 01/05/2018   K 4.0 01/05/2018   CL 104 01/05/2018   CO2 26 01/05/2018   GLUCOSE 100 (H) 01/05/2018   BUN 16 01/05/2018   CREATININE 1.04 (H) 01/05/2018   CALCIUM 9.7 01/05/2018   PROT 6.9 01/05/2018   ALBUMIN 3.8 01/05/2018   AST 12 (L) 01/05/2018   ALT 13 01/05/2018   ALKPHOS 60 01/05/2018   BILITOT 0.4 01/05/2018   GFRNONAA 59 (L) 01/05/2018   GFRAA >60 01/05/2018    Lab Results  Component Value Date   CEA1 5.81 (H) 12/11/2017     Medications: I have reviewed the patient's current medications.   Assessment/Plan: 1. Rectal cancer, clinical stage III (T3 N1) status post biopsy of a rectal mass on 05/24/2013 confirming adenocarcinoma. Endoscopic ultrasound 05/31/2013 measured the mass at 11 cm from the anal verge, uT3uN1.  Initiation of radiation and concurrent Xeloda 06/18/2013; completion 07/26/2013.   Status post low anterior resection 09/02/2013. Invasive adenocarcinoma, 3.7  cm, negative margins; metastatic carcinoma in 7 of 11 lymph nodes; extensive lymph vascular involvement by tumor.MSI-stable, no loss of mismatch repair protein expression  Cycle 1 adjuvant FOLFOX 10/15/2013.   Cycle 2 adjuvant FOLFOX 10/29/2013.   Cycle 3 adjuvant FOLFOX 11/14/2013.   Cycle 4 adjuvant FOLFOX 11/27/2013.   Cycle 5 adjuvant FOLFOX 12/10/2013   Cycle 6 adjuvant FOLFOX 12/24/2013.   Cycle 7 adjuvant FOLFOX 01/14/2014.   Cycle 8 adjuvant FOLFOX 01/28/2014   Cycle 9 adjuvant FOLFOX 02/11/2014 (oxaliplatin dose reduced secondary to thrombocytopenia)   Surveillance CT scans 06/02/2014 with no evidence of recurrent rectal cancer  surveillance CT scans 06/16/2015 with no evidence of recurrent rectal cancer, 10 mm right external iliac node , increased amount of loculated air associated with the presacral soft tissue thickening , new left hydronephrosis  Surveillance CT scans 05/06/2016-negative for recurrent rectal cancer, decreased right external iliac node, persistent left hydronephrosis  CT abdomen/pelvis 07/05/2017-new left para-aortic, aortocaval, right common iliac and right external iliac lymphadenopathy.  CT neck/chest 07/13/2017-enlarged left supraclavicular nodes, new paratracheal nodes  Ultrasound-guided biopsy of a left supraclavicular lymph node 07/25/2017-metastatic adenocarcinoma consistent with colorectal cancer,MSI-stable, tumor mutation burden-10, K-ras S56mtation  Cycle 1 FOLFIRI 08/28/2017  Cycle 2 FOLFIRI 09/11/2017  Cycle 3 FOLFIRI 09/25/2017  Cycle 4 FOLFIRI 10/09/2017  Cycle 5 FOLFIRI 10/23/2017  CTs 11/14/2017-slight decrease in left supraclavicular adenopathy, slight increase and decrease in mediastinal/retroperitoneal/pelvic nodes, no new disease  Cycle 6 FOLFIRI 11/14/2017  Cycle 7 FOLFIRI  11/27/2017  Cycle 8 FOLFIRI 12/11/2017   Cycle 9 FOLFIRI 01/03/2018  Cycle 10 FOLFIRI 01/18/2018 2. Post colonoscopy bowel perforation confirmed  on abdomen CT 05/28/2013. 3. Single indeterminate liver lesion on the abdomen CT 05/28/2013. MRI on 06/18/2013 confirmed 2 tiny liver lesions, indeterminate-favored to be benign cysts. 4. History of cervical and lumbar disc disease.  5. History of HELLP syndrome with first pregnancy.  6. Delayed nausea following cycle 1 FOLFOX. She receives Aloxi and Emend. She continues to experience delayed nausea. Prophylactic Decadron added with cycle 4. 7. History of pain with bowel movements. 8. Anastomotic leak on CT 09/13/2013. Status post exploratory laparotomy with drainage of a pelvic abscess, diverting loop ileostomy. Drainage catheter removed 10/23/2013.  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 Perineal numbness, urinary retention. The urinary retention has improved. 10. Perineal pain-improved. 11. Neutropenia secondary chemotherapy-she received Neulasta. 12. Urinary tract infection, Escherichia coli-11/03/2013. 13. Venous engorgement at the left anterior chest and left arm with swelling of the left arm and hand. Venous Doppler 11/14/2013 positive for DVT. She began Lovenox 11/14/2013 and was switched to xarelto 12/10/2013. Xarelto discontinued early January 2016. 14. Fever 11/20/2013. Blood and urine cultures negative. The fever resolved within 24 hours. She continued to have a single episode of fever following chemotherapy 15. History of a herpetic lip lesion 16. 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 17. Escherichia coli bacteremia, urinary source, 01/14/2014. She completed a 10 day course of ceftriaxone. 74. 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 Anxiety/depression. 20. Port-A-Cath removal 06/09/2014. 21. Escherichia coli urinary tract infection 06/24/2014. She completed a course of ciprofloxacin. 22. frequent bowel movements  following surgery/radiation -status post placement of a sacral stimulator 11/21/2014 23. CT 06/16/2015 with new left hydronephrosis status post cystoscopy with placement of a double-J stent 07/17/2015. No tumors or stones were seen in the bladder. Both ureteral orifices were unremarkable. On the left side retrograde study was performed. The distal ureter was narrowed and then a little bit more dilated proximally with hydronephrosis noted. There was no evidence of intramural tumor but the area did seem a bit strictured.  Stent removed, persistent hydronephrosis, followed by Dr. Amalia Hailey  CT 05/06/2016-increased left hydronephrosis  Ultrasound 09/09/2016-stable left hydroureteronephrosis  Left nephrectomy 04/28/2017  24.CT abdomen/pelvis 07/05/2017-new left para-aortic, aortocaval, right common iliac and right external iliac lymphadenopathy.   Disposition: She appears unchanged.  She will complete another cycle of FOLFIRI today.  She will undergo restaging CTs after this cycle.  [name] will return for an office visit and planned FOLFIRI in 2 weeks.  15 minutes were spent with the patient today.  The majority of the time was used for counseling and coordination of care.  Betsy Coder, MD  01/18/2018  9:28 AM

## 2018-01-18 NOTE — Addendum Note (Signed)
Addended by: Mathis Fare on: 01/18/2018 12:49 PM   Modules accepted: Orders

## 2018-01-19 ENCOUNTER — Telehealth: Payer: Self-pay

## 2018-01-19 ENCOUNTER — Other Ambulatory Visit: Payer: Self-pay | Admitting: Medical

## 2018-01-19 ENCOUNTER — Telehealth: Payer: Self-pay | Admitting: Oncology

## 2018-01-19 DIAGNOSIS — C20 Malignant neoplasm of rectum: Secondary | ICD-10-CM

## 2018-01-19 MED ORDER — HYDROCODONE-ACETAMINOPHEN 5-325 MG PO TABS: 1.0000 | ORAL_TABLET | Freq: Two times a day (BID) | ORAL | 0 refills | Status: DC | PRN

## 2018-01-19 NOTE — Telephone Encounter (Signed)
Return visit scheduled/ added patient to infusion Log book for treatment on 8/14/ per 8/1 los

## 2018-01-19 NOTE — Telephone Encounter (Signed)
Received call from [facility] pharmacy to verify that refill has been authorized for hydrocodone for pt. This RN confirmed that pt was provided with hydrocodone script paper to bring to pharmacy. Pharmacy staff indicates that new script must be sent in. This RN voiced understanding and will alert MD.   Unable to reach pharmacy staff to confirm if prescription was dropped off and misplaced or if they have not received a prescription at all. LVM on pharmacy provider line.   Spoke with pt to confirm that pt is out of the Norco pills. This RN will alert MD.   Pharmacy confirmed that pt stated that prescription was dropped off and pharmacy does not have recepit of it or prescription. Per Ned Card, ok to consult a physician in practice to see if they are comfortable refilling.    This RN consulted Sandi Mealy, PA. Prescription refilled to pharmacy. Pt voiced understanding

## 2018-01-20 ENCOUNTER — Inpatient Hospital Stay: Payer: 59

## 2018-01-20 VITALS — BP 107/68 | HR 92 | Temp 98.6°F | Resp 18

## 2018-01-20 DIAGNOSIS — C20 Malignant neoplasm of rectum: Secondary | ICD-10-CM

## 2018-01-20 DIAGNOSIS — Z5111 Encounter for antineoplastic chemotherapy: Secondary | ICD-10-CM | POA: Diagnosis not present

## 2018-01-20 MED ORDER — HEPARIN SOD (PORK) LOCK FLUSH 100 UNIT/ML IV SOLN
500.0000 [IU] | Freq: Once | INTRAVENOUS | Status: AC | PRN
Start: 2018-01-20 — End: 2018-01-20
  Administered 2018-01-20: 500 [IU]
  Filled 2018-01-20: qty 5

## 2018-01-20 MED ORDER — SODIUM CHLORIDE 0.9% FLUSH
10.0000 mL | INTRAVENOUS | Status: DC | PRN
  Administered 2018-01-20: 10 mL
  Filled 2018-01-20: qty 10

## 2018-01-20 NOTE — Patient Instructions (Signed)
Implanted Port Home Guide An implanted port is a type of central line that is placed under the skin. Central lines are used to provide IV access when treatment or nutrition needs to be given through a person's veins. Implanted ports are used for long-term IV access. An implanted port may be placed because:  You need IV medicine that would be irritating to the small veins in your hands or arms.  You need long-term IV medicines, such as antibiotics.  You need IV nutrition for a long period.  You need frequent blood draws for lab tests.  You need dialysis.  Implanted ports are usually placed in the chest area, but they can also be placed in the upper arm, the abdomen, or the leg. An implanted port has two main parts:  Reservoir. The reservoir is round and will appear as a small, raised area under your skin. The reservoir is the part where a needle is inserted to give medicines or draw blood.  Catheter. The catheter is a thin, flexible tube that extends from the reservoir. The catheter is placed into a large vein. Medicine that is inserted into the reservoir goes into the catheter and then into the vein.  How will I care for my incision site? Do not get the incision site wet. Bathe or shower as directed by your health care provider. How is my port accessed? Special steps must be taken to access the port:  Before the port is accessed, a numbing cream can be placed on the skin. This helps numb the skin over the port site.  Your health care provider uses a sterile technique to access the port. ? Your health care provider must put on a mask and sterile gloves. ? The skin over your port is cleaned carefully with an antiseptic and allowed to dry. ? The port is gently pinched between sterile gloves, and a needle is inserted into the port.  Only "non-coring" port needles should be used to access the port. Once the port is accessed, a blood return should be checked. This helps ensure that the port  is in the vein and is not clogged.  If your port needs to remain accessed for a constant infusion, a clear (transparent) bandage will be placed over the needle site. The bandage and needle will need to be changed every week, or as directed by your health care provider.  Keep the bandage covering the needle clean and dry. Do not get it wet. Follow your health care provider's instructions on how to take a shower or bath while the port is accessed.  If your port does not need to stay accessed, no bandage is needed over the port.  What is flushing? Flushing helps keep the port from getting clogged. Follow your health care provider's instructions on how and when to flush the port. Ports are usually flushed with saline solution or a medicine called heparin. The need for flushing will depend on how the port is used.  If the port is used for intermittent medicines or blood draws, the port will need to be flushed: ? After medicines have been given. ? After blood has been drawn. ? As part of routine maintenance.  If a constant infusion is running, the port may not need to be flushed.  How long will my port stay implanted? The port can stay in for as long as your health care provider thinks it is needed. When it is time for the port to come out, surgery will be   done to remove it. The procedure is similar to the one performed when the port was put in. When should I seek immediate medical care? When you have an implanted port, you should seek immediate medical care if:  You notice a bad smell coming from the incision site.  You have swelling, redness, or drainage at the incision site.  You have more swelling or pain at the port site or the surrounding area.  You have a fever that is not controlled with medicine.  This information is not intended to replace advice given to you by your health care provider. Make sure you discuss any questions you have with your health care provider. Document  Released: 06/06/2005 Document Revised: 11/12/2015 Document Reviewed: 02/11/2013 Elsevier Interactive Patient Education  2017 [employer]  

## 2018-01-28 ENCOUNTER — Other Ambulatory Visit: Payer: Self-pay | Admitting: Oncology

## 2018-01-29 ENCOUNTER — Ambulatory Visit (HOSPITAL_COMMUNITY)
Admission: RE | Admit: 2018-01-29 | Discharge: 2018-01-29 | Disposition: A | Discharge status: Home / Self Care | Payer: 59 | Source: Ambulatory Visit | Attending: Oncology | Admitting: Oncology

## 2018-01-29 ENCOUNTER — Telehealth: Payer: Self-pay | Admitting: Oncology

## 2018-01-29 ENCOUNTER — Encounter (HOSPITAL_COMMUNITY): Payer: Self-pay

## 2018-01-29 DIAGNOSIS — N7011 Chronic salpingitis: Secondary | ICD-10-CM | POA: Diagnosis not present

## 2018-01-29 DIAGNOSIS — R599 Enlarged lymph nodes, unspecified: Secondary | ICD-10-CM | POA: Insufficient documentation

## 2018-01-29 DIAGNOSIS — C20 Malignant neoplasm of rectum: Secondary | ICD-10-CM | POA: Diagnosis present

## 2018-01-29 MED ORDER — IOHEXOL 300 MG/ML  SOLN
100.0000 mL | Freq: Once | INTRAMUSCULAR | Status: AC | PRN
  Administered 2018-01-29: 100 mL via INTRAVENOUS

## 2018-01-29 NOTE — Telephone Encounter (Signed)
Patient was notified of scheduled appointments added per 8/1 los

## 2018-01-31 ENCOUNTER — Inpatient Hospital Stay: Payer: 59

## 2018-01-31 ENCOUNTER — Encounter: Payer: Self-pay | Admitting: Nurse Practitioner

## 2018-01-31 ENCOUNTER — Telehealth: Payer: Self-pay | Admitting: Nurse Practitioner

## 2018-01-31 ENCOUNTER — Inpatient Hospital Stay (HOSPITAL_BASED_OUTPATIENT_CLINIC_OR_DEPARTMENT_OTHER): Payer: 59 | Admitting: Nurse Practitioner

## 2018-01-31 VITALS — BP 129/89 | HR 71 | Temp 97.8°F | Wt 154.6 lb

## 2018-01-31 DIAGNOSIS — Z923 Personal history of irradiation: Secondary | ICD-10-CM

## 2018-01-31 DIAGNOSIS — N390 Urinary tract infection, site not specified: Secondary | ICD-10-CM

## 2018-01-31 DIAGNOSIS — Z9221 Personal history of antineoplastic chemotherapy: Secondary | ICD-10-CM | POA: Diagnosis not present

## 2018-01-31 DIAGNOSIS — C20 Malignant neoplasm of rectum: Secondary | ICD-10-CM

## 2018-01-31 DIAGNOSIS — R319 Hematuria, unspecified: Principal | ICD-10-CM

## 2018-01-31 DIAGNOSIS — Z5111 Encounter for antineoplastic chemotherapy: Secondary | ICD-10-CM | POA: Diagnosis not present

## 2018-01-31 DIAGNOSIS — Z95828 Presence of other vascular implants and grafts: Secondary | ICD-10-CM

## 2018-01-31 LAB — CMP (CANCER CENTER ONLY)
ALBUMIN: 3.8 g/dL (ref 3.5–5.0)
ALK PHOS: 63 U/L (ref 38–126)
ALT: 31 U/L (ref 0–44)
ANION GAP: 10 (ref 5–15)
AST: 23 U/L (ref 15–41)
BUN: 17 mg/dL (ref 6–20)
CHLORIDE: 109 mmol/L (ref 98–111)
CO2: 20 mmol/L — AB (ref 22–32)
Calcium: 9 mg/dL (ref 8.9–10.3)
Creatinine: 0.97 mg/dL (ref 0.44–1.00)
GFR, Est AFR Am: >60 mL/min (ref >60)
GFR, Estimated: >60 mL/min (ref >60)
GLUCOSE: 110 mg/dL — AB (ref 70–99)
POTASSIUM: 3.8 mmol/L (ref 3.5–5.1)
SODIUM: 139 mmol/L (ref 135–145)
Total Bilirubin: 0.3 mg/dL (ref 0.3–1.2)
Total Protein: 7 g/dL (ref 6.5–8.1)

## 2018-01-31 LAB — CBC WITH DIFFERENTIAL (CANCER CENTER ONLY)
Basophils Absolute: 0 10*3/uL (ref 0.0–0.1)
Basophils Relative: 1 %
Eosinophils Absolute: 0.2 10*3/uL (ref 0.0–0.5)
Eosinophils Relative: 5 %
HEMATOCRIT: 37.1 % (ref 34.8–46.6)
HEMOGLOBIN: 12.3 g/dL (ref 11.6–15.9)
LYMPHS ABS: 0.5 10*3/uL — AB (ref 0.9–3.3)
LYMPHS PCT: 15 %
MCH: 32.2 pg (ref 25.1–34.0)
MCHC: 33.2 g/dL (ref 31.5–36.0)
MCV: 97.1 fL (ref 79.5–101.0)
MONO ABS: 0.3 10*3/uL (ref 0.1–0.9)
MONOS PCT: 8 %
NEUTROS PCT: 71 %
Neutro Abs: 2.4 10*3/uL (ref 1.5–6.5)
Platelet Count: 194 10*3/uL (ref 145–400)
RBC: 3.82 MIL/uL (ref 3.70–5.45)
RDW: 13.1 % (ref 11.2–14.5)
WBC Count: 3.4 10*3/uL — ABNORMAL LOW (ref 3.9–10.3)

## 2018-01-31 MED ORDER — SODIUM CHLORIDE 0.9% FLUSH
10.0000 mL | INTRAVENOUS | Status: DC | PRN
  Administered 2018-01-31: 10 mL
  Filled 2018-01-31: qty 10

## 2018-01-31 MED ORDER — LEUCOVORIN CALCIUM INJECTION 350 MG
400.0000 mg/m2 | Freq: Once | INTRAVENOUS | Status: AC
  Administered 2018-01-31: 712 mg via INTRAVENOUS
  Filled 2018-01-31: qty 35.6

## 2018-01-31 MED ORDER — IRINOTECAN HCL CHEMO INJECTION 100 MG/5ML
180.0000 mg/m2 | Freq: Once | INTRAVENOUS | Status: AC
  Administered 2018-01-31: 320 mg via INTRAVENOUS
  Filled 2018-01-31: qty 5

## 2018-01-31 MED ORDER — PALONOSETRON HCL INJECTION 0.25 MG/5ML
0.2500 mg | Freq: Once | INTRAVENOUS | Status: AC
  Administered 2018-01-31: 0.25 mg via INTRAVENOUS

## 2018-01-31 MED ORDER — SODIUM CHLORIDE 0.9 % IV SOLN
2400.0000 mg/m2 | INTRAVENOUS | Status: DC
  Administered 2018-01-31: 4250 mg via INTRAVENOUS
  Filled 2018-01-31: qty 85

## 2018-01-31 MED ORDER — SODIUM CHLORIDE 0.9 % IV SOLN
Freq: Once | INTRAVENOUS | Status: AC
  Administered 2018-01-31: 12:00:00 via INTRAVENOUS
  Filled 2018-01-31: qty 250

## 2018-01-31 MED ORDER — FLUOROURACIL CHEMO INJECTION 2.5 GM/50ML
400.0000 mg/m2 | Freq: Once | INTRAVENOUS | Status: AC
  Administered 2018-01-31: 700 mg via INTRAVENOUS
  Filled 2018-01-31: qty 14

## 2018-01-31 MED ORDER — SODIUM CHLORIDE 0.9 % IV SOLN
Freq: Once | INTRAVENOUS | Status: AC
  Administered 2018-01-31: 11:00:00 via INTRAVENOUS
  Filled 2018-01-31: qty 5

## 2018-01-31 MED ORDER — ATROPINE SULFATE 1 MG/ML IJ SOLN
0.5000 mg | Freq: Once | INTRAMUSCULAR | Status: AC | PRN
  Administered 2018-01-31: 0.5 mg via INTRAVENOUS

## 2018-01-31 MED ORDER — SODIUM CHLORIDE 0.9 % IV SOLN
Freq: Once | INTRAVENOUS | Status: AC
  Administered 2018-01-31: 11:00:00 via INTRAVENOUS
  Filled 2018-01-31: qty 250

## 2018-01-31 MED ORDER — ATROPINE SULFATE 1 MG/ML IJ SOLN
INTRAMUSCULAR | Status: AC
  Filled 2018-01-31: qty 1

## 2018-01-31 MED ORDER — PALONOSETRON HCL INJECTION 0.25 MG/5ML
INTRAVENOUS | Status: AC
  Filled 2018-01-31: qty 5

## 2018-01-31 NOTE — Patient Instructions (Signed)
[facility] Discharge Instructions for Patients Receiving Chemotherapy  Today you received the following chemotherapy agents :  Irinotecan, Leucovorin, Fluorouracil.  To help prevent nausea and vomiting after your treatment, we encourage you to take your nausea medication as prescribed.   If you develop nausea and vomiting that is not controlled by your nausea medication, call the clinic.   BELOW ARE SYMPTOMS THAT SHOULD BE REPORTED IMMEDIATELY:  *FEVER GREATER THAN 100.5 F  *CHILLS WITH OR WITHOUT FEVER  NAUSEA AND VOMITING THAT IS NOT CONTROLLED WITH YOUR NAUSEA MEDICATION  *UNUSUAL SHORTNESS OF BREATH  *UNUSUAL BRUISING OR BLEEDING  TENDERNESS IN MOUTH AND THROAT WITH OR WITHOUT PRESENCE OF ULCERS  *URINARY PROBLEMS  *BOWEL PROBLEMS  UNUSUAL RASH Items with * indicate a potential emergency and should be followed up as soon as possible.  Feel free to call the clinic should you have any questions or concerns. The clinic phone number is [phone].  Please show the CHEMO ALERT CARD at check-in to the Emergency Department and triage nurse.   

## 2018-01-31 NOTE — Telephone Encounter (Signed)
Scheduled appt per 8/14 los - gave patient AVS and calender per los.

## 2018-01-31 NOTE — Progress Notes (Addendum)
[facility] OFFICE PROGRESS NOTE   Diagnosis: Rectal cancer  INTERVAL HISTORY:   [name] returns as scheduled.  She completed cycle 10 FOLFIRI 01/18/2018.  She had nausea for the first couple of days.  No vomiting.  No mouth sores.  No change in baseline bowel habits.  She has a good appetite.  She has intermittent "aching" in the pelvic area.  Objective:  Vital signs in last 24 hours:  Blood pressure 129/89, pulse 71, temperature 97.8 F (36.6 C), temperature source Oral, weight 154 lb 9 oz (70.1 kg), SpO2 99 %.    HEENT: No thrush or ulcers. Lymphatics: Small medial left supraclavicular lymph node. Resp: Lungs clear bilaterally. Cardio: Regular rate and rhythm. GI: Abdomen soft and nontender.  No hepatomegaly. Vascular: No leg edema. Port-A-Cath without erythema.  Lab Results:  Lab Results  Component Value Date   WBC 3.4 (L) 01/31/2018   HGB 12.3 01/31/2018   HCT 37.1 01/31/2018   MCV 97.1 01/31/2018   PLT 194 01/31/2018   NEUTROABS 2.4 01/31/2018    Imaging:  Ct Chest W Contrast  Result Date: 01/29/2018 CLINICAL DATA:  Rectal cancer restaging EXAM: CT CHEST, ABDOMEN, AND PELVIS WITH CONTRAST TECHNIQUE: Multidetector CT imaging of the chest, abdomen and pelvis was performed following the standard protocol during bolus administration of intravenous contrast. CONTRAST:  173m OMNIPAQUE IOHEXOL 300 MG/ML  SOLN COMPARISON:  Multiple exams, including 11/14/2016 FINDINGS: CT CHEST FINDINGS Cardiovascular: Right Port-A-Cath tip: Cavoatrial junction. Mediastinum/Nodes: Left supraclavicular node 1.3 cm in short axis on image 3/2 (formerly the same by my measurement) but with increased density along its margin potentially reflecting faint rim calcification. Right paratracheal node 1.2 cm in short axis on image 18/2, formerly the same by my measurement. Lungs/Pleura: Bandlike scarring in both lower lobes. Musculoskeletal: Thoracic spondylosis. CT ABDOMEN PELVIS  FINDINGS Hepatobiliary: Contracted gallbladder. Fatty infiltration adjacent to the falciform ligament. Stable 4 mm hypodense lesion in the right hepatic lobe on image 41/2, likely a tiny cyst. No biliary dilatation. Pancreas: Unremarkable Spleen: Unremarkable Adrenals/Urinary Tract: Left nephrectomy. Adrenal glands normal. Right kidney unremarkable. Stomach/Bowel: Unremarkable Vascular/Lymphatic: Conglomerate periaortic lymph node 1.1 cm in short axis on image 57/2, formerly 1.6 cm. AP window lymph node 1.0 cm in short axis on image 60/2, formerly 1.2 cm. Right common iliac node 1.5 cm in short axis on image 79/2, formerly 1.6 cm by my measurements. Right external iliac node 1.9 cm in short axis on image 93/2, formerly 2.0 cm. Reproductive: IUD in the uterus. Cystic lesion near the cervix suggesting nabothian cyst. Suspected left hydrosalpinx. Other: Stable presacral thickening along with a small amount of presacral gas, suspected fistula from the posterior nasty Moses region extending into the presacral soft tissues. There is also a left sacral nerve stimulator extending into the presacral soft tissue density. Musculoskeletal: Endplate sclerosis and degenerative disc disease at L4-5 and L5-S1. There is left foraminal stenosis at the L5-S1 level. IMPRESSION: 1. Stable thoracic adenopathy with mildly improved abdominal adenopathy compared to the prior exam. There still pathologic adenopathy in both the chest and abdomen. 2. Similar appearance of presacral soft tissue density which is likely therapy related. I suspect that there is a small fistula from the posterior margin of the rectum or anastomosis to the presacral region allowing for a trace amount of gas and contrast along the anterior margin of the presacral soft tissue thickening. 3. Left hydrosalpinx. Electronically Signed   By: WVan ClinesM.D.   On: 01/29/2018 17:03  Ct Abdomen Pelvis W Contrast  Result Date: 01/29/2018 CLINICAL DATA:  Rectal  cancer restaging EXAM: CT CHEST, ABDOMEN, AND PELVIS WITH CONTRAST TECHNIQUE: Multidetector CT imaging of the chest, abdomen and pelvis was performed following the standard protocol during bolus administration of intravenous contrast. CONTRAST:  147m OMNIPAQUE IOHEXOL 300 MG/ML  SOLN COMPARISON:  Multiple exams, including 11/14/2016 FINDINGS: CT CHEST FINDINGS Cardiovascular: Right Port-A-Cath tip: Cavoatrial junction. Mediastinum/Nodes: Left supraclavicular node 1.3 cm in short axis on image 3/2 (formerly the same by my measurement) but with increased density along its margin potentially reflecting faint rim calcification. Right paratracheal node 1.2 cm in short axis on image 18/2, formerly the same by my measurement. Lungs/Pleura: Bandlike scarring in both lower lobes. Musculoskeletal: Thoracic spondylosis. CT ABDOMEN PELVIS FINDINGS Hepatobiliary: Contracted gallbladder. Fatty infiltration adjacent to the falciform ligament. Stable 4 mm hypodense lesion in the right hepatic lobe on image 41/2, likely a tiny cyst. No biliary dilatation. Pancreas: Unremarkable Spleen: Unremarkable Adrenals/Urinary Tract: Left nephrectomy. Adrenal glands normal. Right kidney unremarkable. Stomach/Bowel: Unremarkable Vascular/Lymphatic: Conglomerate periaortic lymph node 1.1 cm in short axis on image 57/2, formerly 1.6 cm. AP window lymph node 1.0 cm in short axis on image 60/2, formerly 1.2 cm. Right common iliac node 1.5 cm in short axis on image 79/2, formerly 1.6 cm by my measurements. Right external iliac node 1.9 cm in short axis on image 93/2, formerly 2.0 cm. Reproductive: IUD in the uterus. Cystic lesion near the cervix suggesting nabothian cyst. Suspected left hydrosalpinx. Other: Stable presacral thickening along with a small amount of presacral gas, suspected fistula from the posterior nasty Moses region extending into the presacral soft tissues. There is also a left sacral nerve stimulator extending into the presacral  soft tissue density. Musculoskeletal: Endplate sclerosis and degenerative disc disease at L4-5 and L5-S1. There is left foraminal stenosis at the L5-S1 level. IMPRESSION: 1. Stable thoracic adenopathy with mildly improved abdominal adenopathy compared to the prior exam. There still pathologic adenopathy in both the chest and abdomen. 2. Similar appearance of presacral soft tissue density which is likely therapy related. I suspect that there is a small fistula from the posterior margin of the rectum or anastomosis to the presacral region allowing for a trace amount of gas and contrast along the anterior margin of the presacral soft tissue thickening. 3. Left hydrosalpinx. Electronically Signed   By: WVan ClinesM.D.   On: 01/29/2018 17:03    Medications: I have reviewed the patient's current medications.  Assessment/Plan: 1. Rectal cancer, clinical stage III (T3 N1) status post biopsy of a rectal mass on 05/24/2013 confirming adenocarcinoma. Endoscopic ultrasound 05/31/2013 measured the mass at 11 cm from the anal verge, uT3uN1.  Initiation of radiation and concurrent Xeloda 06/18/2013; completion 07/26/2013.   Status post low anterior resection 09/02/2013. Invasive adenocarcinoma, 3.7 cm, negative margins; metastatic carcinoma in 7 of 11 lymph nodes; extensive lymph vascular involvement by tumor.MSI-stable, no loss of mismatch repair protein expression  Cycle 1 adjuvant FOLFOX 10/15/2013.   Cycle 2 adjuvant FOLFOX 10/29/2013.   Cycle 3 adjuvant FOLFOX 11/14/2013.   Cycle 4 adjuvant FOLFOX 11/27/2013.   Cycle 5 adjuvant FOLFOX 12/10/2013   Cycle 6 adjuvant FOLFOX 12/24/2013.   Cycle 7 adjuvant FOLFOX 01/14/2014.   Cycle 8 adjuvant FOLFOX 01/28/2014   Cycle 9 adjuvant FOLFOX 02/11/2014 (oxaliplatin dose reduced secondary to thrombocytopenia)   Surveillance CT scans 06/02/2014 with no evidence of recurrent rectal cancer  surveillance CT scans 06/16/2015 with no  evidence of recurrent rectal cancer,  10 mm right external iliac node , increased amount of loculated air associated with the presacral soft tissue thickening , new left hydronephrosis  Surveillance CT scans 05/06/2016-negative for recurrent rectal cancer, decreased right external iliac node, persistent left hydronephrosis  CT abdomen/pelvis 07/05/2017-new left para-aortic, aortocaval, right common iliac and right external iliac lymphadenopathy.  CT neck/chest 07/13/2017-enlarged left supraclavicular nodes, new paratracheal nodes  Ultrasound-guided biopsy of a left supraclavicular lymph node 07/25/2017-metastatic adenocarcinoma consistent with colorectal cancer,MSI-stable, tumor mutation burden-10, K-ras S79mtation  Cycle 1 FOLFIRI 08/28/2017  Cycle 2 FOLFIRI 09/11/2017  Cycle 3 FOLFIRI 09/25/2017  Cycle 4 FOLFIRI 10/09/2017  Cycle 5 FOLFIRI 10/23/2017  CTs 11/14/2017-slight decrease in left supraclavicular adenopathy, slight increase and decrease in mediastinal/retroperitoneal/pelvic nodes, no new disease  Cycle 6 FOLFIRI 11/14/2017  Cycle 7 FOLFIRI 11/27/2017  Cycle 8 FOLFIRI 12/11/2017  Cycle 9 FOLFIRI 01/03/2018  Cycle 10 FOLFIRI 01/18/2018  Restaging CTs 01/29/2018- stable thoracic adenopathy with mildly improved abdominal adenopathy.  Similar appearance of presacral soft tissue density.  Suspected small fistula from the posterior margin of the rectum or anastomosis to the presacral region.  Cycle 11 FOLFIRI 01/31/2018 2. Post colonoscopy bowel perforation confirmed on abdomen CT 05/28/2013. 3. Single indeterminate liver lesion on the abdomen CT 05/28/2013. MRI on 06/18/2013 confirmed 2 tiny liver lesions, indeterminate-favored to be benign cysts. 4. History of cervical and lumbar disc disease.  5. History of HELLP syndrome with first pregnancy.  6. Delayed nausea following cycle 1 FOLFOX. She receives Aloxi and Emend. She continues to experience delayed nausea. Prophylactic Decadron  added with cycle 4. 7. History of pain with bowel movements. 8. Anastomotic leak on CT 09/13/2013. Status post exploratory laparotomy with drainage of a pelvic abscess, diverting loop ileostomy. Drainage catheter removed 10/23/2013.  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 Perineal numbness, urinary retention. The urinary retention has improved. 10. Perineal pain-improved. 11. Neutropenia secondary chemotherapy-she received Neulasta. 12. Urinary tract infection, Escherichia coli-11/03/2013. 13. Venous engorgement at the left anterior chest and left arm with swelling of the left arm and hand. Venous Doppler 11/14/2013 positive for DVT. She began Lovenox 11/14/2013 and was switched to xarelto 12/10/2013. Xarelto discontinued early January 2016. 14. Fever 11/20/2013. Blood and urine cultures negative. The fever resolved within 24 hours. She continued to have a single episode of fever following chemotherapy 15. History of a herpetic lip lesion 16. 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 17. Escherichia coli bacteremia, urinary source, 01/14/2014. She completed a 10 day course of ceftriaxone. 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 Anxiety/depression. 20. Port-A-Cath removal 06/09/2014. 21. Escherichia coli urinary tract infection 06/24/2014. She completed a course of ciprofloxacin. 22. frequent bowel movements following surgery/radiation -status post placement of a sacral stimulator 11/21/2014 23. CT 06/16/2015 with new left hydronephrosis status post cystoscopy with placement of a double-J stent 07/17/2015. No tumors or stones were seen in the bladder. Both ureteral orifices were unremarkable. On the left side retrograde study was performed. The distal ureter was narrowed and then a little bit more dilated proximally with  hydronephrosis noted. There was no evidence of intramural tumor but the area did seem a bit strictured.  Stent removed, persistent hydronephrosis, followed by Dr. EAmalia Hailey CT 05/06/2016-increased left hydronephrosis  Ultrasound 09/09/2016-stable left hydroureteronephrosis  Left nephrectomy 04/28/2017  24.CT abdomen/pelvis 07/05/2017-new left para-aortic, aortocaval, right common iliac and right external iliac lymphadenopathy.   Disposition: Ms. BCianiappears stable.  She  has completed 10 cycles of FOLFIRI.  Recent restaging CT scans show stable disease.  Plan to proceed with cycle 11 FOLFIRI today as scheduled with subsequent interval to be changed to every 3 weeks.  We reviewed the CBC from today.  Counts are adequate for treatment.  She will return for lab, follow-up and cycle 12 FOLFIRI on 02/26/2018.  She will contact the office in the interim with any problems.  Patient seen with Dr. Benay Spice.  CT images reviewed on the computer with Ms. Weismann and her husband.  25 minutes were spent face-to-face at today's visit with the majority of that time involved in counseling/coordination of care.    Ned Card ANP/GNP-BC   01/31/2018  9:55 AM This was a shared visit with Ned Card.  We reviewed the restaging CT images with Ms. Avis and her husband.  The CTs are consistent with stable disease.  We discussed treatment options.  She will continue FOLFIRI on a 3-week schedule.  [name], MD

## 2018-02-02 ENCOUNTER — Inpatient Hospital Stay: Payer: 59

## 2018-02-02 VITALS — BP 115/70 | HR 88 | Temp 98.1°F | Resp 18

## 2018-02-02 DIAGNOSIS — C20 Malignant neoplasm of rectum: Secondary | ICD-10-CM

## 2018-02-02 DIAGNOSIS — Z5111 Encounter for antineoplastic chemotherapy: Secondary | ICD-10-CM | POA: Diagnosis not present

## 2018-02-02 MED ORDER — SODIUM CHLORIDE 0.9% FLUSH
10.0000 mL | INTRAVENOUS | Status: DC | PRN
  Administered 2018-02-02: 10 mL
  Filled 2018-02-02: qty 10

## 2018-02-02 MED ORDER — HEPARIN SOD (PORK) LOCK FLUSH 100 UNIT/ML IV SOLN
500.0000 [IU] | Freq: Once | INTRAVENOUS | Status: AC | PRN
  Administered 2018-02-02: 500 [IU]
  Filled 2018-02-02: qty 5

## 2018-02-12 ENCOUNTER — Telehealth: Payer: Self-pay | Admitting: Nurse Practitioner

## 2018-02-12 ENCOUNTER — Other Ambulatory Visit: Payer: Self-pay | Admitting: Emergency Medicine

## 2018-02-12 MED ORDER — POTASSIUM CHLORIDE ER 10 MEQ PO CPCR: 10.0000 meq | ORAL_CAPSULE | Freq: Two times a day (BID) | ORAL | 0 refills | Status: DC

## 2018-02-12 NOTE — Telephone Encounter (Signed)
Scheduled appt per 8/26 sch message - pt is aware of appt change.

## 2018-02-15 ENCOUNTER — Other Ambulatory Visit: Payer: 59

## 2018-02-15 ENCOUNTER — Ambulatory Visit: Payer: 59

## 2018-02-15 ENCOUNTER — Ambulatory Visit: Payer: 59 | Admitting: Oncology

## 2018-02-18 ENCOUNTER — Other Ambulatory Visit: Payer: Self-pay | Admitting: Oncology

## 2018-02-20 ENCOUNTER — Telehealth: Payer: Self-pay | Admitting: Nurse Practitioner

## 2018-02-20 ENCOUNTER — Other Ambulatory Visit: Payer: Self-pay

## 2018-02-20 ENCOUNTER — Inpatient Hospital Stay: Payer: 59 | Attending: Nurse Practitioner

## 2018-02-20 ENCOUNTER — Inpatient Hospital Stay: Payer: 59

## 2018-02-20 ENCOUNTER — Inpatient Hospital Stay (HOSPITAL_BASED_OUTPATIENT_CLINIC_OR_DEPARTMENT_OTHER): Payer: 59 | Admitting: Nurse Practitioner

## 2018-02-20 ENCOUNTER — Encounter: Payer: Self-pay | Admitting: Nurse Practitioner

## 2018-02-20 VITALS — BP 132/96 | HR 70 | Temp 98.3°F | Resp 18 | Ht 64.0 in | Wt 156.1 lb

## 2018-02-20 DIAGNOSIS — N390 Urinary tract infection, site not specified: Secondary | ICD-10-CM

## 2018-02-20 DIAGNOSIS — Z5111 Encounter for antineoplastic chemotherapy: Secondary | ICD-10-CM | POA: Diagnosis present

## 2018-02-20 DIAGNOSIS — C20 Malignant neoplasm of rectum: Secondary | ICD-10-CM

## 2018-02-20 DIAGNOSIS — B9620 Unspecified Escherichia coli [E. coli] as the cause of diseases classified elsewhere: Secondary | ICD-10-CM | POA: Diagnosis not present

## 2018-02-20 DIAGNOSIS — Z95828 Presence of other vascular implants and grafts: Secondary | ICD-10-CM

## 2018-02-20 DIAGNOSIS — Z452 Encounter for adjustment and management of vascular access device: Secondary | ICD-10-CM | POA: Insufficient documentation

## 2018-02-20 DIAGNOSIS — C770 Secondary and unspecified malignant neoplasm of lymph nodes of head, face and neck: Secondary | ICD-10-CM | POA: Diagnosis not present

## 2018-02-20 DIAGNOSIS — N136 Pyonephrosis: Secondary | ICD-10-CM | POA: Insufficient documentation

## 2018-02-20 DIAGNOSIS — R319 Hematuria, unspecified: Secondary | ICD-10-CM

## 2018-02-20 LAB — CBC WITH DIFFERENTIAL (CANCER CENTER ONLY)
BASOS ABS: 0 10*3/uL (ref 0.0–0.1)
Basophils Relative: 0 %
EOS PCT: 6 %
Eosinophils Absolute: 0.2 10*3/uL (ref 0.0–0.5)
HCT: 37.5 % (ref 34.8–46.6)
Hemoglobin: 12.5 g/dL (ref 11.6–15.9)
LYMPHS PCT: 18 %
Lymphs Abs: 0.5 10*3/uL — ABNORMAL LOW (ref 0.9–3.3)
MCH: 32.5 pg (ref 25.1–34.0)
MCHC: 33.3 g/dL (ref 31.5–36.0)
MCV: 97.4 fL (ref 79.5–101.0)
MONO ABS: 0.5 10*3/uL (ref 0.1–0.9)
Monocytes Relative: 18 %
Neutro Abs: 1.7 10*3/uL (ref 1.5–6.5)
Neutrophils Relative %: 58 %
PLATELETS: 220 10*3/uL (ref 145–400)
RBC: 3.85 MIL/uL (ref 3.70–5.45)
RDW: 13.6 % (ref 11.2–14.5)
WBC Count: 2.9 10*3/uL — ABNORMAL LOW (ref 3.9–10.3)

## 2018-02-20 LAB — CMP (CANCER CENTER ONLY)
ALBUMIN: 3.8 g/dL (ref 3.5–5.0)
ALK PHOS: 68 U/L (ref 38–126)
ALT: 13 U/L (ref 0–44)
AST: 13 U/L — AB (ref 15–41)
Anion gap: 9 (ref 5–15)
BILIRUBIN TOTAL: 0.3 mg/dL (ref 0.3–1.2)
BUN: 14 mg/dL (ref 6–20)
CO2: 25 mmol/L (ref 22–32)
Calcium: 9.5 mg/dL (ref 8.9–10.3)
Chloride: 105 mmol/L (ref 98–111)
Creatinine: 1.07 mg/dL — ABNORMAL HIGH (ref 0.44–1.00)
GFR, Est AFR Am: >60 mL/min (ref >60)
GFR, Estimated: 57 mL/min — ABNORMAL LOW (ref >60)
GLUCOSE: 87 mg/dL (ref 70–99)
POTASSIUM: 4.2 mmol/L (ref 3.5–5.1)
Sodium: 139 mmol/L (ref 135–145)
TOTAL PROTEIN: 7.1 g/dL (ref 6.5–8.1)

## 2018-02-20 LAB — CEA (IN HOUSE-CHCC): CEA (CHCC-IN HOUSE): 9.05 ng/mL — AB (ref 0.00–5.00)

## 2018-02-20 MED ORDER — SODIUM CHLORIDE 0.9 % IV SOLN
Freq: Once | INTRAVENOUS | Status: AC
  Administered 2018-02-20: 14:00:00 via INTRAVENOUS
  Filled 2018-02-20: qty 5

## 2018-02-20 MED ORDER — ATROPINE SULFATE 1 MG/ML IJ SOLN
0.5000 mg | Freq: Once | INTRAMUSCULAR | Status: AC | PRN
  Administered 2018-02-20: 0.5 mg via INTRAVENOUS

## 2018-02-20 MED ORDER — LEUCOVORIN CALCIUM INJECTION 350 MG
400.0000 mg/m2 | Freq: Once | INTRAVENOUS | Status: AC
  Administered 2018-02-20: 712 mg via INTRAVENOUS
  Filled 2018-02-20: qty 35.6

## 2018-02-20 MED ORDER — FLUOROURACIL CHEMO INJECTION 2.5 GM/50ML
400.0000 mg/m2 | Freq: Once | INTRAVENOUS | Status: AC
  Administered 2018-02-20: 700 mg via INTRAVENOUS
  Filled 2018-02-20: qty 14

## 2018-02-20 MED ORDER — PALONOSETRON HCL INJECTION 0.25 MG/5ML
INTRAVENOUS | Status: AC
  Filled 2018-02-20: qty 5

## 2018-02-20 MED ORDER — PANTOPRAZOLE SODIUM 20 MG PO TBEC: 20.0000 mg | DELAYED_RELEASE_TABLET | Freq: Every day | ORAL | 1 refills | Status: DC

## 2018-02-20 MED ORDER — SODIUM CHLORIDE 0.9 % IV SOLN
2400.0000 mg/m2 | INTRAVENOUS | Status: DC
  Administered 2018-02-20: 4250 mg via INTRAVENOUS
  Filled 2018-02-20: qty 85

## 2018-02-20 MED ORDER — PALONOSETRON HCL INJECTION 0.25 MG/5ML
0.2500 mg | Freq: Once | INTRAVENOUS | Status: AC
  Administered 2018-02-20: 0.25 mg via INTRAVENOUS

## 2018-02-20 MED ORDER — SODIUM CHLORIDE 0.9 % IV SOLN
Freq: Once | INTRAVENOUS | Status: AC
  Administered 2018-02-20: 13:00:00 via INTRAVENOUS
  Filled 2018-02-20: qty 250

## 2018-02-20 MED ORDER — IRINOTECAN HCL CHEMO INJECTION 100 MG/5ML
180.0000 mg/m2 | Freq: Once | INTRAVENOUS | Status: AC
  Administered 2018-02-20: 320 mg via INTRAVENOUS
  Filled 2018-02-20: qty 15

## 2018-02-20 MED ORDER — ATROPINE SULFATE 1 MG/ML IJ SOLN
INTRAMUSCULAR | Status: AC
  Filled 2018-02-20: qty 1

## 2018-02-20 MED ORDER — SODIUM CHLORIDE 0.9% FLUSH
10.0000 mL | INTRAVENOUS | Status: DC | PRN
  Administered 2018-02-20: 10 mL
  Filled 2018-02-20: qty 10

## 2018-02-20 NOTE — Progress Notes (Signed)
[name]   Diagnosis: Rectal cancer  INTERVAL HISTORY:   [name] returns as scheduled.  She completed cycle 11 FOLFIRI 01/31/2018.  She had intermittent nausea for 3 or 4 days.  No mouth sores.  She tends to have mild diarrhea after the chemotherapy.  Last week she took some of her husband's Protonix and overall noted improvement in all GI symptoms.  Appetite overall is good.  Objective:  Vital signs in last 24 hours:  Blood pressure (!) 132/96, pulse 70, temperature 98.3 F (36.8 C), temperature source Oral, resp. rate 18, height _0  (1.626 m), weight 156 lb 1.6 oz (70.8 kg), SpO2 98 %.    HEENT: No thrush or ulcers. Resp: Lungs clear bilaterally. Cardio: Regular rate and rhythm. GI: Abdomen soft and nontender.  No hepatomegaly. Vascular: No leg edema. Skin: Palms without erythema. Port-A-Cath without erythema.   Lab Results:  Lab Results  Component Value Date   WBC 3.4 (L) 01/31/2018   HGB 12.3 01/31/2018   HCT 37.1 01/31/2018   MCV 97.1 01/31/2018   PLT 194 01/31/2018   NEUTROABS 2.4 01/31/2018    Imaging:  No results found.  Medications: I have reviewed the patient's current medications.  Assessment/Plan: 1. Rectal cancer, clinical stage III (T3 N1) status post biopsy of a rectal mass on 05/24/2013 confirming adenocarcinoma. Endoscopic ultrasound 05/31/2013 measured the mass at 11 cm from the anal verge, uT3uN1.  Initiation of radiation and concurrent Xeloda 06/18/2013; completion 07/26/2013.   Status post low anterior resection 09/02/2013. Invasive adenocarcinoma, 3.7 cm, negative margins; metastatic carcinoma in 7 of 11 lymph nodes; extensive lymph vascular involvement by tumor.MSI-stable, no loss of mismatch repair protein expression  Cycle 1 adjuvant FOLFOX 10/15/2013.   Cycle 2 adjuvant FOLFOX 10/29/2013.   Cycle 3 adjuvant FOLFOX 11/14/2013.   Cycle 4 adjuvant FOLFOX 11/27/2013.   Cycle 5  adjuvant FOLFOX 12/10/2013   Cycle 6 adjuvant FOLFOX 12/24/2013.   Cycle 7 adjuvant FOLFOX 01/14/2014.   Cycle 8 adjuvant FOLFOX 01/28/2014   Cycle 9 adjuvant FOLFOX 02/11/2014 (oxaliplatin dose reduced secondary to thrombocytopenia)   Surveillance CT scans 06/02/2014 with no evidence of recurrent rectal cancer  surveillance CT scans 06/16/2015 with no evidence of recurrent rectal cancer, 10 mm right external iliac node , increased amount of loculated air associated with the presacral soft tissue thickening , new left hydronephrosis  Surveillance CT scans 05/06/2016-negative for recurrent rectal cancer, decreased right external iliac node, persistent left hydronephrosis  CT abdomen/pelvis 07/05/2017-new left para-aortic, aortocaval, right common iliac and right external iliac lymphadenopathy.  CT neck/chest 07/13/2017-enlarged left supraclavicular nodes, new paratracheal nodes  Ultrasound-guided biopsy of a left supraclavicular lymph node 07/25/2017-metastatic adenocarcinoma consistent with colorectal cancer,MSI-stable, tumor mutation burden-10, K-ras S63mtation  Cycle 1 FOLFIRI 08/28/2017  Cycle 2 FOLFIRI 09/11/2017  Cycle 3 FOLFIRI 09/25/2017  Cycle 4 FOLFIRI 10/09/2017  Cycle 5 FOLFIRI 10/23/2017  CTs 11/14/2017-slight decrease in left supraclavicular adenopathy, slight increase and decrease in mediastinal/retroperitoneal/pelvic nodes, no new disease  Cycle 6 FOLFIRI 11/14/2017  Cycle 7 FOLFIRI 11/27/2017  Cycle 8 FOLFIRI 12/11/2017  Cycle 9 FOLFIRI 01/03/2018  Cycle 10 FOLFIRI 01/18/2018  Restaging CTs 01/29/2018- stable thoracic adenopathy with mildly improved abdominal adenopathy.  Similar appearance of presacral soft tissue density.  Suspected small fistula from the posterior margin of the rectum or anastomosis to the presacral region.  Cycle 11 FOLFIRI 01/31/2018  Cycle 12 FOLFIRI 02/20/2018 2. Post colonoscopy bowel perforation confirmed on abdomen CT  05/28/2013. 3. Single  indeterminate liver lesion on the abdomen CT 05/28/2013. MRI on 06/18/2013 confirmed 2 tiny liver lesions, indeterminate-favored to be benign cysts. 4. History of cervical and lumbar disc disease.  5. History of HELLP syndrome with first pregnancy.  6. Delayed nausea following cycle 1 FOLFOX. She receives Aloxi and Emend. She continues to experience delayed nausea. Prophylactic Decadron added with cycle 4. 7. History of pain with bowel movements. 8. Anastomotic leak on CT 09/13/2013. Status post exploratory laparotomy with drainage of a pelvic abscess, diverting loop ileostomy. Drainage catheter removed 10/23/2013.  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 Perineal numbness, urinary retention. The urinary retention has improved. 10. Perineal pain-improved. 11. Neutropenia secondary chemotherapy-she received Neulasta. 12. Urinary tract infection, Escherichia coli-11/03/2013. 13. Venous engorgement at the left anterior chest and left arm with swelling of the left arm and hand. Venous Doppler 11/14/2013 positive for DVT. She began Lovenox 11/14/2013 and was switched to xarelto 12/10/2013. Xarelto discontinued early January 2016. 14. Fever 11/20/2013. Blood and urine cultures negative. The fever resolved within 24 hours. She continued to have a single episode of fever following chemotherapy 15. History of a herpetic lip lesion 16. 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 17. Escherichia coli bacteremia, urinary source, 01/14/2014. She completed a 10 day course of ceftriaxone. 97. 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 Anxiety/depression. 20. Port-A-Cath removal 06/09/2014. 21. Escherichia coli urinary tract infection 06/24/2014. She completed a course of ciprofloxacin. 22. frequent bowel movements following  surgery/radiation -status post placement of a sacral stimulator 11/21/2014 23. CT 06/16/2015 with new left hydronephrosis status post cystoscopy with placement of a double-J stent 07/17/2015. No tumors or stones were seen in the bladder. Both ureteral orifices were unremarkable. On the left side retrograde study was performed. The distal ureter was narrowed and then a little bit more dilated proximally with hydronephrosis noted. There was no evidence of intramural tumor but the area did seem a bit strictured.  Stent removed, persistent hydronephrosis, followed by Dr. Amalia Hailey  CT 05/06/2016-increased left hydronephrosis  Ultrasound 09/09/2016-stable left hydroureteronephrosis  Left nephrectomy 04/28/2017  24.CT abdomen/pelvis 07/05/2017-new left para-aortic, aortocaval, right common iliac and right external iliac lymphadenopathy.  Disposition: [name] appears stable.  She has completed 11 cycles of FOLFIRI.  Plan to proceed with cycle 12 today as scheduled pending lab results.  She noted overall improvement in GI symptoms after trying Protonix last week.  I sent a prescription to her pharmacy for Protonix 20 mg daily.  She will return for lab, follow-up and cycle 13 FOLFIRI in 3 weeks.  She will contact the office in the interim with any problems.  Plan reviewed with Dr. Benay Spice.    Ned Card ANP/GNP-BC   02/20/2018  11:22 AM

## 2018-02-20 NOTE — Telephone Encounter (Signed)
Scheduled appt per 9/3 los -gave patient AVS and calender per los.  

## 2018-02-20 NOTE — Patient Instructions (Signed)
[facility] Discharge Instructions for Patients Receiving Chemotherapy  Today you received the following chemotherapy agents Irinotecan, Leucovorin, Adrucil  To help prevent nausea and vomiting after your treatment, we encourage you to take your nausea medication as directed.   If you develop nausea and vomiting that is not controlled by your nausea medication, call the clinic.   BELOW ARE SYMPTOMS THAT SHOULD BE REPORTED IMMEDIATELY:  *FEVER GREATER THAN 100.5 F  *CHILLS WITH OR WITHOUT FEVER  NAUSEA AND VOMITING THAT IS NOT CONTROLLED WITH YOUR NAUSEA MEDICATION  *UNUSUAL SHORTNESS OF BREATH  *UNUSUAL BRUISING OR BLEEDING  TENDERNESS IN MOUTH AND THROAT WITH OR WITHOUT PRESENCE OF ULCERS  *URINARY PROBLEMS  *BOWEL PROBLEMS  UNUSUAL RASH Items with * indicate a potential emergency and should be followed up as soon as possible.  Feel free to call the clinic should you have any questions or concerns. The clinic phone number is [phone].  Please show the CHEMO ALERT CARD at check-in to the Emergency Department and triage nurse.   

## 2018-02-21 ENCOUNTER — Other Ambulatory Visit: Payer: Self-pay | Admitting: Nurse Practitioner

## 2018-02-21 DIAGNOSIS — C20 Malignant neoplasm of rectum: Secondary | ICD-10-CM

## 2018-02-22 ENCOUNTER — Inpatient Hospital Stay: Payer: 59

## 2018-02-22 VITALS — BP 95/57 | HR 103 | Temp 98.1°F | Resp 18

## 2018-02-22 DIAGNOSIS — C20 Malignant neoplasm of rectum: Secondary | ICD-10-CM

## 2018-02-22 DIAGNOSIS — Z5111 Encounter for antineoplastic chemotherapy: Secondary | ICD-10-CM | POA: Diagnosis not present

## 2018-02-22 MED ORDER — HEPARIN SOD (PORK) LOCK FLUSH 100 UNIT/ML IV SOLN
500.0000 [IU] | Freq: Once | INTRAVENOUS | Status: AC | PRN
  Administered 2018-02-22: 500 [IU]
  Filled 2018-02-22: qty 5

## 2018-02-22 MED ORDER — SODIUM CHLORIDE 0.9% FLUSH
10.0000 mL | INTRAVENOUS | Status: DC | PRN
  Administered 2018-02-22: 10 mL
  Filled 2018-02-22: qty 10

## 2018-02-26 ENCOUNTER — Other Ambulatory Visit: Payer: 59

## 2018-02-26 ENCOUNTER — Ambulatory Visit: Payer: 59 | Admitting: Nurse Practitioner

## 2018-02-26 ENCOUNTER — Ambulatory Visit: Payer: 59

## 2018-02-27 ENCOUNTER — Ambulatory Visit: Payer: 59 | Admitting: Nurse Practitioner

## 2018-02-27 ENCOUNTER — Ambulatory Visit: Payer: 59 | Admitting: Oncology

## 2018-02-27 ENCOUNTER — Ambulatory Visit: Payer: 59

## 2018-02-27 ENCOUNTER — Other Ambulatory Visit: Payer: 59

## 2018-03-01 NOTE — Progress Notes (Signed)
FMLA successfully faxed to [employer] Attn: Synthia Innocent, at [phone]. Mailed copy to patient address on file.

## 2018-03-05 ENCOUNTER — Telehealth: Payer: Self-pay | Admitting: *Deleted

## 2018-03-05 NOTE — Telephone Encounter (Signed)
Dr. Benay Spice advised and agrees with plan to go to nearest ED.

## 2018-03-05 NOTE — Telephone Encounter (Signed)
TC from patient. She states that she is currently in [place] and experienced right leg swelling from foot to thigh over the weekend. She states that she does not have pain, but that her leg is reddish in color when compared to her left leg. She states she is concerned about a DVT. She has had one in the past in her arm. She states she took some aspirin and one of her husband's Xareltos. Instructed pt to go to Urgent or ED today to be evaluated for DVT, Cellulitis etc. Also instructed her to NOT take her husband's medication as she does not have a diagnosis at this time and Xarelto or another medication would need to be prescribed by an MD IF she is diagnosed with DVT. Advised to keep her leg elevated higher than her heart as much as possible.She voiced understanding of above instructions and will go to Urgent care of ED today.  She will be back to [facility] on Sunday and advised to make sure she stops to walk around every 1-2 hours on the way back and to do some range of motion exercises with her legs while in the car. She has an appt to see Dr. Benay Spice next week.

## 2018-03-07 ENCOUNTER — Other Ambulatory Visit: Payer: Self-pay | Admitting: Oncology

## 2018-03-12 ENCOUNTER — Inpatient Hospital Stay: Payer: 59

## 2018-03-12 ENCOUNTER — Encounter: Payer: Self-pay | Admitting: Nurse Practitioner

## 2018-03-12 ENCOUNTER — Ambulatory Visit (HOSPITAL_COMMUNITY)
Admission: RE | Admit: 2018-03-12 | Discharge: 2018-03-12 | Disposition: A | Discharge status: Home / Self Care | Payer: 59 | Source: Ambulatory Visit | Attending: Nurse Practitioner | Admitting: Nurse Practitioner

## 2018-03-12 ENCOUNTER — Telehealth: Payer: Self-pay | Admitting: Emergency Medicine

## 2018-03-12 ENCOUNTER — Inpatient Hospital Stay (HOSPITAL_BASED_OUTPATIENT_CLINIC_OR_DEPARTMENT_OTHER): Payer: 59 | Admitting: Nurse Practitioner

## 2018-03-12 VITALS — BP 128/87 | HR 69 | Temp 98.1°F | Resp 17 | Ht 64.0 in | Wt 158.7 lb

## 2018-03-12 DIAGNOSIS — C770 Secondary and unspecified malignant neoplasm of lymph nodes of head, face and neck: Secondary | ICD-10-CM | POA: Diagnosis not present

## 2018-03-12 DIAGNOSIS — N390 Urinary tract infection, site not specified: Secondary | ICD-10-CM

## 2018-03-12 DIAGNOSIS — Z5111 Encounter for antineoplastic chemotherapy: Secondary | ICD-10-CM | POA: Diagnosis not present

## 2018-03-12 DIAGNOSIS — C20 Malignant neoplasm of rectum: Secondary | ICD-10-CM | POA: Diagnosis not present

## 2018-03-12 DIAGNOSIS — Z95828 Presence of other vascular implants and grafts: Secondary | ICD-10-CM

## 2018-03-12 DIAGNOSIS — N136 Pyonephrosis: Secondary | ICD-10-CM

## 2018-03-12 DIAGNOSIS — B9620 Unspecified Escherichia coli [E. coli] as the cause of diseases classified elsewhere: Secondary | ICD-10-CM | POA: Diagnosis not present

## 2018-03-12 DIAGNOSIS — R319 Hematuria, unspecified: Secondary | ICD-10-CM

## 2018-03-12 LAB — CBC WITH DIFFERENTIAL (CANCER CENTER ONLY)
BASOS ABS: 0 10*3/uL (ref 0.0–0.1)
Basophils Relative: 0 %
Eosinophils Absolute: 0.2 10*3/uL (ref 0.0–0.5)
Eosinophils Relative: 6 %
HCT: 35.9 % (ref 34.8–46.6)
Hemoglobin: 11.9 g/dL (ref 11.6–15.9)
Lymphocytes Relative: 21 %
Lymphs Abs: 0.6 10*3/uL — ABNORMAL LOW (ref 0.9–3.3)
MCH: 32.6 pg (ref 25.1–34.0)
MCHC: 33.1 g/dL (ref 31.5–36.0)
MCV: 98.4 fL (ref 79.5–101.0)
MONO ABS: 0.3 10*3/uL (ref 0.1–0.9)
Monocytes Relative: 12 %
NEUTROS ABS: 1.6 10*3/uL (ref 1.5–6.5)
Neutrophils Relative %: 61 %
Platelet Count: 220 10*3/uL (ref 145–400)
RBC: 3.65 MIL/uL — AB (ref 3.70–5.45)
RDW: 13.5 % (ref 11.2–14.5)
WBC Count: 2.7 10*3/uL — ABNORMAL LOW (ref 3.9–10.3)

## 2018-03-12 LAB — CMP (CANCER CENTER ONLY)
ALK PHOS: 73 U/L (ref 38–126)
ALT: 11 U/L (ref 0–44)
ANION GAP: 9 (ref 5–15)
AST: 14 U/L — ABNORMAL LOW (ref 15–41)
Albumin: 3.9 g/dL (ref 3.5–5.0)
BUN: 17 mg/dL (ref 6–20)
CALCIUM: 9.3 mg/dL (ref 8.9–10.3)
CO2: 25 mmol/L (ref 22–32)
Chloride: 107 mmol/L (ref 98–111)
Creatinine: 1.02 mg/dL — ABNORMAL HIGH (ref 0.44–1.00)
GLUCOSE: 84 mg/dL (ref 70–99)
Potassium: 4 mmol/L (ref 3.5–5.1)
Sodium: 141 mmol/L (ref 135–145)
TOTAL PROTEIN: 7.1 g/dL (ref 6.5–8.1)

## 2018-03-12 LAB — URINALYSIS, COMPLETE (UACMP) WITH MICROSCOPIC
BILIRUBIN URINE: NEGATIVE
Bacteria, UA: NONE SEEN
GLUCOSE, UA: NEGATIVE mg/dL
Hgb urine dipstick: NEGATIVE
Ketones, ur: NEGATIVE mg/dL
NITRITE: NEGATIVE
PH: 6 (ref 5.0–8.0)
Protein, ur: NEGATIVE mg/dL
Specific Gravity, Urine: 1.01 (ref 1.005–1.030)

## 2018-03-12 LAB — CEA (IN HOUSE-CHCC): CEA (CHCC-IN HOUSE): 9.32 ng/mL — AB (ref 0.00–5.00)

## 2018-03-12 MED ORDER — HEPARIN SOD (PORK) LOCK FLUSH 100 UNIT/ML IV SOLN
500.0000 [IU] | Freq: Once | INTRAVENOUS | Status: AC | PRN
  Administered 2018-03-12: 500 [IU]
  Filled 2018-03-12: qty 5

## 2018-03-12 MED ORDER — ALPRAZOLAM 1 MG PO TABS: 1.0000 mg | ORAL_TABLET | Freq: Every evening | ORAL | 0 refills | Status: DC | PRN

## 2018-03-12 MED ORDER — SODIUM CHLORIDE 0.9% FLUSH
10.0000 mL | INTRAVENOUS | Status: DC | PRN
  Administered 2018-03-12: 10 mL
  Filled 2018-03-12: qty 10

## 2018-03-12 MED ORDER — NITROFURANTOIN MONOHYD MACRO 100 MG PO CAPS: 100.0000 mg | ORAL_CAPSULE | Freq: Two times a day (BID) | ORAL | 0 refills | Status: DC

## 2018-03-12 NOTE — Progress Notes (Signed)
[place] OFFICE PROGRESS NOTE   Diagnosis: Rectal cancer  INTERVAL HISTORY:   [name] returns as scheduled.  She completed cycle 12 FOLFIRI 02/20/2018.  She had intermittent nausea for 6 or 7 days.  No mouth sores.  No diarrhea.  She notes "sensitivity" with urination.  She wonders if she has a urinary tract infection.  She continues to have periodic abdominal pain.  Last week while she was in [place] she developed sudden onset of right leg edema.  She reports going to the emergency department and having a negative venous Doppler.  The leg continues to be swollen.  No associated pain.  No shortness of breath or chest pain.  Objective:  Vital signs in last 24 hours:  Blood pressure 128/87, pulse 69, temperature 98.1 F (36.7 C), temperature source Oral, resp. rate 17, height 5' 4"  (1.626 m), weight 158 lb 11.2 oz (72 kg), SpO2 100 %.    HEENT: No thrush or ulcers. Resp: Lungs clear bilaterally. Cardio: Regular rate and rhythm. GI: Abdomen soft and nontender.  No hepatomegaly. Vascular: Edema throughout the right leg.  No discoloration. Port-A-Cath without erythema.  Lab Results:  Lab Results  Component Value Date   WBC 2.7 (L) 03/12/2018   HGB 11.9 03/12/2018   HCT 35.9 03/12/2018   MCV 98.4 03/12/2018   PLT 220 03/12/2018   NEUTROABS 1.6 03/12/2018    Imaging:  No results found.  Medications: I have reviewed the patient's current medications.  Assessment/Plan: 1. Rectal cancer, clinical stage III (T3 N1) status post biopsy of a rectal mass on 05/24/2013 confirming adenocarcinoma. Endoscopic ultrasound 05/31/2013 measured the mass at 11 cm from the anal verge, uT3uN1.  Initiation of radiation and concurrent Xeloda 06/18/2013; completion 07/26/2013.   Status post low anterior resection 09/02/2013. Invasive adenocarcinoma, 3.7 cm, negative margins; metastatic carcinoma in 7 of 11 lymph nodes; extensive lymph vascular involvement by  tumor.MSI-stable, no loss of mismatch repair protein expression  Cycle 1 adjuvant FOLFOX 10/15/2013.   Cycle 2 adjuvant FOLFOX 10/29/2013.   Cycle 3 adjuvant FOLFOX 11/14/2013.   Cycle 4 adjuvant FOLFOX 11/27/2013.   Cycle 5 adjuvant FOLFOX 12/10/2013   Cycle 6 adjuvant FOLFOX 12/24/2013.   Cycle 7 adjuvant FOLFOX 01/14/2014.   Cycle 8 adjuvant FOLFOX 01/28/2014   Cycle 9 adjuvant FOLFOX 02/11/2014 (oxaliplatin dose reduced secondary to thrombocytopenia)   Surveillance CT scans 06/02/2014 with no evidence of recurrent rectal cancer  surveillance CT scans 06/16/2015 with no evidence of recurrent rectal cancer, 10 mm right external iliac node , increased amount of loculated air associated with the presacral soft tissue thickening , new left hydronephrosis  Surveillance CT scans 05/06/2016-negative for recurrent rectal cancer, decreased right external iliac node, persistent left hydronephrosis  CT abdomen/pelvis 07/05/2017-new left para-aortic, aortocaval, right common iliac and right external iliac lymphadenopathy.  CT neck/chest 07/13/2017-enlarged left supraclavicular nodes, new paratracheal nodes  Ultrasound-guided biopsy of a left supraclavicular lymph node 07/25/2017-metastatic adenocarcinoma consistent with colorectal cancer,MSI-stable, tumor mutation burden-10, K-ras S22mtation  Cycle 1 FOLFIRI 08/28/2017  Cycle 2 FOLFIRI 09/11/2017  Cycle 3 FOLFIRI 09/25/2017  Cycle 4 FOLFIRI 10/09/2017  Cycle 5 FOLFIRI 10/23/2017  CTs 11/14/2017-slight decrease in left supraclavicular adenopathy, slight increase and decrease in mediastinal/retroperitoneal/pelvic nodes, no new disease  Cycle 6 FOLFIRI 11/14/2017  Cycle 7 FOLFIRI 11/27/2017  Cycle 8 FOLFIRI 12/11/2017  Cycle 9 FOLFIRI 01/03/2018  Cycle 10 FOLFIRI 01/18/2018  Restaging CTs 01/29/2018- stable thoracic adenopathywith mildly improved abdominal adenopathy. Similar appearance of presacral soft tissue density.  Suspected small fistula from the posterior margin of the rectum or anastomosis to the presacral region.  Cycle 11 FOLFIRI 01/31/2018  Cycle 12 FOLFIRI 02/20/2018 2. Post colonoscopy bowel perforation confirmed on abdomen CT 05/28/2013. 3. Single indeterminate liver lesion on the abdomen CT 05/28/2013. MRI on 06/18/2013 confirmed 2 tiny liver lesions, indeterminate-favored to be benign cysts. 4. History of cervical and lumbar disc disease.  5. History of HELLP syndrome with first pregnancy.  6. Delayed nausea following cycle 1 FOLFOX. She receives Aloxi and Emend. She continues to experience delayed nausea. Prophylactic Decadron added with cycle 4. 7. History of pain with bowel movements. 8. Anastomotic leak on CT 09/13/2013. Status post exploratory laparotomy with drainage of a pelvic abscess, diverting loop ileostomy. Drainage catheter removed 10/23/2013.  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 Perineal numbness, urinary retention. The urinary retention has improved. 10. Perineal pain-improved. 11. Neutropenia secondary chemotherapy-she received Neulasta. 12. Urinary tract infection, Escherichia coli-11/03/2013. 13. Venous engorgement at the left anterior chest and left arm with swelling of the left arm and hand. Venous Doppler 11/14/2013 positive for DVT. She began Lovenox 11/14/2013 and was switched to xarelto 12/10/2013. Xarelto discontinued early January 2016. 14. Fever 11/20/2013. Blood and urine cultures negative. The fever resolved within 24 hours. She continued to have a single episode of fever following chemotherapy 15. History of a herpetic lip lesion 16. 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 17. Escherichia coli bacteremia, urinary source, 01/14/2014. She completed a 10 day course of ceftriaxone. 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 Anxiety/depression. 20. Port-A-Cath removal 06/09/2014. 21. Escherichia coli urinary tract infection 06/24/2014. She completed a course of ciprofloxacin. 22. frequent bowel movements following surgery/radiation -status post placement of a sacral stimulator 11/21/2014 23. CT 06/16/2015 with new left hydronephrosis status post cystoscopy with placement of a double-J stent 07/17/2015. No tumors or stones were seen in the bladder. Both ureteral orifices were unremarkable. On the left side retrograde study was performed. The distal ureter was narrowed and then a little bit more dilated proximally with hydronephrosis noted. There was no evidence of intramural tumor but the area did seem a bit strictured.  Stent removed, persistent hydronephrosis, followed by Dr. Amalia Hailey  CT 05/06/2016-increased left hydronephrosis  Ultrasound 09/09/2016-stable left hydroureteronephrosis  Left nephrectomy 04/28/2017  24.CT abdomen/pelvis 07/05/2017-new left para-aortic, aortocaval, right common iliac and right external iliac lymphadenopathy.   Disposition: Ms. Coultas appears unchanged.  She has completed 12 cycles of FOLFIRI.  Plan for cycle 13 tomorrow.  Last week while out of town she developed edema throughout the right leg.  She reports a negative venous Doppler at an emergency department in [place].  She has multiple risk factors for DVT.  We are referring her for a repeat venous Doppler today.  Plan for anticoagulation as indicated.  Urinalysis is consistent with a urinary tract infection.  She would like to take the antibiotic Dr. Amalia Hailey has prescribed to her in the past.  We will contact his office.  She will return for cycle 13 FOLFIRI 03/12/2013.  She will return for lab, follow-up and chemotherapy in 3 weeks.  She will contact the office in the interim with further problems.    Ned Card ANP/GNP-BC   03/12/2018  2:50 PM

## 2018-03-12 NOTE — Progress Notes (Signed)
RLE venous duplex prelim: negative for DVT.  Landry Mellow, RDMS, RVT  Called results to [facility]

## 2018-03-12 NOTE — Telephone Encounter (Signed)
VM left w/ Dr.Evans (Urologist) on the offices triage line. Requested they call back with recommended UTI antibiotic for this patient per Ned Card NP. Will follow up

## 2018-03-13 ENCOUNTER — Inpatient Hospital Stay: Payer: 59

## 2018-03-13 ENCOUNTER — Other Ambulatory Visit: Payer: Self-pay | Admitting: Nurse Practitioner

## 2018-03-13 ENCOUNTER — Telehealth: Payer: Self-pay | Admitting: *Deleted

## 2018-03-13 ENCOUNTER — Other Ambulatory Visit: Payer: Self-pay | Admitting: *Deleted

## 2018-03-13 VITALS — BP 131/87 | HR 75 | Temp 97.9°F | Resp 17

## 2018-03-13 DIAGNOSIS — C20 Malignant neoplasm of rectum: Secondary | ICD-10-CM

## 2018-03-13 DIAGNOSIS — Z5111 Encounter for antineoplastic chemotherapy: Secondary | ICD-10-CM | POA: Diagnosis not present

## 2018-03-13 DIAGNOSIS — N390 Urinary tract infection, site not specified: Secondary | ICD-10-CM

## 2018-03-13 LAB — URINE CULTURE

## 2018-03-13 MED ORDER — PALONOSETRON HCL INJECTION 0.25 MG/5ML
INTRAVENOUS | Status: AC
  Filled 2018-03-13: qty 5

## 2018-03-13 MED ORDER — ATROPINE SULFATE 1 MG/ML IJ SOLN
INTRAMUSCULAR | Status: AC
  Filled 2018-03-13: qty 1

## 2018-03-13 MED ORDER — IRINOTECAN HCL CHEMO INJECTION 100 MG/5ML
180.0000 mg/m2 | Freq: Once | INTRAVENOUS | Status: AC
  Administered 2018-03-13: 320 mg via INTRAVENOUS
  Filled 2018-03-13: qty 15

## 2018-03-13 MED ORDER — SODIUM CHLORIDE 0.9 % IV SOLN
Freq: Once | INTRAVENOUS | Status: AC
  Administered 2018-03-13: 09:00:00 via INTRAVENOUS
  Filled 2018-03-13: qty 5

## 2018-03-13 MED ORDER — LEUCOVORIN CALCIUM INJECTION 350 MG
400.0000 mg/m2 | Freq: Once | INTRAVENOUS | Status: AC
  Administered 2018-03-13: 712 mg via INTRAVENOUS
  Filled 2018-03-13: qty 35.6

## 2018-03-13 MED ORDER — SODIUM CHLORIDE 0.9 % IV SOLN
2400.0000 mg/m2 | INTRAVENOUS | Status: DC
  Administered 2018-03-13: 4250 mg via INTRAVENOUS
  Filled 2018-03-13: qty 85

## 2018-03-13 MED ORDER — HYDROCODONE-ACETAMINOPHEN 5-325 MG PO TABS: 1.0000 | ORAL_TABLET | Freq: Two times a day (BID) | ORAL | 0 refills | Status: DC | PRN

## 2018-03-13 MED ORDER — PALONOSETRON HCL INJECTION 0.25 MG/5ML
0.2500 mg | Freq: Once | INTRAVENOUS | Status: AC
  Administered 2018-03-13: 0.25 mg via INTRAVENOUS

## 2018-03-13 MED ORDER — ATROPINE SULFATE 1 MG/ML IJ SOLN: 0.5000 mg | Freq: Once | INTRAMUSCULAR | Status: DC | PRN

## 2018-03-13 MED ORDER — SODIUM CHLORIDE 0.9 % IV SOLN
Freq: Once | INTRAVENOUS | Status: AC
  Administered 2018-03-13: 08:00:00 via INTRAVENOUS
  Filled 2018-03-13: qty 250

## 2018-03-13 MED ORDER — FLUOROURACIL CHEMO INJECTION 2.5 GM/50ML
400.0000 mg/m2 | Freq: Once | INTRAVENOUS | Status: AC
  Administered 2018-03-13: 700 mg via INTRAVENOUS
  Filled 2018-03-13: qty 14

## 2018-03-13 MED ORDER — PROCHLORPERAZINE MALEATE 10 MG PO TABS
10.0000 mg | ORAL_TABLET | Freq: Once | ORAL | Status: AC
  Administered 2018-03-13: 10 mg via ORAL

## 2018-03-13 MED ORDER — AMOXICILLIN 500 MG PO CAPS: 500.0000 mg | ORAL_CAPSULE | Freq: Three times a day (TID) | ORAL | 0 refills | Status: DC

## 2018-03-13 MED ORDER — PROCHLORPERAZINE MALEATE 10 MG PO TABS
ORAL_TABLET | ORAL | Status: AC
  Filled 2018-03-13: qty 1

## 2018-03-13 NOTE — Progress Notes (Signed)
I notified [name] I was changing antibiotic from macrobid to amoxicillin. I sent the prescription to her preferred pharmacy.

## 2018-03-13 NOTE — Patient Instructions (Signed)
[facility] Discharge Instructions for Patients Receiving Chemotherapy  Today you received the following chemotherapy agents Irinotecan, Leucovorin, Adrucil  To help prevent nausea and vomiting after your treatment, we encourage you to take your nausea medication as directed.   If you develop nausea and vomiting that is not controlled by your nausea medication, call the clinic.   BELOW ARE SYMPTOMS THAT SHOULD BE REPORTED IMMEDIATELY:  *FEVER GREATER THAN 100.5 F  *CHILLS WITH OR WITHOUT FEVER  NAUSEA AND VOMITING THAT IS NOT CONTROLLED WITH YOUR NAUSEA MEDICATION  *UNUSUAL SHORTNESS OF BREATH  *UNUSUAL BRUISING OR BLEEDING  TENDERNESS IN MOUTH AND THROAT WITH OR WITHOUT PRESENCE OF ULCERS  *URINARY PROBLEMS  *BOWEL PROBLEMS  UNUSUAL RASH Items with * indicate a potential emergency and should be followed up as soon as possible.  Feel free to call the clinic should you have any questions or concerns. The clinic phone number is [phone].  Please show the CHEMO ALERT CARD at check-in to the Emergency Department and triage nurse.   

## 2018-03-13 NOTE — Telephone Encounter (Signed)
Notified of message below

## 2018-03-13 NOTE — Telephone Encounter (Signed)
-----   Message from Owens Shark, NP sent at 03/13/2018  8:26 AM EDT ----- Please let her know I am referring her for a pelvic CT scan due to unexplained right leg edema.

## 2018-03-15 ENCOUNTER — Inpatient Hospital Stay: Payer: 59

## 2018-03-15 VITALS — BP 103/62 | HR 91 | Temp 98.0°F

## 2018-03-15 DIAGNOSIS — Z5111 Encounter for antineoplastic chemotherapy: Secondary | ICD-10-CM | POA: Diagnosis not present

## 2018-03-15 DIAGNOSIS — C20 Malignant neoplasm of rectum: Secondary | ICD-10-CM

## 2018-03-15 MED ORDER — HEPARIN SOD (PORK) LOCK FLUSH 100 UNIT/ML IV SOLN
500.0000 [IU] | Freq: Once | INTRAVENOUS | Status: AC | PRN
  Administered 2018-03-15: 500 [IU]
  Filled 2018-03-15: qty 5

## 2018-03-15 MED ORDER — SODIUM CHLORIDE 0.9% FLUSH
10.0000 mL | INTRAVENOUS | Status: DC | PRN
  Administered 2018-03-15: 10 mL
  Filled 2018-03-15: qty 10

## 2018-03-16 ENCOUNTER — Ambulatory Visit (HOSPITAL_COMMUNITY)
Admission: RE | Admit: 2018-03-16 | Discharge: 2018-03-16 | Disposition: A | Discharge status: Home / Self Care | Payer: 59 | Source: Ambulatory Visit | Attending: Nurse Practitioner | Admitting: Nurse Practitioner

## 2018-03-16 DIAGNOSIS — C20 Malignant neoplasm of rectum: Secondary | ICD-10-CM | POA: Insufficient documentation

## 2018-03-16 DIAGNOSIS — R590 Localized enlarged lymph nodes: Secondary | ICD-10-CM | POA: Insufficient documentation

## 2018-03-16 MED ORDER — SODIUM CHLORIDE 0.9 % IJ SOLN
INTRAMUSCULAR | Status: AC
  Filled 2018-03-16: qty 50

## 2018-03-16 MED ORDER — IOHEXOL 300 MG/ML  SOLN
100.0000 mL | Freq: Once | INTRAMUSCULAR | Status: AC | PRN
  Administered 2018-03-16: 100 mL via INTRAVENOUS

## 2018-03-19 ENCOUNTER — Ambulatory Visit: Payer: 59 | Admitting: Oncology

## 2018-03-19 ENCOUNTER — Ambulatory Visit: Payer: 59

## 2018-03-19 ENCOUNTER — Other Ambulatory Visit: Payer: 59

## 2018-03-20 ENCOUNTER — Other Ambulatory Visit: Payer: Self-pay | Admitting: Emergency Medicine

## 2018-03-20 MED ORDER — POTASSIUM CHLORIDE ER 10 MEQ PO CPCR: 10.0000 meq | ORAL_CAPSULE | Freq: Two times a day (BID) | ORAL | 0 refills | Status: DC

## 2018-03-22 ENCOUNTER — Telehealth: Payer: Self-pay | Admitting: Emergency Medicine

## 2018-03-22 ENCOUNTER — Other Ambulatory Visit: Payer: Self-pay | Admitting: Nurse Practitioner

## 2018-03-22 DIAGNOSIS — C20 Malignant neoplasm of rectum: Secondary | ICD-10-CM

## 2018-03-22 MED ORDER — RIVAROXABAN 20 MG PO TABS: 20.0000 mg | ORAL_TABLET | Freq: Every day | ORAL | 2 refills | Status: DC

## 2018-03-22 NOTE — Telephone Encounter (Addendum)
Pt verbalized understanding of this. rx sent to pharmacy.   ----- Message from Owens Shark, NP sent at 03/22/2018  8:47 AM EDT ----- Please let her know the CT shows a node compressing the rt. Iliac vein, likely explains the swelling, recommend she start xerelto as dvt prophylaxis.  Please confirm which pharmacy she uses.

## 2018-03-30 ENCOUNTER — Other Ambulatory Visit: Payer: Self-pay | Admitting: Nurse Practitioner

## 2018-03-30 DIAGNOSIS — C20 Malignant neoplasm of rectum: Secondary | ICD-10-CM

## 2018-04-01 ENCOUNTER — Other Ambulatory Visit: Payer: Self-pay | Admitting: Oncology

## 2018-04-02 ENCOUNTER — Inpatient Hospital Stay: Payer: 59 | Attending: Nurse Practitioner | Admitting: Oncology

## 2018-04-02 ENCOUNTER — Inpatient Hospital Stay: Payer: 59

## 2018-04-02 ENCOUNTER — Other Ambulatory Visit: Payer: Self-pay | Admitting: Emergency Medicine

## 2018-04-02 VITALS — BP 138/86 | HR 75 | Temp 98.5°F | Resp 18 | Ht 64.0 in | Wt 158.9 lb

## 2018-04-02 DIAGNOSIS — N390 Urinary tract infection, site not specified: Secondary | ICD-10-CM

## 2018-04-02 DIAGNOSIS — C770 Secondary and unspecified malignant neoplasm of lymph nodes of head, face and neck: Secondary | ICD-10-CM | POA: Insufficient documentation

## 2018-04-02 DIAGNOSIS — Z95828 Presence of other vascular implants and grafts: Secondary | ICD-10-CM

## 2018-04-02 DIAGNOSIS — R319 Hematuria, unspecified: Principal | ICD-10-CM

## 2018-04-02 DIAGNOSIS — Z452 Encounter for adjustment and management of vascular access device: Secondary | ICD-10-CM | POA: Diagnosis not present

## 2018-04-02 DIAGNOSIS — C20 Malignant neoplasm of rectum: Secondary | ICD-10-CM | POA: Diagnosis not present

## 2018-04-02 LAB — CBC WITH DIFFERENTIAL (CANCER CENTER ONLY)
ABS IMMATURE GRANULOCYTES: 0.01 10*3/uL (ref 0.00–0.07)
BASOS ABS: 0 10*3/uL (ref 0.0–0.1)
Basophils Relative: 1 %
Eosinophils Absolute: 0.2 10*3/uL (ref 0.0–0.5)
Eosinophils Relative: 7 %
HCT: 37.6 % (ref 36.0–46.0)
HEMOGLOBIN: 12.6 g/dL (ref 12.0–15.0)
IMMATURE GRANULOCYTES: 0 %
LYMPHS PCT: 14 %
Lymphs Abs: 0.4 10*3/uL — ABNORMAL LOW (ref 0.7–4.0)
MCH: 32.3 pg (ref 26.0–34.0)
MCHC: 33.5 g/dL (ref 30.0–36.0)
MCV: 96.4 fL (ref 80.0–100.0)
MONO ABS: 0.4 10*3/uL (ref 0.1–1.0)
Monocytes Relative: 13 %
NEUTROS ABS: 2.1 10*3/uL (ref 1.7–7.7)
Neutrophils Relative %: 65 %
Platelet Count: 207 10*3/uL (ref 150–400)
RBC: 3.9 MIL/uL (ref 3.87–5.11)
RDW: 12.9 % (ref 11.5–15.5)
WBC: 3.2 10*3/uL — AB (ref 4.0–10.5)
nRBC: 0 % (ref 0.0–0.2)

## 2018-04-02 LAB — URINALYSIS, COMPLETE (UACMP) WITH MICROSCOPIC
BILIRUBIN URINE: NEGATIVE
Bacteria, UA: NONE SEEN
GLUCOSE, UA: NEGATIVE mg/dL
HGB URINE DIPSTICK: NEGATIVE
KETONES UR: NEGATIVE mg/dL
NITRITE: NEGATIVE
Protein, ur: NEGATIVE mg/dL
Specific Gravity, Urine: 1.008 (ref 1.005–1.030)
pH: 5 (ref 5.0–8.0)

## 2018-04-02 LAB — COMPREHENSIVE METABOLIC PANEL
ALT: 21 U/L (ref 0–44)
AST: 24 U/L (ref 15–41)
Albumin: 4.1 g/dL (ref 3.5–5.0)
Alkaline Phosphatase: 60 U/L (ref 38–126)
Anion gap: 9 (ref 5–15)
BILIRUBIN TOTAL: 0.6 mg/dL (ref 0.3–1.2)
BUN: 15 mg/dL (ref 6–20)
CALCIUM: 9.7 mg/dL (ref 8.9–10.3)
CHLORIDE: 102 mmol/L (ref 98–111)
CO2: 28 mmol/L (ref 22–32)
CREATININE: 1.04 mg/dL — AB (ref 0.44–1.00)
GFR, EST NON AFRICAN AMERICAN: 59 mL/min — AB (ref >60)
Glucose, Bld: 95 mg/dL (ref 70–99)
Potassium: 3.8 mmol/L (ref 3.5–5.1)
Sodium: 139 mmol/L (ref 135–145)
Total Protein: 7.4 g/dL (ref 6.5–8.1)

## 2018-04-02 MED ORDER — SODIUM CHLORIDE 0.9% FLUSH
10.0000 mL | INTRAVENOUS | Status: DC | PRN
  Administered 2018-04-02: 10 mL
  Filled 2018-04-02: qty 10

## 2018-04-02 MED ORDER — HYDROCODONE-ACETAMINOPHEN 5-325 MG PO TABS: 1.0000 | ORAL_TABLET | Freq: Four times a day (QID) | ORAL | 0 refills | Status: DC | PRN

## 2018-04-02 NOTE — Progress Notes (Signed)
[place] OFFICE PROGRESS NOTE   Diagnosis: Rectal cancer  INTERVAL HISTORY:   [name] returns as scheduled.  She completed another cycle of FOLFIRI on 03/13/2018. She was noted to have edema throughout the right leg on 03/12/2018.  A Doppler was negative for deep vein thrombosis.  A CT pelvis on 03/16/2018 revealed progressive iliac adenopathy.  A right obturator node compresses the right iliac vein.  The vein is patent. She was started on Xarelto anticoagulation beginning 03/22/2018.  She has noted increased pain in the right lower abdomen and right posterior pelvis.  Hydrocodone relieves the pain.  She is taking gabapentin at night.  Objective:  Vital signs in last 24 hours:  Blood pressure 138/86, pulse 75, temperature 98.5 F (36.9 C), temperature source Oral, resp. rate 18, height 5' 4"  (1.626 m), weight 158 lb 14.4 oz (72.1 kg), SpO2 99 %.    HEENT: No thrush or ulcers Lymphatics: 2-3 cm lymph node at the medial left supraclavicular fossa Resp: Lungs clear bilaterally Cardio: Giller rate and rhythm GI: No hepatomegaly, no mass, nontender Vascular: Edema throughout the right leg   Portacath/PICC-without erythema  Lab Results:  Lab Results  Component Value Date   WBC 3.2 (L) 04/02/2018   HGB 12.6 04/02/2018   HCT 37.6 04/02/2018   MCV 96.4 04/02/2018   PLT 207 04/02/2018   NEUTROABS 2.1 04/02/2018    CMP  Lab Results  Component Value Date   NA 139 04/02/2018   K 3.8 04/02/2018   CL 102 04/02/2018   CO2 28 04/02/2018   GLUCOSE 95 04/02/2018   BUN 15 04/02/2018   CREATININE 1.04 (H) 04/02/2018   CALCIUM 9.7 04/02/2018   PROT 7.4 04/02/2018   ALBUMIN 4.1 04/02/2018   AST 24 04/02/2018   ALT 21 04/02/2018   ALKPHOS 60 04/02/2018   BILITOT 0.6 04/02/2018   GFRNONAA 59 (L) 04/02/2018   GFRAA >60 04/02/2018    Lab Results  Component Value Date   CEA1 9.32 (H) 03/12/2018     Medications: I have reviewed the patient's current  medications.   Assessment/Plan: 1. Rectal cancer, clinical stage III (T3 N1) status post biopsy of a rectal mass on 05/24/2013 confirming adenocarcinoma. Endoscopic ultrasound 05/31/2013 measured the mass at 11 cm from the anal verge, uT3uN1.  Initiation of radiation and concurrent Xeloda 06/18/2013; completion 07/26/2013.   Status post low anterior resection 09/02/2013. Invasive adenocarcinoma, 3.7 cm, negative margins; metastatic carcinoma in 7 of 11 lymph nodes; extensive lymph vascular involvement by tumor.MSI-stable, no loss of mismatch repair protein expression  Cycle 1 adjuvant FOLFOX 10/15/2013.   Cycle 2 adjuvant FOLFOX 10/29/2013.   Cycle 3 adjuvant FOLFOX 11/14/2013.   Cycle 4 adjuvant FOLFOX 11/27/2013.   Cycle 5 adjuvant FOLFOX 12/10/2013   Cycle 6 adjuvant FOLFOX 12/24/2013.   Cycle 7 adjuvant FOLFOX 01/14/2014.   Cycle 8 adjuvant FOLFOX 01/28/2014   Cycle 9 adjuvant FOLFOX 02/11/2014 (oxaliplatin dose reduced secondary to thrombocytopenia)   Surveillance CT scans 06/02/2014 with no evidence of recurrent rectal cancer  surveillance CT scans 06/16/2015 with no evidence of recurrent rectal cancer, 10 mm right external iliac node , increased amount of loculated air associated with the presacral soft tissue thickening , new left hydronephrosis  Surveillance CT scans 05/06/2016-negative for recurrent rectal cancer, decreased right external iliac node, persistent left hydronephrosis  CT abdomen/pelvis 07/05/2017-new left para-aortic, aortocaval, right common iliac and right external iliac lymphadenopathy.  CT neck/chest 07/13/2017-enlarged left supraclavicular nodes, new paratracheal nodes  Ultrasound-guided  biopsy of a left supraclavicular lymph node 07/25/2017-metastatic adenocarcinoma consistent with colorectal cancer,MSI-stable, tumor mutation burden-10, K-ras S76mtation  Cycle 1 FOLFIRI 08/28/2017  Cycle 2 FOLFIRI 09/11/2017  Cycle 3 FOLFIRI  09/25/2017  Cycle 4 FOLFIRI 10/09/2017  Cycle 5 FOLFIRI 10/23/2017  CTs 11/14/2017-slight decrease in left supraclavicular adenopathy, slight increase and decrease in mediastinal/retroperitoneal/pelvic nodes, no new disease  Cycle 6 FOLFIRI 11/14/2017  Cycle 7 FOLFIRI 11/27/2017  Cycle 8 FOLFIRI 12/11/2017  Cycle 9 FOLFIRI 01/03/2018  Cycle 10 FOLFIRI 01/18/2018  Restaging CTs 01/29/2018-stable thoracic adenopathywith mildly improved abdominal adenopathy. Similar appearance of presacral soft tissue density. Suspected small fistula from the posterior margin of the rectum or anastomosis to the presacral region.  Cycle 11 FOLFIRI 01/31/2018  Cycle 12 FOLFIRI 02/20/2018  CT pelvis 03/16/2018-mild progression of pelvic lymphadenopathy with compression of the right iliac vein by an enlarged right obturator node 2. Post colonoscopy bowel perforation confirmed on abdomen CT 05/28/2013. 3. Single indeterminate liver lesion on the abdomen CT 05/28/2013. MRI on 06/18/2013 confirmed 2 tiny liver lesions, indeterminate-favored to be benign cysts. 4. History of cervical and lumbar disc disease.  5. History of HELLP syndrome with first pregnancy.  6. Delayed nausea following cycle 1 FOLFOX. She receives Aloxi and Emend. She continues to experience delayed nausea. Prophylactic Decadron added with cycle 4. 7. History of pain with bowel movements. 8. Anastomotic leak on CT 09/13/2013. Status post exploratory laparotomy with drainage of a pelvic abscess, diverting loop ileostomy. Drainage catheter removed 10/23/2013.  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 Perineal numbness, urinary retention. The urinary retention has improved. 10. Perineal pain-improved. 11. Neutropenia secondary chemotherapy-she received Neulasta. 12. Urinary tract infection, Escherichia coli-11/03/2013. 13. Venous engorgement  at the left anterior chest and left arm with swelling of the left arm and hand. Venous Doppler 11/14/2013 positive for DVT. She began Lovenox 11/14/2013 and was switched to xarelto 12/10/2013. Xarelto discontinued early January 2016. 14. Fever 11/20/2013. Blood and urine cultures negative. The fever resolved within 24 hours. She continued to have a single episode of fever following chemotherapy 15. History of a herpetic lip lesion 16. 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 17. Escherichia coli bacteremia, urinary source, 01/14/2014. She completed a 10 day course of ceftriaxone. 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 Anxiety/depression. 20. Port-A-Cath removal 06/09/2014. 21. Escherichia coli urinary tract infection 06/24/2014. She completed a course of ciprofloxacin. 22. frequent bowel movements following surgery/radiation -status post placement of a sacral stimulator 11/21/2014 23. CT 06/16/2015 with new left hydronephrosis status post cystoscopy with placement of a double-J stent 07/17/2015. No tumors or stones were seen in the bladder. Both ureteral orifices were unremarkable. On the left side retrograde study was performed. The distal ureter was narrowed and then a little bit more dilated proximally with hydronephrosis noted. There was no evidence of intramural tumor but the area did seem a bit strictured.  Stent removed, persistent hydronephrosis, followed by Dr. EAmalia Hailey CT 05/06/2016-increased left hydronephrosis  Ultrasound 09/09/2016-stable left hydroureteronephrosis  Left nephrectomy 04/28/2017  24.  Compression of right iliac vein on CT 03/16/2018-placed on Xarelto anticoagulation     Disposition: Ms. BIsmaelhas metastatic rectal cancer.  She has been maintained on FOLFIRI chemotherapy since March of this year.  There is clinical evidence of disease progression.  She has pain, swelling of the right leg, and enlarged  left supraclavicular lymph node, and the CEA is higher.  I  reviewed the 03/16/2018 CT images with her.  We decided to discontinue FOLFIRI.  We discussed treatment options including repeat treatment with FOLFOX, TAS-102, regorafenib, and trials.  I will refer her back to Dr. Truman Hayward to get his opinion and see if she is eligible for a trial at [facility].  She will return for an office visit 04/23/2018.  She will continue hydrocodone as needed for pain.  She will contact us with the dose of gabapentin she is taking (we did not prescribe this).  25 minutes were spent with the patient today.  The majority of the time was used for counseling and coordination of care.  Renee Coder, MD  04/02/2018  5:56 PM

## 2018-04-03 ENCOUNTER — Other Ambulatory Visit: Payer: Self-pay | Admitting: Nurse Practitioner

## 2018-04-03 ENCOUNTER — Telehealth: Payer: Self-pay | Admitting: Oncology

## 2018-04-03 DIAGNOSIS — C20 Malignant neoplasm of rectum: Secondary | ICD-10-CM

## 2018-04-03 LAB — CEA (IN HOUSE-CHCC): CEA (CHCC-In House): 11.52 ng/mL — ABNORMAL HIGH (ref 0.00–5.00)

## 2018-04-03 NOTE — Telephone Encounter (Signed)
PT APPT. WITH DR LEE AT [facility] IS 04/11/18 @4 :00

## 2018-04-03 NOTE — Progress Notes (Signed)
PA for Xarelto has been submitted.

## 2018-04-03 NOTE — Progress Notes (Signed)
PA for Xarelto has been approved from 03/04/18 through 04/03/19.

## 2018-04-04 ENCOUNTER — Telehealth: Payer: Self-pay | Admitting: *Deleted

## 2018-04-04 ENCOUNTER — Inpatient Hospital Stay: Payer: 59

## 2018-04-04 NOTE — Telephone Encounter (Signed)
Received message from that her Xarelto has not been filled at her pharmacy.  Attempted to call Ivin Poot regarding prior authorization.  Message left for her to address this for patient ASAP.

## 2018-04-12 ENCOUNTER — Telehealth: Payer: Self-pay | Admitting: *Deleted

## 2018-04-12 MED ORDER — OXYCODONE HCL 5 MG PO TABS: 5.0000 mg | ORAL_TABLET | Freq: Four times a day (QID) | ORAL | 0 refills | Status: DC | PRN

## 2018-04-12 NOTE — Telephone Encounter (Signed)
Patient called to give report following appt with [facility] Dr. Truman Hayward. It has been suggested patient start back on Folfox instead of Folfiri. Patient would like to keep the same treatment schedule do to her personal schedule.    Refill- Patient reports Hydrocodone is not helping her pain. Dr. Truman Hayward suggested Oxycodone without Tylenol. She would like Dr. Benay Spice to prescribe this instead of refilling the Hydrocodone.  Discussed with Dr. Benay Spice- patient notified RX ready for pick up.

## 2018-04-13 ENCOUNTER — Telehealth: Payer: Self-pay | Admitting: *Deleted

## 2018-04-13 NOTE — Telephone Encounter (Signed)
Medical records faxed to [place]; release [id number]

## 2018-04-16 ENCOUNTER — Other Ambulatory Visit: Payer: Self-pay | Admitting: *Deleted

## 2018-04-16 DIAGNOSIS — C20 Malignant neoplasm of rectum: Secondary | ICD-10-CM

## 2018-04-16 MED ORDER — PANTOPRAZOLE SODIUM 20 MG PO TBEC: 20.0000 mg | DELAYED_RELEASE_TABLET | Freq: Every day | ORAL | 1 refills | Status: DC

## 2018-04-17 ENCOUNTER — Other Ambulatory Visit: Payer: Self-pay

## 2018-04-17 DIAGNOSIS — C20 Malignant neoplasm of rectum: Secondary | ICD-10-CM

## 2018-04-17 MED ORDER — PANTOPRAZOLE SODIUM 20 MG PO TBEC: 20.0000 mg | DELAYED_RELEASE_TABLET | Freq: Every day | ORAL | 1 refills | Status: DC

## 2018-04-17 NOTE — Telephone Encounter (Signed)
[facility]

## 2018-04-20 ENCOUNTER — Other Ambulatory Visit: Payer: Self-pay | Admitting: *Deleted

## 2018-04-20 DIAGNOSIS — C20 Malignant neoplasm of rectum: Secondary | ICD-10-CM

## 2018-04-22 ENCOUNTER — Other Ambulatory Visit: Payer: Self-pay | Admitting: Oncology

## 2018-04-22 NOTE — Progress Notes (Signed)
DISCONTINUE ON PATHWAY REGIMEN - Colorectal     A cycle is every 14 days:     Irinotecan      Leucovorin      5-Fluorouracil      5-Fluorouracil      Bevacizumab   **Always confirm dose/schedule in your pharmacy ordering system**  REASON: Disease Progression PRIOR TREATMENT: [id number]: FOLFIRI + Bevacizumab TREATMENT RESPONSE: Progressive Disease (PD)  START ON PATHWAY REGIMEN - Colorectal     A cycle is every 14 days:     Oxaliplatin      Leucovorin      5-Fluorouracil      5-Fluorouracil   **Always confirm dose/schedule in your pharmacy ordering system**  Patient Characteristics: Distant Metastases, Second Line, KRAS Mutation Positive/Unknown, BRAF Wild-Type/Unknown, Bevacizumab Ineligible Therapeutic Status: Distant Metastases BRAF Mutation Status: Wild-Type (no mutation) KRAS/NRAS Mutation Status: Mutation Positive Line of Therapy: Second Line  Intent of Therapy: Non-Curative / Palliative Intent, Discussed with Patient

## 2018-04-22 NOTE — Progress Notes (Signed)
ON PATHWAY REGIMEN - Colorectal  No Change  Continue With Treatment as Ordered.     A cycle is every 14 days:     Irinotecan      Leucovorin      5-Fluorouracil      5-Fluorouracil      Bevacizumab   **Always confirm dose/schedule in your pharmacy ordering system**  Patient Characteristics: Metastatic Colorectal, First Line, Nonsurgical Candidate, KRAS/NRAS Wild-Type, BRAF Wild-Type/Unknown, PS = 0,1; Bevacizumab Eligible Current evidence of distant metastases<= Yes AJCC T Category: Staged < 8th Ed. AJCC N Category: Staged < 8th Ed. AJCC M Category: Staged < 8th Ed. AJCC 8 Stage Grouping: Staged < 8th Ed. BRAF Mutation Status: Wild Type (no mutation) KRAS/NRAS Mutation Status: Wild Type (no mutation) Line of therapy: First Environmental consultant Status: PS = 0, 1 Bevacizumab Eligibility: Eligible Intent of Therapy: Non-Curative / Palliative Intent, Discussed with Patient

## 2018-04-23 ENCOUNTER — Inpatient Hospital Stay: Payer: 59

## 2018-04-23 ENCOUNTER — Inpatient Hospital Stay: Payer: 59 | Attending: Nurse Practitioner

## 2018-04-23 ENCOUNTER — Other Ambulatory Visit: Payer: Self-pay | Admitting: *Deleted

## 2018-04-23 ENCOUNTER — Inpatient Hospital Stay (HOSPITAL_BASED_OUTPATIENT_CLINIC_OR_DEPARTMENT_OTHER): Payer: 59 | Admitting: Oncology

## 2018-04-23 VITALS — BP 121/85 | HR 72 | Temp 98.6°F | Resp 19 | Ht 64.0 in | Wt 159.9 lb

## 2018-04-23 DIAGNOSIS — R270 Ataxia, unspecified: Secondary | ICD-10-CM | POA: Insufficient documentation

## 2018-04-23 DIAGNOSIS — G893 Neoplasm related pain (acute) (chronic): Secondary | ICD-10-CM | POA: Diagnosis not present

## 2018-04-23 DIAGNOSIS — C20 Malignant neoplasm of rectum: Secondary | ICD-10-CM | POA: Diagnosis not present

## 2018-04-23 DIAGNOSIS — C7931 Secondary malignant neoplasm of brain: Secondary | ICD-10-CM | POA: Insufficient documentation

## 2018-04-23 DIAGNOSIS — Z5111 Encounter for antineoplastic chemotherapy: Secondary | ICD-10-CM | POA: Diagnosis not present

## 2018-04-23 DIAGNOSIS — R112 Nausea with vomiting, unspecified: Secondary | ICD-10-CM | POA: Diagnosis not present

## 2018-04-23 DIAGNOSIS — R1010 Upper abdominal pain, unspecified: Secondary | ICD-10-CM | POA: Insufficient documentation

## 2018-04-23 LAB — CMP (CANCER CENTER ONLY)
ALBUMIN: 3.6 g/dL (ref 3.5–5.0)
ALT: 10 U/L (ref 0–44)
ANION GAP: 10 (ref 5–15)
AST: 13 U/L — AB (ref 15–41)
Alkaline Phosphatase: 59 U/L (ref 38–126)
BUN: 17 mg/dL (ref 6–20)
CO2: 27 mmol/L (ref 22–32)
Calcium: 9.5 mg/dL (ref 8.9–10.3)
Chloride: 104 mmol/L (ref 98–111)
Creatinine: 1 mg/dL (ref 0.44–1.00)
GFR, Est AFR Am: >60 mL/min (ref >60)
GFR, Estimated: >60 mL/min (ref >60)
GLUCOSE: 98 mg/dL (ref 70–99)
POTASSIUM: 3.5 mmol/L (ref 3.5–5.1)
SODIUM: 141 mmol/L (ref 135–145)
Total Bilirubin: 0.3 mg/dL (ref 0.3–1.2)
Total Protein: 7 g/dL (ref 6.5–8.1)

## 2018-04-23 LAB — CBC WITH DIFFERENTIAL (CANCER CENTER ONLY)
ABS IMMATURE GRANULOCYTES: 0.01 10*3/uL (ref 0.00–0.07)
Basophils Absolute: 0 10*3/uL (ref 0.0–0.1)
Basophils Relative: 1 %
Eosinophils Absolute: 0.2 10*3/uL (ref 0.0–0.5)
Eosinophils Relative: 5 %
HEMATOCRIT: 38 % (ref 36.0–46.0)
Hemoglobin: 12.5 g/dL (ref 12.0–15.0)
IMMATURE GRANULOCYTES: 0 %
LYMPHS ABS: 0.6 10*3/uL — AB (ref 0.7–4.0)
Lymphocytes Relative: 12 %
MCH: 31.2 pg (ref 26.0–34.0)
MCHC: 32.9 g/dL (ref 30.0–36.0)
MCV: 94.8 fL (ref 80.0–100.0)
MONO ABS: 0.4 10*3/uL (ref 0.1–1.0)
MONOS PCT: 8 %
NEUTROS ABS: 3.5 10*3/uL (ref 1.7–7.7)
NEUTROS PCT: 74 %
Platelet Count: 283 10*3/uL (ref 150–400)
RBC: 4.01 MIL/uL (ref 3.87–5.11)
RDW: 11.8 % (ref 11.5–15.5)
WBC Count: 4.8 10*3/uL (ref 4.0–10.5)
nRBC: 0 % (ref 0.0–0.2)

## 2018-04-23 MED ORDER — HEPARIN SOD (PORK) LOCK FLUSH 100 UNIT/ML IV SOLN
500.0000 [IU] | Freq: Once | INTRAVENOUS | Status: DC | PRN
  Filled 2018-04-23: qty 5

## 2018-04-23 MED ORDER — DEXAMETHASONE SODIUM PHOSPHATE 10 MG/ML IJ SOLN
INTRAMUSCULAR | Status: AC
  Filled 2018-04-23: qty 1

## 2018-04-23 MED ORDER — PALONOSETRON HCL INJECTION 0.25 MG/5ML
0.2500 mg | Freq: Once | INTRAVENOUS | Status: AC
  Administered 2018-04-23: 0.25 mg via INTRAVENOUS

## 2018-04-23 MED ORDER — DEXTROSE 5 % IV SOLN
Freq: Once | INTRAVENOUS | Status: AC
  Administered 2018-04-23: 10:00:00 via INTRAVENOUS
  Filled 2018-04-23: qty 250

## 2018-04-23 MED ORDER — LEUCOVORIN CALCIUM INJECTION 350 MG
400.0000 mg/m2 | Freq: Once | INTRAVENOUS | Status: AC
  Administered 2018-04-23: 720 mg via INTRAVENOUS
  Filled 2018-04-23: qty 36

## 2018-04-23 MED ORDER — HYDROMORPHONE HCL 4 MG PO TABS: 4.0000 mg | ORAL_TABLET | ORAL | 0 refills | Status: AC | PRN

## 2018-04-23 MED ORDER — FLUOROURACIL CHEMO INJECTION 2.5 GM/50ML
400.0000 mg/m2 | Freq: Once | INTRAVENOUS | Status: AC
  Administered 2018-04-23: 700 mg via INTRAVENOUS
  Filled 2018-04-23: qty 14

## 2018-04-23 MED ORDER — OXALIPLATIN CHEMO INJECTION 100 MG/20ML: 85.0000 mg/m2 | Freq: Once | INTRAVENOUS | Status: DC

## 2018-04-23 MED ORDER — SODIUM CHLORIDE 0.9 % IV SOLN: 10.0000 mg | Freq: Once | INTRAVENOUS | Status: DC

## 2018-04-23 MED ORDER — PALONOSETRON HCL INJECTION 0.25 MG/5ML
INTRAVENOUS | Status: AC
  Filled 2018-04-23: qty 5

## 2018-04-23 MED ORDER — OXALIPLATIN CHEMO INJECTION 100 MG/20ML
84.0000 mg/m2 | Freq: Once | INTRAVENOUS | Status: AC
  Administered 2018-04-23: 150 mg via INTRAVENOUS
  Filled 2018-04-23: qty 20

## 2018-04-23 MED ORDER — SODIUM CHLORIDE 0.9 % IV SOLN
2400.0000 mg/m2 | INTRAVENOUS | Status: DC
  Administered 2018-04-23: 4300 mg via INTRAVENOUS
  Filled 2018-04-23: qty 86

## 2018-04-23 MED ORDER — ALPRAZOLAM 1 MG PO TABS: 1.0000 mg | ORAL_TABLET | Freq: Every evening | ORAL | 0 refills | Status: DC | PRN

## 2018-04-23 MED ORDER — DEXAMETHASONE SODIUM PHOSPHATE 10 MG/ML IJ SOLN
10.0000 mg | Freq: Once | INTRAMUSCULAR | Status: AC
  Administered 2018-04-23: 10 mg via INTRAVENOUS

## 2018-04-23 MED ORDER — SODIUM CHLORIDE 0.9% FLUSH
10.0000 mL | INTRAVENOUS | Status: DC | PRN
  Filled 2018-04-23: qty 10

## 2018-04-23 NOTE — Patient Instructions (Signed)
Implanted Port Home Guide An implanted port is a type of central line that is placed under the skin. Central lines are used to provide IV access when treatment or nutrition needs to be given through a person's veins. Implanted ports are used for long-term IV access. An implanted port may be placed because:  You need IV medicine that would be irritating to the small veins in your hands or arms.  You need long-term IV medicines, such as antibiotics.  You need IV nutrition for a long period.  You need frequent blood draws for lab tests.  You need dialysis.  Implanted ports are usually placed in the chest area, but they can also be placed in the upper arm, the abdomen, or the leg. An implanted port has two main parts:  Reservoir. The reservoir is round and will appear as a small, raised area under your skin. The reservoir is the part where a needle is inserted to give medicines or draw blood.  Catheter. The catheter is a thin, flexible tube that extends from the reservoir. The catheter is placed into a large vein. Medicine that is inserted into the reservoir goes into the catheter and then into the vein.  How will I care for my incision site? Do not get the incision site wet. Bathe or shower as directed by your health care provider. How is my port accessed? Special steps must be taken to access the port:  Before the port is accessed, a numbing cream can be placed on the skin. This helps numb the skin over the port site.  Your health care provider uses a sterile technique to access the port. ? Your health care provider must put on a mask and sterile gloves. ? The skin over your port is cleaned carefully with an antiseptic and allowed to dry. ? The port is gently pinched between sterile gloves, and a needle is inserted into the port.  Only "non-coring" port needles should be used to access the port. Once the port is accessed, a blood return should be checked. This helps ensure that the port  is in the vein and is not clogged.  If your port needs to remain accessed for a constant infusion, a clear (transparent) bandage will be placed over the needle site. The bandage and needle will need to be changed every week, or as directed by your health care provider.  Keep the bandage covering the needle clean and dry. Do not get it wet. Follow your health care provider's instructions on how to take a shower or bath while the port is accessed.  If your port does not need to stay accessed, no bandage is needed over the port.  What is flushing? Flushing helps keep the port from getting clogged. Follow your health care provider's instructions on how and when to flush the port. Ports are usually flushed with saline solution or a medicine called heparin. The need for flushing will depend on how the port is used.  If the port is used for intermittent medicines or blood draws, the port will need to be flushed: ? After medicines have been given. ? After blood has been drawn. ? As part of routine maintenance.  If a constant infusion is running, the port may not need to be flushed.  How long will my port stay implanted? The port can stay in for as long as your health care provider thinks it is needed. When it is time for the port to come out, surgery will be   done to remove it. The procedure is similar to the one performed when the port was put in. When should I seek immediate medical care? When you have an implanted port, you should seek immediate medical care if:  You notice a bad smell coming from the incision site.  You have swelling, redness, or drainage at the incision site.  You have more swelling or pain at the port site or the surrounding area.  You have a fever that is not controlled with medicine.  This information is not intended to replace advice given to you by your health care provider. Make sure you discuss any questions you have with your health care provider. Document  Released: 06/06/2005 Document Revised: 11/12/2015 Document Reviewed: 02/11/2013 Elsevier Interactive Patient Education  2017 [employer]  

## 2018-04-23 NOTE — Patient Instructions (Signed)
[place] Discharge Instructions for Patients Receiving Chemotherapy  Today you received the following chemotherapy agents oxaliplatin (Eloxatin), leucovorin, and fluorouracil (Adrucil/5FU)  To help prevent nausea and vomiting after your treatment, we encourage you to take your nausea medication as directed  If you develop nausea and vomiting that is not controlled by your nausea medication, call the clinic.   BELOW ARE SYMPTOMS THAT SHOULD BE REPORTED IMMEDIATELY:  *FEVER GREATER THAN 100.5 F  *CHILLS WITH OR WITHOUT FEVER  NAUSEA AND VOMITING THAT IS NOT CONTROLLED WITH YOUR NAUSEA MEDICATION  *UNUSUAL SHORTNESS OF BREATH  *UNUSUAL BRUISING OR BLEEDING  TENDERNESS IN MOUTH AND THROAT WITH OR WITHOUT PRESENCE OF ULCERS  *URINARY PROBLEMS  *BOWEL PROBLEMS  UNUSUAL RASH Items with * indicate a potential emergency and should be followed up as soon as possible.  Feel free to call the clinic should you have any questions or concerns. The clinic phone number is [phone].  Please show the [place] at check-in to the Emergency Department and triage nurse.

## 2018-04-23 NOTE — Progress Notes (Signed)
[facility] OFFICE PROGRESS NOTE   Diagnosis: Colon cancer  INTERVAL HISTORY:   [name] turns as scheduled.  She continues to have pain in the right pelvis and right leg.  She takes oxycodone at night.  10 mg of oxycodone does not relieve the pain.  She has less pain during the day. She saw Dr. Truman Hayward at [facility].  She does not qualify for a current clinical trial at [facility].  She has scheduled a second opinion appointment at [facility]. Dr. Truman Hayward recommends proceeding with FOLFOX.  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  This does not interfere with activity.  Objective:  Vital signs in last 24 hours:  Blood pressure 121/85, pulse 72, temperature 98.6 F (37 C), temperature source Oral, resp. rate 19, height 5' 4"  (1.626 m), weight 159 lb 14.4 oz (72.5 kg), SpO2 100 %.    HEENT: 2-3 cm mass in the left lower medial neck, no thrush or ulcers Resp: Lungs clear bilaterally Cardio: Regular rate and rhythm GI: No hepatomegaly, no mass, nontender Vascular: No leg edema  Portacath/PICC-without erythema  Lab Results:  Lab Results  Component Value Date   WBC 4.8 04/23/2018   HGB 12.5 04/23/2018   HCT 38.0 04/23/2018   MCV 94.8 04/23/2018   PLT 283 04/23/2018   NEUTROABS 3.5 04/23/2018    CMP  Lab Results  Component Value Date   NA 141 04/23/2018   K 3.5 04/23/2018   CL 104 04/23/2018   CO2 27 04/23/2018   GLUCOSE 98 04/23/2018   BUN 17 04/23/2018   CREATININE 1.00 04/23/2018   CALCIUM 9.5 04/23/2018   PROT 7.0 04/23/2018   ALBUMIN 3.6 04/23/2018   AST 13 (L) 04/23/2018   ALT 10 04/23/2018   ALKPHOS 59 04/23/2018   BILITOT 0.3 04/23/2018   GFRNONAA >60 04/23/2018   GFRAA >60 04/23/2018    Lab Results  Component Value Date   CEA1 11.52 (H) 04/02/2018    Medications: I have reviewed the patient's current medications.   Assessment/Plan: 1. Rectal cancer, clinical stage III (T3 N1) status post biopsy of a  rectal mass on 05/24/2013 confirming adenocarcinoma. Endoscopic ultrasound 05/31/2013 measured the mass at 11 cm from the anal verge, uT3uN1.  Initiation of radiation and concurrent Xeloda 06/18/2013; completion 07/26/2013.   Status post low anterior resection 09/02/2013. Invasive adenocarcinoma, 3.7 cm, negative margins; metastatic carcinoma in 7 of 11 lymph nodes; extensive lymph vascular involvement by tumor.MSI-stable, no loss of mismatch repair protein expression  Cycle 1 adjuvant FOLFOX 10/15/2013.   Cycle 2 adjuvant FOLFOX 10/29/2013.   Cycle 3 adjuvant FOLFOX 11/14/2013.   Cycle 4 adjuvant FOLFOX 11/27/2013.   Cycle 5 adjuvant FOLFOX 12/10/2013   Cycle 6 adjuvant FOLFOX 12/24/2013.   Cycle 7 adjuvant FOLFOX 01/14/2014.   Cycle 8 adjuvant FOLFOX 01/28/2014   Cycle 9 adjuvant FOLFOX 02/11/2014 (oxaliplatin dose reduced secondary to thrombocytopenia)   Surveillance CT scans 06/02/2014 with no evidence of recurrent rectal cancer  surveillance CT scans 06/16/2015 with no evidence of recurrent rectal cancer, 10 mm right external iliac node , increased amount of loculated air associated with the presacral soft tissue thickening , new left hydronephrosis  Surveillance CT scans 05/06/2016-negative for recurrent rectal cancer, decreased right external iliac node, persistent left hydronephrosis  CT abdomen/pelvis 07/05/2017-new left para-aortic, aortocaval, right common iliac and right external iliac lymphadenopathy.  CT neck/chest 07/13/2017-enlarged left supraclavicular nodes, new paratracheal nodes  Ultrasound-guided biopsy of a left supraclavicular lymph  node 07/25/2017-metastatic adenocarcinoma consistent with colorectal cancer,MSI-stable, tumor mutation burden-10, K-ras S55mtation  Cycle 1 FOLFIRI 08/28/2017  Cycle 2 FOLFIRI 09/11/2017  Cycle 3 FOLFIRI 09/25/2017  Cycle 4 FOLFIRI 10/09/2017  Cycle 5 FOLFIRI 10/23/2017  CTs 11/14/2017-slight decrease in left  supraclavicular adenopathy, slight increase and decrease in mediastinal/retroperitoneal/pelvic nodes, no new disease  Cycle 6 FOLFIRI 11/14/2017  Cycle 7 FOLFIRI 11/27/2017  Cycle 8 FOLFIRI 12/11/2017  Cycle 9 FOLFIRI 01/03/2018  Cycle 10 FOLFIRI 01/18/2018  Restaging CTs 01/29/2018-stable thoracic adenopathywith mildly improved abdominal adenopathy. Similar appearance of presacral soft tissue density. Suspected small fistula from the posterior margin of the rectum or anastomosis to the presacral region.  Cycle 11 FOLFIRI 01/31/2018  Cycle 12 FOLFIRI 02/20/2018  CT pelvis 03/16/2018-mild progression of pelvic lymphadenopathy with compression of the right iliac vein by an enlarged right obturator node  Cycle 1 FOLFOX 04/23/2018 2. Post colonoscopy bowel perforation confirmed on abdomen CT 05/28/2013. 3. Single indeterminate liver lesion on the abdomen CT 05/28/2013. MRI on 06/18/2013 confirmed 2 tiny liver lesions, indeterminate-favored to be benign cysts. 4. History of cervical and lumbar disc disease.  5. History of HELLP syndrome with first pregnancy.  6. Delayed nausea following cycle 1 FOLFOX. She receives Aloxi and Emend. She continues to experience delayed nausea. Prophylactic Decadron added with cycle 4. 7. History of pain with bowel movements. 8. Anastomotic leak on CT 09/13/2013. Status post exploratory laparotomy with drainage of a pelvic abscess, diverting loop ileostomy. Drainage catheter removed 10/23/2013.  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 Perineal numbness, urinary retention. The urinary retention has improved. 10. Perineal pain-improved. 11. Neutropenia secondary chemotherapy-she received Neulasta. 12. Urinary tract infection, Escherichia coli-11/03/2013. 13. Venous engorgement at the left anterior chest and left arm with swelling of the left arm and  hand. Venous Doppler 11/14/2013 positive for DVT. She began Lovenox 11/14/2013 and was switched to xarelto 12/10/2013. Xarelto discontinued early January 2016. 14. Fever 11/20/2013. Blood and urine cultures negative. The fever resolved within 24 hours. She continued to have a single episode of fever following chemotherapy 15. History of a herpetic lip lesion 16. 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 17. Escherichia coli bacteremia, urinary source, 01/14/2014. She completed a 10 day course of ceftriaxone. 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 Anxiety/depression. 20. Port-A-Cath removal 06/09/2014. 21. Escherichia coli urinary tract infection 06/24/2014. She completed a course of ciprofloxacin. 22. frequent bowel movements following surgery/radiation -status post placement of a sacral stimulator 11/21/2014 23. CT 06/16/2015 with new left hydronephrosis status post cystoscopy with placement of a double-J stent 07/17/2015. No tumors or stones were seen in the bladder. Both ureteral orifices were unremarkable. On the left side retrograde study was performed. The distal ureter was narrowed and then a little bit more dilated proximally with hydronephrosis noted. There was no evidence of intramural tumor but the area did seem a bit strictured.  Stent removed, persistent hydronephrosis, followed by Dr. EAmalia Hailey CT 05/06/2016-increased left hydronephrosis  Ultrasound 09/09/2016-stable left hydroureteronephrosis  Left nephrectomy 04/28/2017  24.  Compression of right iliac vein on CT 03/16/2018-placed on Xarelto anticoagulation     Disposition: [name] appears unchanged.  She continues to have pain in the right pelvis and right leg secondary to metastatic lymphadenopathy in the pelvis.  I discussed treatment options with Ms. BWillefordher husband.  She does not qualify for a current clinical trial at [facility]  I agree with  the  recommendation of Dr. Truman Hayward to begin salvage FOLFOX chemotherapy.  We reviewed potential toxicities associated with this regimen including the chance for nausea/vomiting, worsened neuropathy, and allergic reaction, and hematologic toxicity.  She agrees to proceed.  We will follow her pain, the CEA, and metastatic lymphadenopathy as markers of tumor response.  She plans to seek other opinion at [facility] within the next few weeks.  She will begin a trial of hydromorphone for pain.  She will let us know if this does not help.  She will return for an office visit in the next cycle of FOLFOX on 05/10/2018.  A chemotherapy plan was entered.  Betsy Coder, MD  04/23/2018  10:03 AM

## 2018-04-24 ENCOUNTER — Emergency Department (HOSPITAL_COMMUNITY): Payer: 59

## 2018-04-24 ENCOUNTER — Encounter (HOSPITAL_COMMUNITY): Payer: Self-pay | Admitting: Emergency Medicine

## 2018-04-24 ENCOUNTER — Telehealth: Payer: Self-pay | Admitting: Oncology

## 2018-04-24 ENCOUNTER — Other Ambulatory Visit: Payer: Self-pay

## 2018-04-24 ENCOUNTER — Emergency Department (HOSPITAL_COMMUNITY)
Admission: EM | Admit: 2018-04-24 | Discharge: 2018-04-24 | Disposition: A | Discharge status: Home / Self Care | Payer: 59 | Attending: Emergency Medicine | Admitting: Emergency Medicine

## 2018-04-24 DIAGNOSIS — I10 Essential (primary) hypertension: Secondary | ICD-10-CM | POA: Insufficient documentation

## 2018-04-24 DIAGNOSIS — Z79899 Other long term (current) drug therapy: Secondary | ICD-10-CM | POA: Diagnosis not present

## 2018-04-24 DIAGNOSIS — Z85038 Personal history of other malignant neoplasm of large intestine: Secondary | ICD-10-CM | POA: Diagnosis not present

## 2018-04-24 DIAGNOSIS — R112 Nausea with vomiting, unspecified: Secondary | ICD-10-CM | POA: Insufficient documentation

## 2018-04-24 DIAGNOSIS — Z7901 Long term (current) use of anticoagulants: Secondary | ICD-10-CM | POA: Diagnosis not present

## 2018-04-24 DIAGNOSIS — Z9221 Personal history of antineoplastic chemotherapy: Secondary | ICD-10-CM | POA: Insufficient documentation

## 2018-04-24 DIAGNOSIS — R109 Unspecified abdominal pain: Secondary | ICD-10-CM | POA: Diagnosis present

## 2018-04-24 DIAGNOSIS — R1030 Lower abdominal pain, unspecified: Secondary | ICD-10-CM | POA: Insufficient documentation

## 2018-04-24 DIAGNOSIS — Z923 Personal history of irradiation: Secondary | ICD-10-CM | POA: Insufficient documentation

## 2018-04-24 LAB — CBC
HCT: 42 % (ref 36.0–46.0)
Hemoglobin: 13.4 g/dL (ref 12.0–15.0)
MCH: 30.3 pg (ref 26.0–34.0)
MCHC: 31.9 g/dL (ref 30.0–36.0)
MCV: 95 fL (ref 80.0–100.0)
Platelets: 253 10*3/uL (ref 150–400)
RBC: 4.42 MIL/uL (ref 3.87–5.11)
RDW: 11.5 % (ref 11.5–15.5)
WBC: 4.9 10*3/uL (ref 4.0–10.5)
nRBC: 0 % (ref 0.0–0.2)

## 2018-04-24 LAB — COMPREHENSIVE METABOLIC PANEL
ALT: 14 U/L (ref 0–44)
AST: 22 U/L (ref 15–41)
Albumin: 4 g/dL (ref 3.5–5.0)
Alkaline Phosphatase: 50 U/L (ref 38–126)
Anion gap: 13 (ref 5–15)
BUN: 15 mg/dL (ref 6–20)
CO2: 25 mmol/L (ref 22–32)
Calcium: 9.6 mg/dL (ref 8.9–10.3)
Chloride: 102 mmol/L (ref 98–111)
Creatinine, Ser: 1.07 mg/dL — ABNORMAL HIGH (ref 0.44–1.00)
GFR calc Af Amer: >60 mL/min (ref >60)
GFR calc non Af Amer: 57 mL/min — ABNORMAL LOW (ref >60)
Glucose, Bld: 136 mg/dL — ABNORMAL HIGH (ref 70–99)
Potassium: 3.6 mmol/L (ref 3.5–5.1)
Sodium: 140 mmol/L (ref 135–145)
Total Bilirubin: 0.4 mg/dL (ref 0.3–1.2)
Total Protein: 7.6 g/dL (ref 6.5–8.1)

## 2018-04-24 LAB — URINALYSIS, ROUTINE W REFLEX MICROSCOPIC
Bacteria, UA: NONE SEEN
Bilirubin Urine: NEGATIVE
Glucose, UA: NEGATIVE mg/dL
Hgb urine dipstick: NEGATIVE
Ketones, ur: 5 mg/dL — AB
Leukocytes, UA: NEGATIVE
Nitrite: NEGATIVE
Protein, ur: 30 mg/dL — AB
Specific Gravity, Urine: 1.018 (ref 1.005–1.030)
pH: 7 (ref 5.0–8.0)

## 2018-04-24 LAB — LIPASE, BLOOD: Lipase: 28 U/L (ref 11–51)

## 2018-04-24 MED ORDER — SODIUM CHLORIDE 0.9 % IJ SOLN
INTRAMUSCULAR | Status: AC
  Administered 2018-04-24: 22:00:00
  Filled 2018-04-24: qty 50

## 2018-04-24 MED ORDER — HYDROMORPHONE HCL 1 MG/ML IJ SOLN
1.0000 mg | Freq: Once | INTRAMUSCULAR | Status: AC
  Administered 2018-04-24: 1 mg via INTRAVENOUS
  Filled 2018-04-24: qty 1

## 2018-04-24 MED ORDER — PROMETHAZINE HCL 25 MG/ML IJ SOLN
12.5000 mg | Freq: Once | INTRAMUSCULAR | Status: AC
  Administered 2018-04-24: 12.5 mg via INTRAVENOUS
  Filled 2018-04-24: qty 1

## 2018-04-24 MED ORDER — IOPAMIDOL (ISOVUE-300) INJECTION 61%
INTRAVENOUS | Status: AC
  Filled 2018-04-24: qty 100

## 2018-04-24 MED ORDER — IOPAMIDOL (ISOVUE-300) INJECTION 61%
100.0000 mL | Freq: Once | INTRAVENOUS | Status: AC | PRN
  Administered 2018-04-24: 100 mL via INTRAVENOUS

## 2018-04-24 MED ORDER — HYDROMORPHONE HCL 1 MG/ML IJ SOLN: 1.0000 mg | Freq: Once | INTRAMUSCULAR | Status: DC

## 2018-04-24 NOTE — ED Triage Notes (Signed)
Pt has hx of rectal cancer. Pt currently has chemo infusing. Pt states she has had increased pain and nausea x 1 month the patient began throwing up her pain medication today. Per husband, they are concerned pt is dehydrated. Pt is afebrile and is not tachycardic.   Pt is hard of hearing and does not have her hearing aids in.

## 2018-04-24 NOTE — ED Provider Notes (Signed)
[facility] DEPT Provider Note   CSN: [id number] Arrival date & time: 04/24/18  1945     History   Chief Complaint Chief Complaint  Patient presents with  . Abdominal Pain    HPI [name] is a 57 y.o. female.  HPI   57 year old female with abdominal pain.  Increasing pain and nausea over the past several weeks.  Today she threw up her pain medication.  Husband is concerned she may be dehydrated.  She has a past history of colon cancer currently undergoing treatment.  No fevers or chills.  No acute urinary complaints.  Past Medical History:  Diagnosis Date  . Anxiety   . Arthritis 08-27-13   at present has ruptured disc- lower back-not an issue now  . Blood infection ([place])    E Coli after UTI   . Colon cancer ([place]) 08-27-13   radiation /chemo -last ended 4 weeks(Dr. Benay Spice)  . DVT (deep venous thrombosis) ([place])    left upper extremity currently has stopped xarelto   . History of radiation therapy 06/18/13-07/26/13   rectal 50.4Gy total dose  . Hypertension    toxemia with pregnancy 1990  . IUD (intrauterine device) in place 08-27-13   Mirena Implant inplace  . Nerve damage    numbness in pelvic area from rectal rection straining to empty bladder   . PONV (postoperative nausea and vomiting)    scopalamine patch helped  . Stress incontinence   . UTI (lower urinary tract infection)    hx of     Patient Active Problem List   Diagnosis Date Noted  . Port-A-Cath in place 09/11/2017  . History of ileostomy 05/02/2014  . DVT (deep venous thrombosis) ([place]) 01/14/2014  . Urinary tract infection 01/13/2014  . Large bowel perforation ([place]) 09/13/2013  . Rectal cancer, 12 - 15 cm from anal verge 05/29/2013  . Herniation of cervical intervertebral disc with radiculopathy 01/03/2012    Past Surgical History:  Procedure Laterality Date  . APPENDECTOMY    . BACK SURGERY    . bladder study      pressure readings had approx 1.5 months  ago   . BREAST SURGERY     reduction surgery  . CERVICAL DISC ARTHROPLASTY  01/03/2012   Procedure: CERVICAL ANTERIOR DISC ARTHROPLASTY;  Surgeon: Charlie Pitter, MD;  Location: [facility];  Service: Neurosurgery;  Laterality: N/A;  Cervical Anterior Disc Arthorplasty Five-Six  . CESAREAN SECTION     x2  . EUS N/A 05/31/2013   Procedure: LOWER ENDOSCOPIC ULTRASOUND (EUS);  Surgeon: Beryle Beams, MD;  Location: [facility];  Service: Endoscopy;  Laterality: N/A;  . ILEO LOOP COLOSTOMY CLOSURE N/A 05/02/2014   Procedure: LAPAROSCOPIC ASSISTED REVERSAL ILEOSTOMY AND LYSIS OF ADHESIONS;  Surgeon: [name], MD;  Location: [facility];  Service: General;  Laterality: N/A;  . INTRAUTERINE DEVICE INSERTION  2006   [facility]  . IR FLUORO GUIDE PORT INSERTION RIGHT  08/25/2017  . IR US GUIDE VASC ACCESS RIGHT  08/25/2017  . KNEE ARTHROSCOPY Left 08-27-13   torn meniscus repair left   . LAPAROSCOPIC LOW ANTERIOR RESECTION N/A 09/02/2013   Procedure: LAPAROSCOPIC LOW ANTERIOR RESECTION;  Surgeon: [name], MD;  Location: [facility];  Service: General;  Laterality: N/A;  . LAPAROTOMY N/A 09/13/2013   Procedure: EXPLORATORY LAPAROTOMY / FLEXIBLE SIGMOIDOSCOPY/  DRAINAGE OF PELVIC ABSCESS WITH  DIVERTING  LOOP  ILEOSTOMY;  Surgeon: [name], MD;  Location: [facility];  Service:  General;  Laterality: N/A;  . LYSIS OF ADHESION  05/02/2014   Procedure: LYSIS OF ADHESION;  Surgeon: [name], MD;  Location: [facility];  Service: General;;  . PORT-A-CATH REMOVAL Left 06/09/2014   Procedure: REMOVAL PORT-A-CATH  (MINOR PROCEDURE) ;  Surgeon: [name], MD;  Location: [facility];  Service: General;  Laterality: Left;  . PORTACATH PLACEMENT Left 10/11/2013   Procedure: INSERTION PORT-A-CATH;  Surgeon: [name], MD;  Location: [facility];  Service: General;  Laterality: Left;  . REFRACTIVE SURGERY       OB History   None      Home Medications    Prior to Admission medications   Medication  Sig Start Date End Date Taking? Authorizing Provider  acetaminophen (TYLENOL) 500 MG tablet Take 1,000 mg by mouth every 6 (six) hours as needed for mild pain.    [provider]  acyclovir (ZOVIRAX) 400 MG tablet Take 1 tablet (400 mg total) by mouth 2 (two) times daily as needed. Patient not taking: Reported on 04/23/2018 01/09/18   Owens Shark, NP  ALPRAZolam Duanne Moron) 1 MG tablet Take 1 tablet (1 mg total) by mouth at bedtime as needed for anxiety. 04/23/18   Ladell Pier, MD  dexamethasone (DECADRON) 4 MG tablet Take 1 tablet (4 mg total) by mouth daily. Take for 3 days, start the day after chemo. Patient not taking: Reported on 04/23/2018 09/11/17   Ladell Pier, MD  diphenoxylate-atropine (LOMOTIL) 2.5-0.025 MG tablet Take 2 tablets by mouth 4 (four) times daily as needed for diarrhea or loose stools. 01/05/18   Tanner, Lyndon Code., PA-C  gabapentin (NEURONTIN) 300 MG capsule Take 300 mg by mouth 3 (three) times daily.    [provider]  hydrochlorothiazide (HYDRODIURIL) 25 MG tablet Take 25 mg by mouth daily as needed.  07/11/17   [provider]  HYDROmorphone (DILAUDID) 4 MG tablet Take 1-2 tablets (4-8 mg total) by mouth every 4 (four) hours as needed for severe pain. 04/23/18   Ladell Pier, MD  lidocaine-prilocaine (EMLA) cream Apply to port site 1 hour prior to use 08/29/17   Owens Shark, NP  MINIVELLE 0.075 MG/24HR Place 1 patch onto the skin 2 (two) times a week.  10/23/15   [provider]  Multiple Vitamin (MULTIVITAMIN WITH MINERALS) TABS tablet Take 1 tablet by mouth daily.    [provider]  ondansetron (ZOFRAN) 8 MG tablet Take 1 tablet (8 mg total) by mouth every 8 (eight) hours as needed for nausea or vomiting. Start day 3 after chemo. 10/10/17   Ladell Pier, MD  Pancrelipase, Lip-Prot-Amyl, (ZENPEP) 40000 units CPEP Take 40,000 Units by mouth 3 (three) times daily with meals. May take up to 4 times 11/21/14   [provider]  pantoprazole (PROTONIX) 20 MG tablet Take 1 tablet (20 mg total) by mouth daily. 04/17/18   Ladell Pier, MD  prochlorperazine (COMPAZINE) 10 MG tablet Take 1 tablet (10 mg total) by mouth every 6 (six) hours as needed for nausea or vomiting. 08/29/17   Owens Shark, NP  progesterone (PROMETRIUM) 100 MG capsule Take 100 mg by mouth at bedtime.  10/29/15   [provider]  rivaroxaban (XARELTO) 20 MG TABS tablet Take 1 tablet (20 mg total) by mouth daily with supper. 03/22/18   Owens Shark, NP  sertraline (ZOLOFT) 100 MG tablet Take 150 mg by mouth daily.  06/09/15   [provider]  Family History No family history on file.  Social History Social History   Tobacco Use  . Smoking status: Never Smoker  . Smokeless tobacco: Never Used  Substance Use Topics  . Alcohol use: Yes    Comment: weekends   . Drug use: No     Allergies   Patient has no known allergies.   Review of Systems Review of Systems  All systems reviewed and negative, other than as noted in HPI.  Physical Exam Updated Vital Signs BP 112/68 (BP Location: Right Arm)   Pulse 78   Temp 99.2 F (37.3 C) (Oral)   Resp 20   SpO2 99%   Physical Exam  Constitutional: She appears well-developed and well-nourished. No distress.  HENT:  Head: Normocephalic and atraumatic.  Eyes: Conjunctivae are normal. Right eye exhibits no discharge. Left eye exhibits no discharge.  Neck: Neck supple.  Cardiovascular: Normal rate, regular rhythm and normal heart sounds. Exam reveals no gallop and no friction rub.  No murmur heard. Pulmonary/Chest: Effort normal and breath sounds normal. No respiratory distress.  Abdominal: Soft. She exhibits no distension. There is tenderness.  Tenderness across the lower abdomen.  Does not lateralize.  No distention.  No rebound or guarding.  Musculoskeletal: She exhibits no edema or tenderness.  Neurological: She is alert.  Skin: Skin is warm and  dry.  Psychiatric: She has a normal mood and affect. Her behavior is normal. Thought content normal.  Nursing note and vitals reviewed.    ED Treatments / Results  Labs (all labs ordered are listed, but only abnormal results are displayed) Labs Reviewed  COMPREHENSIVE METABOLIC PANEL - Abnormal; Notable for the following components:      Result Value   Glucose, Bld 136 (*)    Creatinine, Ser 1.07 (*)    GFR calc non Af Amer 57 (*)    All other components within normal limits  URINALYSIS, ROUTINE W REFLEX MICROSCOPIC - Abnormal; Notable for the following components:   Ketones, ur 5 (*)    Protein, ur 30 (*)    All other components within normal limits  LIPASE, BLOOD  CBC    EKG None  Radiology No results found.   Ct Abdomen Pelvis W Contrast  Result Date: 04/24/2018 CLINICAL DATA:  Rectal cancer pain and nausea EXAM: CT ABDOMEN AND PELVIS WITH CONTRAST TECHNIQUE: Multidetector CT imaging of the abdomen and pelvis was performed using the standard protocol following bolus administration of intravenous contrast. CONTRAST:  100 mL Isovue-300 intravenous COMPARISON:  CT 07/05/2017, 05/06/2016, 06/16/2015, 03/16/2018, 01/29/2018 FINDINGS: Lower chest: Lung bases demonstrate dependent atelectasis. No pleural effusion. The heart size is normal Hepatobiliary: Subcentimeter hypodensity in the peripheral right hepatic lobe, no change. No new liver mass. Focal fat infiltration near the falciform ligament. No calcified gallstone or biliary dilatation Pancreas: Unremarkable. No pancreatic ductal dilatation or surrounding inflammatory changes. Spleen: Normal in size without focal abnormality. Adrenals/Urinary Tract: Adrenal glands are normal. Status post left nephrectomy. Right kidney shows no hydronephrosis. The bladder is normal Stomach/Bowel: Stomach nonenlarged. No dilated small bowel. No colon wall thickening. Postsurgical changes at the rectum. Vascular/Lymphatic: Nonaneurysmal aorta. Slight  interval increase in retroperitoneal adenopathy since 01/29/2018. A previously measured left periaortic lymph node measures 13 mm as compared with 11 mm previously. Diffuse increased size of previously noted periaortic lymph nodes in the region. Right pelvic sidewall mass also increased in size measuring 2.6 x 2.8 cm, as compared with 2.5 cm previously. Other small pelvic lymph nodes are noted Reproductive:  IUD.  Nabothian cyst in the cervix. Other: No free air. 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 Musculoskeletal: Degenerative changes of the lumbar spine. No acute or suspicious osseous lesion. Sacral nerve stimulator. IMPRESSION: 1. No CT evidence for acute intra-abdominal or pelvic abnormality. 2. Slight interval increase in retroperitoneal adenopathy and right pelvic sidewall mass since prior comparison CTs and concerning for disease progression. 3. Postsurgical changes at the rectum. 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 4. Status post left nephrectomy Electronically Signed   By: Donavan Foil M.D.   On: 04/24/2018 22:48   Procedures Procedures (including critical care time)  Medications Ordered in ED Medications - No data to display   Initial Impression / Assessment and Plan / ED Course  I have reviewed the triage vital signs and the nursing notes.  Pertinent labs & imaging results that were available during my care of the patient were reviewed by me and considered in my medical decision making (see chart for details).     57 year old female with increasing lower abdominal pain.  Unfortunately, I suspect this may be from enlarging node/meds.  No acute abnormality noted.  Went over pain medication.  She can increase for the time being.  She needs to discuss further with her oncologist.  Final  Clinical Impressions(s) / ED Diagnoses   Final diagnoses:  Lower abdominal pain    ED Discharge Orders    None       Virgel Manifold, MD 05/03/18 1025

## 2018-04-24 NOTE — ED Notes (Signed)
MD at beside

## 2018-04-24 NOTE — Telephone Encounter (Signed)
Verified appointment with patient, and she will get an updated calendar of her appointments at her next appointment Nov 6.

## 2018-04-25 ENCOUNTER — Inpatient Hospital Stay: Payer: 59

## 2018-04-25 ENCOUNTER — Inpatient Hospital Stay (HOSPITAL_BASED_OUTPATIENT_CLINIC_OR_DEPARTMENT_OTHER): Payer: 59 | Admitting: Medical

## 2018-04-25 VITALS — BP 121/70 | HR 89 | Temp 99.8°F | Resp 18

## 2018-04-25 DIAGNOSIS — R112 Nausea with vomiting, unspecified: Secondary | ICD-10-CM

## 2018-04-25 DIAGNOSIS — R1010 Upper abdominal pain, unspecified: Secondary | ICD-10-CM | POA: Diagnosis not present

## 2018-04-25 DIAGNOSIS — C20 Malignant neoplasm of rectum: Secondary | ICD-10-CM

## 2018-04-25 DIAGNOSIS — Z5111 Encounter for antineoplastic chemotherapy: Secondary | ICD-10-CM | POA: Diagnosis not present

## 2018-04-25 DIAGNOSIS — Z95828 Presence of other vascular implants and grafts: Secondary | ICD-10-CM

## 2018-04-25 MED ORDER — LORAZEPAM 2 MG/ML IJ SOLN
INTRAMUSCULAR | Status: AC
  Filled 2018-04-25: qty 1

## 2018-04-25 MED ORDER — DEXAMETHASONE SODIUM PHOSPHATE 10 MG/ML IJ SOLN
INTRAMUSCULAR | Status: AC
  Filled 2018-04-25: qty 1

## 2018-04-25 MED ORDER — SODIUM CHLORIDE 0.9% FLUSH
10.0000 mL | INTRAVENOUS | Status: DC | PRN
  Administered 2018-04-25: 10 mL
  Filled 2018-04-25: qty 10

## 2018-04-25 MED ORDER — SODIUM CHLORIDE 0.9 % IV SOLN: 10.0000 mg | Freq: Once | INTRAVENOUS | Status: DC

## 2018-04-25 MED ORDER — LORAZEPAM 2 MG/ML IJ SOLN
0.5000 mg | Freq: Once | INTRAMUSCULAR | Status: AC
  Administered 2018-04-25: 0.5 mg via INTRAVENOUS

## 2018-04-25 MED ORDER — DEXAMETHASONE SODIUM PHOSPHATE 10 MG/ML IJ SOLN
10.0000 mg | Freq: Once | INTRAMUSCULAR | Status: AC
  Administered 2018-04-25: 10 mg via INTRAVENOUS

## 2018-04-25 MED ORDER — HEPARIN SOD (PORK) LOCK FLUSH 100 UNIT/ML IV SOLN
500.0000 [IU] | Freq: Once | INTRAVENOUS | Status: AC | PRN
  Administered 2018-04-25: 500 [IU]
  Filled 2018-04-25: qty 5

## 2018-04-25 MED ORDER — LORAZEPAM 0.5 MG PO TABS: 0.5000 mg | ORAL_TABLET | Freq: Three times a day (TID) | ORAL | 1 refills | Status: DC | PRN

## 2018-04-25 MED ORDER — MORPHINE SULFATE (PF) 4 MG/ML IV SOLN
INTRAVENOUS | Status: AC
  Filled 2018-04-25: qty 1

## 2018-04-25 MED ORDER — SODIUM CHLORIDE 0.9% FLUSH
10.0000 mL | INTRAVENOUS | Status: DC | PRN
  Filled 2018-04-25: qty 10

## 2018-04-25 MED ORDER — MORPHINE SULFATE (PF) 4 MG/ML IV SOLN
2.0000 mg | Freq: Once | INTRAVENOUS | Status: AC
  Administered 2018-04-25: 2 mg via INTRAVENOUS

## 2018-04-25 MED ORDER — SODIUM CHLORIDE 0.9 % IV SOLN
INTRAVENOUS | Status: DC
  Administered 2018-04-25: 13:00:00 via INTRAVENOUS
  Filled 2018-04-25: qty 250

## 2018-04-25 MED ORDER — HEPARIN SOD (PORK) LOCK FLUSH 100 UNIT/ML IV SOLN
500.0000 [IU] | Freq: Once | INTRAVENOUS | Status: DC | PRN
  Filled 2018-04-25: qty 5

## 2018-04-25 NOTE — Progress Notes (Signed)
Pt presented today for Chemo infusion pump removal with nausea and vomiting. Patient is being seen by Sandi Mealy in symptoms management. Pump is being removed there.  Kasandra Knudsen LPN

## 2018-04-25 NOTE — Patient Instructions (Signed)

## 2018-04-25 NOTE — Progress Notes (Signed)
Pt presents for flush appt today with severe nausea and vomiting.  Pt taken to [facility] for evaluation and fluids.

## 2018-04-26 ENCOUNTER — Telehealth: Payer: Self-pay | Admitting: Medical

## 2018-04-26 NOTE — Telephone Encounter (Signed)
No los per 11/6 °

## 2018-04-30 ENCOUNTER — Other Ambulatory Visit: Payer: Self-pay | Admitting: *Deleted

## 2018-04-30 DIAGNOSIS — C20 Malignant neoplasm of rectum: Secondary | ICD-10-CM

## 2018-04-30 MED ORDER — DEXAMETHASONE 4 MG PO TABS: 4.0000 mg | ORAL_TABLET | Freq: Every day | ORAL | 0 refills | Status: DC

## 2018-04-30 NOTE — Progress Notes (Signed)
[facility] Progress Note   NORIAH OSGOOD [id number] [birth year] 57 y.o.  JERMYA DOWDING is managed by Dr. Dominica Severin B. Sherrill  Actively treated with chemotherapy/immunotherapy: yes  Current Therapy: FOLFOX  Last Treated: 04/23/2018 (cycle 1, day 1)  Assessment: Plan:    Intractable vomiting with nausea, unspecified vomiting type - Plan: LORazepam (ATIVAN) injection 0.5 mg, dexamethasone (DECADRON) injection 10 mg, LORazepam (ATIVAN) 0.5 MG tablet, 0.9 %  sodium chloride infusion, DISCONTINUED: dexamethasone (DECADRON) 10 mg in sodium chloride 0.9 % 50 mL IVPB  Pain of upper abdomen - Plan: morphine 4 MG/ML injection 2 mg  Port-A-Cath in place - Plan: heparin lock flush 100 unit/mL, sodium chloride flush (NS) 0.9 % injection 10 mL  Rectal cancer, 12 - 15 cm from anal verge   Nausea and vomiting with abdominal pain: [name] was seen in the emergency room last night.  Her labs overall were stable.  Her urine returned positive for ketones and protein.  The remainder of her labs are stable without any gross abnormalities.  A CT of the abdomen returned showing: 1. No CT evidence for acute intra-abdominal or pelvic abnormality. 2. Slight interval increase in retroperitoneal adenopathy and right pelvic sidewall mass since prior comparison CTs and concerning for disease progression. 3. Postsurgical changes at the rectum. 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 4. Status post left nephrectomy  She was given Ativan 0.5 mg sublingual x1 for her nausea today.  She was also given a prescription for the same.  I mention to her that low-dose dexamethasone could be added around the time of her chemotherapy should she continue to have poorly controlled nausea and vomiting.  Additionally a scopolamine patch could be considered.  The patient was given 2 mg of morphine IV for her abdominal pain.  She was  additionally given 1 L of normal saline IV.  Metastatic rectal cancer: The patient is status post cycle 1, day 1 of FOLFOX which was dosed on 04/23/2018.  Please see After Visit Summary for patient specific instructions.  Future Appointments  Date Time Provider [place]  05/09/2018  2:30 PM [facility] LAB 5 [facility] None  05/09/2018  2:45 PM [facility] [place] None  05/09/2018  3:15 PM Owens Shark, NP [facility] None  05/10/2018  7:45 AM [facility] INFUSION [facility] None  05/12/2018 11:15 AM [facility] [place] FLUSH [facility] None  05/24/2018 10:00 AM [facility] LAB 2 [facility] None  05/24/2018 10:15 AM [facility] [place] FLUSH [facility] None  05/24/2018 10:45 AM Owens Shark, NP [facility] None  05/24/2018 11:45 AM [facility] INFUSION [facility] None  05/26/2018  1:30 PM [facility] [place] FLUSH [facility] None    No orders of the defined types were placed in this encounter.      Subjective:   Patient ID:  LATIANA TOMEI is a 57 y.o. (DOB [birth year]) female.  Chief Complaint:  Chief Complaint  Patient presents with  . Nausea    HPI GAYNEL SCHAAFSMA is a 57 year old female with a history of a metastatic rectal cancer who is managed by Dr. Dominica Severin B. Sherrill.  She is status post cycle 1, day 1 of FOLFOX which was dosed on 04/23/2018.  She presented to the emergency room last evening for abdominal pains, fatigue, and nausea and vomiting.  Her labs were overall stable with no gross abnormalities.  A CT scan of the abdomen was completed which showed slight disease progression.  She continues to have nausea and vomiting today.  She is also having anorexia due to her nausea.  She has been using Compazine and Zofran without complete resolution of her nausea and vomiting.  Medications: I have reviewed the patient's current medications.  Allergies: No Known Allergies  Past Medical History:  Diagnosis Date  . Anxiety   . Arthritis 08-27-13   at present has  ruptured disc- lower back-not an issue now  . Blood infection ([place])    E Coli after UTI   . Colon cancer ([place]) 08-27-13   radiation /chemo -last ended 4 weeks(Dr. Benay Spice)  . DVT (deep venous thrombosis) ([place])    left upper extremity currently has stopped xarelto   . History of radiation therapy 06/18/13-07/26/13   rectal 50.4Gy total dose  . Hypertension    toxemia with pregnancy 1990  . IUD (intrauterine device) in place 08-27-13   Mirena Implant inplace  . Nerve damage    numbness in pelvic area from rectal rection straining to empty bladder   . PONV (postoperative nausea and vomiting)    scopalamine patch helped  . Stress incontinence   . UTI (lower urinary tract infection)    hx of     Past Surgical History:  Procedure Laterality Date  . APPENDECTOMY    . BACK SURGERY    . bladder study      pressure readings had approx 1.5 months ago   . BREAST SURGERY     reduction surgery  . CERVICAL DISC ARTHROPLASTY  01/03/2012   Procedure: CERVICAL ANTERIOR DISC ARTHROPLASTY;  Surgeon: Charlie Pitter, MD;  Location: [facility];  Service: Neurosurgery;  Laterality: N/A;  Cervical Anterior Disc Arthorplasty Five-Six  . CESAREAN SECTION     x2  . EUS N/A 05/31/2013   Procedure: LOWER ENDOSCOPIC ULTRASOUND (EUS);  Surgeon: Beryle Beams, MD;  Location: [facility];  Service: Endoscopy;  Laterality: N/A;  . ILEO LOOP COLOSTOMY CLOSURE N/A 05/02/2014   Procedure: LAPAROSCOPIC ASSISTED REVERSAL ILEOSTOMY AND LYSIS OF ADHESIONS;  Surgeon: [name], MD;  Location: [facility];  Service: General;  Laterality: N/A;  . INTRAUTERINE DEVICE INSERTION  2006   [place]  . IR FLUORO GUIDE PORT INSERTION RIGHT  08/25/2017  . IR US GUIDE VASC ACCESS RIGHT  08/25/2017  . KNEE ARTHROSCOPY Left 08-27-13   torn meniscus repair left   . LAPAROSCOPIC LOW ANTERIOR RESECTION N/A 09/02/2013   Procedure: LAPAROSCOPIC LOW ANTERIOR RESECTION;  Surgeon: [name], MD;  Location: [facility];  Service: General;   Laterality: N/A;  . LAPAROTOMY N/A 09/13/2013   Procedure: EXPLORATORY LAPAROTOMY / FLEXIBLE SIGMOIDOSCOPY/  DRAINAGE OF PELVIC ABSCESS WITH  DIVERTING  LOOP  ILEOSTOMY;  Surgeon: [name], MD;  Location: [facility];  Service: General;  Laterality: N/A;  . LYSIS OF ADHESION  05/02/2014   Procedure: LYSIS OF ADHESION;  Surgeon: [name], MD;  Location: [facility];  Service: General;;  . PORT-A-CATH REMOVAL Left 06/09/2014   Procedure: REMOVAL PORT-A-CATH  (MINOR PROCEDURE) ;  Surgeon: [name], MD;  Location: [facility];  Service: General;  Laterality: Left;  . PORTACATH PLACEMENT Left 10/11/2013   Procedure: INSERTION PORT-A-CATH;  Surgeon: [name], MD;  Location: [facility];  Service: General;  Laterality: Left;  . REFRACTIVE SURGERY      No family history on file.  Social History   Socioeconomic History  . Marital status: Married    Spouse name: Not on file  .  Number of children: Not on file  . Years of education: Not on file  . Highest education level: Not on file  Occupational History  . Not on file  Social Needs  . Financial resource strain: Not on file  . Food insecurity:    Worry: Not on file    Inability: Not on file  . Transportation needs:    Medical: Not on file    Non-medical: Not on file  Tobacco Use  . Smoking status: Never Smoker  . Smokeless tobacco: Never Used  Substance and Sexual Activity  . Alcohol use: Yes    Comment: weekends   . Drug use: No  . Sexual activity: Yes    Birth control/protection: IUD  Lifestyle  . Physical activity:    Days per week: Not on file    Minutes per session: Not on file  . Stress: Not on file  Relationships  . Social connections:    Talks on phone: Not on file    Gets together: Not on file    Attends religious service: Not on file    Active member of club or organization: Not on file    Attends meetings of clubs or organizations: Not on file    Relationship status: Not on file  . Intimate  partner violence:    Fear of current or ex partner: Not on file    Emotionally abused: Not on file    Physically abused: Not on file    Forced sexual activity: Not on file  Other Topics Concern  . Not on file  Social History Narrative  . Not on file    Past Medical History, Surgical history, Social history, and Family history were reviewed and updated as appropriate.   Please see review of systems for further details on the patient's review from today.   Review of Systems:  Review of Systems  Constitutional: Positive for appetite change. Negative for chills, diaphoresis and fever.  HENT: Negative for trouble swallowing.   Respiratory: Negative for cough, choking, shortness of breath and wheezing.   Cardiovascular: Negative for chest pain and palpitations.  Gastrointestinal: Positive for abdominal pain, nausea and vomiting. Negative for constipation and diarrhea.  Genitourinary: Negative for decreased urine volume.  Neurological: Negative for headaches.    Objective:   Physical Exam:  There were no vitals taken for this visit. ECOG: 0  Physical Exam  Constitutional: No distress.  HENT:  Head: Normocephalic and atraumatic.  Mouth/Throat: No oropharyngeal exudate.  Oral mucosa appears dry.  Neck: Normal range of motion. Neck supple.  Cardiovascular: Normal rate, regular rhythm and normal heart sounds. Exam reveals no gallop and no friction rub.  No murmur heard. Pulmonary/Chest: Effort normal and breath sounds normal. No respiratory distress. She has no wheezes. She has no rales.  Abdominal: Soft. Bowel sounds are normal. She exhibits no distension and no mass. There is no tenderness. There is no rebound and no guarding.  Lymphadenopathy:    She has no cervical adenopathy.  Neurological: She is alert. Coordination normal.  Skin: Skin is warm and dry. No rash noted. She is not diaphoretic. No erythema.  Psychiatric: She has a normal mood and affect. Her behavior is normal.  Judgment and thought content normal.    Lab Review:     Component Value Date/Time   NA 140 04/24/2018 2102   NA 138 11/08/2016 1205   K 3.6 04/24/2018 2102   K 4.2 11/08/2016 1205   CL 102 04/24/2018 2102  CO2 25 04/24/2018 2102   CO2 26 11/08/2016 1205   GLUCOSE 136 (H) 04/24/2018 2102   GLUCOSE 90 11/08/2016 1205   BUN 15 04/24/2018 2102   BUN 23.0 11/08/2016 1205   CREATININE 1.07 (H) 04/24/2018 2102   CREATININE 1.00 04/23/2018 0854   CREATININE 1.0 11/08/2016 1205   CALCIUM 9.6 04/24/2018 2102   CALCIUM 9.6 11/08/2016 1205   PROT 7.6 04/24/2018 2102   PROT 6.5 06/02/2014 1004   ALBUMIN 4.0 04/24/2018 2102   ALBUMIN 3.4 (L) 06/02/2014 1004   AST 22 04/24/2018 2102   AST 13 (L) 04/23/2018 0854   AST 16 06/02/2014 1004   ALT 14 04/24/2018 2102   ALT 10 04/23/2018 0854   ALT 18 06/02/2014 1004   ALKPHOS 50 04/24/2018 2102   ALKPHOS 69 06/02/2014 1004   BILITOT 0.4 04/24/2018 2102   BILITOT 0.3 04/23/2018 0854   BILITOT 0.36 06/02/2014 1004   GFRNONAA 57 (L) 04/24/2018 2102   GFRNONAA >60 04/23/2018 0854   GFRAA >60 04/24/2018 2102   GFRAA >60 04/23/2018 0854       Component Value Date/Time   WBC 4.9 04/24/2018 2102   RBC 4.42 04/24/2018 2102   HGB 13.4 04/24/2018 2102   HGB 12.5 04/23/2018 0854   HGB 14.2 06/16/2015 0816   HCT 42.0 04/24/2018 2102   HCT 42.6 06/16/2015 0816   PLT 253 04/24/2018 2102   PLT 283 04/23/2018 0854   PLT 169 06/16/2015 0816   MCV 95.0 04/24/2018 2102   MCV 92.1 06/16/2015 0816   MCH 30.3 04/24/2018 2102   MCHC 31.9 04/24/2018 2102   RDW 11.5 04/24/2018 2102   RDW 12.3 06/16/2015 0816   LYMPHSABS 0.6 (L) 04/23/2018 0854   LYMPHSABS 0.6 (L) 06/16/2015 0816   MONOABS 0.4 04/23/2018 0854   MONOABS 0.3 06/16/2015 0816   EOSABS 0.2 04/23/2018 0854   EOSABS 0.1 06/16/2015 0816   BASOSABS 0.0 04/23/2018 0854   BASOSABS 0.0 06/16/2015 0816   -------------------------------  Imaging from last 24 hours (if  applicable):  Radiology interpretation: Ct Abdomen Pelvis W Contrast  Result Date: 04/24/2018 CLINICAL DATA:  Rectal cancer pain and nausea EXAM: CT ABDOMEN AND PELVIS WITH CONTRAST TECHNIQUE: Multidetector CT imaging of the abdomen and pelvis was performed using the standard protocol following bolus administration of intravenous contrast. CONTRAST:  100 mL Isovue-300 intravenous COMPARISON:  CT 07/05/2017, 05/06/2016, 06/16/2015, 03/16/2018, 01/29/2018 FINDINGS: Lower chest: Lung bases demonstrate dependent atelectasis. No pleural effusion. The heart size is normal Hepatobiliary: Subcentimeter hypodensity in the peripheral right hepatic lobe, no change. No new liver mass. Focal fat infiltration near the falciform ligament. No calcified gallstone or biliary dilatation Pancreas: Unremarkable. No pancreatic ductal dilatation or surrounding inflammatory changes. Spleen: Normal in size without focal abnormality. Adrenals/Urinary Tract: Adrenal glands are normal. Status post left nephrectomy. Right kidney shows no hydronephrosis. The bladder is normal Stomach/Bowel: Stomach nonenlarged. No dilated small bowel. No colon wall thickening. Postsurgical changes at the rectum. Vascular/Lymphatic: Nonaneurysmal aorta. Slight interval increase in retroperitoneal adenopathy since 01/29/2018. A previously measured left periaortic lymph node measures 13 mm as compared with 11 mm previously. Diffuse increased size of previously noted periaortic lymph nodes in the region. Right pelvic sidewall mass also increased in size measuring 2.6 x 2.8 cm, as compared with 2.5 cm previously. Other small pelvic lymph nodes are noted Reproductive: IUD.  Nabothian cyst in the cervix. Other: No free air. Presacral soft tissues stranding with small extraluminal gas bubbles on the right side and midline  which may reflect small fistula, this is a chronic finding. Musculoskeletal: Degenerative changes of the lumbar spine. No acute or suspicious  osseous lesion. Sacral nerve stimulator. IMPRESSION: 1. No CT evidence for acute intra-abdominal or pelvic abnormality. 2. Slight interval increase in retroperitoneal adenopathy and right pelvic sidewall mass since prior comparison CTs and concerning for disease progression. 3. Postsurgical changes at the rectum. 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 4. Status post left nephrectomy Electronically Signed   By: Donavan Foil M.D.   On: 04/24/2018 22:48

## 2018-05-06 ENCOUNTER — Other Ambulatory Visit: Payer: Self-pay | Admitting: Oncology

## 2018-05-09 ENCOUNTER — Inpatient Hospital Stay: Payer: 59

## 2018-05-09 ENCOUNTER — Encounter: Payer: Self-pay | Admitting: Nurse Practitioner

## 2018-05-09 ENCOUNTER — Inpatient Hospital Stay (HOSPITAL_BASED_OUTPATIENT_CLINIC_OR_DEPARTMENT_OTHER): Payer: 59 | Admitting: Nurse Practitioner

## 2018-05-09 VITALS — BP 122/86 | HR 87 | Temp 98.5°F | Resp 18 | Ht 64.0 in | Wt 157.9 lb

## 2018-05-09 DIAGNOSIS — N390 Urinary tract infection, site not specified: Secondary | ICD-10-CM

## 2018-05-09 DIAGNOSIS — C20 Malignant neoplasm of rectum: Secondary | ICD-10-CM | POA: Diagnosis not present

## 2018-05-09 DIAGNOSIS — R319 Hematuria, unspecified: Secondary | ICD-10-CM

## 2018-05-09 DIAGNOSIS — Z95828 Presence of other vascular implants and grafts: Secondary | ICD-10-CM

## 2018-05-09 DIAGNOSIS — Z5111 Encounter for antineoplastic chemotherapy: Secondary | ICD-10-CM | POA: Diagnosis not present

## 2018-05-09 LAB — CBC WITH DIFFERENTIAL (CANCER CENTER ONLY)
Abs Immature Granulocytes: 0.01 10*3/uL (ref 0.00–0.07)
Basophils Absolute: 0 10*3/uL (ref 0.0–0.1)
Basophils Relative: 0 %
EOS ABS: 0.1 10*3/uL (ref 0.0–0.5)
Eosinophils Relative: 2 %
HEMATOCRIT: 37 % (ref 36.0–46.0)
Hemoglobin: 12.1 g/dL (ref 12.0–15.0)
Immature Granulocytes: 0 %
LYMPHS ABS: 0.4 10*3/uL — AB (ref 0.7–4.0)
Lymphocytes Relative: 8 %
MCH: 30.9 pg (ref 26.0–34.0)
MCHC: 32.7 g/dL (ref 30.0–36.0)
MCV: 94.6 fL (ref 80.0–100.0)
MONOS PCT: 10 %
Monocytes Absolute: 0.5 10*3/uL (ref 0.1–1.0)
Neutro Abs: 4.2 10*3/uL (ref 1.7–7.7)
Neutrophils Relative %: 80 %
Platelet Count: 196 10*3/uL (ref 150–400)
RBC: 3.91 MIL/uL (ref 3.87–5.11)
RDW: 12 % (ref 11.5–15.5)
WBC Count: 5.3 10*3/uL (ref 4.0–10.5)
nRBC: 0 % (ref 0.0–0.2)

## 2018-05-09 LAB — CMP (CANCER CENTER ONLY)
ALT: 11 U/L (ref 0–44)
ANION GAP: 9 (ref 5–15)
AST: 14 U/L — AB (ref 15–41)
Albumin: 3.5 g/dL (ref 3.5–5.0)
Alkaline Phosphatase: 62 U/L (ref 38–126)
BILIRUBIN TOTAL: 0.4 mg/dL (ref 0.3–1.2)
BUN: 15 mg/dL (ref 6–20)
CALCIUM: 9.6 mg/dL (ref 8.9–10.3)
CO2: 24 mmol/L (ref 22–32)
Chloride: 108 mmol/L (ref 98–111)
Creatinine: 1 mg/dL (ref 0.44–1.00)
GFR, Est AFR Am: >60 mL/min (ref >60)
Glucose, Bld: 122 mg/dL — ABNORMAL HIGH (ref 70–99)
POTASSIUM: 3.8 mmol/L (ref 3.5–5.1)
Sodium: 141 mmol/L (ref 135–145)
TOTAL PROTEIN: 7.3 g/dL (ref 6.5–8.1)

## 2018-05-09 MED ORDER — HEPARIN SOD (PORK) LOCK FLUSH 100 UNIT/ML IV SOLN
500.0000 [IU] | Freq: Once | INTRAVENOUS | Status: AC | PRN
  Administered 2018-05-09: 500 [IU]
  Filled 2018-05-09: qty 5

## 2018-05-09 MED ORDER — HYDROCODONE-ACETAMINOPHEN 10-325 MG PO TABS: 1.0000 | ORAL_TABLET | Freq: Four times a day (QID) | ORAL | 0 refills | Status: DC | PRN

## 2018-05-09 MED ORDER — OXYCODONE HCL ER 10 MG PO T12A: 10.0000 mg | EXTENDED_RELEASE_TABLET | Freq: Two times a day (BID) | ORAL | 0 refills | Status: DC

## 2018-05-09 MED ORDER — SODIUM CHLORIDE 0.9% FLUSH
10.0000 mL | INTRAVENOUS | Status: DC | PRN
  Administered 2018-05-09: 10 mL
  Filled 2018-05-09: qty 10

## 2018-05-09 NOTE — Progress Notes (Addendum)
[facility] OFFICE PROGRESS NOTE   Diagnosis: Rectal cancer  INTERVAL HISTORY:   [name] returns as scheduled.  She completed cycle 1 FOLFOX 04/23/2018.  She was seen in the emergency department 04/24/2018 with abdominal pain and nausea.  CT abdomen/pelvis showed slight increase in retroperitoneal adenopathy and right pelvic sidewall mass.  She was seen in the [facility] on 04/25/2018 for follow-up of the same.  She was given Ativan for the nausea and morphine for the abdominal pain.  She also received 1 L of normal saline.  She continues to have low abdominal pain.  She takes 2 hydrocodone and 1 Dilaudid about every 4-5 hours.  No mouth sores.  She notes perioral dry skin.  2 days after chemotherapy she had nausea/vomiting and diarrhea.  Cold sensitivity lasted about 3 days.  No persistent neuropathy symptoms.  She notes some balance problems.  She thinks this is due to nerve issues with the right leg.  No unusual headaches or vision change.  Her husband notes she is having problems with fine motor skills, dexterity.  Objective:  Vital signs in last 24 hours:  Blood pressure 122/86, pulse 87, temperature 98.5 F (36.9 C), temperature source Oral, resp. rate 18, height _0  (1.626 m), weight 157 lb 14.4 oz (71.6 kg), SpO2 99 %.    HEENT: No thrush or ulcers. Resp: Lungs clear bilaterally. Cardio: Regular rate and rhythm. GI: Abdomen soft and nontender.  No hepatomegaly. Vascular: No leg edema. Neuro: Alert, mildly confused.  Follows commands.  Right leg is mildly weak.  Knee DTRs 2+, symmetric.  Port-A-Cath without erythema.  Lab Results:  Lab Results  Component Value Date   WBC 5.3 05/09/2018   HGB 12.1 05/09/2018   HCT 37.0 05/09/2018   MCV 94.6 05/09/2018   PLT 196 05/09/2018   NEUTROABS 4.2 05/09/2018    Imaging:  No results found.  Medications: I have reviewed the patient's current medications.  Assessment/Plan: 1. Rectal cancer,  clinical stage III (T3 N1) status post biopsy of a rectal mass on 05/24/2013 confirming adenocarcinoma. Endoscopic ultrasound 05/31/2013 measured the mass at 11 cm from the anal verge, [id number].  Initiation of radiation and concurrent Xeloda 06/18/2013; completion 07/26/2013.   Status post low anterior resection 09/02/2013. Invasive adenocarcinoma, 3.7 cm, negative margins; metastatic carcinoma in 7 of 11 lymph nodes; extensive lymph vascular involvement by tumor.MSI-stable, no loss of mismatch repair protein expression  Cycle 1 adjuvant FOLFOX 10/15/2013.   Cycle 2 adjuvant FOLFOX 10/29/2013.   Cycle 3 adjuvant FOLFOX 11/14/2013.   Cycle 4 adjuvant FOLFOX 11/27/2013.   Cycle 5 adjuvant FOLFOX 12/10/2013   Cycle 6 adjuvant FOLFOX 12/24/2013.   Cycle 7 adjuvant FOLFOX 01/14/2014.   Cycle 8 adjuvant FOLFOX 01/28/2014   Cycle 9 adjuvant FOLFOX 02/11/2014 (oxaliplatin dose reduced secondary to thrombocytopenia)   Surveillance CT scans 06/02/2014 with no evidence of recurrent rectal cancer  surveillance CT scans 06/16/2015 with no evidence of recurrent rectal cancer, 10 mm right external iliac node , increased amount of loculated air associated with the presacral soft tissue thickening , new left hydronephrosis  Surveillance CT scans 05/06/2016-negative for recurrent rectal cancer, decreased right external iliac node, persistent left hydronephrosis  CT abdomen/pelvis 07/05/2017-new left para-aortic, aortocaval, right common iliac and right external iliac lymphadenopathy.  CT neck/chest 07/13/2017-enlarged left supraclavicular nodes, new paratracheal nodes  Ultrasound-guided biopsy of a left supraclavicular lymph node 07/25/2017-metastatic adenocarcinoma consistent with colorectal cancer,MSI-stable, tumor mutation burden-10, K-ras S39mtation  Cycle 1 FOLFIRI 08/28/2017  Cycle 2 FOLFIRI 09/11/2017  Cycle 3 FOLFIRI 09/25/2017  Cycle 4 FOLFIRI 10/09/2017  Cycle 5 FOLFIRI  10/23/2017  CTs 11/14/2017-slight decrease in left supraclavicular adenopathy, slight increase and decrease in mediastinal/retroperitoneal/pelvic nodes, no new disease  Cycle 6 FOLFIRI 11/14/2017  Cycle 7 FOLFIRI 11/27/2017  Cycle 8 FOLFIRI 12/11/2017  Cycle 9 FOLFIRI 01/03/2018  Cycle 10 FOLFIRI 01/18/2018  Restaging CTs 01/29/2018-stable thoracic adenopathywith mildly improved abdominal adenopathy. Similar appearance of presacral soft tissue density. Suspected small fistula from the posterior margin of the rectum or anastomosis to the presacral region.  Cycle 11 FOLFIRI 01/31/2018  Cycle 12 FOLFIRI 02/20/2018  CT pelvis 03/16/2018-mild progression of pelvic lymphadenopathy with compression of the right iliac vein by an enlarged right obturator node  Cycle 1 FOLFOX 04/23/2018  CT emergency department 04/24/2018- slight interval increase in retroperitoneal adenopathy and right pelvic sidewall mass.  Cycle 2 FOLFOX 05/10/2018 2. Post colonoscopy bowel perforation confirmed on abdomen CT 05/28/2013. 3. Single indeterminate liver lesion on the abdomen CT 05/28/2013. MRI on 06/18/2013 confirmed 2 tiny liver lesions, indeterminate-favored to be benign cysts. 4. History of cervical and lumbar disc disease.  5. History of HELLP syndrome with first pregnancy.  6. Delayed nausea following cycle 1 FOLFOX. She receives Aloxi and Emend. She continues to experience delayed nausea. Prophylactic Decadron added with cycle 4. 7. History of pain with bowel movements. 8. Anastomotic leak on CT 09/13/2013. Status post exploratory laparotomy with drainage of a pelvic abscess, diverting loop ileostomy. Drainage catheter removed 10/23/2013.  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 Perineal numbness, urinary retention. The urinary retention has improved. 10. Perineal pain-improved. 11. Neutropenia  secondary chemotherapy-she received Neulasta. 12. Urinary tract infection, Escherichia coli-11/03/2013. 13. Venous engorgement at the left anterior chest and left arm with swelling of the left arm and hand. Venous Doppler 11/14/2013 positive for DVT. She began Lovenox 11/14/2013 and was switched to xarelto 12/10/2013. Xarelto discontinued early January 2016. 14. Fever 11/20/2013. Blood and urine cultures negative. The fever resolved within 24 hours. She continued to have a single episode of fever following chemotherapy 15. History of a herpetic lip lesion 16. 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 17. Escherichia coli bacteremia, urinary source, 01/14/2014. She completed a 10 day course of ceftriaxone. 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 Anxiety/depression. 20. Port-A-Cath removal 06/09/2014. 21. Escherichia coli urinary tract infection 06/24/2014. She completed a course of ciprofloxacin. 22. frequent bowel movements following surgery/radiation -status post placement of a sacral stimulator 11/21/2014 23. CT 06/16/2015 with new left hydronephrosis status post cystoscopy with placement of a double-J stent 07/17/2015. No tumors or stones were seen in the bladder. Both ureteral orifices were unremarkable. On the left side retrograde study was performed. The distal ureter was narrowed and then a little bit more dilated proximally with hydronephrosis noted. There was no evidence of intramural tumor but the area did seem a bit strictured.  Stent removed, persistent hydronephrosis, followed by Dr. Amalia Hailey  CT 05/06/2016-increased left hydronephrosis  Ultrasound 09/09/2016-stable left hydroureteronephrosis  Left nephrectomy 04/28/2017  24.Compression of right iliac vein on CT 03/16/2018-placed on Xarelto anticoagulation    Disposition: Ms. Westra appears stable.  She has completed 1 cycle of FOLFOX.  Plan to proceed with cycle 2 as  scheduled 05/10/2018.  She experienced significant nausea following cycle 1.  She will take dexamethasone 4 mg twice daily for 3 days beginning on the day of pump discontinuation.  She will receive additional IV fluids on the day of pump discontinuation.  For the pain she will begin OxyContin 10 mg every 12 hours.  A new prescription was sent to her pharmacy for hydrocodone 10/325 1 tablet every 4 hours as needed.  She also has Dilaudid to take as needed.  She understands she should not be driving while taking pain medication.  The balance issues may be related to nerve compression from the adenopathy.  As noted above she will return for cycle 2 FOLFOX 05/10/2018.  She will return for lab, follow-up and cycle 3 FOLFOX 05/24/2018.  She will contact the office in the interim with any problems.  Patient seen with Dr. Benay Spice.  25 minutes were spent face-to-face at today's visit with the majority of that time involved in counseling/coordination of care.    Ned Card ANP/GNP-BC   05/09/2018  3:54 PM This was a shared visit with Ned Card.  Ms. Cieslewicz interviewed and examined.  The pain is most likely related to tumor in the right pelvis.  We adjusted the narcotic pain regimen.  She will take Decadron prophylaxis for nausea following the cycle of chemotherapy.  We discussed the Foundation 1 testing and other treatment options.  I discussed the case with Dr. Jimmy Footman after he saw her last week.  Renee Manson, MD

## 2018-05-09 NOTE — Patient Instructions (Signed)
Take decadron 4 mg twice a day for 3 days beginning the day the pump is removed

## 2018-05-10 ENCOUNTER — Ambulatory Visit (HOSPITAL_COMMUNITY)
Admission: RE | Admit: 2018-05-10 | Discharge: 2018-05-10 | Disposition: A | Discharge status: Home / Self Care | Payer: 59 | Source: Ambulatory Visit | Attending: Nurse Practitioner | Admitting: Nurse Practitioner

## 2018-05-10 ENCOUNTER — Inpatient Hospital Stay (HOSPITAL_BASED_OUTPATIENT_CLINIC_OR_DEPARTMENT_OTHER): Payer: 59 | Admitting: Nurse Practitioner

## 2018-05-10 ENCOUNTER — Inpatient Hospital Stay: Payer: 59

## 2018-05-10 ENCOUNTER — Other Ambulatory Visit: Payer: Self-pay | Admitting: Nurse Practitioner

## 2018-05-10 VITALS — BP 134/74 | HR 102 | Temp 99.1°F | Resp 18

## 2018-05-10 DIAGNOSIS — R42 Dizziness and giddiness: Secondary | ICD-10-CM | POA: Diagnosis present

## 2018-05-10 DIAGNOSIS — C7931 Secondary malignant neoplasm of brain: Secondary | ICD-10-CM | POA: Insufficient documentation

## 2018-05-10 DIAGNOSIS — C20 Malignant neoplasm of rectum: Secondary | ICD-10-CM

## 2018-05-10 DIAGNOSIS — Z5111 Encounter for antineoplastic chemotherapy: Secondary | ICD-10-CM | POA: Diagnosis not present

## 2018-05-10 DIAGNOSIS — R531 Weakness: Secondary | ICD-10-CM | POA: Insufficient documentation

## 2018-05-10 MED ORDER — SODIUM CHLORIDE 0.9 % IV SOLN
2400.0000 mg/m2 | INTRAVENOUS | Status: DC
  Administered 2018-05-10: 4300 mg via INTRAVENOUS
  Filled 2018-05-10: qty 86

## 2018-05-10 MED ORDER — IOHEXOL 300 MG/ML  SOLN
75.0000 mL | Freq: Once | INTRAMUSCULAR | Status: AC | PRN
  Administered 2018-05-10: 75 mL via INTRAVENOUS

## 2018-05-10 MED ORDER — SODIUM CHLORIDE (PF) 0.9 % IJ SOLN
INTRAMUSCULAR | Status: AC
  Filled 2018-05-10: qty 50

## 2018-05-10 MED ORDER — OXALIPLATIN CHEMO INJECTION 100 MG/20ML
84.0000 mg/m2 | Freq: Once | INTRAVENOUS | Status: AC
  Administered 2018-05-10: 150 mg via INTRAVENOUS
  Filled 2018-05-10: qty 10

## 2018-05-10 MED ORDER — PALONOSETRON HCL INJECTION 0.25 MG/5ML
0.2500 mg | Freq: Once | INTRAVENOUS | Status: AC
  Administered 2018-05-10: 0.25 mg via INTRAVENOUS

## 2018-05-10 MED ORDER — PALONOSETRON HCL INJECTION 0.25 MG/5ML
INTRAVENOUS | Status: AC
  Filled 2018-05-10: qty 5

## 2018-05-10 MED ORDER — DEXTROSE 5 % IV SOLN
Freq: Once | INTRAVENOUS | Status: AC
  Administered 2018-05-10: 09:00:00 via INTRAVENOUS
  Filled 2018-05-10: qty 250

## 2018-05-10 MED ORDER — DEXAMETHASONE SODIUM PHOSPHATE 10 MG/ML IJ SOLN
INTRAMUSCULAR | Status: AC
  Filled 2018-05-10: qty 1

## 2018-05-10 MED ORDER — SODIUM CHLORIDE 0.9% FLUSH
10.0000 mL | INTRAVENOUS | Status: DC | PRN
  Administered 2018-05-10: 10 mL
  Filled 2018-05-10: qty 10

## 2018-05-10 MED ORDER — FLUOROURACIL CHEMO INJECTION 2.5 GM/50ML
400.0000 mg/m2 | Freq: Once | INTRAVENOUS | Status: AC
  Administered 2018-05-10: 700 mg via INTRAVENOUS
  Filled 2018-05-10: qty 14

## 2018-05-10 MED ORDER — DEXAMETHASONE 4 MG PO TABS: 8.0000 mg | ORAL_TABLET | Freq: Two times a day (BID) | ORAL | 0 refills | Status: DC

## 2018-05-10 MED ORDER — DEXAMETHASONE SODIUM PHOSPHATE 10 MG/ML IJ SOLN
10.0000 mg | Freq: Once | INTRAMUSCULAR | Status: AC
  Administered 2018-05-10: 10 mg via INTRAVENOUS

## 2018-05-10 MED ORDER — LEUCOVORIN CALCIUM INJECTION 350 MG
400.0000 mg/m2 | Freq: Once | INTRAVENOUS | Status: AC
  Administered 2018-05-10: 720 mg via INTRAVENOUS
  Filled 2018-05-10: qty 36

## 2018-05-10 NOTE — Progress Notes (Signed)
Pt complaint of worsening balance from yesterday when she saw Dr. Benay Spice.  Pt states she "fell last because her right leg felt numb".  When she tries to eat "she can't get the spoon to her mouth" and when she sits down "it feels like she is falling to the side and is dizzy".  Pt husband states that last night her memory was more impaired and the pt could not remember the year.  Dr. Benay Spice notified and stated he will have Ned Card, NP come to assess pt.   Per Ned Card, NP pt to get oxaliplatin and leucovorin infusion and once complete pt will go to radiology to have a stat CT and then return back to infusion to have home 5 FU pump placed.  Pt will return on Saturday to have IV fluids and per pharmacy, can be infused with 5FU pump going.   Per Ned Card, NP okay to D/C 5FU pump whenever pt is finished receiving IV fluids on Saturday 05/12/2018

## 2018-05-10 NOTE — Progress Notes (Addendum)
[facility] OFFICE PROGRESS NOTE   Diagnosis: Rectal cancer  INTERVAL HISTORY:   Ms. Lasorsa is seen in an unscheduled visit while she is here for cycle 2 FOLFOX.  She reports progressive right leg weakness.  She has also noted bilateral upper extremity weakness right side greater than left.  She has dropped multiple items.  Her husband notes she seems more confused.  She fell recently.  Objective:  Vital signs in last 24 hours:  Temperature 99.1, heart rate 102, respirations 18, blood pressure 134/74   Vascular: No leg edema. Neuro: Alert.  Follows commands.  She seems mildly confused.  Upper extremity motor strength 5/5. Port-A-Cath without erythema.   Lab Results:  Lab Results  Component Value Date   WBC 5.3 05/09/2018   HGB 12.1 05/09/2018   HCT 37.0 05/09/2018   MCV 94.6 05/09/2018   PLT 196 05/09/2018   NEUTROABS 4.2 05/09/2018    Imaging:  Ct Head W Wo Contrast  Result Date: 05/10/2018 CLINICAL DATA:  Gait imbalance since yesterday, fell. Weakness and memory loss. History of rectal cancer, on anticoagulation. EXAM: CT HEAD WITHOUT AND WITH CONTRAST TECHNIQUE: Contiguous axial images were obtained from the base of the skull through the vertex without and with intravenous contrast CONTRAST:  40m OMNIPAQUE IOHEXOL 300 MG/ML  SOLN COMPARISON:  None. FINDINGS: BRAIN: Heterogeneously enhancing 2.4 x 2.5 x 3.3 cm LEFT parietal mass at gray-white matter junction with extensive LEFT frontoparietal vasogenic edema resulting in 6 mm LEFT-to-RIGHT midline shift. Mildly enlarged temporal horns equivocal for early entrapment. No intraparenchymal hemorrhage or acute large vascular territory infarcts. No abnormal extra-axial fluid collections. VASCULAR: Unremarkable. SKULL/SOFT TISSUES: No skull fracture. No significant soft tissue swelling. ORBITS/SINUSES: The included ocular globes and orbital contents are normal.Trace paranasal sinus mucosal thickening. Mastoid air  cells are well aerated. OTHER: None. IMPRESSION: 1. 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 Please note, MRI of the brain with contrast better demonstrates smaller metastasis. 2. Mildly enlarged temporal horns, possible early entrapment. 3. Critical Value/emergent results were called by telephone at the time of interpretation on 05/10/2018 at 1:50 pm to Dr. LNed Card, who verbally acknowledged these results. Electronically Signed   By: CElon AlasM.D.   On: 05/10/2018 13:51    Medications: I have reviewed the patient's current medications.  Assessment/Plan: 1. Rectal cancer, clinical stage III (T3 N1) status post biopsy of a rectal mass on 05/24/2013 confirming adenocarcinoma. Endoscopic ultrasound 05/31/2013 measured the mass at 11 cm from the anal verge, uT3uN1.  Initiation of radiation and concurrent Xeloda 06/18/2013; completion 07/26/2013.   Status post low anterior resection 09/02/2013. Invasive adenocarcinoma, 3.7 cm, negative margins; metastatic carcinoma in 7 of 11 lymph nodes; extensive lymph vascular involvement by tumor.MSI-stable, no loss of mismatch repair protein expression  Cycle 1 adjuvant FOLFOX 10/15/2013.   Cycle 2 adjuvant FOLFOX 10/29/2013.   Cycle 3 adjuvant FOLFOX 11/14/2013.   Cycle 4 adjuvant FOLFOX 11/27/2013.   Cycle 5 adjuvant FOLFOX 12/10/2013   Cycle 6 adjuvant FOLFOX 12/24/2013.   Cycle 7 adjuvant FOLFOX 01/14/2014.   Cycle 8 adjuvant FOLFOX 01/28/2014   Cycle 9 adjuvant FOLFOX 02/11/2014 (oxaliplatin dose reduced secondary to thrombocytopenia)   Surveillance CT scans 06/02/2014 with no evidence of recurrent rectal cancer  surveillance CT scans 06/16/2015 with no evidence of recurrent rectal cancer, 10 mm right external iliac node , increased amount of loculated air associated with the presacral  soft tissue thickening , new left  hydronephrosis  Surveillance CT scans 05/06/2016-negative for recurrent rectal cancer, decreased right external iliac node, persistent left hydronephrosis  CT abdomen/pelvis 07/05/2017-new left para-aortic, aortocaval, right common iliac and right external iliac lymphadenopathy.  CT neck/chest 07/13/2017-enlarged left supraclavicular nodes, new paratracheal nodes  Ultrasound-guided biopsy of a left supraclavicular lymph node 07/25/2017-metastatic adenocarcinoma consistent with colorectal cancer,MSI-stable, tumor mutation burden-10, K-ras S69mtation  Cycle 1 FOLFIRI 08/28/2017  Cycle 2 FOLFIRI 09/11/2017  Cycle 3 FOLFIRI 09/25/2017  Cycle 4 FOLFIRI 10/09/2017  Cycle 5 FOLFIRI 10/23/2017  CTs 11/14/2017-slight decrease in left supraclavicular adenopathy, slight increase and decrease in mediastinal/retroperitoneal/pelvic nodes, no new disease  Cycle 6 FOLFIRI 11/14/2017  Cycle 7 FOLFIRI 11/27/2017  Cycle 8 FOLFIRI 12/11/2017  Cycle 9 FOLFIRI 01/03/2018  Cycle 10 FOLFIRI 01/18/2018  Restaging CTs 01/29/2018-stable thoracic adenopathywith mildly improved abdominal adenopathy. Similar appearance of presacral soft tissue density. Suspected small fistula from the posterior margin of the rectum or anastomosis to the presacral region.  Cycle 11 FOLFIRI 01/31/2018  Cycle 12 FOLFIRI 02/20/2018  CT pelvis 03/16/2018-mild progression of pelvic lymphadenopathy with compression of the right iliac vein by an enlarged right obturator node  Cycle 1 FOLFOX 04/23/2018  CT emergency department 04/24/2018- slight interval increase in retroperitoneal adenopathy and right pelvic sidewall mass.  Cycle 2 FOLFOX 05/10/2018 2. Post colonoscopy bowel perforation confirmed on abdomen CT 05/28/2013. 3. Single indeterminate liver lesion on the abdomen CT 05/28/2013. MRI on 06/18/2013 confirmed 2 tiny liver lesions, indeterminate-favored to be benign cysts. 4. History of cervical and lumbar disc disease.  5. History  of HELLP syndrome with first pregnancy.  6. Delayed nausea following cycle 1 FOLFOX. She receives Aloxi and Emend. She continues to experience delayed nausea. Prophylactic Decadron added with cycle 4. 7. History of pain with bowel movements. 8. Anastomotic leak on CT 09/13/2013. Status post exploratory laparotomy with drainage of a pelvic abscess, diverting loop ileostomy. Drainage catheter removed 10/23/2013.  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 Perineal numbness, urinary retention. The urinary retention has improved. 10. Perineal pain-improved. 11. Neutropenia secondary chemotherapy-she received Neulasta. 12. Urinary tract infection, Escherichia coli-11/03/2013. 13. Venous engorgement at the left anterior chest and left arm with swelling of the left arm and hand. Venous Doppler 11/14/2013 positive for DVT. She began Lovenox 11/14/2013 and was switched to xarelto 12/10/2013. Xarelto discontinued early January 2016. 14. Fever 11/20/2013. Blood and urine cultures negative. The fever resolved within 24 hours. She continued to have a single episode of fever following chemotherapy 15. History of a herpetic lip lesion 16. 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 17. Escherichia coli bacteremia, urinary source, 01/14/2014. She completed a 10 day course of ceftriaxone. 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 Anxiety/depression. 20. Port-A-Cath removal 06/09/2014. 21. Escherichia coli urinary tract infection 06/24/2014. She completed a course of ciprofloxacin. 22. frequent bowel movements following surgery/radiation -status post placement of a sacral stimulator 11/21/2014 23. CT 06/16/2015 with new left hydronephrosis status post cystoscopy with placement of a double-J stent 07/17/2015. No tumors or stones were seen in the bladder.  Both ureteral orifices were unremarkable. On the left side retrograde study was performed. The distal ureter was narrowed and then a little bit more dilated proximally with hydronephrosis noted. There was no evidence of intramural tumor but the area did seem a bit strictured.  Stent removed, persistent hydronephrosis, followed by Dr. EAmalia Hailey CT 05/06/2016-increased left hydronephrosis  Ultrasound 09/09/2016-stable left hydroureteronephrosis  Left nephrectomy 04/28/2017  24.Compression of right iliac vein on CT 03/16/2018-placed on Xarelto anticoagulation   Disposition: Ms. Schnieders reports progressive right leg weakness, balance problems, confusion and now upper extremity weakness since we saw her yesterday.  Cycle 2 FOLFOX was proceeded with as planned.  She was referred for a brain CT which showed a left parietal metastasis with extensive vasogenic edema.  Dr. Benay Spice reviewed the CT results/images with Ms. Giovanetti and her husband.  She received dexamethasone 10 mg IV as a premed for the chemotherapy.  She will take dexamethasone 8 mg by mouth this evening and then continue twice daily beginning 05/11/2018.  Dr. Benay Spice has spoken with radiation oncology.  She will be referred for an MRI and appointment with radiation oncology as soon as possible.  We will see her back as scheduled 05/24/2018, sooner if needed.  Patient seen with Dr. Benay Spice.    Ned Card ANP/GNP-BC   05/10/2018  4:21 PM This was a shared visit with Ned Card.  Ms. Charlesetta Ivory has developed progressive ataxia and right-sided weakness.  A CT confirms a left brain metastasis with edema.  We reviewed the CT images and discussed treatment options with Ms. Henthorn and her husband.  She will begin Decadron therapy.  I reviewed the case with radiation oncology.  They will arrange for a brain MRI and brain radiation.  She will complete the current cycle of FOLFOX as scheduled.  Her husband will contact us tomorrow with  an update on her condition.  [name], MD

## 2018-05-11 ENCOUNTER — Telehealth: Payer: Self-pay | Admitting: *Deleted

## 2018-05-11 ENCOUNTER — Other Ambulatory Visit: Payer: Self-pay | Admitting: *Deleted

## 2018-05-11 DIAGNOSIS — C20 Malignant neoplasm of rectum: Secondary | ICD-10-CM

## 2018-05-11 NOTE — Telephone Encounter (Signed)
Husband called to report she had a difficult night with vomiting. Today she is better: pain is less and she is keeping down her meds and bland diet. Wanted MD aware. Dr. Benay Spice will reach out to rad onc to move her consult visit to early next week.

## 2018-05-12 ENCOUNTER — Inpatient Hospital Stay: Payer: 59

## 2018-05-12 ENCOUNTER — Other Ambulatory Visit: Payer: Self-pay | Admitting: Oncology

## 2018-05-12 VITALS — BP 125/105 | HR 85 | Temp 98.5°F | Resp 18

## 2018-05-12 DIAGNOSIS — C20 Malignant neoplasm of rectum: Secondary | ICD-10-CM

## 2018-05-12 DIAGNOSIS — R112 Nausea with vomiting, unspecified: Secondary | ICD-10-CM

## 2018-05-12 DIAGNOSIS — Z5111 Encounter for antineoplastic chemotherapy: Secondary | ICD-10-CM | POA: Diagnosis not present

## 2018-05-12 MED ORDER — HEPARIN SOD (PORK) LOCK FLUSH 100 UNIT/ML IV SOLN
500.0000 [IU] | Freq: Once | INTRAVENOUS | Status: AC | PRN
  Administered 2018-05-12: 500 [IU]
  Filled 2018-05-12: qty 5

## 2018-05-12 MED ORDER — SODIUM CHLORIDE 0.9 % IV SOLN
INTRAVENOUS | Status: AC
  Filled 2018-05-12: qty 250

## 2018-05-12 MED ORDER — SODIUM CHLORIDE 0.9 % IV SOLN
INTRAVENOUS | Status: DC
  Administered 2018-05-12: 11:00:00 via INTRAVENOUS
  Filled 2018-05-12 (×2): qty 250

## 2018-05-12 MED ORDER — ONDANSETRON HCL 4 MG/2ML IJ SOLN
8.0000 mg | Freq: Once | INTRAMUSCULAR | Status: AC
  Administered 2018-05-12: 8 mg via INTRAVENOUS

## 2018-05-12 MED ORDER — SODIUM CHLORIDE 0.9% FLUSH
10.0000 mL | INTRAVENOUS | Status: DC | PRN
  Administered 2018-05-12: 10 mL
  Filled 2018-05-12: qty 10

## 2018-05-12 MED ORDER — ONDANSETRON HCL 4 MG/2ML IJ SOLN
INTRAMUSCULAR | Status: AC
  Filled 2018-05-12: qty 4

## 2018-05-12 NOTE — Patient Instructions (Signed)
Dehydration, Adult Dehydration is when there is not enough fluid or water in your body. This happens when you lose more fluids than you take in. Dehydration can range from mild to very bad. It should be treated right away to keep it from getting very bad. Symptoms of mild dehydration may include:  Thirst.  Dry lips.  Slightly dry mouth.  Dry, warm skin.  Dizziness. Symptoms of moderate dehydration may include:  Very dry mouth.  Muscle cramps.  Dark pee (urine). Pee may be the color of tea.  Your body making less pee.  Your eyes making fewer tears.  Heartbeat that is uneven or faster than normal (palpitations).  Headache.  Light-headedness, especially when you stand up from sitting.  Fainting (syncope). Symptoms of very bad dehydration may include:  Changes in skin, such as: ? Cold and clammy skin. ? Blotchy (mottled) or pale skin. ? Skin that does not quickly return to normal after being lightly pinched and let go (poor skin turgor).  Changes in body fluids, such as: ? Feeling very thirsty. ? Your eyes making fewer tears. ? Not sweating when body temperature is high, such as in hot weather. ? Your body making very little pee.  Changes in vital signs, such as: ? Weak pulse. ? Pulse that is more than 100 beats a minute when you are sitting still. ? Fast breathing. ? Low blood pressure.  Other changes, such as: ? Sunken eyes. ? Cold hands and feet. ? Confusion. ? Lack of energy (lethargy). ? Trouble waking up from sleep. ? Short-term weight loss. ? Unconsciousness. Follow these instructions at home:  If told by your doctor, drink an ORS: ? Make an ORS by using instructions on the package. ? Start by drinking small amounts, about  cup (120 mL) every 5-10 minutes. ? Slowly drink more until you have had the amount that your doctor said to have.  Drink enough clear fluid to keep your pee clear or pale yellow. If you were told to drink an ORS, finish the ORS  first, then start slowly drinking clear fluids. Drink fluids such as: ? Water. Do not drink only water by itself. Doing that can make the salt (sodium) level in your body get too low (hyponatremia). ? Ice chips. ? Fruit juice that you have added water to (diluted). ? Low-calorie sports drinks.  Avoid: ? Alcohol. ? Drinks that have a lot of sugar. These include high-calorie sports drinks, fruit juice that does not have water added, and soda. ? Caffeine. ? Foods that are greasy or have a lot of fat or sugar.  Take over-the-counter and prescription medicines only as told by your doctor.  Do not take salt tablets. Doing that can make the salt level in your body get too high (hypernatremia).  Eat foods that have minerals (electrolytes). Examples include bananas, oranges, potatoes, tomatoes, and spinach.  Keep all follow-up visits as told by your doctor. This is important. Contact a doctor if:  You have belly (abdominal) pain that: ? Gets worse. ? Stays in one area (localizes).  You have a rash.  You have a stiff neck.  You get angry or annoyed more easily than normal (irritability).  You are more sleepy than normal.  You have a harder time waking up than normal.  You feel: ? Weak. ? Dizzy. ? Very thirsty.  You have peed (urinated) only a small amount of very dark pee during 6-8 hours. Get help right away if:  You have symptoms of   very bad dehydration.  You cannot drink fluids without throwing up (vomiting).  Your symptoms get worse with treatment.  You have a fever.  You have a very bad headache.  You are throwing up or having watery poop (diarrhea) and it: ? Gets worse. ? Does not go away.  You have blood or something green (bile) in your throw-up.  You have blood in your poop (stool). This may cause poop to look black and tarry.  You have not peed in 6-8 hours.  You pass out (faint).  Your heart rate when you are sitting still is more than 100 beats a  minute.  You have trouble breathing. This information is not intended to replace advice given to you by your health care provider. Make sure you discuss any questions you have with your health care provider. Document Released: 04/02/2009 Document Revised: 12/25/2015 Document Reviewed: 07/31/2015 Elsevier Interactive Patient Education  2018 [employer]  

## 2018-05-14 ENCOUNTER — Telehealth: Payer: Self-pay | Admitting: *Deleted

## 2018-05-14 NOTE — Telephone Encounter (Signed)
-----   Message from Owens Shark, NP sent at 05/14/2018  1:52 PM EST ----- Please call and check on her

## 2018-05-14 NOTE — Telephone Encounter (Signed)
TCT patient. Spoke with her. She states she is feeling better. She is eating and drinking w/o nausea or vomiting. She did get IVF on Saturday. She is asking about her brain MRI @ -when it is to be scheduled. TCT Mont Dutton re: MRI.  [name] states pt cannot have an MRI due to Implanted pain management stimulator. TCT patient and informed her of the above. Pt to discuss with RadOnc next week.  She is aware of her appt next Tuesday @ 10:30 am

## 2018-05-15 ENCOUNTER — Other Ambulatory Visit: Payer: Self-pay | Admitting: Radiation Therapy

## 2018-05-15 ENCOUNTER — Telehealth: Payer: Self-pay | Admitting: *Deleted

## 2018-05-15 DIAGNOSIS — C7931 Secondary malignant neoplasm of brain: Secondary | ICD-10-CM

## 2018-05-15 DIAGNOSIS — C7949 Secondary malignant neoplasm of other parts of nervous system: Principal | ICD-10-CM

## 2018-05-15 NOTE — Telephone Encounter (Signed)
Patient left VM that her neurologist said she can have MRI brain if she turns off her neurostimulator device. She is asking for MRI brain to be scheduled now with [facility] as soon as possible. This RN called Mont Dutton, radiation oncology brain tumor navigator with this information and she will reach out to patient.

## 2018-05-15 NOTE — Progress Notes (Signed)
M

## 2018-05-15 NOTE — Telephone Encounter (Signed)
Called patient and instructed her to reduce her decadron to 8 mg (two tabs) in am and 4 mg (one tablet) in the evening until further notice. She was able to repeat directions back to RN. Attempted to reach husband, but his voice mail was not set up on mobile #. Patient also reports that her brain MRI will be done here on Monday night.

## 2018-05-21 ENCOUNTER — Ambulatory Visit (HOSPITAL_COMMUNITY)
Admission: RE | Admit: 2018-05-21 | Discharge: 2018-05-21 | Disposition: A | Discharge status: Home / Self Care | Payer: 59 | Source: Ambulatory Visit | Attending: Radiation Oncology | Admitting: Radiation Oncology

## 2018-05-21 DIAGNOSIS — C7949 Secondary malignant neoplasm of other parts of nervous system: Secondary | ICD-10-CM | POA: Insufficient documentation

## 2018-05-21 DIAGNOSIS — C7931 Secondary malignant neoplasm of brain: Secondary | ICD-10-CM | POA: Diagnosis not present

## 2018-05-21 MED ORDER — GADOBUTROL 1 MMOL/ML IV SOLN
7.0000 mL | Freq: Once | INTRAVENOUS | Status: AC | PRN
  Administered 2018-05-21: 7 mL via INTRAVENOUS

## 2018-05-22 ENCOUNTER — Encounter: Payer: Self-pay | Admitting: Radiation Oncology

## 2018-05-22 ENCOUNTER — Other Ambulatory Visit: Payer: Self-pay

## 2018-05-22 ENCOUNTER — Other Ambulatory Visit: Payer: Self-pay | Admitting: Neurosurgery

## 2018-05-22 ENCOUNTER — Ambulatory Visit
Admission: RE | Admit: 2018-05-22 | Discharge: 2018-05-22 | Disposition: A | Discharge status: Home / Self Care | Payer: 59 | Source: Ambulatory Visit | Attending: Radiation Oncology | Admitting: Radiation Oncology

## 2018-05-22 VITALS — BP 124/92 | HR 80 | Temp 98.3°F | Resp 20 | Ht 64.0 in | Wt 157.2 lb

## 2018-05-22 DIAGNOSIS — Z79899 Other long term (current) drug therapy: Secondary | ICD-10-CM | POA: Diagnosis not present

## 2018-05-22 DIAGNOSIS — Z923 Personal history of irradiation: Secondary | ICD-10-CM | POA: Insufficient documentation

## 2018-05-22 DIAGNOSIS — C7949 Secondary malignant neoplasm of other parts of nervous system: Secondary | ICD-10-CM

## 2018-05-22 DIAGNOSIS — I10 Essential (primary) hypertension: Secondary | ICD-10-CM | POA: Insufficient documentation

## 2018-05-22 DIAGNOSIS — C20 Malignant neoplasm of rectum: Secondary | ICD-10-CM | POA: Diagnosis not present

## 2018-05-22 DIAGNOSIS — Z7901 Long term (current) use of anticoagulants: Secondary | ICD-10-CM | POA: Diagnosis not present

## 2018-05-22 DIAGNOSIS — C7931 Secondary malignant neoplasm of brain: Secondary | ICD-10-CM | POA: Diagnosis not present

## 2018-05-22 DIAGNOSIS — C7951 Secondary malignant neoplasm of bone: Secondary | ICD-10-CM | POA: Insufficient documentation

## 2018-05-23 ENCOUNTER — Encounter: Payer: Self-pay | Admitting: General Practice

## 2018-05-23 ENCOUNTER — Encounter: Payer: Self-pay | Admitting: *Deleted

## 2018-05-23 ENCOUNTER — Other Ambulatory Visit: Payer: Self-pay | Admitting: Radiation Oncology

## 2018-05-23 ENCOUNTER — Other Ambulatory Visit: Payer: Self-pay | Admitting: Radiation Therapy

## 2018-05-23 DIAGNOSIS — C20 Malignant neoplasm of rectum: Secondary | ICD-10-CM

## 2018-05-23 DIAGNOSIS — C7949 Secondary malignant neoplasm of other parts of nervous system: Secondary | ICD-10-CM

## 2018-05-23 DIAGNOSIS — C7931 Secondary malignant neoplasm of brain: Secondary | ICD-10-CM | POA: Insufficient documentation

## 2018-05-23 MED ORDER — PANTOPRAZOLE SODIUM 20 MG PO TBEC: 40.0000 mg | DELAYED_RELEASE_TABLET | Freq: Every day | ORAL | 1 refills | Status: DC

## 2018-05-23 NOTE — Progress Notes (Signed)
Husband left disability forms at front desk from [employer]. Placed in inbox for Ivin Poot to complete.

## 2018-05-23 NOTE — Progress Notes (Signed)
Radiation Oncology         [phone] ________________________________  Name: [name]        MRN: [id number]  Date of Service: 05/22/2018 DOB: 18-Jan-1961  DX:IPJAS, Thayer Jew, MD  Ladell Pier, MD     REFERRING PHYSICIAN: Ladell Pier, MD   DIAGNOSIS: The primary encounter diagnosis was Secondary malignant neoplasm of brain and spinal cord [facility]). A diagnosis of Rectal cancer, 12 - 15 cm from anal verge was also pertinent to this visit.   HISTORY OF PRESENT ILLNESS: [name] is a 57 y.o. female seen at the request of Dr. Benay Spice for a history of recurrent metastatic adenocarcinoma of the rectum. She was originally diagnosed with stage III disease who underwent neoadjuvant chemoRT followed by surgical resection and adjuvant chemotherapy. She was NED until January 2019 when scans revealed lymphatic disease in the retroperitoneum and pelvis as well as within the left supraclavicular and paratracheal stations. A biopsy of her left supraclavicular node in February was positive for recurrent rectal cancer. She has been on FOLFIRI since March, and was recently changed to FOLFOX on 04/23/18 due to mild progressive pelvic adenopathy. She presented with a day of gait imbalance and fall as well as memory changes and weakness and a CT head on 05/10/18 revealed severe vasogenic edema and a large 3.3 cm left parietal mass. She had 6 mm left to right shift. She has a complex surgical history from her rectal cancer surgery and required neurostimulator placement due to bladder dysfunction. She was able to turn off her neurostimulator for MRI on 05/21/18. The study was a 1.5T study with SRS protocol and revealed a 3.1 cm mass in the posterior left parietal lobe with extensive vasogenic edema. She's been on Dexamethasone and is currently taking 8 mg in the am, and 4 mg in the PM. Her symptoms dramatically improved with steroids. She comes today to discuss options of radiotherapy to treat her  brain.     PREVIOUS RADIATION THERAPY: Yes   06/18/2013 through 07/26/2013: The patient was treated to the pelvis including the gross tumor volume and at risk nodal volumes to a dose of 45 gray at 1.8 gray per fraction. This was accomplished using a 4 field 3-D conformal technique. The patient then received a boost to the tumor and adjacent high-risk regions for an additional 5.4 gray at 1.8 gray per fraction. This was carried out using a coned-down 4 field approach. [name] total dose was 50.4 gray.   PAST MEDICAL HISTORY:  Past Medical History:  Diagnosis Date  . Anxiety   . Arthritis 08-27-13   at present has ruptured disc- lower back-not an issue now  . Blood infection ([place])    E Coli after UTI   . Colon cancer ([place]) 08-27-13   radiation /chemo -last ended 4 weeks(Dr. Benay Spice)  . DVT (deep venous thrombosis) ([place])    left upper extremity currently has stopped xarelto   . History of radiation therapy 06/18/13-07/26/13   rectal 50.4Gy total dose  . Hypertension    toxemia with pregnancy 1990  . IUD (intrauterine device) in place 08-27-13   Mirena Implant inplace  . Nerve damage    numbness in pelvic area from rectal rection straining to empty bladder   . PONV (postoperative nausea and vomiting)    scopalamine patch helped  . Stress incontinence   . UTI (lower urinary tract infection)    hx of        PAST SURGICAL  HISTORY: Past Surgical History:  Procedure Laterality Date  . APPENDECTOMY    . BACK SURGERY    . bladder study      pressure readings had approx 1.5 months ago   . BREAST SURGERY     reduction surgery  . CERVICAL DISC ARTHROPLASTY  01/03/2012   Procedure: CERVICAL ANTERIOR DISC ARTHROPLASTY;  Surgeon: Charlie Pitter, MD;  Location: [facility];  Service: Neurosurgery;  Laterality: N/A;  Cervical Anterior Disc Arthorplasty Five-Six  . CESAREAN SECTION     x2  . EUS N/A 05/31/2013   Procedure: LOWER ENDOSCOPIC ULTRASOUND (EUS);  Surgeon: Beryle Beams,  MD;  Location: [facility];  Service: Endoscopy;  Laterality: N/A;  . ILEO LOOP COLOSTOMY CLOSURE N/A 05/02/2014   Procedure: LAPAROSCOPIC ASSISTED REVERSAL ILEOSTOMY AND LYSIS OF ADHESIONS;  Surgeon: [name], MD;  Location: [facility];  Service: General;  Laterality: N/A;  . INTRAUTERINE DEVICE INSERTION  2006   [facility]  . IR FLUORO GUIDE PORT INSERTION RIGHT  08/25/2017  . IR US GUIDE VASC ACCESS RIGHT  08/25/2017  . KNEE ARTHROSCOPY Left 08-27-13   torn meniscus repair left   . LAPAROSCOPIC LOW ANTERIOR RESECTION N/A 09/02/2013   Procedure: LAPAROSCOPIC LOW ANTERIOR RESECTION;  Surgeon: [name], MD;  Location: [facility];  Service: General;  Laterality: N/A;  . LAPAROTOMY N/A 09/13/2013   Procedure: EXPLORATORY LAPAROTOMY / FLEXIBLE SIGMOIDOSCOPY/  DRAINAGE OF PELVIC ABSCESS WITH  DIVERTING  LOOP  ILEOSTOMY;  Surgeon: [name], MD;  Location: [facility];  Service: General;  Laterality: N/A;  . LYSIS OF ADHESION  05/02/2014   Procedure: LYSIS OF ADHESION;  Surgeon: [name], MD;  Location: [facility];  Service: General;;  . PORT-A-CATH REMOVAL Left 06/09/2014   Procedure: REMOVAL PORT-A-CATH  (MINOR PROCEDURE) ;  Surgeon: [name], MD;  Location: [facility];  Service: General;  Laterality: Left;  . PORTACATH PLACEMENT Left 10/11/2013   Procedure: INSERTION PORT-A-CATH;  Surgeon: [name], MD;  Location: [facility];  Service: General;  Laterality: Left;  . REFRACTIVE SURGERY       FAMILY HISTORY: No family history on file.   SOCIAL HISTORY:  reports that she has never smoked. She has never used smokeless tobacco. She reports that she drinks alcohol. She reports that she does not use drugs. The patient is married and lives in [place].    ALLERGIES: Patient has no known allergies.   MEDICATIONS:  Current Outpatient Medications  Medication Sig Dispense Refill  . acyclovir (ZOVIRAX) 400 MG tablet Take 1 tablet (400 mg total) by mouth 2 (two) times daily as  needed. 30 tablet 1  . ALPRAZolam (XANAX) 1 MG tablet Take 1 tablet (1 mg total) by mouth at bedtime as needed for anxiety. 30 tablet 0  . dexamethasone (DECADRON) 4 MG tablet Take 2 tablets (8 mg total) by mouth 2 (two) times daily. 40 tablet 0  . lidocaine-prilocaine (EMLA) cream Apply to port site 1 hour prior to use 30 g 2  . LORazepam (ATIVAN) 0.5 MG tablet Place 1 tablet (0.5 mg total) under the tongue every 8 (eight) hours as needed (Nausea). Do not take within 2 hours of Xanax dose. 30 tablet 1  . MINIVELLE 0.075 MG/24HR Place 1 patch onto the skin 2 (two) times a week.     . ondansetron (ZOFRAN) 8 MG tablet Take 1 tablet (8 mg total) by mouth every 8 (eight) hours as needed for nausea or vomiting. Start day  3 after chemo. 30 tablet 0  . oxyCODONE (OXYCONTIN) 10 mg 12 hr tablet Take 1 tablet (10 mg total) by mouth every 12 (twelve) hours. 60 tablet 0  . pantoprazole (PROTONIX) 20 MG tablet Take 1 tablet (20 mg total) by mouth daily. 30 tablet 1  . prochlorperazine (COMPAZINE) 10 MG tablet Take 1 tablet (10 mg total) by mouth every 6 (six) hours as needed for nausea or vomiting. 30 tablet 2  . progesterone (PROMETRIUM) 100 MG capsule Take 100 mg by mouth at bedtime.     . diphenoxylate-atropine (LOMOTIL) 2.5-0.025 MG tablet Take 2 tablets by mouth 4 (four) times daily as needed for diarrhea or loose stools. (Patient not taking: Reported on 04/24/2018) 30 tablet 1  . HYDROcodone-acetaminophen (NORCO) 10-325 MG tablet Take 1 tablet by mouth every 6 (six) hours as needed. (Patient not taking: Reported on 05/22/2018) 60 tablet 0  . HYDROmorphone (DILAUDID) 4 MG tablet Take 1-2 tablets (4-8 mg total) by mouth every 4 (four) hours as needed for severe pain. (Patient not taking: Reported on 05/22/2018) 60 tablet 0  . Multiple Vitamin (MULTIVITAMIN WITH MINERALS) TABS tablet Take 1 tablet by mouth daily.    . rivaroxaban (XARELTO) 20 MG TABS tablet Take 1 tablet (20 mg total) by mouth daily with supper.  (Patient not taking: Reported on 05/22/2018) 30 tablet 2  . sertraline (ZOLOFT) 100 MG tablet Take 150 mg by mouth daily.      No current facility-administered medications for this encounter.      REVIEW OF SYSTEMS: On review of systems, the patient reports that she is doing well overall. She feels better since starting dexamethasone but complains of some indigestion and difficulty sleeping since starting dexamethasone as well as increased urinary frequency. She reports her weakness has resolved, her ability to walk without dizziness or imbalance has also returned to baseline. She denies any chest pain, shortness of breath, cough, fevers, chills,  unintended weight changes. She denies any bowel disturbances and in fact has had looser stools since dexamethasone began, and denies abdominal pain, nausea or vomiting. She denies any new musculoskeletal or joint aches or pains. A complete review of systems is obtained and is otherwise negative.     PHYSICAL EXAM:  Wt Readings from Last 3 Encounters:  05/22/18 157 lb 3.2 oz (71.3 kg)  05/09/18 157 lb 14.4 oz (71.6 kg)  04/23/18 159 lb 14.4 oz (72.5 kg)   Temp Readings from Last 3 Encounters:  05/22/18 98.3 F (36.8 C) (Oral)  05/12/18 98.5 F (36.9 C) (Oral)  05/10/18 99.1 F (37.3 C) (Oral)   BP Readings from Last 3 Encounters:  05/22/18 (!) 124/92  05/12/18 (!) 125/105  05/10/18 134/74   Pulse Readings from Last 3 Encounters:  05/22/18 80  05/12/18 85  05/10/18 (!) 102   Pain Assessment Pain Score: 0-No pain/10  In general this is a well appearing caucasian female in no acute distress. She is alert and oriented x4 and appropriate throughout the examination. HEENT reveals that the patient is normocephalic, atraumatic. No focal neurologic abnormalities are noted. Cardiopulmonary assessment is negative for acute distress and she exhibits normal effort.     ECOG = 1  0 - Asymptomatic (Fully active, able to carry on all predisease  activities without restriction)  1 - Symptomatic but completely ambulatory (Restricted in physically strenuous activity but ambulatory and able to carry out work of a light or sedentary nature. For example, light housework, office work)  2 - Symptomatic, <50%  in bed during the day (Ambulatory and capable of all self care but unable to carry out any work activities. Up and about more than 50% of waking hours)  3 - Symptomatic, >50% in bed, but not bedbound (Capable of only limited self-care, confined to bed or chair 50% or more of waking hours)  4 - Bedbound (Completely disabled. Cannot carry on any self-care. Totally confined to bed or chair)  5 - Death   Eustace Pen MM, Creech RH, Tormey DC, et al. [phone]). "Toxicity and response criteria of the [facility] Group". [place] Oncol. 5 (6): 649-55    LABORATORY DATA:  Lab Results  Component Value Date   WBC 5.3 05/09/2018   HGB 12.1 05/09/2018   HCT 37.0 05/09/2018   MCV 94.6 05/09/2018   PLT 196 05/09/2018   Lab Results  Component Value Date   NA 141 05/09/2018   K 3.8 05/09/2018   CL 108 05/09/2018   CO2 24 05/09/2018   Lab Results  Component Value Date   ALT 11 05/09/2018   AST 14 (L) 05/09/2018   ALKPHOS 62 05/09/2018   BILITOT 0.4 05/09/2018      RADIOGRAPHY: Ct Head W Wo Contrast  Result Date: 05/10/2018 CLINICAL DATA:  Gait imbalance since yesterday, fell. Weakness and memory loss. History of rectal cancer, on anticoagulation. EXAM: CT HEAD WITHOUT AND WITH CONTRAST TECHNIQUE: Contiguous axial images were obtained from the base of the skull through the vertex without and with intravenous contrast CONTRAST:  88mL OMNIPAQUE IOHEXOL 300 MG/ML  SOLN COMPARISON:  None. FINDINGS: BRAIN: Heterogeneously enhancing 2.4 x 2.5 x 3.3 cm LEFT parietal mass at gray-white matter junction with extensive LEFT frontoparietal vasogenic edema resulting in 6 mm LEFT-to-RIGHT midline shift. Mildly enlarged temporal horns  equivocal for early entrapment. No intraparenchymal hemorrhage or acute large vascular territory infarcts. No abnormal extra-axial fluid collections. VASCULAR: Unremarkable. SKULL/SOFT TISSUES: No skull fracture. No significant soft tissue swelling. ORBITS/SINUSES: The included ocular globes and orbital contents are normal.Trace paranasal sinus mucosal thickening. Mastoid air cells are well aerated. OTHER: None. IMPRESSION: 1. 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 Please note, MRI of the brain with contrast better demonstrates smaller metastasis. 2. Mildly enlarged temporal horns, possible early entrapment. 3. Critical Value/emergent results were called by telephone at the time of interpretation on 05/10/2018 at 1:50 pm to Dr. Ned Card , who verbally acknowledged these results. Electronically Signed   By: Elon Alas M.D.   On: 05/10/2018 13:51   Mr Jeri Cos BS Contrast  Result Date: 05/22/2018 CLINICAL DATA:  History of rectal cancer.  Abnormal head CT EXAM: MRI HEAD WITHOUT AND WITH CONTRAST TECHNIQUE: Multiplanar, multiecho pulse sequences of the brain and surrounding structures were obtained without and with intravenous contrast. CONTRAST:  7 cc Gadavist intravenous COMPARISON:  Head CT 05/10/2018 FINDINGS: Brain: 3.1 cm mass in the posterior left parietal lobe with extensive vasogenic edema. In this setting the finding is most consistent with metastatic disease. No second lesion is seen. No infarct, hemorrhage, hydrocephalus, or collection. Vascular: Major flow voids and vascular enhancements are preserved Skull and upper cervical spine: Negative for marrow lesion Sinuses/Orbits: Negative IMPRESSION: 3 cm solitary left parietal metastasis with extensive vasogenic edema. Electronically Signed   By: Monte Fantasia M.D.   On: 05/22/2018 07:33   Ct Abdomen Pelvis W Contrast  Result Date: 04/24/2018 CLINICAL DATA:   Rectal cancer pain and  nausea EXAM: CT ABDOMEN AND PELVIS WITH CONTRAST TECHNIQUE: Multidetector CT imaging of the abdomen and pelvis was performed using the standard protocol following bolus administration of intravenous contrast. CONTRAST:  100 mL Isovue-300 intravenous COMPARISON:  CT 07/05/2017, 05/06/2016, 06/16/2015, 03/16/2018, 01/29/2018 FINDINGS: Lower chest: Lung bases demonstrate dependent atelectasis. No pleural effusion. The heart size is normal Hepatobiliary: Subcentimeter hypodensity in the peripheral right hepatic lobe, no change. No new liver mass. Focal fat infiltration near the falciform ligament. No calcified gallstone or biliary dilatation Pancreas: Unremarkable. No pancreatic ductal dilatation or surrounding inflammatory changes. Spleen: Normal in size without focal abnormality. Adrenals/Urinary Tract: Adrenal glands are normal. Status post left nephrectomy. Right kidney shows no hydronephrosis. The bladder is normal Stomach/Bowel: Stomach nonenlarged. No dilated small bowel. No colon wall thickening. Postsurgical changes at the rectum. Vascular/Lymphatic: Nonaneurysmal aorta. Slight interval increase in retroperitoneal adenopathy since 01/29/2018. A previously measured left periaortic lymph node measures 13 mm as compared with 11 mm previously. Diffuse increased size of previously noted periaortic lymph nodes in the region. Right pelvic sidewall mass also increased in size measuring 2.6 x 2.8 cm, as compared with 2.5 cm previously. Other small pelvic lymph nodes are noted Reproductive: IUD.  Nabothian cyst in the cervix. Other: No free air. 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 Musculoskeletal: Degenerative changes of the lumbar spine. No acute or suspicious osseous lesion. Sacral nerve stimulator. IMPRESSION: 1. No CT evidence for acute intra-abdominal or pelvic  abnormality. 2. Slight interval increase in retroperitoneal adenopathy and right pelvic sidewall mass since prior comparison CTs and concerning for disease progression. 3. Postsurgical changes at the rectum. 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 4. Status post left nephrectomy Electronically Signed   By: Donavan Foil M.D.   On: 04/24/2018 22:48       IMPRESSION/PLAN: 1. Recurrent metastatic adenocarcinoma of the rectum to the nodes and brain. Dr. Lisbeth Renshaw discusses the pathology findings and reviews the nature of metastatic cancers to the brain. We have discussed her case with Dr. Vertell Limber in neurosurgery as she denies a relationship with neurosurgery. She however has previously seen Dr. Annette Stable. We will confirm with the team about her seeing Dr. Vertell Limber. Dr. Lisbeth Renshaw discusses the rationale to consider radiotherapy with preoperative stereotactic radiosurgery in one fraction. She will make dose adjustment with her Dexamethasone to 4 mg BID and will taper following surgery. We discussed the risks, benefits, short, and long term effects of radiotherapy, and the patient is interested in proceeding. Dr. Lisbeth Renshaw discusses the delivery and logistics of radiotherapy and anticipates a course of 1 fraction preoperatively. Dr. Vertell Limber anticipates seeing her next Monday, for treatment next Wednesday and surgery Thursday. Written consent is obtained and placed in the chart, a copy was provided to the patient. Our brain oncology navigator will contact her to coordinate simulation and treatment timing.  2. GI prophylaxis. She was encouraged to increase her protonix to 40 mg daily. I will send in a new prescription for this as she will run out of her current prescription.   In a visit lasting 60 minutes, greater than 50% of the time was spent face to face discussing her case, and coordinating the patient's care.  The above documentation reflects my direct  findings during this shared patient visit. Please see the separate note by Dr. Lisbeth Renshaw on this date for  the remainder of the patient's plan of care.    Carola Rhine, PAC

## 2018-05-23 NOTE — Progress Notes (Signed)
[place] Psychosocial Distress Screening Clinical Social Work  Clinical Social Work was referred by distress screening protocol.  The patient scored a 5 on the Psychosocial Distress Thermometer which indicates moderate distress. Clinical Social Worker contacted patient by phone to assess for distress and other psychosocial needs. Patient is dealing w multiple emotional issues related to mortality, "especially the people I will leave behind."  Aware of severity of disease.  Has two young adult sons and husband.  Will schedule time to connect [place] after upcoming surgery for brain met.  CSW will mail information packet so she has contact information and additional resources.    ONCBCN DISTRESS SCREENING 05/22/2018  Screening Type Initial Screening  Distress experienced in past week (1-10) 5  Family Problem type Other (comment)  Emotional problem type Depression;Nervousness/Anxiety;Adjusting to illness;Adjusting to appearance changes  Spiritual/Religous concerns type Facing my mortality;Loss of sense of purpose  Information Concerns Type Lack of info about diagnosis;Lack of info about treatment;Lack of info about complementary therapy choices;Lack of info about maintaining fitness  Physical Problem type Pain;Nausea/vomiting;Sleep/insomnia;Getting around;Bathing/dressing;Mouth sores/swallowing;Constipation/diarrhea;Changes in urination;Tingling hands/feet    Clinical Social Worker follow up needed: Yes.    Await patient call to schedule as needed.    If yes, follow up plan:  Beverely Pace, [place], LCSW Clinical Social Worker Phone:  [phone]

## 2018-05-24 ENCOUNTER — Inpatient Hospital Stay: Payer: 59

## 2018-05-24 ENCOUNTER — Telehealth: Payer: Self-pay | Admitting: *Deleted

## 2018-05-24 ENCOUNTER — Inpatient Hospital Stay: Payer: 59 | Attending: Nurse Practitioner | Admitting: Nurse Practitioner

## 2018-05-24 ENCOUNTER — Encounter: Payer: Self-pay | Admitting: Nurse Practitioner

## 2018-05-24 VITALS — BP 123/90 | HR 82 | Temp 98.6°F | Resp 18 | Ht 64.0 in | Wt 155.4 lb

## 2018-05-24 DIAGNOSIS — Z5111 Encounter for antineoplastic chemotherapy: Secondary | ICD-10-CM | POA: Diagnosis not present

## 2018-05-24 DIAGNOSIS — R319 Hematuria, unspecified: Secondary | ICD-10-CM

## 2018-05-24 DIAGNOSIS — N390 Urinary tract infection, site not specified: Secondary | ICD-10-CM | POA: Diagnosis not present

## 2018-05-24 DIAGNOSIS — C7931 Secondary malignant neoplasm of brain: Secondary | ICD-10-CM | POA: Insufficient documentation

## 2018-05-24 DIAGNOSIS — Z95828 Presence of other vascular implants and grafts: Secondary | ICD-10-CM

## 2018-05-24 DIAGNOSIS — C778 Secondary and unspecified malignant neoplasm of lymph nodes of multiple regions: Secondary | ICD-10-CM | POA: Insufficient documentation

## 2018-05-24 DIAGNOSIS — Z923 Personal history of irradiation: Secondary | ICD-10-CM

## 2018-05-24 DIAGNOSIS — C20 Malignant neoplasm of rectum: Secondary | ICD-10-CM

## 2018-05-24 LAB — CMP (CANCER CENTER ONLY)
ALT: 22 U/L (ref 0–44)
ANION GAP: 10 (ref 5–15)
AST: 12 U/L — ABNORMAL LOW (ref 15–41)
Albumin: 3.6 g/dL (ref 3.5–5.0)
Alkaline Phosphatase: 71 U/L (ref 38–126)
BUN: 32 mg/dL — ABNORMAL HIGH (ref 6–20)
CO2: 22 mmol/L (ref 22–32)
Calcium: 9.8 mg/dL (ref 8.9–10.3)
Chloride: 105 mmol/L (ref 98–111)
Creatinine: 1.16 mg/dL — ABNORMAL HIGH (ref 0.44–1.00)
GFR, Estimated: 53 mL/min — ABNORMAL LOW (ref >60)
Glucose, Bld: 119 mg/dL — ABNORMAL HIGH (ref 70–99)
Potassium: 4.5 mmol/L (ref 3.5–5.1)
Sodium: 137 mmol/L (ref 135–145)
Total Bilirubin: 0.4 mg/dL (ref 0.3–1.2)
Total Protein: 7.1 g/dL (ref 6.5–8.1)

## 2018-05-24 LAB — CBC WITH DIFFERENTIAL (CANCER CENTER ONLY)
Abs Immature Granulocytes: 0.04 10*3/uL (ref 0.00–0.07)
BASOS ABS: 0 10*3/uL (ref 0.0–0.1)
Basophils Relative: 0 %
Eosinophils Absolute: 0 10*3/uL (ref 0.0–0.5)
Eosinophils Relative: 0 %
HCT: 37.3 % (ref 36.0–46.0)
Hemoglobin: 12.4 g/dL (ref 12.0–15.0)
Immature Granulocytes: 1 %
Lymphocytes Relative: 5 %
Lymphs Abs: 0.3 10*3/uL — ABNORMAL LOW (ref 0.7–4.0)
MCH: 30.8 pg (ref 26.0–34.0)
MCHC: 33.2 g/dL (ref 30.0–36.0)
MCV: 92.6 fL (ref 80.0–100.0)
Monocytes Absolute: 0.4 10*3/uL (ref 0.1–1.0)
Monocytes Relative: 7 %
Neutro Abs: 4.7 10*3/uL (ref 1.7–7.7)
Neutrophils Relative %: 87 %
Platelet Count: 168 10*3/uL (ref 150–400)
RBC: 4.03 MIL/uL (ref 3.87–5.11)
RDW: 12.9 % (ref 11.5–15.5)
WBC Count: 5.4 10*3/uL (ref 4.0–10.5)
nRBC: 0 % (ref 0.0–0.2)

## 2018-05-24 LAB — CEA (IN HOUSE-CHCC): CEA (CHCC-IN HOUSE): 10.13 ng/mL — AB (ref 0.00–5.00)

## 2018-05-24 MED ORDER — SODIUM CHLORIDE 0.9% FLUSH
10.0000 mL | INTRAVENOUS | Status: DC | PRN
  Administered 2018-05-24: 10 mL
  Filled 2018-05-24: qty 10

## 2018-05-24 MED ORDER — HEPARIN SOD (PORK) LOCK FLUSH 100 UNIT/ML IV SOLN
500.0000 [IU] | Freq: Once | INTRAVENOUS | Status: AC | PRN
  Administered 2018-05-24: 500 [IU]
  Filled 2018-05-24: qty 5

## 2018-05-24 NOTE — Telephone Encounter (Signed)
An attempt made to contact patient per Lattie Haw, NP regarding appointment 05/26/18- infusion 2 hours, and pump start and stop is not needed. Left voice message for a return call.  A scheduling message sent to remove appointments and contact patient.

## 2018-05-24 NOTE — Progress Notes (Addendum)
[facility] OFFICE PROGRESS NOTE   Diagnosis: Rectal cancer  INTERVAL HISTORY:   [name] returns as scheduled.  She completed cycle 2 FOLFOX 05/10/2018.  She is feeling much better since beginning dexamethasone.  Specifically coordination is better.  She has had no further falls.  No headaches.  No nausea.  Pain has resolved.  Her husband states she is "wired".  Decadron was tapered 4 mg twice daily recently.  Objective:  Vital signs in last 24 hours:  Blood pressure 123/90, pulse 82, temperature 98.6 F (37 C), temperature source Oral, resp. rate 18, height 5' 4"  (1.626 m), weight 155 lb 6.4 oz (70.5 kg), SpO2 100 %.    HEENT: No thrush or ulcers. Lymphatics: Persistent left low medial neck mass. Resp: Lungs clear bilaterally. Cardio: Regular rate and rhythm. GI: Abdomen soft and nontender.  No hepatomegaly. Vascular: No leg edema. Neuro: Alert and oriented.  Port-A-Cath without erythema.  Lab Results:  Lab Results  Component Value Date   WBC 5.4 05/24/2018   HGB 12.4 05/24/2018   HCT 37.3 05/24/2018   MCV 92.6 05/24/2018   PLT 168 05/24/2018   NEUTROABS 4.7 05/24/2018    Imaging:  No results found.  Medications: I have reviewed the patient's current medications.  Assessment/Plan: 1. Rectal cancer, clinical stage III (T3 N1) status post biopsy of a rectal mass on 05/24/2013 confirming adenocarcinoma. Endoscopic ultrasound 05/31/2013 measured the mass at 11 cm from the anal verge, uT3uN1.  Initiation of radiation and concurrent Xeloda 06/18/2013; completion 07/26/2013.   Status post low anterior resection 09/02/2013. Invasive adenocarcinoma, 3.7 cm, negative margins; metastatic carcinoma in 7 of 11 lymph nodes; extensive lymph vascular involvement by tumor.MSI-stable, no loss of mismatch repair protein expression  Cycle 1 adjuvant FOLFOX 10/15/2013.   Cycle 2 adjuvant FOLFOX 10/29/2013.   Cycle 3 adjuvant FOLFOX 11/14/2013.   Cycle  4 adjuvant FOLFOX 11/27/2013.   Cycle 5 adjuvant FOLFOX 12/10/2013   Cycle 6 adjuvant FOLFOX 12/24/2013.   Cycle 7 adjuvant FOLFOX 01/14/2014.   Cycle 8 adjuvant FOLFOX 01/28/2014   Cycle 9 adjuvant FOLFOX 02/11/2014 (oxaliplatin dose reduced secondary to thrombocytopenia)   Surveillance CT scans 06/02/2014 with no evidence of recurrent rectal cancer  surveillance CT scans 06/16/2015 with no evidence of recurrent rectal cancer, 10 mm right external iliac node , increased amount of loculated air associated with the presacral soft tissue thickening , new left hydronephrosis  Surveillance CT scans 05/06/2016-negative for recurrent rectal cancer, decreased right external iliac node, persistent left hydronephrosis  CT abdomen/pelvis 07/05/2017-new left para-aortic, aortocaval, right common iliac and right external iliac lymphadenopathy.  CT neck/chest 07/13/2017-enlarged left supraclavicular nodes, new paratracheal nodes  Ultrasound-guided biopsy of a left supraclavicular lymph node 07/25/2017-metastatic adenocarcinoma consistent with colorectal cancer,MSI-stable, tumor mutation burden-10, K-ras S65mtation  Cycle 1 FOLFIRI 08/28/2017  Cycle 2 FOLFIRI 09/11/2017  Cycle 3 FOLFIRI 09/25/2017  Cycle 4 FOLFIRI 10/09/2017  Cycle 5 FOLFIRI 10/23/2017  CTs 11/14/2017-slight decrease in left supraclavicular adenopathy, slight increase and decrease in mediastinal/retroperitoneal/pelvic nodes, no new disease  Cycle 6 FOLFIRI 11/14/2017  Cycle 7 FOLFIRI 11/27/2017  Cycle 8 FOLFIRI 12/11/2017  Cycle 9 FOLFIRI 01/03/2018  Cycle 10 FOLFIRI 01/18/2018  Restaging CTs 01/29/2018-stable thoracic adenopathywith mildly improved abdominal adenopathy. Similar appearance of presacral soft tissue density. Suspected small fistula from the posterior margin of the rectum or anastomosis to the presacral region.  Cycle 11 FOLFIRI 01/31/2018  Cycle 12 FOLFIRI 02/20/2018  CT pelvis 03/16/2018-mild  progression of pelvic lymphadenopathy with compression of  the right iliac vein by an enlarged right obturator node  Cycle 1 FOLFOX 04/23/2018  CT emergency department 04/24/2018- slight interval increase in retroperitoneal adenopathy and right pelvic sidewall mass.  Cycle 2 FOLFOX 05/10/2018  Brain CT 05/10/2018- left parietal metastasis with extensive vasogenic edema  Brain MRI 05/22/2018- 3 cm solitary left parietal metastasis with extensive vasogenic edema 2. Post colonoscopy bowel perforation confirmed on abdomen CT 05/28/2013. 3. Single indeterminate liver lesion on the abdomen CT 05/28/2013. MRI on 06/18/2013 confirmed 2 tiny liver lesions, indeterminate-favored to be benign cysts. 4. History of cervical and lumbar disc disease.  5. History of HELLP syndrome with first pregnancy.  6. Delayed nausea following cycle 1 FOLFOX. She receives Aloxi and Emend. She continues to experience delayed nausea. Prophylactic Decadron added with cycle 4. 7. History of pain with bowel movements. 8. Anastomotic leak on CT 09/13/2013. Status post exploratory laparotomy with drainage of a pelvic abscess, diverting loop ileostomy. Drainage catheter removed 10/23/2013.  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 Perineal numbness, urinary retention. The urinary retention has improved. 10. Perineal pain-improved. 11. Neutropenia secondary chemotherapy-she received Neulasta. 12. Urinary tract infection, Escherichia coli-11/03/2013. 13. Venous engorgement at the left anterior chest and left arm with swelling of the left arm and hand. Venous Doppler 11/14/2013 positive for DVT. She began Lovenox 11/14/2013 and was switched to xarelto 12/10/2013. Xarelto discontinued early January 2016. 14. Fever 11/20/2013. Blood and urine cultures negative. The fever resolved within 24 hours. She continued to have a  single episode of fever following chemotherapy 15. History of a herpetic lip lesion 16. 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 17. Escherichia coli bacteremia, urinary source, 01/14/2014. She completed a 10 day course of ceftriaxone. 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 Anxiety/depression. 20. Port-A-Cath removal 06/09/2014. 21. Escherichia coli urinary tract infection 06/24/2014. She completed a course of ciprofloxacin. 22. frequent bowel movements following surgery/radiation -status post placement of a sacral stimulator 11/21/2014 23. CT 06/16/2015 with new left hydronephrosis status post cystoscopy with placement of a double-J stent 07/17/2015. No tumors or stones were seen in the bladder. Both ureteral orifices were unremarkable. On the left side retrograde study was performed. The distal ureter was narrowed and then a little bit more dilated proximally with hydronephrosis noted. There was no evidence of intramural tumor but the area did seem a bit strictured.  Stent removed, persistent hydronephrosis, followed by Dr. Amalia Hailey  CT 05/06/2016-increased left hydronephrosis  Ultrasound 09/09/2016-stable left hydroureteronephrosis  Left nephrectomy 04/28/2017  24.Compression of right iliac vein on CT 03/16/2018-placed on Xarelto anticoagulation  Disposition: Ms. Barner appears stable.  She has completed 2 cycles of FOLFOX.  She was found to have an isolated brain metastasis 2 weeks ago.  The symptoms she was experiencing at that time have improved considerably.  This coincides with initiation of dexamethasone.  She is scheduled for [facility] and surgery next week.  We decided to hold cycle 3 FOLFOX today.  She will return for lab, follow-up and chemotherapy in 2 weeks.  She will contact the office in the interim with any problems.  She is tapering dexamethasone as per radiation oncology instructions.  Patient seen with Dr.  Benay Spice.    Ned Card ANP/GNP-BC   05/24/2018  11:17 AM  This was a shared visit with Ned Card.  Ms. Stockburger has experienced significant neurologic improvement since starting Decadron.  She is scheduled for [facility] next week.  FOLFOX will remain on hold until after the [facility] procedure.  [name], MD

## 2018-05-24 NOTE — Progress Notes (Signed)
Has armband been applied?  Yes  Does patient have an allergy to IV contrast dye?: No   Has patient ever received premedication for IV contrast dye?: N/A  Does patient take metformin?: No  If patient does take metformin when was the last dose: N/A  Date of lab work: 05/25/2018 BUN: 32 CR: 1.16 EGfr: 53  IV site: Right Chest port  Has IV site been added to flowsheet?  Yes  BP 124/78 (BP Location: Left Arm)   Pulse 89   Temp 98.5 F (36.9 C) (Oral)   Resp 20   SpO2 99%    Tawan Degroote M. Leonie Green, BSN

## 2018-05-25 ENCOUNTER — Telehealth: Payer: Self-pay | Admitting: Nurse Practitioner

## 2018-05-25 ENCOUNTER — Ambulatory Visit
Admission: RE | Admit: 2018-05-25 | Discharge: 2018-05-25 | Disposition: A | Discharge status: Home / Self Care | Payer: 59 | Source: Ambulatory Visit | Attending: Radiation Oncology | Admitting: Radiation Oncology

## 2018-05-25 VITALS — BP 124/78 | HR 89 | Temp 98.5°F | Resp 20 | Ht 64.0 in | Wt 158.8 lb

## 2018-05-25 DIAGNOSIS — C7931 Secondary malignant neoplasm of brain: Secondary | ICD-10-CM | POA: Insufficient documentation

## 2018-05-25 DIAGNOSIS — C7949 Secondary malignant neoplasm of other parts of nervous system: Secondary | ICD-10-CM | POA: Insufficient documentation

## 2018-05-25 NOTE — Progress Notes (Signed)
FMLA successfully faxed to [employer] at [phone]. Mailed copy to patient address on file.

## 2018-05-25 NOTE — Telephone Encounter (Signed)
Cancelled appt per 12/5 sch message - pt is aware of appt being cancelled.

## 2018-05-26 ENCOUNTER — Ambulatory Visit: Payer: 59

## 2018-05-28 ENCOUNTER — Other Ambulatory Visit: Payer: Self-pay | Admitting: Nurse Practitioner

## 2018-05-28 DIAGNOSIS — C20 Malignant neoplasm of rectum: Secondary | ICD-10-CM

## 2018-05-28 NOTE — Pre-Procedure Instructions (Signed)
[name]  05/28/2018        Your procedure is scheduled on Thursday December 12th.  Report to [facility] Admitting at 11:00 A.M.  Call this number if you have problems the morning of surgery:  [phone]   Remember:  Do not eat or drink after midnight.       Take these medicines the morning of surgery with A SIP OF WATER  dexamethasone (DECADRON) diphenoxylate-atropine (LOMOTIL) -if needed HYDROcodone-acetaminophen (NORCO)-if needed HYDROmorphone (DILAUDID)-if needed ondansetron (ZOFRAN)-if needed pantoprazole (PROTONIX)-if needed prochlorperazine (COMPAZINE)-if needed  7 days prior to surgery STOP taking any Aspirin(unless otherwise instructed by your surgeon), Aleve, Naproxen, Ibuprofen, Motrin, Advil, Goody's, BC's, all herbal medications, fish oil, and all vitamins     Do not wear jewelry, make-up or nail polish.  Do not wear lotions, powders, or perfumes, or deodorant.  Do not shave 48 hours prior to surgery.   Do not bring valuables to the hospital.  [facility] is not responsible for any belongings or valuables.  Contacts, eyeglasses, hearing aids, dentures or bridgework may not be worn into surgery.  Leave your suitcase in the car.  After surgery it may be brought to your room.  For patients admitted to the hospital, discharge time will be determined by your treatment team.  Patients discharged the day of surgery will not be allowed to drive home.   [facility]- Preparing For Surgery  Before surgery, you can play an important role. Because skin is not sterile, your skin needs to be as free of germs as possible. You can reduce the number of germs on your skin by washing with CHG (chlorahexidine gluconate) Soap before surgery.  CHG is an antiseptic cleaner which kills germs and bonds with the skin to continue killing germs even after washing.    Oral Hygiene is also important to reduce your risk of infection.  Remember - BRUSH YOUR TEETH THE  MORNING OF SURGERY WITH YOUR REGULAR TOOTHPASTE  Please do not use if you have an allergy to CHG or antibacterial soaps. If your skin becomes reddened/irritated stop using the CHG.  Do not shave (including legs and underarms) for at least 48 hours prior to first CHG shower. It is OK to shave your face.  Please follow these instructions carefully.   1. Shower the NIGHT BEFORE SURGERY and the MORNING OF SURGERY with CHG.   2. If you chose to wash your hair, wash your hair first as usual with your normal shampoo.  3. After you shampoo, rinse your hair and body thoroughly to remove the shampoo.  4. Use CHG as you would any other liquid soap. You can apply CHG directly to the skin and wash gently with a scrungie or a clean washcloth.   5. Apply the CHG Soap to your body ONLY FROM THE NECK DOWN.  Do not use on open wounds or open sores. Avoid contact with your eyes, ears, mouth and genitals (private parts). Wash Face and genitals (private parts)  with your normal soap.  6. Wash thoroughly, paying special attention to the area where your surgery will be performed.  7. Thoroughly rinse your body with warm water from the neck down.  8. DO NOT shower/wash with your normal soap after using and rinsing off the CHG Soap.  9. Pat yourself dry with a CLEAN TOWEL.  10. Wear CLEAN PAJAMAS to bed the night before surgery, wear comfortable clothes the morning of surgery  11. Place CLEAN SHEETS on your  bed the night of your first shower and DO NOT SLEEP WITH PETS.    Day of Surgery: Shower as stated above. Do not apply any deodorants/lotions.  Please wear clean clothes to the hospital/surgery center.   Remember to brush your teeth WITH YOUR REGULAR TOOTHPASTE.

## 2018-05-29 ENCOUNTER — Encounter (HOSPITAL_COMMUNITY)
Admission: RE | Admit: 2018-05-29 | Discharge: 2018-05-29 | Disposition: A | Discharge status: Home / Self Care | Payer: 59 | Source: Ambulatory Visit | Attending: Neurosurgery | Admitting: Neurosurgery

## 2018-05-29 ENCOUNTER — Other Ambulatory Visit: Payer: Self-pay

## 2018-05-29 ENCOUNTER — Encounter (HOSPITAL_COMMUNITY): Payer: Self-pay

## 2018-05-29 ENCOUNTER — Other Ambulatory Visit: Payer: Self-pay | Admitting: *Deleted

## 2018-05-29 DIAGNOSIS — Z01818 Encounter for other preprocedural examination: Secondary | ICD-10-CM | POA: Insufficient documentation

## 2018-05-29 DIAGNOSIS — C20 Malignant neoplasm of rectum: Secondary | ICD-10-CM

## 2018-05-29 DIAGNOSIS — I10 Essential (primary) hypertension: Secondary | ICD-10-CM

## 2018-05-29 DIAGNOSIS — D496 Neoplasm of unspecified behavior of brain: Secondary | ICD-10-CM

## 2018-05-29 LAB — TYPE AND SCREEN
ABO/RH(D): O POS
Antibody Screen: NEGATIVE

## 2018-05-29 LAB — PROTIME-INR
INR: 0.9
Prothrombin Time: 12 seconds (ref 11.4–15.2)

## 2018-05-29 MED ORDER — ALPRAZOLAM 1 MG PO TABS: 1.0000 mg | ORAL_TABLET | Freq: Every evening | ORAL | 0 refills | Status: DC | PRN

## 2018-05-29 NOTE — Pre-Procedure Instructions (Addendum)
SAYLA GOLONKA  05/29/2018        Your procedure is scheduled on Thursday December 12th.  Report to [facility] Admitting at 11:00 A.M.  Call this number if you have problems the morning of surgery:  [phone]   Remember:  Do not eat or drink after midnight.       Take these medicines the morning of surgery with A SIP OF WATER  dexamethasone (Decadron) diphenoxylate-atropine (LOMOTIL) -if needed HYDROcodone-acetaminophen (NORCO)-if needed HYDROmorphone (DILAUDID)-if needed ondansetron (ZOFRAN)-if needed pantoprazole (PROTONIX)-if needed prochlorperazine (COMPAZINE)-if needed  Follow your surgeon's instructions on when to stop Xarelto.  If no instructions were given by your surgeon then you will need to call the office to get those instructions.    7 days prior to surgery STOP taking any Aspirin(unless otherwise instructed by your surgeon), Aleve, Naproxen, Ibuprofen, Motrin, Advil, Goody's, BC's, all herbal medications, fish oil, and all vitamins     Do not wear jewelry, make-up or nail polish.  Do not wear lotions, powders, or perfumes, or deodorant.  Do not shave 48 hours prior to surgery.   Do not bring valuables to the hospital.  [facility] is not responsible for any belongings or valuables.  Contacts, eyeglasses, hearing aids, dentures or bridgework may not be worn into surgery.  Leave your suitcase in the car.  After surgery it may be brought to your room.  For patients admitted to the hospital, discharge time will be determined by your treatment team.  Patients discharged the day of surgery will not be allowed to drive home.   [facility]- Preparing For Surgery  Before surgery, you can play an important role. Because skin is not sterile, your skin needs to be as free of germs as possible. You can reduce the number of germs on your skin by washing with CHG (chlorahexidine gluconate) Soap before surgery.  CHG is an antiseptic cleaner which kills  germs and bonds with the skin to continue killing germs even after washing.    Oral Hygiene is also important to reduce your risk of infection.  Remember - BRUSH YOUR TEETH THE MORNING OF SURGERY WITH YOUR REGULAR TOOTHPASTE  Please do not use if you have an allergy to CHG or antibacterial soaps. If your skin becomes reddened/irritated stop using the CHG.  Do not shave (including legs and underarms) for at least 48 hours prior to first CHG shower. It is OK to shave your face.  Please follow these instructions carefully.   1. Shower the NIGHT BEFORE SURGERY and the MORNING OF SURGERY with CHG.   2. If you chose to wash your hair, wash your hair first as usual with your normal shampoo.  3. After you shampoo, rinse your hair and body thoroughly to remove the shampoo.  4. Use CHG as you would any other liquid soap. You can apply CHG directly to the skin and wash gently with a scrungie or a clean washcloth.   5. Apply the CHG Soap to your body ONLY FROM THE NECK DOWN.  Do not use on open wounds or open sores. Avoid contact with your eyes, ears, mouth and genitals (private parts). Wash Face and genitals (private parts)  with your normal soap.  6. Wash thoroughly, paying special attention to the area where your surgery will be performed.  7. Thoroughly rinse your body with warm water from the neck down.  8. DO NOT shower/wash with your normal soap after using and rinsing off the CHG Soap.  9. Pat yourself dry with a CLEAN TOWEL.  10. Wear CLEAN PAJAMAS to bed the night before surgery, wear comfortable clothes the morning of surgery  11. Place CLEAN SHEETS on your bed the night of your first shower and DO NOT SLEEP WITH PETS.    Day of Surgery: Shower as stated above. Do not apply any deodorants/lotions.  Please wear clean clothes to the hospital/surgery center.   Remember to brush your teeth WITH YOUR REGULAR TOOTHPASTE.

## 2018-05-29 NOTE — Progress Notes (Signed)
PCP - Dr. Deland Pretty Cardiologist - denies  Chest x-ray - N/A EKG - 05/29/18 Stress Test - denies ECHO - denies Cardiac Cath - denies  Sleep Study - denies   Blood Thinner Instructions: patient states she has been off Xarelto x 3 weeks.  Aspirin Instructions: patient instructed to hold all ASA/NSAID's/herbal medications/vitamins and fish oils as of today.  Anesthesia review: no  Patient denies shortness of breath, fever, cough and chest pain at PAT appointment   Patient verbalized understanding of instructions that were given to them at the PAT appointment. Patient was also instructed that they will need to review over the PAT instructions again at home before surgery.

## 2018-05-29 NOTE — Telephone Encounter (Signed)
Refill for alprazolam approved and called in.

## 2018-05-30 ENCOUNTER — Encounter: Payer: Self-pay | Admitting: Radiation Oncology

## 2018-05-30 ENCOUNTER — Ambulatory Visit
Admission: RE | Admit: 2018-05-30 | Discharge: 2018-05-30 | Disposition: A | Discharge status: Home / Self Care | Payer: 59 | Source: Ambulatory Visit | Attending: Radiation Oncology | Admitting: Radiation Oncology

## 2018-05-30 ENCOUNTER — Other Ambulatory Visit: Payer: Self-pay | Admitting: Oncology

## 2018-05-30 DIAGNOSIS — C7931 Secondary malignant neoplasm of brain: Secondary | ICD-10-CM

## 2018-05-30 DIAGNOSIS — C20 Malignant neoplasm of rectum: Secondary | ICD-10-CM

## 2018-05-30 NOTE — Op Note (Signed)
  Name: [name]  MRN: [id number]  Date: 05/30/2018   DOB: 10/05/1960  Stereotactic Radiosurgery Operative Note  PRE-OPERATIVE DIAGNOSIS:  Multiple Brain Metastases  POST-OPERATIVE DIAGNOSIS:  Multiple Brain Metastases  PROCEDURE:  Stereotactic Radiosurgery  SURGEON:  [name], MD  NARRATIVE: The patient underwent a radiation treatment planning session in the radiation oncology simulation suite under the care of the radiation oncology physician and physicist.  I participated closely in the radiation treatment planning afterwards. The patient underwent planning CT which was fused to 3T high resolution MRI with 1 mm axial slices.  These images were fused on the planning system.  We contoured the gross target volumes and subsequently expanded this to yield the Planning Target Volume. I actively participated in the planning process.  I helped to define and review the target contours and also the contours of the optic pathway, eyes, brainstem and selected nearby organs at risk.  All the dose constraints for critical structures were reviewed and compared to AAPM Task Group 101.  The prescription dose conformity was reviewed.  I approved the plan electronically.    Accordingly, [name] was brought to the TrueBeam stereotactic radiation treatment linac and placed in the custom immobilization mask.  The patient was aligned according to the IR fiducial markers with BrainLab Exactrac, then orthogonal x-rays were used in ExacTrac with the 6DOF robotic table and the shifts were made to align the patient  [name] received stereotactic radiosurgery uneventfully.    Lesions treated:  1   Complex lesions treated:  0 (>3.5 cm, <22mm of optic path, or within the brainstem)   The detailed description of the procedure is recorded in the radiation oncology procedure note.  I was present for the duration of the procedure.  DISPOSITION:  Following delivery, the patient was transported  to nursing in stable condition and monitored for possible acute effects to be discharged to home in stable condition with follow-up in one month.  [name], MD 05/30/2018 4:34 PM

## 2018-05-30 NOTE — Progress Notes (Signed)
  Radiation Oncology         [phone] ________________________________  Name: RUCHAMA KUBICEK MRN: [id number]  Date: 05/25/2018  DOB: June 25, 1960  DIAGNOSIS:     ICD-10-CM   1. Secondary malignant neoplasm of brain and spinal cord [facility]) C79.31    C79.49     NARRATIVE:  The patient was brought to the [facility].  Identity was confirmed.  All relevant records and images related to the planned course of therapy were reviewed.  The patient freely provided informed written consent to proceed with treatment after reviewing the details related to the planned course of therapy. The consent form was witnessed and verified by the simulation staff. Intravenous access was established for contrast administration. Then, the patient was set-up in a stable reproducible supine position for radiation therapy.  A relocatable thermoplastic stereotactic head frame was fabricated for precise immobilization.  CT images were obtained.  Surface markings were placed.  The CT images were loaded into the planning software and fused with the patient's targeting MRI scan.  Then the target and avoidance structures were contoured.  Treatment planning then occurred.  The radiation prescription was entered and confirmed.  I have requested 3D planning  I have requested a DVH of the following structures: Brain stem, brain, left eye, right eye, lenses, optic chiasm, target volumes, uninvolved brain, and normal tissue.    SPECIAL TREATMENT PROCEDURE:  The planned course of therapy using radiation constitutes a special treatment procedure. Special care is required in the management of this patient for the following reasons. This treatment constitutes a Special Treatment Procedure for the following reason: High dose per fraction requiring special monitoring for increased toxicities of treatment including daily imaging.  The special nature of the planned course of radiotherapy will require increased physician supervision  and oversight to ensure patient's safety with optimal treatment outcomes.  PLAN:  The patient will receive 14 Gy in 1 fraction.   ------------------------------------------------  Jodelle Gross, MD, PhD

## 2018-05-31 ENCOUNTER — Encounter (HOSPITAL_COMMUNITY)
Admission: RE | Disposition: A | Discharge status: Home / Self Care | Payer: Self-pay | Source: Home / Self Care | Attending: Neurosurgery

## 2018-05-31 ENCOUNTER — Inpatient Hospital Stay (HOSPITAL_COMMUNITY)
Admission: RE | Admit: 2018-05-31 | Discharge: 2018-06-01 | DRG: 025 | Disposition: A | Discharge status: Home / Self Care | Payer: 59 | Attending: Neurosurgery | Admitting: Neurosurgery

## 2018-05-31 ENCOUNTER — Other Ambulatory Visit: Payer: Self-pay

## 2018-05-31 ENCOUNTER — Inpatient Hospital Stay (HOSPITAL_COMMUNITY): Payer: 59 | Admitting: Anesthesiology

## 2018-05-31 ENCOUNTER — Encounter (HOSPITAL_COMMUNITY): Payer: Self-pay | Admitting: Anesthesiology

## 2018-05-31 DIAGNOSIS — Z905 Acquired absence of kidney: Secondary | ICD-10-CM

## 2018-05-31 DIAGNOSIS — Z8249 Family history of ischemic heart disease and other diseases of the circulatory system: Secondary | ICD-10-CM | POA: Diagnosis not present

## 2018-05-31 DIAGNOSIS — M1990 Unspecified osteoarthritis, unspecified site: Secondary | ICD-10-CM | POA: Diagnosis present

## 2018-05-31 DIAGNOSIS — Z79891 Long term (current) use of opiate analgesic: Secondary | ICD-10-CM | POA: Diagnosis not present

## 2018-05-31 DIAGNOSIS — F419 Anxiety disorder, unspecified: Secondary | ICD-10-CM | POA: Diagnosis present

## 2018-05-31 DIAGNOSIS — G8191 Hemiplegia, unspecified affecting right dominant side: Secondary | ICD-10-CM | POA: Diagnosis present

## 2018-05-31 DIAGNOSIS — Z86718 Personal history of other venous thrombosis and embolism: Secondary | ICD-10-CM | POA: Diagnosis not present

## 2018-05-31 DIAGNOSIS — Z79899 Other long term (current) drug therapy: Secondary | ICD-10-CM

## 2018-05-31 DIAGNOSIS — Z85048 Personal history of other malignant neoplasm of rectum, rectosigmoid junction, and anus: Secondary | ICD-10-CM

## 2018-05-31 DIAGNOSIS — C20 Malignant neoplasm of rectum: Secondary | ICD-10-CM

## 2018-05-31 DIAGNOSIS — G936 Cerebral edema: Secondary | ICD-10-CM | POA: Diagnosis present

## 2018-05-31 DIAGNOSIS — Z833 Family history of diabetes mellitus: Secondary | ICD-10-CM | POA: Diagnosis not present

## 2018-05-31 DIAGNOSIS — Z9181 History of falling: Secondary | ICD-10-CM | POA: Diagnosis not present

## 2018-05-31 DIAGNOSIS — I10 Essential (primary) hypertension: Secondary | ICD-10-CM | POA: Diagnosis present

## 2018-05-31 DIAGNOSIS — C7931 Secondary malignant neoplasm of brain: Principal | ICD-10-CM | POA: Diagnosis present

## 2018-05-31 HISTORY — PX: CRANIOTOMY: SHX93

## 2018-05-31 HISTORY — PX: APPLICATION OF CRANIAL NAVIGATION: SHX6578

## 2018-05-31 SURGERY — CRANIOTOMY TUMOR EXCISION
Anesthesia: General

## 2018-05-31 MED ORDER — ESMOLOL HCL 100 MG/10ML IV SOLN
INTRAVENOUS | Status: AC
  Filled 2018-05-31: qty 20

## 2018-05-31 MED ORDER — ARTIFICIAL TEARS OPHTHALMIC OINT
TOPICAL_OINTMENT | OPHTHALMIC | Status: DC | PRN
  Administered 2018-05-31: 1 via OPHTHALMIC

## 2018-05-31 MED ORDER — ONDANSETRON HCL 4 MG PO TABS: 8.0000 mg | ORAL_TABLET | Freq: Three times a day (TID) | ORAL | Status: DC | PRN

## 2018-05-31 MED ORDER — FENTANYL CITRATE (PF) 250 MCG/5ML IJ SOLN
INTRAMUSCULAR | Status: AC
  Filled 2018-05-31: qty 5

## 2018-05-31 MED ORDER — SODIUM CHLORIDE 0.9 % IV SOLN
INTRAVENOUS | Status: DC
  Administered 2018-05-31: 12:00:00 via INTRAVENOUS

## 2018-05-31 MED ORDER — LEVETIRACETAM IN NACL 500 MG/100ML IV SOLN
500.0000 mg | Freq: Two times a day (BID) | INTRAVENOUS | Status: DC
  Administered 2018-06-01: 500 mg via INTRAVENOUS
  Filled 2018-05-31: qty 100

## 2018-05-31 MED ORDER — MORPHINE SULFATE (PF) 2 MG/ML IV SOLN
2.0000 mg | INTRAVENOUS | Status: DC | PRN
  Administered 2018-05-31 – 2018-06-01 (×2): 2 mg via INTRAVENOUS
  Filled 2018-05-31 (×2): qty 1

## 2018-05-31 MED ORDER — BACITRACIN ZINC 500 UNIT/GM EX OINT
TOPICAL_OINTMENT | CUTANEOUS | Status: AC
  Filled 2018-05-31: qty 28.35

## 2018-05-31 MED ORDER — THROMBIN (RECOMBINANT) 5000 UNITS EX SOLR
CUTANEOUS | Status: AC
  Filled 2018-05-31: qty 5000

## 2018-05-31 MED ORDER — FENTANYL CITRATE (PF) 250 MCG/5ML IJ SOLN
INTRAMUSCULAR | Status: DC | PRN
  Administered 2018-05-31 (×3): 50 ug via INTRAVENOUS

## 2018-05-31 MED ORDER — LIDOCAINE 2% (20 MG/ML) 5 ML SYRINGE
INTRAMUSCULAR | Status: DC | PRN
  Administered 2018-05-31: 100 mg via INTRAVENOUS

## 2018-05-31 MED ORDER — CEFAZOLIN SODIUM-DEXTROSE 2-4 GM/100ML-% IV SOLN
2.0000 g | Freq: Three times a day (TID) | INTRAVENOUS | Status: AC
  Administered 2018-05-31 – 2018-06-01 (×2): 2 g via INTRAVENOUS
  Filled 2018-05-31 (×2): qty 100

## 2018-05-31 MED ORDER — ACETAMINOPHEN 325 MG PO TABS: 650.0000 mg | ORAL_TABLET | ORAL | Status: DC | PRN

## 2018-05-31 MED ORDER — DEXAMETHASONE SODIUM PHOSPHATE 4 MG/ML IJ SOLN: 4.0000 mg | Freq: Four times a day (QID) | INTRAMUSCULAR | Status: DC

## 2018-05-31 MED ORDER — LABETALOL HCL 5 MG/ML IV SOLN: 10.0000 mg | INTRAVENOUS | Status: DC | PRN

## 2018-05-31 MED ORDER — ACYCLOVIR 400 MG PO TABS
400.0000 mg | ORAL_TABLET | Freq: Two times a day (BID) | ORAL | Status: DC | PRN
  Filled 2018-05-31: qty 1

## 2018-05-31 MED ORDER — ACETAMINOPHEN 650 MG RE SUPP: 650.0000 mg | RECTAL | Status: DC | PRN

## 2018-05-31 MED ORDER — DEXAMETHASONE SODIUM PHOSPHATE 10 MG/ML IJ SOLN
6.0000 mg | Freq: Four times a day (QID) | INTRAMUSCULAR | Status: DC
  Administered 2018-05-31 – 2018-06-01 (×2): 6 mg via INTRAVENOUS
  Filled 2018-05-31 (×2): qty 1

## 2018-05-31 MED ORDER — ROCURONIUM BROMIDE 50 MG/5ML IV SOSY
PREFILLED_SYRINGE | INTRAVENOUS | Status: DC | PRN
  Administered 2018-05-31: 20 mg via INTRAVENOUS
  Administered 2018-05-31: 50 mg via INTRAVENOUS

## 2018-05-31 MED ORDER — CHLORHEXIDINE GLUCONATE CLOTH 2 % EX PADS: 6.0000 | MEDICATED_PAD | Freq: Once | CUTANEOUS | Status: DC

## 2018-05-31 MED ORDER — LIDOCAINE-PRILOCAINE 2.5-2.5 % EX CREA
1.0000 "application " | TOPICAL_CREAM | Freq: Every day | CUTANEOUS | Status: DC | PRN
  Filled 2018-05-31: qty 5

## 2018-05-31 MED ORDER — DIPHENOXYLATE-ATROPINE 2.5-0.025 MG PO TABS: 2.0000 | ORAL_TABLET | Freq: Four times a day (QID) | ORAL | Status: DC | PRN

## 2018-05-31 MED ORDER — BUPIVACAINE HCL (PF) 0.5 % IJ SOLN
INTRAMUSCULAR | Status: AC
  Filled 2018-05-31: qty 30

## 2018-05-31 MED ORDER — CEFAZOLIN SODIUM-DEXTROSE 2-4 GM/100ML-% IV SOLN
2.0000 g | INTRAVENOUS | Status: AC
  Administered 2018-05-31: 2 g via INTRAVENOUS

## 2018-05-31 MED ORDER — BACITRACIN ZINC 500 UNIT/GM EX OINT
TOPICAL_OINTMENT | CUTANEOUS | Status: DC | PRN
  Administered 2018-05-31 (×2): 1 via TOPICAL

## 2018-05-31 MED ORDER — 0.9 % SODIUM CHLORIDE (POUR BTL) OPTIME
TOPICAL | Status: DC | PRN
  Administered 2018-05-31 (×2): 1000 mL

## 2018-05-31 MED ORDER — ALPRAZOLAM 0.5 MG PO TABS
1.0000 mg | ORAL_TABLET | Freq: Every evening | ORAL | Status: DC | PRN
  Administered 2018-06-01: 1 mg via ORAL
  Filled 2018-05-31: qty 2

## 2018-05-31 MED ORDER — PROPOFOL 10 MG/ML IV BOLUS
INTRAVENOUS | Status: DC | PRN
  Administered 2018-05-31: 150 mg via INTRAVENOUS
  Administered 2018-05-31: 50 mg via INTRAVENOUS

## 2018-05-31 MED ORDER — ESMOLOL HCL 100 MG/10ML IV SOLN
INTRAVENOUS | Status: DC | PRN
  Administered 2018-05-31 (×2): 50 mg via INTRAVENOUS

## 2018-05-31 MED ORDER — HYDROCODONE-ACETAMINOPHEN 5-325 MG PO TABS
1.0000 | ORAL_TABLET | ORAL | Status: DC | PRN
  Administered 2018-05-31 – 2018-06-01 (×2): 1 via ORAL
  Filled 2018-05-31 (×2): qty 1

## 2018-05-31 MED ORDER — SUGAMMADEX SODIUM 200 MG/2ML IV SOLN
INTRAVENOUS | Status: DC | PRN
  Administered 2018-05-31: 143.8 mg via INTRAVENOUS

## 2018-05-31 MED ORDER — DEXAMETHASONE 4 MG PO TABS: 4.0000 mg | ORAL_TABLET | Freq: Four times a day (QID) | ORAL | Status: DC

## 2018-05-31 MED ORDER — ESTRADIOL 0.075 MG/24HR TD PTWK
0.0750 mg | MEDICATED_PATCH | TRANSDERMAL | Status: DC
  Filled 2018-05-31: qty 1

## 2018-05-31 MED ORDER — POLYETHYLENE GLYCOL 3350 17 G PO PACK: 17.0000 g | PACK | Freq: Every day | ORAL | Status: DC | PRN

## 2018-05-31 MED ORDER — THROMBIN (RECOMBINANT) 20000 UNITS EX SOLR
CUTANEOUS | Status: AC
  Filled 2018-05-31: qty 20000

## 2018-05-31 MED ORDER — PROCHLORPERAZINE MALEATE 10 MG PO TABS
10.0000 mg | ORAL_TABLET | Freq: Four times a day (QID) | ORAL | Status: DC | PRN
  Filled 2018-05-31: qty 1

## 2018-05-31 MED ORDER — FENTANYL CITRATE (PF) 100 MCG/2ML IJ SOLN
INTRAMUSCULAR | Status: AC
  Filled 2018-05-31: qty 2

## 2018-05-31 MED ORDER — LEVETIRACETAM IN NACL 500 MG/100ML IV SOLN
500.0000 mg | INTRAVENOUS | Status: AC
  Administered 2018-05-31: 500 mg via INTRAVENOUS
  Filled 2018-05-31: qty 100

## 2018-05-31 MED ORDER — LIDOCAINE-EPINEPHRINE 1 %-1:100000 IJ SOLN
INTRAMUSCULAR | Status: DC | PRN
  Administered 2018-05-31: 10 mL via INTRADERMAL

## 2018-05-31 MED ORDER — PANTOPRAZOLE SODIUM 20 MG PO TBEC: 20.0000 mg | DELAYED_RELEASE_TABLET | Freq: Every day | ORAL | Status: DC

## 2018-05-31 MED ORDER — CEFAZOLIN SODIUM-DEXTROSE 2-4 GM/100ML-% IV SOLN
INTRAVENOUS | Status: AC
  Filled 2018-05-31: qty 100

## 2018-05-31 MED ORDER — SODIUM CHLORIDE 0.9 % IV SOLN
INTRAVENOUS | Status: DC | PRN
  Administered 2018-05-31: 14:00:00 via INTRAVENOUS

## 2018-05-31 MED ORDER — DEXAMETHASONE SODIUM PHOSPHATE 10 MG/ML IJ SOLN
INTRAMUSCULAR | Status: DC | PRN
  Administered 2018-05-31: 10 mg via INTRAVENOUS

## 2018-05-31 MED ORDER — ONDANSETRON HCL 4 MG/2ML IJ SOLN
4.0000 mg | INTRAMUSCULAR | Status: DC | PRN
  Filled 2018-05-31: qty 2

## 2018-05-31 MED ORDER — THROMBIN 5000 UNITS EX SOLR
OROMUCOSAL | Status: DC | PRN
  Administered 2018-05-31: 15:00:00 via TOPICAL

## 2018-05-31 MED ORDER — ONDANSETRON HCL 4 MG PO TABS
4.0000 mg | ORAL_TABLET | ORAL | Status: DC | PRN
  Administered 2018-05-31: 4 mg via ORAL
  Filled 2018-05-31: qty 1

## 2018-05-31 MED ORDER — HYDROCODONE-ACETAMINOPHEN 10-325 MG PO TABS: 1.0000 | ORAL_TABLET | ORAL | Status: DC | PRN

## 2018-05-31 MED ORDER — FLEET ENEMA 7-19 GM/118ML RE ENEM: 1.0000 | ENEMA | Freq: Once | RECTAL | Status: DC | PRN

## 2018-05-31 MED ORDER — OXYCODONE HCL ER 10 MG PO T12A
10.0000 mg | EXTENDED_RELEASE_TABLET | Freq: Two times a day (BID) | ORAL | Status: DC
  Administered 2018-05-31 – 2018-06-01 (×2): 10 mg via ORAL
  Filled 2018-05-31 (×2): qty 1

## 2018-05-31 MED ORDER — DEXAMETHASONE SODIUM PHOSPHATE 4 MG/ML IJ SOLN: 4.0000 mg | Freq: Three times a day (TID) | INTRAMUSCULAR | Status: DC

## 2018-05-31 MED ORDER — PROGESTERONE MICRONIZED 100 MG PO CAPS
100.0000 mg | ORAL_CAPSULE | Freq: Every day | ORAL | Status: DC
  Administered 2018-05-31: 100 mg via ORAL
  Filled 2018-05-31: qty 1

## 2018-05-31 MED ORDER — HEMOSTATIC AGENTS (NO CHARGE) OPTIME
TOPICAL | Status: DC | PRN
  Administered 2018-05-31: 1 via TOPICAL

## 2018-05-31 MED ORDER — LORAZEPAM 0.5 MG PO TABS: 0.5000 mg | ORAL_TABLET | Freq: Three times a day (TID) | ORAL | Status: DC | PRN

## 2018-05-31 MED ORDER — BUPIVACAINE HCL (PF) 0.5 % IJ SOLN
INTRAMUSCULAR | Status: DC | PRN
  Administered 2018-05-31: 10 mL

## 2018-05-31 MED ORDER — ONDANSETRON HCL 4 MG/2ML IJ SOLN
INTRAMUSCULAR | Status: DC | PRN
  Administered 2018-05-31: 4 mg via INTRAVENOUS

## 2018-05-31 MED ORDER — PROMETHAZINE HCL 25 MG PO TABS: 12.5000 mg | ORAL_TABLET | ORAL | Status: DC | PRN

## 2018-05-31 MED ORDER — LIDOCAINE-EPINEPHRINE 1 %-1:100000 IJ SOLN
INTRAMUSCULAR | Status: AC
  Filled 2018-05-31: qty 1

## 2018-05-31 MED ORDER — SODIUM CHLORIDE 0.9 % IV SOLN
INTRAVENOUS | Status: DC | PRN
  Administered 2018-05-31: 35 ug/min via INTRAVENOUS

## 2018-05-31 MED ORDER — POTASSIUM CHLORIDE IN NACL 20-0.9 MEQ/L-% IV SOLN
INTRAVENOUS | Status: DC
  Administered 2018-05-31: 19:00:00 via INTRAVENOUS
  Filled 2018-05-31 (×2): qty 1000

## 2018-05-31 MED ORDER — THROMBIN 20000 UNITS EX SOLR
CUTANEOUS | Status: DC | PRN
  Administered 2018-05-31: 15:00:00 via TOPICAL

## 2018-05-31 MED ORDER — BISACODYL 10 MG RE SUPP: 10.0000 mg | Freq: Every day | RECTAL | Status: DC | PRN

## 2018-05-31 MED ORDER — PANTOPRAZOLE SODIUM 40 MG IV SOLR
40.0000 mg | Freq: Every day | INTRAVENOUS | Status: DC
  Administered 2018-05-31: 40 mg via INTRAVENOUS
  Filled 2018-05-31: qty 40

## 2018-05-31 MED ORDER — HYDROMORPHONE HCL 2 MG PO TABS
4.0000 mg | ORAL_TABLET | ORAL | Status: DC | PRN
  Administered 2018-05-31: 8 mg via ORAL
  Filled 2018-05-31: qty 4

## 2018-05-31 MED ORDER — DOCUSATE SODIUM 100 MG PO CAPS
100.0000 mg | ORAL_CAPSULE | Freq: Two times a day (BID) | ORAL | Status: DC
  Administered 2018-05-31 – 2018-06-01 (×2): 100 mg via ORAL
  Filled 2018-05-31 (×2): qty 1

## 2018-05-31 MED ORDER — FENTANYL CITRATE (PF) 100 MCG/2ML IJ SOLN
25.0000 ug | INTRAMUSCULAR | Status: DC | PRN
  Administered 2018-05-31: 50 ug via INTRAVENOUS

## 2018-05-31 SURGICAL SUPPLY — 82 items
BATTERY IQ STERILE (MISCELLANEOUS) ×1 IMPLANT
BIT DRILL WIRE PASS 1.3MM (BIT) IMPLANT
BLADE CLIPPER SURG (BLADE) ×3 IMPLANT
BNDG CMPR 75X41 PLY HI ABS (GAUZE/BANDAGES/DRESSINGS)
BNDG STRETCH 4X75 STRL LF (GAUZE/BANDAGES/DRESSINGS) ×4 IMPLANT
BTRY SRG DRVR 1.5 IQ (MISCELLANEOUS) ×2
BUR ACORN 6.0 PRECISION (BURR) ×3 IMPLANT
BUR SPIRAL ROUTER 2.3 (BUR) ×3 IMPLANT
CANISTER SUCT 3000ML PPV (MISCELLANEOUS) ×4 IMPLANT
CARTRIDGE OIL MAESTRO DRILL (MISCELLANEOUS) ×2 IMPLANT
CLIP VESOCCLUDE MED 6/CT (CLIP) IMPLANT
CONT SPEC 4OZ CLIKSEAL STRL BL (MISCELLANEOUS) ×4 IMPLANT
COVER MAYO STAND STRL (DRAPES) IMPLANT
COVER WAND RF STERILE (DRAPES) ×2 IMPLANT
DECANTER SPIKE VIAL GLASS SM (MISCELLANEOUS) ×3 IMPLANT
DIFFUSER DRILL AIR PNEUMATIC (MISCELLANEOUS) ×3 IMPLANT
DRAPE MICROSCOPE LEICA (MISCELLANEOUS) ×1 IMPLANT
DRAPE NEUROLOGICAL W/INCISE (DRAPES) ×3 IMPLANT
DRAPE STERI IOBAN 125X83 (DRAPES) IMPLANT
DRAPE WARM FLUID 44X44 (DRAPE) ×3 IMPLANT
DRILL WIRE PASS 1.3MM (BIT)
DRSG TEGADERM 2-3/8X2-3/4 SM (GAUZE/BANDAGES/DRESSINGS) ×3 IMPLANT
DRSG TELFA 3X8 NADH (GAUZE/BANDAGES/DRESSINGS) ×3 IMPLANT
DURAMATRIX ONLAY 2X2 (Neuro Prosthesis/Implant) ×1 IMPLANT
DURAPREP 6ML APPLICATOR 50/CS (WOUND CARE) ×3 IMPLANT
ELECT REM PT RETURN 9FT ADLT (ELECTROSURGICAL) ×3
ELECTRODE REM PT RTRN 9FT ADLT (ELECTROSURGICAL) ×2 IMPLANT
EVACUATOR 1/8 PVC DRAIN (DRAIN) IMPLANT
EVACUATOR SILICONE 100CC (DRAIN) IMPLANT
FORCEPS BIPOLAR SPETZLER 8 1.0 (NEUROSURGERY SUPPLIES) ×1 IMPLANT
GAUZE 4X4 16PLY RFD (DISPOSABLE) IMPLANT
GAUZE SPONGE 4X4 12PLY STRL (GAUZE/BANDAGES/DRESSINGS) ×2 IMPLANT
GLOVE BIO SURGEON STRL SZ8 (GLOVE) ×4 IMPLANT
GLOVE BIOGEL PI IND STRL 8 (GLOVE) ×2 IMPLANT
GLOVE BIOGEL PI IND STRL 8.5 (GLOVE) ×2 IMPLANT
GLOVE BIOGEL PI INDICATOR 8 (GLOVE) ×3
GLOVE BIOGEL PI INDICATOR 8.5 (GLOVE) ×2
GLOVE ECLIPSE 7.5 STRL STRAW (GLOVE) ×2 IMPLANT
GLOVE ECLIPSE 8.0 STRL XLNG CF (GLOVE) ×3 IMPLANT
GLOVE EXAM NITRILE XL STR (GLOVE) IMPLANT
GOWN STRL REUS W/ TWL LRG LVL3 (GOWN DISPOSABLE) IMPLANT
GOWN STRL REUS W/ TWL XL LVL3 (GOWN DISPOSABLE) IMPLANT
GOWN STRL REUS W/TWL 2XL LVL3 (GOWN DISPOSABLE) ×1 IMPLANT
GOWN STRL REUS W/TWL LRG LVL3 (GOWN DISPOSABLE) ×6
GOWN STRL REUS W/TWL XL LVL3 (GOWN DISPOSABLE) ×3
HEMOSTAT SURGICEL 2X14 (HEMOSTASIS) ×3 IMPLANT
KIT BASIN OR (CUSTOM PROCEDURE TRAY) ×3 IMPLANT
KIT TURNOVER KIT B (KITS) ×3 IMPLANT
MARKER SKIN DUAL TIP RULER LAB (MISCELLANEOUS) ×3 IMPLANT
MARKER SPHERE PSV REFLC 13MM (MARKER) ×6 IMPLANT
NDL HYPO 25X1 1.5 SAFETY (NEEDLE) ×2 IMPLANT
NEEDLE HYPO 25X1 1.5 SAFETY (NEEDLE) ×3 IMPLANT
NS IRRIG 1000ML POUR BTL (IV SOLUTION) ×6 IMPLANT
OIL CARTRIDGE MAESTRO DRILL (MISCELLANEOUS) ×3
PACK CRANIOTOMY CUSTOM (CUSTOM PROCEDURE TRAY) ×3 IMPLANT
PAD ARMBOARD 7.5X6 YLW CONV (MISCELLANEOUS) ×4 IMPLANT
PAD DRESSING TELFA 3X8 NADH (GAUZE/BANDAGES/DRESSINGS) IMPLANT
PATTIES SURGICAL .25X.25 (GAUZE/BANDAGES/DRESSINGS) IMPLANT
PATTIES SURGICAL .5 X.5 (GAUZE/BANDAGES/DRESSINGS) IMPLANT
PATTIES SURGICAL .5 X3 (DISPOSABLE) IMPLANT
PATTIES SURGICAL 1/4 X 3 (GAUZE/BANDAGES/DRESSINGS) IMPLANT
PATTIES SURGICAL 1X1 (DISPOSABLE) IMPLANT
PIN MAYFIELD SKULL DISP (PIN) ×3 IMPLANT
PLATE 1.5/0.5 18.5MM BURR HOLE (Plate) ×4 IMPLANT
RUBBERBAND STERILE (MISCELLANEOUS) ×3 IMPLANT
SCREW SELF DRILL HT 1.5/4MM (Screw) ×16 IMPLANT
SLEEVE SURGEON STRL (DRAPES) ×1 IMPLANT
SPECIMEN JAR SMALL (MISCELLANEOUS) IMPLANT
SPONGE NEURO XRAY DETECT 1X3 (DISPOSABLE) IMPLANT
SPONGE SURGIFOAM ABS GEL 100 (HEMOSTASIS) ×3 IMPLANT
STAPLER SKIN PROX WIDE 3.9 (STAPLE) ×3 IMPLANT
SUT ETHILON 3 0 FSL (SUTURE) IMPLANT
SUT NURALON 4 0 TR CR/8 (SUTURE) ×8 IMPLANT
SUT SILK 2 0 PERMA HAND 18 BK (SUTURE) IMPLANT
SUT VIC AB 2-0 CP2 18 (SUTURE) ×6 IMPLANT
SYR CONTROL 10ML LL (SYRINGE) ×2 IMPLANT
TOWEL GREEN STERILE (TOWEL DISPOSABLE) ×3 IMPLANT
TOWEL GREEN STERILE FF (TOWEL DISPOSABLE) ×3 IMPLANT
TRAY FOLEY MTR SLVR 16FR STAT (SET/KITS/TRAYS/PACK) ×3 IMPLANT
TUBE CONNECTING 12X1/4 (SUCTIONS) ×3 IMPLANT
UNDERPAD 30X30 (UNDERPADS AND DIAPERS) ×3 IMPLANT
WATER STERILE IRR 1000ML POUR (IV SOLUTION) ×3 IMPLANT

## 2018-05-31 NOTE — Anesthesia Postprocedure Evaluation (Signed)
Anesthesia Post Note  Patient: [name]  Procedure(s) Performed: Left Parietal craniotomy for tumor excision with brainlab (Left ) APPLICATION OF CRANIAL NAVIGATION (N/A )     Patient location during evaluation: PACU Anesthesia Type: General Level of consciousness: awake Pain management: pain level controlled Vital Signs Assessment: post-procedure vital signs reviewed and stable Respiratory status: spontaneous breathing Cardiovascular status: stable Postop Assessment: no backache Anesthetic complications: no    Last Vitals:  Vitals:   05/31/18 1637 05/31/18 1705  BP: 126/70 124/73  Pulse: 89 70  Resp: 14 11  Temp: 36.4 C   SpO2: 99% 99%    Last Pain:  Vitals:   05/31/18 1154  TempSrc:   PainSc: 0-No pain                 Daleigh Pollinger

## 2018-05-31 NOTE — Brief Op Note (Signed)
05/31/2018  4:47 PM  PATIENT:  [name]  57 y.o. 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  PROCEDURE:  Procedure(s) with comments: Left Parietal craniotomy for tumor excision with brainlab (Left) - Left Parietal craniotomy for tumor excision with brainlab APPLICATION OF CRANIAL NAVIGATION (N/A) with microdissection  SURGEON:  Surgeon(s) and Role:    Erline Levine, MD - Primary  PHYSICIAN ASSISTANT:   ASSISTANTS: Poteat, RN   ANESTHESIA:   general  EBL:  100 mL   BLOOD ADMINISTERED:none  DRAINS: none   LOCAL MEDICATIONS USED:  MARCAINE    and LIDOCAINE   SPECIMEN:  Excision  DISPOSITION OF SPECIMEN:  PATHOLOGY  COUNTS:  YES  TOURNIQUET:  * No tourniquets in log *  DICTATION: Patient is 57 year old woman with rectal cancer with left parietal brain metastasis. She has developed a right hemiparesis.  It was elected to take her to surgery for craniotomy for left parietal brain tumor.  She had a BrainLab MRI for surgical localization of tumor and had undergone preoperative Stereotactic radiosurgery to the mass one day prior.  Procedure:  Following smooth intubation, patient was placed in prone position on chest rolls.  Head was placed in pins and left posterior parietal scalp was shaved and prepped and draped in usual sterile fashion after BrainLab MRI was localized to map tumor location.  Area of planned incision was infiltrated with lidocaine. A linear parasagittal incision was made and carried  to expose calvarium.  Skull flap was elevated exposing the dura directly overlying the brain mass.  Dura was opened.  A corticotomy was created in the parietal lobe and carried to remove the brain metastasis.  The Brainlab and microscope were used to confirm extent of tumor resection.  Hemostasis was assured with  irrigation and surgifoam.   Hemostasis was assured and the tumor cavity was lined with Surgicell. Probe confirmed extent of tumor resection. The dura was closed with 4-0 neurilon sutures along with Dura Matrix graft. The bone flap was replaced with plates, the fascia and galea were closed with 2-0 vicryl sutures and the skin was re approximated with staples.  A sterile occlusive dressing was placed.  Patient was returned to a supine position and taken out of head pins, then extubated in the operating room, having tolerated surgery well.  Counts were correct at the end of the case.  PLAN OF CARE: Admit to inpatient   PATIENT DISPOSITION:  PACU - hemodynamically stable.   Delay start of Pharmacological VTE agent (>24hrs) due to surgical blood loss or risk of bleeding: yes

## 2018-05-31 NOTE — Op Note (Signed)
05/31/2018  4:47 PM  PATIENT:  [name]  57 y.o. 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  PROCEDURE:  Procedure(s) with comments: Left Parietal craniotomy for tumor excision with brainlab (Left) - Left Parietal craniotomy for tumor excision with brainlab APPLICATION OF CRANIAL NAVIGATION (N/A) with microdissection  SURGEON:  Surgeon(s) and Role:    Erline Levine, MD - Primary  PHYSICIAN ASSISTANT:   ASSISTANTS: Poteat, RN   ANESTHESIA:   general  EBL:  100 mL   BLOOD ADMINISTERED:none  DRAINS: none   LOCAL MEDICATIONS USED:  MARCAINE    and LIDOCAINE   SPECIMEN:  Excision  DISPOSITION OF SPECIMEN:  PATHOLOGY  COUNTS:  YES  TOURNIQUET:  * No tourniquets in log *  DICTATION: Patient is 57 year old woman with rectal cancer with left parietal brain metastasis. She has developed a right hemiparesis.  It was elected to take her to surgery for craniotomy for left parietal brain tumor.  She had a BrainLab MRI for surgical localization of tumor and had undergone preoperative Stereotactic radiosurgery to the mass one day prior.  Procedure:  Following smooth intubation, patient was placed in prone position on chest rolls.  Head was placed in pins and left posterior parietal scalp was shaved and prepped and draped in usual sterile fashion after BrainLab MRI was localized to map tumor location.  Area of planned incision was infiltrated with lidocaine. A linear parasagittal incision was made and carried  to expose calvarium.  Skull flap was elevated exposing the dura directly overlying the brain mass.  Dura was opened.  A corticotomy was created in the parietal lobe and carried to remove the brain metastasis.  The Brainlab and microscope were used to confirm extent of tumor resection.  Hemostasis was assured with  irrigation and surgifoam.   Hemostasis was assured and the tumor cavity was lined with Surgicell. Probe confirmed extent of tumor resection. The dura was closed with 4-0 neurilon sutures along with Dura Matrix graft. The bone flap was replaced with plates, the fascia and galea were closed with 2-0 vicryl sutures and the skin was re approximated with staples.  A sterile occlusive dressing was placed.  Patient was returned to a supine position and taken out of head pins, then extubated in the operating room, having tolerated surgery well.  Counts were correct at the end of the case.  PLAN OF CARE: Admit to inpatient   PATIENT DISPOSITION:  PACU - hemodynamically stable.   Delay start of Pharmacological VTE agent (>24hrs) due to surgical blood loss or risk of bleeding: yes

## 2018-05-31 NOTE — Progress Notes (Signed)
Patient is awake, alert, conversant.  MAEW without weakness or numbness.  Patient has minimal discomfort.  Doing well.

## 2018-05-31 NOTE — Anesthesia Procedure Notes (Signed)
Arterial Line Insertion Start/End12/05/2018 12:25 PM, 05/31/2018 12:30 PM Performed by: Colin Benton, CRNA, CRNA  Patient location: Pre-op. Preanesthetic checklist: patient identified, IV checked, site marked, risks and benefits discussed, surgical consent, monitors and equipment checked, pre-op evaluation, timeout performed and anesthesia consent Lidocaine 1% used for infiltration Right, radial was placed Catheter size: 20 G Hand hygiene performed , maximum sterile barriers used  and Seldinger technique used Allen's test indicative of satisfactory collateral circulation Attempts: 1 Procedure performed without using ultrasound guided technique. Following insertion, dressing applied and Biopatch. Post procedure assessment: normal and unchanged  Patient tolerated the procedure well with no immediate complications.

## 2018-05-31 NOTE — Transfer of Care (Signed)
Immediate Anesthesia Transfer of Care Note  Patient: [name]  Procedure(s) Performed: Left Parietal craniotomy for tumor excision with brainlab (Left ) APPLICATION OF CRANIAL NAVIGATION (N/A )  Patient Location: PACU  Anesthesia Type:General  Level of Consciousness: awake, alert  and oriented  Airway & Oxygen Therapy: Patient Spontanous Breathing  Post-op Assessment: Report given to RN and Post -op Vital signs reviewed and stable  Post vital signs: Reviewed and stable  Last Vitals:  Vitals Value Taken Time  BP    Temp    Pulse    Resp    SpO2      Last Pain:  Vitals:   05/31/18 1154  TempSrc:   PainSc: 0-No pain      Patients Stated Pain Goal: 5 (15/86/82 5749)  Complications: No apparent anesthesia complications

## 2018-05-31 NOTE — H&P (Signed)
Patient ID:   [id number] Patient: [name]  Date of Birth: [birth year] Visit Type: Office Visit   Date: 05/28/2018 02:00 PM Provider: Marchia Meiers. Vertell Limber MD   This 57 year old female presents for Brain tumor.  HISTORY OF PRESENT ILLNESS:  1.  Brain tumor  [name], 57 year old female, visits to discuss stereotactic radio surgery.  She underwent resection of rectal cancer in 2015. 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  She noted right lower extremity pain and weakness as well as right greater than left upper extremity weakness increasing since June.  Episodes of confusion over the last 2 months.  She notes she was wheelchair bound for mobility 2 weeks ago.  Decadron has alleviated these symptoms except for some mild blurry vision. Patient notes nausea and emesis with chemo - she stopped chemo this week and will restart next week.  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 Annette Stable 2013, L4-5, L5-S1 laminotomies, rectal cancer resection 2015, sacral nerve stimulator, left nephrectomy November 2018          PAST MEDICAL/SURGICAL HISTORY:   (Reviewed, updated)    Disease/disorder Onset Date Management Date Comments    Appendectomy 2015     Colon/rectal resection 2015   Anxiety      Cancer, rectal         Family History:  (Reviewed, updated) Relationship Family Member Name Deceased Age at Death Condition Onset Age Cause of Death      Family history of Hypertension  N      Family history of Diabetes mellitus  N     Social History:  (Reviewed, updated) Preferred language is Unknown.   Tobacco use status: Never smoked tobacco. Smoking status: Never smoker.  HOME ENVIRONMENT/SAFETY The patient is not at risk for falls. The patient has  fallen 2 times in the last year.      MEDICATIONS: (added, continued or stopped this visit) Started Medication Directions Instruction Stopped   hydrochlorothiazide 25 mg tablet take 1 tablet by oral route  every day     Norco 5 mg-325 mg tablet take 1 tablet by oral route  every 6 hours as needed for pain     progesterone micronized 200 mg capsule take 1 capsule by oral route  every day for 12 days in the evening sequentially per 28 day cycle     sertraline 50 mg tablet take 1 tablet by oral route  every day     Vivelle-Dot 0.05 mg/24 hr transdermal patch apply 1 patch by transdermal route 2 times every week     Xanax 0.5 mg tablet take 1 tablet by oral route 3 times every day       ALLERGIES: Ingredient Reaction Medication Name Comment  NO KNOWN ALLERGIES     No known allergies.    PHYSICAL EXAM:   Vitals Date Temp F BP Pulse Ht In Wt Lb BMI BSA Pain Score  05/28/2018  136/81 88 64 159 27.29  0/10    PHYSICAL EXAM Details General Level of Distress: no acute distress Overall Appearance: normal  Head and Face  Right Left  Fundoscopic Exam:  normal normal    Cardiovascular Cardiac: regular rate and rhythm without murmur  Right Left  Carotid Pulses: normal normal  Respiratory Lungs: clear to auscultation  Neurological Orientation: normal Recent and Remote Memory:  normal Attention Span and Concentration:   normal Language: normal Fund of Knowledge: normal  Right Left Sensation: normal normal Upper Extremity Coordination: normal normal  Lower Extremity Coordination: normal normal  Musculoskeletal Gait and Station: normal  Right Left Upper Extremity Muscle Strength: normal normal Lower Extremity Muscle Strength: normal normal Upper Extremity Muscle Tone:  normal normal Lower Extremity Muscle Tone: normal normal   Motor Strength Upper and lower extremity motor strength was tested in the clinically pertinent muscles.     Deep Tendon  Reflexes  Right Left Biceps: normal normal Triceps: normal normal Brachioradialis: normal normal Patellar: normal normal Achilles: normal normal  Cranial Nerves II. Optic Nerve/Visual Fields: normal III. Oculomotor: normal IV. Trochlear: normal V. Trigeminal: normal VI. Abducens: normal VII. Facial: normal VIII. Acoustic/Vestibular: normal IX. Glossopharyngeal: normal X. Vagus: normal XI. Spinal Accessory: normal XII. Hypoglossal: normal  Motor and other Tests Lhermittes: negative Rhomberg: negative Pronator drift: absent     Right Left Hoffman's: normal normal Clonus: normal normal Babinski: normal normal   Additional Findings:  Upon examination, no loss of peripheral vision, no pronator drift, no difficulties with coordination, reflexes symmetric.   DIAGNOSTIC RESULTS:   05/21/18 Head MRI with and without contrast 3 cm solitary left parietal metastasis with extensive vasogenic edema.    IMPRESSION:   Head MRI with and without contrast reveals 3 cm solitary left parietal metastasis with extensive vasogenic edema.  Patient notes significant improvement of symptoms with Decadron. Discussed methodology of surgery with patient. Patient wished to discuss what pain levels she could expect with this surgery - patient previously had a nephrectomy performed - advised patient that her pain would likely be less severe than her previous nephrectomy. Discussed risks of surgery and recovery. Advised patient she would need to avoid tight head coverings such as wigs or hats after surgery for some time. Advised patient she would likely begin to taper of Decadron 1-2 weeks after surgery.  Patient will return for [facility] on 05/30/18 followed by surgery on 05/31/18.  Upon examination, no loss of peripheral vision, no pronator drift, no difficulties with coordination, reflexes symmetric.  PLAN:  1. Gantt scheduled for 05/30/18 followed by surgery on 05/31/18 2. Follow-up after  surgery  Orders: Instruction(s)/Education: Assessment Instruction  I10 Hypertension education  Z68.27 Lifestyle education regarding diet   Completed Orders (this encounter) Order Details Reason Side Interpretation Result Initial Treatment Date Region  Hypertension education Continue to monitor blood pressure , if blood pressure continues elevated contact primary care        Lifestyle education regarding diet Patient encouraged to eat a well balance diet         Assessment/Plan   # Detail Type Description   1. Assessment Essential (primary) hypertension (I10).       2. Assessment Body mass index (BMI) 27.0-27.9, adult (B09.62).   Plan Orders Today's instructions / counseling include(s) Lifestyle education regarding diet. Clinical information/comments: Patient encouraged to eat a well balance diet.         Pain Management Plan Pain Scale: 0/10. Method: Numeric Pain Intensity Scale. Location: brain. Onset: 05/14/2018. Duration: varies. Quality: discomforting. Pain management follow-up plan of care: Patient taken medication as prescribed.  Fall Risk Plan The patient has fallen 2 times in the last year.               Provider:  Marchia Meiers. Vertell Limber MD  05/28/2018 07:11 PM Dictation edited by: Mirian Mo    CC Providers: [place]  [address] [place],  [place]  [address]   Henry Pool MD  [address] [place], [place] [address]               Electronically signed by Marchia Meiers. Vertell Limber MD on 05/31/2018 07:38 AM

## 2018-05-31 NOTE — Anesthesia Procedure Notes (Signed)
Procedure Name: Intubation Date/Time: 05/31/2018 2:30 PM Performed by: Renato Shin, CRNA Pre-anesthesia Checklist: Patient identified, Emergency Drugs available, Suction available and Patient being monitored Patient Re-evaluated:Patient Re-evaluated prior to induction Oxygen Delivery Method: Circle system utilized Preoxygenation: Pre-oxygenation with 100% oxygen Induction Type: IV induction Ventilation: Mask ventilation without difficulty Laryngoscope Size: Miller and 2 Grade View: Grade I Tube type: Oral Tube size: 7.0 mm Number of attempts: 1 Airway Equipment and Method: Stylet Placement Confirmation: ETT inserted through vocal cords under direct vision,  positive ETCO2 and breath sounds checked- equal and bilateral Secured at: 21 cm Tube secured with: Tape Dental Injury: Teeth and Oropharynx as per pre-operative assessment

## 2018-05-31 NOTE — Anesthesia Preprocedure Evaluation (Signed)
Anesthesia Evaluation  Patient identified by MRN, date of birth, ID band Patient awake  General Assessment Comment:History noted. CG  History of Anesthesia Complications (+) PONV  Airway Mallampati: II  TM Distance: >3 FB     Dental   Pulmonary neg pulmonary ROS,    breath sounds clear to auscultation       Cardiovascular hypertension,  Rhythm:Regular Rate:Normal     Neuro/Psych    GI/Hepatic negative GI ROS,   Endo/Other  negative endocrine ROS  Renal/GU negative Renal ROS     Musculoskeletal  (+) Arthritis ,   Abdominal   Peds  Hematology   Anesthesia Other Findings   Reproductive/Obstetrics                             Anesthesia Physical Anesthesia Plan  ASA: III  Anesthesia Plan: General   Post-op Pain Management:    Induction: Intravenous  PONV Risk Score and Plan: 4 or greater and Treatment may vary due to age or medical condition, Ondansetron, Dexamethasone and Midazolam  Airway Management Planned:   Additional Equipment:   Intra-op Plan:   Post-operative Plan: Possible Post-op intubation/ventilation  Informed Consent: I have reviewed the patients History and Physical, chart, labs and discussed the procedure including the risks, benefits and alternatives for the proposed anesthesia with the patient or authorized representative who has indicated his/her understanding and acceptance.   Dental advisory given  Plan Discussed with: Anesthesiologist and CRNA  Anesthesia Plan Comments:         Anesthesia Quick Evaluation

## 2018-05-31 NOTE — Interval H&P Note (Signed)
History and Physical Interval Note:  05/31/2018 1:38 PM  [name]  has presented today for surgery, with the diagnosis of Brain tumor  The various methods of treatment have been discussed with the patient and family. After consideration of risks, benefits and other options for treatment, the patient has consented to  Procedure(s) with comments: Left Parietal craniotomy for tumor excision with brainlab (Left) - Left Parietal craniotomy for tumor excision with brainlab [place] (N/A) as a surgical intervention .  [name] history has been reviewed, patient examined, no change in status, stable for surgery.  I have reviewed the patient's chart and labs.  Questions were answered to the patient's satisfaction.     [name]

## 2018-06-01 ENCOUNTER — Inpatient Hospital Stay (HOSPITAL_COMMUNITY): Payer: 59

## 2018-06-01 ENCOUNTER — Encounter (HOSPITAL_COMMUNITY): Payer: Self-pay | Admitting: Neurosurgery

## 2018-06-01 MED ORDER — DEXAMETHASONE 4 MG PO TABS: ORAL_TABLET | ORAL | 0 refills | Status: DC

## 2018-06-01 MED ORDER — GADOBUTROL 1 MMOL/ML IV SOLN
7.5000 mL | Freq: Once | INTRAVENOUS | Status: AC | PRN
  Administered 2018-06-01: 7.5 mL via INTRAVENOUS

## 2018-06-01 MED ORDER — DEXAMETHASONE 6 MG PO TABS
6.0000 mg | ORAL_TABLET | Freq: Four times a day (QID) | ORAL | Status: DC
  Administered 2018-06-01: 6 mg via ORAL
  Filled 2018-06-01: qty 1

## 2018-06-01 MED FILL — Thrombin (Recombinant) For Soln 20000 Unit: CUTANEOUS | Qty: 1 | Status: AC

## 2018-06-01 MED FILL — Thrombin (Recombinant) For Soln 5000 Unit: CUTANEOUS | Qty: 5000 | Status: AC

## 2018-06-01 NOTE — Progress Notes (Signed)
Subjective: Patient reports "I feel great!"  Objective: Vital signs in last 24 hours: Temp:  [97.6 F (36.4 C)-99 F (37.2 C)] 98.9 F (37.2 C) (12/13 0400) Pulse Rate:  [69-89] 70 (12/13 0700) Resp:  [10-27] 10 (12/13 0300) BP: (100-151)/(53-99) 115/72 (12/13 0700) SpO2:  [93 %-100 %] 95 % (12/13 0700) Arterial Line BP: (87-155)/(58-81) 106/66 (12/13 0700) Weight:  [71.9 kg-74.2 kg] 74.2 kg (12/12 1800)  Intake/Output from previous day: 12/12 0701 - 12/13 0700 In: 2823.8 [P.O.:720; I.V.:1903.8; IV Piggyback:200] Out: 7494 [Urine:3475; Blood:100] Intake/Output this shift: No intake/output data recorded.  Physical Exam: Normal exam.  Minimal drainage on dressing.  Lab Results: No results for input(s): WBC, HGB, HCT, PLT in the last 72 hours. BMET No results for input(s): NA, K, CL, CO2, GLUCOSE, BUN, CREATININE, CALCIUM in the last 72 hours.  Studies/Results: No results found.  Assessment/Plan: Mobilize this am.  F/U MRI and D/C home if doing well later today.    LOS: 1 day    [name], MD 06/01/2018, 7:48 AM

## 2018-06-01 NOTE — Plan of Care (Signed)
Pt adequate and stable for discharge. Tolerating PO, pain under control. Voiding with no difficulties. No change in neuro status.

## 2018-06-01 NOTE — Discharge Summary (Signed)
Physician Discharge Summary  Patient ID: WHITLEE SLUDER MRN: [id number] DOB/AGE: 55-May-1962 55 y.o.  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 Following uncomplicated surgery, she recovered nicely and transferred to Neuro ICU for nursing care. She is mobilizing well.   Consults: None  Significant Diagnostic Studies:   Treatments: surgery: Left Parietal craniotomy for tumor excision with brainlab (Left) - Left Parietal craniotomy for tumor excision with brainlab APPLICATION OF CRANIAL NAVIGATION (N/A) with microdissection    Discharge Exam: Blood pressure 121/68, pulse 80, temperature 97.9 F (36.6 C), temperature source Axillary, resp. rate 14, height 5\' 4"  (1.626 m), weight 74.2 kg, SpO2 97 %. Alert and conversant without c/o pain or discomfort. MAEW. PEARL. Drsg intact with small amount old drainage on Telfa.   Disposition: Discharge to home. Ok to remove dressing 1-2 days. Leave staples clean and dry. Ok to shower. Return to office in 2 weeks for staple removal. Norco 5/325 will be eRx'ed to pts pharmacy from office for prn home use. Pt has Decadron at home and will taper as follows (note to pt): 4mg  tid x5days, then 4mg  bid x5days then 4mg  1 daily x5days to end. Allergies as of 06/01/2018   No Known Allergies   Discharge Instructions    Diet - low sodium heart healthy   Complete by:  As directed     Increase activity slowly   Complete by:  As directed      Allergies as of 06/01/2018   No Known Allergies     Medication List    TAKE these medications   acyclovir 400 MG tablet Commonly known as:  ZOVIRAX Take 1 tablet (400 mg total) by mouth 2 (two) times daily as needed. What changed:  reasons to take this   ALPRAZolam 1 MG tablet Commonly known as:  XANAX Take 1 tablet (1 mg total) by mouth at bedtime as needed for anxiety or sleep.   dexamethasone 4 MG tablet Commonly known as:  DECADRON Take #1 tab TID for 5 days, 4mg  BID x 5 days, then 4mg  QD What changed:  additional instructions   diphenoxylate-atropine 2.5-0.025 MG tablet Commonly known as:  LOMOTIL Take 2 tablets by mouth 4 (four) times daily as needed for diarrhea or loose stools.   HYDROcodone-acetaminophen 10-325 MG tablet Commonly known as:  NORCO Take 1 tablet by mouth every 6 (six) hours as needed. What changed:  additional instructions   HYDROmorphone 4 MG tablet Commonly known as:  DILAUDID Take 1-2 tablets (4-8 mg total) by mouth every 4 (four) hours as needed for severe pain. What changed:  additional instructions   lidocaine-prilocaine cream Commonly known as:  EMLA Apply to port site 1 hour prior to use What changed:    how much to take  how to take this  when to take this  reasons to take this   LORazepam 0.5 MG tablet Commonly known as:  ATIVAN Place 1 tablet (0.5 mg total) under the tongue every 8 (eight) hours as needed (Nausea). Do  not take within 2 hours of Xanax dose.   MINIVELLE 0.075 MG/24HR Generic drug:  estradiol Place 1 patch onto the skin 2 (two) times a week. On Sundays & Wednesdays   ondansetron 8 MG tablet Commonly known as:  ZOFRAN Take 1 tablet (8 mg total) by mouth every 8 (eight) hours as needed for nausea or vomiting. Start day 3 after chemo.   oxyCODONE 10 mg 12 hr tablet Commonly known as:  OXYCONTIN Take 1 tablet (10 mg total) by mouth every 12  (twelve) hours.   pantoprazole 20 MG tablet Commonly known as:  PROTONIX TAKE 1 TABLET DAILY   prochlorperazine 10 MG tablet Commonly known as:  COMPAZINE Take 1 tablet (10 mg total) by mouth every 6 (six) hours as needed for nausea or vomiting.   progesterone 100 MG capsule Commonly known as:  PROMETRIUM Take 100 mg by mouth at bedtime.        Signed: [name], MD 06/01/2018, 11:33 AM

## 2018-06-03 ENCOUNTER — Other Ambulatory Visit: Payer: Self-pay | Admitting: Oncology

## 2018-06-04 ENCOUNTER — Other Ambulatory Visit: Payer: 59

## 2018-06-04 NOTE — Progress Notes (Signed)
  Radiation Oncology         [phone]) [phone] ________________________________  Name: [name] MRN: [id number]  Date: 05/30/2018  DOB: 12-Oct-1960  SRS End of Treatment Note  Diagnosis:   57 y.o. 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   There were no signs of acute toxicity after treatment.  Plan: The patient has completed radiation treatment. The patient will return to radiation oncology clinic for routine followup in one month. I advised the patient to call or return sooner if they have any questions or concerns related to their recovery or treatment. ________________________________  Jodelle Gross, MD, PhD  This document serves as a record of services personally performed by [name], MD. It was created on his behalf by Rae Lips, a trained medical scribe. The creation of this record is based on the scribe's personal observations and the provider's statements to them. This document has been checked and approved by the attending provider.

## 2018-06-06 ENCOUNTER — Other Ambulatory Visit: Payer: Self-pay

## 2018-06-06 DIAGNOSIS — C20 Malignant neoplasm of rectum: Secondary | ICD-10-CM

## 2018-06-07 ENCOUNTER — Encounter: Payer: Self-pay | Admitting: Nurse Practitioner

## 2018-06-07 ENCOUNTER — Inpatient Hospital Stay: Payer: 59

## 2018-06-07 ENCOUNTER — Inpatient Hospital Stay (HOSPITAL_BASED_OUTPATIENT_CLINIC_OR_DEPARTMENT_OTHER): Payer: 59 | Admitting: Nurse Practitioner

## 2018-06-07 VITALS — BP 130/78 | HR 87 | Temp 98.3°F | Resp 18 | Ht 64.0 in | Wt 163.3 lb

## 2018-06-07 DIAGNOSIS — C20 Malignant neoplasm of rectum: Secondary | ICD-10-CM

## 2018-06-07 DIAGNOSIS — C7931 Secondary malignant neoplasm of brain: Secondary | ICD-10-CM

## 2018-06-07 DIAGNOSIS — C778 Secondary and unspecified malignant neoplasm of lymph nodes of multiple regions: Secondary | ICD-10-CM

## 2018-06-07 DIAGNOSIS — R319 Hematuria, unspecified: Principal | ICD-10-CM

## 2018-06-07 DIAGNOSIS — Z95828 Presence of other vascular implants and grafts: Secondary | ICD-10-CM

## 2018-06-07 DIAGNOSIS — N390 Urinary tract infection, site not specified: Secondary | ICD-10-CM | POA: Diagnosis not present

## 2018-06-07 DIAGNOSIS — Z5111 Encounter for antineoplastic chemotherapy: Secondary | ICD-10-CM | POA: Diagnosis present

## 2018-06-07 DIAGNOSIS — Z923 Personal history of irradiation: Secondary | ICD-10-CM | POA: Diagnosis not present

## 2018-06-07 LAB — CBC WITH DIFFERENTIAL (CANCER CENTER ONLY)
Abs Immature Granulocytes: 0.1 10*3/uL — ABNORMAL HIGH (ref 0.00–0.07)
Basophils Absolute: 0 10*3/uL (ref 0.0–0.1)
Basophils Relative: 0 %
Eosinophils Absolute: 0 10*3/uL (ref 0.0–0.5)
Eosinophils Relative: 0 %
HEMATOCRIT: 33.1 % — AB (ref 36.0–46.0)
HEMOGLOBIN: 10.7 g/dL — AB (ref 12.0–15.0)
Immature Granulocytes: 1 %
LYMPHS ABS: 0.4 10*3/uL — AB (ref 0.7–4.0)
Lymphocytes Relative: 3 %
MCH: 30.5 pg (ref 26.0–34.0)
MCHC: 32.3 g/dL (ref 30.0–36.0)
MCV: 94.3 fL (ref 80.0–100.0)
Monocytes Absolute: 0.7 10*3/uL (ref 0.1–1.0)
Monocytes Relative: 6 %
Neutro Abs: 12.2 10*3/uL — ABNORMAL HIGH (ref 1.7–7.7)
Neutrophils Relative %: 90 %
Platelet Count: 154 10*3/uL (ref 150–400)
RBC: 3.51 MIL/uL — ABNORMAL LOW (ref 3.87–5.11)
RDW: 14.2 % (ref 11.5–15.5)
WBC Count: 13.5 10*3/uL — ABNORMAL HIGH (ref 4.0–10.5)
nRBC: 0 % (ref 0.0–0.2)

## 2018-06-07 LAB — CMP (CANCER CENTER ONLY)
ALT: 21 U/L (ref 0–44)
AST: 22 U/L (ref 15–41)
Albumin: 3.1 g/dL — ABNORMAL LOW (ref 3.5–5.0)
Alkaline Phosphatase: 73 U/L (ref 38–126)
Anion gap: 11 (ref 5–15)
BUN: 35 mg/dL — ABNORMAL HIGH (ref 6–20)
CO2: 24 mmol/L (ref 22–32)
Calcium: 8.6 mg/dL — ABNORMAL LOW (ref 8.9–10.3)
Chloride: 105 mmol/L (ref 98–111)
Creatinine: 1.07 mg/dL — ABNORMAL HIGH (ref 0.44–1.00)
GFR, Est AFR Am: >60 mL/min (ref >60)
GFR, Estimated: 58 mL/min — ABNORMAL LOW (ref >60)
Glucose, Bld: 136 mg/dL — ABNORMAL HIGH (ref 70–99)
Potassium: 3.6 mmol/L (ref 3.5–5.1)
Sodium: 140 mmol/L (ref 135–145)
Total Bilirubin: 0.3 mg/dL (ref 0.3–1.2)
Total Protein: 6.2 g/dL — ABNORMAL LOW (ref 6.5–8.1)

## 2018-06-07 LAB — CEA (IN HOUSE-CHCC): CEA (CHCC-In House): 9.13 ng/mL — ABNORMAL HIGH (ref 0.00–5.00)

## 2018-06-07 MED ORDER — FLUOROURACIL CHEMO INJECTION 2.5 GM/50ML
400.0000 mg/m2 | Freq: Once | INTRAVENOUS | Status: AC
  Administered 2018-06-07: 700 mg via INTRAVENOUS
  Filled 2018-06-07: qty 14

## 2018-06-07 MED ORDER — PALONOSETRON HCL INJECTION 0.25 MG/5ML
0.2500 mg | Freq: Once | INTRAVENOUS | Status: AC
  Administered 2018-06-07: 0.25 mg via INTRAVENOUS

## 2018-06-07 MED ORDER — DEXTROSE 5 % IV SOLN
Freq: Once | INTRAVENOUS | Status: AC
  Administered 2018-06-07: 11:00:00 via INTRAVENOUS
  Filled 2018-06-07: qty 250

## 2018-06-07 MED ORDER — LEUCOVORIN CALCIUM INJECTION 350 MG
400.0000 mg/m2 | Freq: Once | INTRAVENOUS | Status: AC
  Administered 2018-06-07: 720 mg via INTRAVENOUS
  Filled 2018-06-07: qty 36

## 2018-06-07 MED ORDER — SODIUM CHLORIDE 0.9% FLUSH
10.0000 mL | INTRAVENOUS | Status: DC | PRN
  Administered 2018-06-07: 10 mL
  Filled 2018-06-07: qty 10

## 2018-06-07 MED ORDER — OXALIPLATIN CHEMO INJECTION 100 MG/20ML
84.0000 mg/m2 | Freq: Once | INTRAVENOUS | Status: AC
  Administered 2018-06-07: 150 mg via INTRAVENOUS
  Filled 2018-06-07: qty 20

## 2018-06-07 MED ORDER — PALONOSETRON HCL INJECTION 0.25 MG/5ML
INTRAVENOUS | Status: AC
  Filled 2018-06-07: qty 5

## 2018-06-07 MED ORDER — SODIUM CHLORIDE 0.9 % IV SOLN
2400.0000 mg/m2 | INTRAVENOUS | Status: DC
  Administered 2018-06-07: 4300 mg via INTRAVENOUS
  Filled 2018-06-07: qty 86

## 2018-06-07 NOTE — Patient Instructions (Signed)
[facility] Discharge Instructions for Patients Receiving Chemotherapy  Today you received the following chemotherapy agents: Oxaliplatin, Leucovorin, 5-FU.  To help prevent nausea and vomiting after your treatment, we encourage you to take your nausea medication as prescribed by your physician.    If you develop nausea and vomiting that is not controlled by your nausea medication, call the clinic.   BELOW ARE SYMPTOMS THAT SHOULD BE REPORTED IMMEDIATELY:  *FEVER GREATER THAN 100.5 F  *CHILLS WITH OR WITHOUT FEVER  NAUSEA AND VOMITING THAT IS NOT CONTROLLED WITH YOUR NAUSEA MEDICATION  *UNUSUAL SHORTNESS OF BREATH  *UNUSUAL BRUISING OR BLEEDING  TENDERNESS IN MOUTH AND THROAT WITH OR WITHOUT PRESENCE OF ULCERS  *URINARY PROBLEMS  *BOWEL PROBLEMS  UNUSUAL RASH Items with * indicate a potential emergency and should be followed up as soon as possible.  Feel free to call the clinic should you have any questions or concerns. The clinic phone number is [phone].  Please show the [place] at check-in to the Emergency Department and triage nurse.

## 2018-06-07 NOTE — Progress Notes (Addendum)
[facility] OFFICE PROGRESS NOTE   Diagnosis: Rectal cancer  INTERVAL HISTORY:   [name] returns as scheduled.  She completed cycle 2 FOLFOX 05/10/2018.  She underwent SRS to a left parietal mass 05/31/2018.  She overall is feeling well.  She reports that she is "wired" due to the steroids.  She is currently on a tapering dose of the dexamethasone and will begin twice daily today.  She denies nausea/vomiting.  No mouth sores.  No diarrhea.  No leg pain or weakness.  She is having difficulty sleeping.  No bleeding.  Objective:  Vital signs in last 24 hours:  Blood pressure 130/78, pulse 87, temperature 98.3 F (36.8 C), temperature source Oral, resp. rate 18, height 5' 4"  (1.626 m), weight 163 lb 4.8 oz (74.1 kg), SpO2 100 %.    HEENT: No thrush or ulcers. Lymphatics: Persistent firm left low medial neck mass. Resp: Lungs clear bilaterally. Cardio: Regular rate and rhythm. GI: Abdomen soft and nontender.  No hepatomegaly. Vascular: Edema throughout the right leg. Neuro: Alert and oriented.  Follows commands. Port-A-Cath without erythema.  Lab Results:  Lab Results  Component Value Date   WBC 13.5 (H) 06/07/2018   HGB 10.7 (L) 06/07/2018   HCT 33.1 (L) 06/07/2018   MCV 94.3 06/07/2018   PLT 154 06/07/2018   NEUTROABS 12.2 (H) 06/07/2018    Imaging:  No results found.  Medications: I have reviewed the patient's current medications.  Assessment/Plan: 1. Rectal cancer, clinical stage III (T3 N1) status post biopsy of a rectal mass on 05/24/2013 confirming adenocarcinoma. Endoscopic ultrasound 05/31/2013 measured the mass at 11 cm from the anal verge, uT3uN1.  Initiation of radiation and concurrent Xeloda 06/18/2013; completion 07/26/2013.   Status post low anterior resection 09/02/2013. Invasive adenocarcinoma, 3.7 cm, negative margins; metastatic carcinoma in 7 of 11 lymph nodes; extensive lymph vascular involvement by tumor.MSI-stable, no loss  of mismatch repair protein expression  Cycle 1 adjuvant FOLFOX 10/15/2013.   Cycle 2 adjuvant FOLFOX 10/29/2013.   Cycle 3 adjuvant FOLFOX 11/14/2013.   Cycle 4 adjuvant FOLFOX 11/27/2013.   Cycle 5 adjuvant FOLFOX 12/10/2013   Cycle 6 adjuvant FOLFOX 12/24/2013.   Cycle 7 adjuvant FOLFOX 01/14/2014.   Cycle 8 adjuvant FOLFOX 01/28/2014   Cycle 9 adjuvant FOLFOX 02/11/2014 (oxaliplatin dose reduced secondary to thrombocytopenia)   Surveillance CT scans 06/02/2014 with no evidence of recurrent rectal cancer  surveillance CT scans 06/16/2015 with no evidence of recurrent rectal cancer, 10 mm right external iliac node , increased amount of loculated air associated with the presacral soft tissue thickening , new left hydronephrosis  Surveillance CT scans 05/06/2016-negative for recurrent rectal cancer, decreased right external iliac node, persistent left hydronephrosis  CT abdomen/pelvis 07/05/2017-new left para-aortic, aortocaval, right common iliac and right external iliac lymphadenopathy.  CT neck/chest 07/13/2017-enlarged left supraclavicular nodes, new paratracheal nodes  Ultrasound-guided biopsy of a left supraclavicular lymph node 07/25/2017-metastatic adenocarcinoma consistent with colorectal cancer,MSI-stable, tumor mutation burden-10, K-ras S38mtation  Cycle 1 FOLFIRI 08/28/2017  Cycle 2 FOLFIRI 09/11/2017  Cycle 3 FOLFIRI 09/25/2017  Cycle 4 FOLFIRI 10/09/2017  Cycle 5 FOLFIRI 10/23/2017  CTs 11/14/2017-slight decrease in left supraclavicular adenopathy, slight increase and decrease in mediastinal/retroperitoneal/pelvic nodes, no new disease  Cycle 6 FOLFIRI 11/14/2017  Cycle 7 FOLFIRI 11/27/2017  Cycle 8 FOLFIRI 12/11/2017  Cycle 9 FOLFIRI 01/03/2018  Cycle 10 FOLFIRI 01/18/2018  Restaging CTs 01/29/2018-stable thoracic adenopathywith mildly improved abdominal adenopathy. Similar appearance of presacral soft tissue density. Suspected small fistula from  the  posterior margin of the rectum or anastomosis to the presacral region.  Cycle 11 FOLFIRI 01/31/2018  Cycle 12 FOLFIRI 02/20/2018  CT pelvis 03/16/2018-mild progression of pelvic lymphadenopathy with compression of the right iliac vein by an enlarged right obturator node  Cycle 1 FOLFOX 04/23/2018  CT emergency department 04/24/2018-slight interval increase in retroperitoneal adenopathy and right pelvic sidewall mass.  Cycle 2 FOLFOX 05/10/2018  Brain CT 05/10/2018- left parietal metastasis with extensive vasogenic edema  Brain MRI 05/22/2018- 3 cm solitary left parietal metastasis with extensive vasogenic edema  SRS left parietal metastasis 05/31/2018-metastatic adenocarcinoma consistent with primary rectal adenocarcinoma  Cycle 3 FOLFOX 06/07/2018 2. Post colonoscopy bowel perforation confirmed on abdomen CT 05/28/2013. 3. Single indeterminate liver lesion on the abdomen CT 05/28/2013. MRI on 06/18/2013 confirmed 2 tiny liver lesions, indeterminate-favored to be benign cysts. 4. History of cervical and lumbar disc disease.  5. History of HELLP syndrome with first pregnancy.  6. Delayed nausea following cycle 1 FOLFOX. She receives Aloxi and Emend. She continues to experience delayed nausea. Prophylactic Decadron added with cycle 4. 7. History of pain with bowel movements. 8. Anastomotic leak on CT 09/13/2013. Status post exploratory laparotomy with drainage of a pelvic abscess, diverting loop ileostomy. Drainage catheter removed 10/23/2013.  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 Perineal numbness, urinary retention. The urinary retention has improved. 10. Perineal pain-improved. 11. Neutropenia secondary chemotherapy-she received Neulasta. 12. Urinary tract infection, Escherichia coli-11/03/2013. 13. Venous engorgement at the left anterior chest and left arm with  swelling of the left arm and hand. Venous Doppler 11/14/2013 positive for DVT. She began Lovenox 11/14/2013 and was switched to xarelto 12/10/2013. Xarelto discontinued early January 2016. 14. Fever 11/20/2013. Blood and urine cultures negative. The fever resolved within 24 hours. She continued to have a single episode of fever following chemotherapy 15. History of a herpetic lip lesion 16. 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 17. Escherichia coli bacteremia, urinary source, 01/14/2014. She completed a 10 day course of ceftriaxone. 77. 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 Anxiety/depression. 20. Port-A-Cath removal 06/09/2014. 21. Escherichia coli urinary tract infection 06/24/2014. She completed a course of ciprofloxacin. 22. frequent bowel movements following surgery/radiation -status post placement of a sacral stimulator 11/21/2014 23. CT 06/16/2015 with new left hydronephrosis status post cystoscopy with placement of a double-J stent 07/17/2015. No tumors or stones were seen in the bladder. Both ureteral orifices were unremarkable. On the left side retrograde study was performed. The distal ureter was narrowed and then a little bit more dilated proximally with hydronephrosis noted. There was no evidence of intramural tumor but the area did seem a bit strictured.  Stent removed, persistent hydronephrosis, followed by Dr. Amalia Hailey  CT 05/06/2016-increased left hydronephrosis  Ultrasound 09/09/2016-stable left hydroureteronephrosis  Left nephrectomy 04/28/2017  24.Compression of right iliac vein on CT 03/16/2018-placed on Xarelto anticoagulation  Disposition: [name] appears stable.  She has completed 2 cycles of FOLFOX.  Treatment was subsequently placed on hold due to finding of an isolated brain metastasis.  She underwent [facility] 05/31/2018.  The plan is to resume systemic therapy with cycle 3 FOLFOX today.  She agrees with this  plan.  We reviewed the CBC from today.  Counts adequate for treatment.  She is experiencing insomnia/agitation most likely due to the dexamethasone.  She will contact radiation oncology or Dr. Vertell Limber regarding the taper schedule.  She will return for lab, follow-up and cycle  4 FOLFOX in 2 weeks.  She will contact the office in the interim with any problems.  Patient seen with Dr. Benay Spice.    Ned Card ANP/GNP-BC   06/07/2018  10:17 AM  This was a shared visit with Ned Card.  Ms. Mcnelly is interviewed and examined.  She has recovered from the brain SRS and resection of seizure.  She has mania secondary to steroids.  Her husband will contact Dr. Vertell Limber to see if the Decadron can be tapered more quickly.  She will complete another cycle of FOLFOX today.  We discussed the prognosis and treatment plan with her husband.   [name], MD

## 2018-06-07 NOTE — Progress Notes (Signed)
Brain and Spine Tumor Board Documentation  [name] was presented by Cecil Cobbs, MD at Brain and Spine Tumor Board on 06/07/2018, which included representatives from neuro oncology, surgical oncology, radiation oncology, pathology, navigation, radiology.  [name] was presented as a current patient with history of the following treatments:  .  Additionally, we reviewed previous medical and familial history, history of present illness, and recent lab results along with all available histopathologic and imaging studies. The tumor board considered available treatment options and made the following recommendations:  Active surveillance    Tumor board is a meeting of clinicians from various specialty areas who evaluate and discuss patients for whom a multidisciplinary approach is being considered. Final determinations in the plan of care are those of the provider(s). The responsibility for follow up of recommendations given during tumor board is that of the provider.   Today's extended care, comprehensive team conference, [name] was not present for the discussion and was not examined.

## 2018-06-08 ENCOUNTER — Telehealth: Payer: Self-pay

## 2018-06-08 NOTE — Telephone Encounter (Signed)
TC to patient per Dr. Benay Spice. Dr Vertell Limber recommendation is to take Decadron 4 mg daily x2 days then stop. Spoke with patients husband who verbalized understanding.

## 2018-06-08 NOTE — Progress Notes (Signed)
  Radiation Oncology         [phone] ________________________________  Name: [name] MRN: [id number]  Date: 05/30/2018  DOB: [birth year]   SPECIAL TREATMENT PROCEDURE   3D TREATMENT PLANNING AND DOSIMETRY: [name] radiation plan was reviewed and approved by Dr. Vertell Limber from neurosurgery and radiation oncology prior to treatment. It showed 3-dimensional radiation distributions overlaid onto the planning CT/MRI image set. The [facility] for the target structures as well as the organs at risk were reviewed. The documentation of the 3D plan and dosimetry are filed in the radiation oncology EMR.   NARRATIVE: The patient was brought to the TrueBeam stereotactic radiation treatment machine and placed supine on the CT couch. The head frame was applied, and the patient was set up for stereotactic radiosurgery. Neurosurgery was present for the set-up and delivery   SIMULATION VERIFICATION: In the couch zero-angle position, the patient underwent Exactrac imaging using the Brainlab system with orthogonal KV images. These were carefully aligned and repeated to confirm treatment position for each of the isocenters. The Exactrac snap film verification was repeated at each couch angle.   SPECIAL TREATMENT PROCEDURE: The patient received stereotactic radiosurgery to the following target:  PTV1 target was treated using 4 Arcs to a prescription dose of 14 Gy. ExacTrac Snap verification was performed for each couch angle.   STEREOTACTIC TREATMENT MANAGEMENT: Following delivery, the patient was transported to nursing in stable condition and monitored for possible acute effects. Vital signs were recorded . The patient tolerated treatment without significant acute effects, and was discharged to home in stable condition.  PLAN: Follow-up in one month.   ------------------------------------------------  Jodelle Gross, MD, PhD

## 2018-06-09 ENCOUNTER — Inpatient Hospital Stay: Payer: 59

## 2018-06-09 VITALS — BP 117/72 | HR 109 | Temp 97.9°F | Resp 18 | Ht 64.0 in

## 2018-06-09 DIAGNOSIS — Z5111 Encounter for antineoplastic chemotherapy: Secondary | ICD-10-CM | POA: Diagnosis not present

## 2018-06-09 MED ORDER — SODIUM CHLORIDE 0.9% FLUSH
10.0000 mL | INTRAVENOUS | Status: DC | PRN
  Administered 2018-06-09: 10 mL
  Filled 2018-06-09: qty 10

## 2018-06-09 MED ORDER — HEPARIN SOD (PORK) LOCK FLUSH 100 UNIT/ML IV SOLN
500.0000 [IU] | Freq: Once | INTRAVENOUS | Status: AC | PRN
  Administered 2018-06-09: 500 [IU]
  Filled 2018-06-09: qty 5

## 2018-06-09 MED ORDER — SODIUM CHLORIDE 0.9 % IV SOLN
INTRAVENOUS | Status: AC
  Administered 2018-06-09: 12:00:00 via INTRAVENOUS
  Filled 2018-06-09 (×2): qty 250

## 2018-06-09 NOTE — Patient Instructions (Signed)

## 2018-06-11 ENCOUNTER — Other Ambulatory Visit: Payer: Self-pay

## 2018-06-12 ENCOUNTER — Telehealth: Payer: Self-pay | Admitting: Oncology

## 2018-06-12 ENCOUNTER — Other Ambulatory Visit: Payer: Self-pay | Admitting: *Deleted

## 2018-06-12 DIAGNOSIS — C20 Malignant neoplasm of rectum: Secondary | ICD-10-CM

## 2018-06-12 MED ORDER — ACYCLOVIR 400 MG PO TABS: 400.0000 mg | ORAL_TABLET | Freq: Two times a day (BID) | ORAL | 1 refills | Status: AC | PRN

## 2018-06-12 MED ORDER — RIVAROXABAN 20 MG PO TABS: 20.0000 mg | ORAL_TABLET | Freq: Every day | ORAL | 0 refills | Status: DC

## 2018-06-12 NOTE — Telephone Encounter (Signed)
Scheduled appt per 12/19 los - pt aware of appts decoupled on 1/3 and 1/4

## 2018-06-17 ENCOUNTER — Other Ambulatory Visit: Payer: Self-pay | Admitting: Oncology

## 2018-06-18 ENCOUNTER — Telehealth: Payer: Self-pay | Admitting: *Deleted

## 2018-06-18 DIAGNOSIS — C20 Malignant neoplasm of rectum: Secondary | ICD-10-CM

## 2018-06-18 MED ORDER — ALPRAZOLAM 1 MG PO TABS: 1.0000 mg | ORAL_TABLET | Freq: Every evening | ORAL | 0 refills | Status: DC | PRN

## 2018-06-18 NOTE — Telephone Encounter (Signed)
Per Dr. Benay Spice: Needs doppler of RLE to r/o DVT. Patient understands and agrees. Seeing Dr. Vertell Limber (NS) at 2:45 pm today. Will attempt to schedule for tomorrow. Doppler on 12/31 at 0930/1000 at [facility]  She also requests refill on her Xanax. Taking at bedtime, but admits she took it twice daily for several days when the decadron was making her anxious.

## 2018-06-18 NOTE — Telephone Encounter (Signed)
"  Jeweliana Dudgeon Beavers [phone]).  Rectal cancer to lymph nodes blocking right leg for several months  have now caused swelling twice the size.  Right leg turns out when I walk.  I've tried ice, elevating leg, magnesium bath, soaking right foot and more with no relief.  Is it now so blocked it won't go down?  Do I need surgery or do we wait for FOLFOX to take effect?  No change in color, feeling or sensation of right leg.  No leg pain.  Weaned off dexamethasone last week."    Next scheduled lab, F/U, chemotherapy 06-22-2018.

## 2018-06-19 ENCOUNTER — Telehealth: Payer: Self-pay | Admitting: *Deleted

## 2018-06-19 ENCOUNTER — Ambulatory Visit (HOSPITAL_COMMUNITY)
Admission: RE | Admit: 2018-06-19 | Discharge: 2018-06-19 | Disposition: A | Discharge status: Home / Self Care | Payer: 59 | Source: Ambulatory Visit | Attending: Oncology | Admitting: Oncology

## 2018-06-19 DIAGNOSIS — C20 Malignant neoplasm of rectum: Secondary | ICD-10-CM | POA: Diagnosis not present

## 2018-06-19 NOTE — Progress Notes (Signed)
Right lower extremity venous duplex completed - Preliminary results - There is no evidence of DVT or Baker's cyst. Results called to cancer center triage -No answer in the office. [facility], [place] 06/19/2018, 10:53 AM

## 2018-06-19 NOTE — Telephone Encounter (Signed)
Call report per "[place].  Randolm Idol Wind S/P right lower extremity doppler study result is negative.  Sending patient home.  If provider has any instructions he may call patient at home."

## 2018-06-19 NOTE — Telephone Encounter (Signed)
Confirmed w/patient that she was negative for blood clot. MD will see her on Friday and examine her and decide how to manage her swelling.

## 2018-06-21 ENCOUNTER — Other Ambulatory Visit: Payer: Self-pay | Admitting: *Deleted

## 2018-06-21 DIAGNOSIS — C20 Malignant neoplasm of rectum: Secondary | ICD-10-CM

## 2018-06-22 ENCOUNTER — Telehealth: Payer: Self-pay | Admitting: Oncology

## 2018-06-22 ENCOUNTER — Inpatient Hospital Stay: Payer: Medicare Other

## 2018-06-22 ENCOUNTER — Inpatient Hospital Stay: Payer: Medicare Other | Attending: Nurse Practitioner | Admitting: Oncology

## 2018-06-22 VITALS — BP 120/98 | HR 94 | Temp 98.9°F | Resp 17 | Ht 64.0 in | Wt 164.0 lb

## 2018-06-22 DIAGNOSIS — Z452 Encounter for adjustment and management of vascular access device: Secondary | ICD-10-CM | POA: Insufficient documentation

## 2018-06-22 DIAGNOSIS — N390 Urinary tract infection, site not specified: Secondary | ICD-10-CM

## 2018-06-22 DIAGNOSIS — C20 Malignant neoplasm of rectum: Secondary | ICD-10-CM | POA: Diagnosis not present

## 2018-06-22 DIAGNOSIS — Z5189 Encounter for other specified aftercare: Secondary | ICD-10-CM | POA: Insufficient documentation

## 2018-06-22 DIAGNOSIS — R300 Dysuria: Secondary | ICD-10-CM

## 2018-06-22 DIAGNOSIS — Z5111 Encounter for antineoplastic chemotherapy: Secondary | ICD-10-CM | POA: Diagnosis not present

## 2018-06-22 DIAGNOSIS — C7931 Secondary malignant neoplasm of brain: Secondary | ICD-10-CM | POA: Diagnosis not present

## 2018-06-22 DIAGNOSIS — R319 Hematuria, unspecified: Secondary | ICD-10-CM

## 2018-06-22 DIAGNOSIS — C770 Secondary and unspecified malignant neoplasm of lymph nodes of head, face and neck: Secondary | ICD-10-CM | POA: Insufficient documentation

## 2018-06-22 DIAGNOSIS — Z95828 Presence of other vascular implants and grafts: Secondary | ICD-10-CM

## 2018-06-22 DIAGNOSIS — R509 Fever, unspecified: Secondary | ICD-10-CM | POA: Insufficient documentation

## 2018-06-22 LAB — CMP (CANCER CENTER ONLY)
ALT: 22 U/L (ref 0–44)
AST: 18 U/L (ref 15–41)
Albumin: 2.7 g/dL — ABNORMAL LOW (ref 3.5–5.0)
Alkaline Phosphatase: 58 U/L (ref 38–126)
Anion gap: 7 (ref 5–15)
BUN: 11 mg/dL (ref 6–20)
CO2: 24 mmol/L (ref 22–32)
Calcium: 8.8 mg/dL — ABNORMAL LOW (ref 8.9–10.3)
Chloride: 104 mmol/L (ref 98–111)
Creatinine: 1.1 mg/dL — ABNORMAL HIGH (ref 0.44–1.00)
GFR, Estimated: 56 mL/min — ABNORMAL LOW (ref >60)
Glucose, Bld: 106 mg/dL — ABNORMAL HIGH (ref 70–99)
Potassium: 4.1 mmol/L (ref 3.5–5.1)
Sodium: 135 mmol/L (ref 135–145)
TOTAL PROTEIN: 6.5 g/dL (ref 6.5–8.1)
Total Bilirubin: 0.5 mg/dL (ref 0.3–1.2)

## 2018-06-22 LAB — URINALYSIS, COMPLETE (UACMP) WITH MICROSCOPIC
Bilirubin Urine: NEGATIVE
Glucose, UA: NEGATIVE mg/dL
Ketones, ur: NEGATIVE mg/dL
Nitrite: NEGATIVE
Protein, ur: NEGATIVE mg/dL
Specific Gravity, Urine: 1.005 (ref 1.005–1.030)
pH: 6 (ref 5.0–8.0)

## 2018-06-22 LAB — CBC WITH DIFFERENTIAL (CANCER CENTER ONLY)
Abs Immature Granulocytes: 0.01 10*3/uL (ref 0.00–0.07)
BASOS ABS: 0 10*3/uL (ref 0.0–0.1)
Basophils Relative: 1 %
Eosinophils Absolute: 0.1 10*3/uL (ref 0.0–0.5)
Eosinophils Relative: 5 %
HCT: 26.7 % — ABNORMAL LOW (ref 36.0–46.0)
Hemoglobin: 8.8 g/dL — ABNORMAL LOW (ref 12.0–15.0)
Immature Granulocytes: 1 %
Lymphocytes Relative: 24 %
Lymphs Abs: 0.5 10*3/uL — ABNORMAL LOW (ref 0.7–4.0)
MCH: 30.1 pg (ref 26.0–34.0)
MCHC: 33 g/dL (ref 30.0–36.0)
MCV: 91.4 fL (ref 80.0–100.0)
Monocytes Absolute: 0.6 10*3/uL (ref 0.1–1.0)
Monocytes Relative: 31 %
NRBC: 0 % (ref 0.0–0.2)
Neutro Abs: 0.8 10*3/uL — ABNORMAL LOW (ref 1.7–7.7)
Neutrophils Relative %: 38 %
Platelet Count: 206 10*3/uL (ref 150–400)
RBC: 2.92 MIL/uL — ABNORMAL LOW (ref 3.87–5.11)
RDW: 14.5 % (ref 11.5–15.5)
WBC Count: 2 10*3/uL — ABNORMAL LOW (ref 4.0–10.5)

## 2018-06-22 MED ORDER — SODIUM CHLORIDE 0.9% FLUSH
10.0000 mL | INTRAVENOUS | Status: DC | PRN
  Administered 2018-06-22: 10 mL
  Filled 2018-06-22: qty 10

## 2018-06-22 MED ORDER — NITROFURANTOIN MONOHYD MACRO 100 MG PO CAPS: 100.0000 mg | ORAL_CAPSULE | Freq: Two times a day (BID) | ORAL | 0 refills | Status: DC

## 2018-06-22 MED ORDER — HEPARIN SOD (PORK) LOCK FLUSH 100 UNIT/ML IV SOLN
500.0000 [IU] | Freq: Once | INTRAVENOUS | Status: AC | PRN
  Administered 2018-06-22: 500 [IU]
  Filled 2018-06-22: qty 5

## 2018-06-22 MED ORDER — OXYCODONE HCL ER 10 MG PO T12A: 10.0000 mg | EXTENDED_RELEASE_TABLET | Freq: Two times a day (BID) | ORAL | 0 refills | Status: DC

## 2018-06-22 NOTE — Telephone Encounter (Signed)
Patient's 1/3 appointments remain as scheduled. Was not able to reach patient yesterday re adjusting time.

## 2018-06-22 NOTE — Progress Notes (Signed)
[facility] OFFICE PROGRESS NOTE   Diagnosis: Rectal cancer  INTERVAL HISTORY:   [name] returns as scheduled.  She completed another cycle of FOLFOX 06/07/2018.  No nausea following this cycle of chemotherapy.  She has noted increased peripheral numbness.  This does not interfere with activity.  She had soreness in the mouth following chemotherapy. She has noted increased headaches since discontinuing Decadron.  The mania has resolved.  She is using OxyContin and hydrocodone for the headache. She has increased edema throughout the right leg. She had a temperature of 103 degrees last night and symptoms of urinary tract infection.  She took Otho and feels better today.  She had chills 2 days prior to developing a fever.  Objective:  Vital signs in last 24 hours:  Blood pressure (!) 120/98, pulse 94, temperature 98.9 F (37.2 C), temperature source Oral, resp. rate 17, height 5' 4" (1.626 m), weight 164 lb (74.4 kg), SpO2 98 %.    HEENT: Mild white coat over the tongue, no buccal thrush, no ulcers Lymphatics: Firm approximate 3 cm nodal mass in the medial left supraclavicular fossa Resp: Lungs clear bilaterally Cardio: Regular rate and rhythm GI: No hepatomegaly, no mass Vascular: Edema throughout the right leg, no erythema Neuro: Mild loss of vibratory sense of the fingertips bilaterally Skin: Several areas of paronychia and abrasions at the hands  Portacath/PICC-without erythema  Lab Results:  Lab Results  Component Value Date   WBC 2.0 (L) 06/22/2018   HGB 8.8 (L) 06/22/2018   HCT 26.7 (L) 06/22/2018   MCV 91.4 06/22/2018   PLT 206 06/22/2018   NEUTROABS 0.8 (L) 06/22/2018    CMP  Lab Results  Component Value Date   NA 135 06/22/2018   K 4.1 06/22/2018   CL 104 06/22/2018   CO2 24 06/22/2018   GLUCOSE 106 (H) 06/22/2018   BUN 11 06/22/2018   CREATININE 1.10 (H) 06/22/2018   CALCIUM 8.8 (L) 06/22/2018   PROT 6.5 06/22/2018   ALBUMIN  2.7 (L) 06/22/2018   AST 18 06/22/2018   ALT 22 06/22/2018   ALKPHOS 58 06/22/2018   BILITOT 0.5 06/22/2018   GFRNONAA 56 (L) 06/22/2018   GFRAA >60 06/22/2018    Lab Results  Component Value Date   CEA1 9.13 (H) 06/07/2018      Imaging:  Vas [place] Lower Extremity Venous (dvt)  Result Date: 06/19/2018  Lower Venous Study Indications: Swelling.  Risk Factors: Cancer Metastatic rectal to the nodes, and brain tumor. Performing Technologist: Toma Copier RVS  Examination Guidelines: A complete evaluation includes B-mode imaging, spectral Doppler, color Doppler, and power Doppler as needed of all accessible portions of each vessel. Bilateral testing is considered an integral part of a complete examination. Limited examinations for reoccurring indications may be performed as noted.  Right Venous Findings: +---------+---------------+---------+-----------+----------+-------------------+          CompressibilityPhasicitySpontaneityPropertiesSummary             +---------+---------------+---------+-----------+----------+-------------------+ CFV      Full           Yes      Yes                                      +---------+---------------+---------+-----------+----------+-------------------+ SFJ      Full                                                             +---------+---------------+---------+-----------+----------+-------------------+  FV Prox  Full           Yes      Yes                                      +---------+---------------+---------+-----------+----------+-------------------+ FV Mid   Full                                                             +---------+---------------+---------+-----------+----------+-------------------+ FV DistalFull           Yes      Yes                                      +---------+---------------+---------+-----------+----------+-------------------+ PFV      Full           Yes      Yes                                       +---------+---------------+---------+-----------+----------+-------------------+ POP      Full           Yes      Yes                                      +---------+---------------+---------+-----------+----------+-------------------+ PTV      Full                                                             +---------+---------------+---------+-----------+----------+-------------------+ PERO                                                  difficult to                                                              visualize due to                                                          edema               +---------+---------------+---------+-----------+----------+-------------------+  Left Venous Findings: +---+---------------+---------+-----------+----------+-------+    CompressibilityPhasicitySpontaneityPropertiesSummary +---+---------------+---------+-----------+----------+-------+ CFVFull           Yes      Yes                          +---+---------------+---------+-----------+----------+-------+  SFJFull                                                 +---+---------------+---------+-----------+----------+-------+    Summary: Right: There is no evidence of deep vein thrombosis in the lower extremity. No cystic structure found in the popliteal fossa. Left: There is no evidence of a common femoral vein obstruction.  *See table(s) above for measurements and observations. Electronically signed by Servando Snare MD on 06/19/2018 at 5:07:28 PM.    Final     Medications: I have reviewed the patient's current medications.   Assessment/Plan: 1. Rectal cancer, clinical stage III (T3 N1) status post biopsy of a rectal mass on 05/24/2013 confirming adenocarcinoma. Endoscopic ultrasound 05/31/2013 measured the mass at 11 cm from the anal verge, uT3uN1.  Initiation of radiation and concurrent Xeloda 06/18/2013; completion 07/26/2013.    Status post low anterior resection 09/02/2013. Invasive adenocarcinoma, 3.7 cm, negative margins; metastatic carcinoma in 7 of 11 lymph nodes; extensive lymph vascular involvement by tumor.MSI-stable, no loss of mismatch repair protein expression  Cycle 1 adjuvant FOLFOX 10/15/2013.   Cycle 2 adjuvant FOLFOX 10/29/2013.   Cycle 3 adjuvant FOLFOX 11/14/2013.   Cycle 4 adjuvant FOLFOX 11/27/2013.   Cycle 5 adjuvant FOLFOX 12/10/2013   Cycle 6 adjuvant FOLFOX 12/24/2013.   Cycle 7 adjuvant FOLFOX 01/14/2014.   Cycle 8 adjuvant FOLFOX 01/28/2014   Cycle 9 adjuvant FOLFOX 02/11/2014 (oxaliplatin dose reduced secondary to thrombocytopenia)   Surveillance CT scans 06/02/2014 with no evidence of recurrent rectal cancer  surveillance CT scans 06/16/2015 with no evidence of recurrent rectal cancer, 10 mm right external iliac node , increased amount of loculated air associated with the presacral soft tissue thickening , new left hydronephrosis  Surveillance CT scans 05/06/2016-negative for recurrent rectal cancer, decreased right external iliac node, persistent left hydronephrosis  CT abdomen/pelvis 07/05/2017-new left para-aortic, aortocaval, right common iliac and right external iliac lymphadenopathy.  CT neck/chest 07/13/2017-enlarged left supraclavicular nodes, new paratracheal nodes  Ultrasound-guided biopsy of a left supraclavicular lymph node 07/25/2017-metastatic adenocarcinoma consistent with colorectal cancer,MSI-stable, tumor mutation burden-10, K-ras S82mtation  Cycle 1 FOLFIRI 08/28/2017  Cycle 2 FOLFIRI 09/11/2017  Cycle 3 FOLFIRI 09/25/2017  Cycle 4 FOLFIRI 10/09/2017  Cycle 5 FOLFIRI 10/23/2017  CTs 11/14/2017-slight decrease in left supraclavicular adenopathy, slight increase and decrease in mediastinal/retroperitoneal/pelvic nodes, no new disease  Cycle 6 FOLFIRI 11/14/2017  Cycle 7 FOLFIRI 11/27/2017  Cycle 8 FOLFIRI 12/11/2017  Cycle 9 FOLFIRI  01/03/2018  Cycle 10 FOLFIRI 01/18/2018  Restaging CTs 01/29/2018-stable thoracic adenopathywith mildly improved abdominal adenopathy. Similar appearance of presacral soft tissue density. Suspected small fistula from the posterior margin of the rectum or anastomosis to the presacral region.  Cycle 11 FOLFIRI 01/31/2018  Cycle 12 FOLFIRI 02/20/2018  CT pelvis 03/16/2018-mild progression of pelvic lymphadenopathy with compression of the right iliac vein by an enlarged right obturator node  Cycle 1 FOLFOX 04/23/2018  CT emergency department 04/24/2018-slight interval increase in retroperitoneal adenopathy and right pelvic sidewall mass.  Cycle 2 FOLFOX 05/10/2018  Brain CT 05/10/2018- left parietal metastasis with extensive vasogenic edema  Brain MRI 05/22/2018- 3 cm solitary left parietal metastasis with extensive vasogenic edema  SRS left parietal metastasis 05/30/2018, brain mass resection 05/31/2018-metastatic adenocarcinoma consistent with primary rectal adenocarcinoma  Cycle 3 FOLFOX 06/07/2018 2. Post colonoscopy bowel perforation confirmed on abdomen CT 05/28/2013. 3. Single indeterminate liver lesion  on the abdomen CT 05/28/2013. MRI on 06/18/2013 confirmed 2 tiny liver lesions, indeterminate-favored to be benign cysts. 4. History of cervical and lumbar disc disease.  5. History of HELLP syndrome with first pregnancy.  6. Delayed nausea following cycle 1 FOLFOX. She receives Aloxi and Emend. She continues to experience delayed nausea. Prophylactic Decadron added with cycle 4. 7. History of pain with bowel movements. 8. Anastomotic leak on CT 09/13/2013. Status post exploratory laparotomy with drainage of a pelvic abscess, diverting loop ileostomy. Drainage catheter removed 10/23/2013.  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 Perineal numbness, urinary retention. The  urinary retention has improved. 10. Perineal pain-improved. 11. Neutropenia secondary chemotherapy-she received Neulasta. 12. Urinary tract infection, Escherichia coli-11/03/2013. 13. Venous engorgement at the left anterior chest and left arm with swelling of the left arm and hand. Venous Doppler 11/14/2013 positive for DVT. She began Lovenox 11/14/2013 and was switched to xarelto 12/10/2013. Xarelto discontinued early January 2016. 14. Fever 11/20/2013. Blood and urine cultures negative. The fever resolved within 24 hours. She continued to have a single episode of fever following chemotherapy 15. History of a herpetic lip lesion 16. 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 17. Escherichia coli bacteremia, urinary source, 01/14/2014. She completed a 10 day course of ceftriaxone. 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 Anxiety/depression. 20. Port-A-Cath removal 06/09/2014. 21. Escherichia coli urinary tract infection 06/24/2014. She completed a course of ciprofloxacin. 22. frequent bowel movements following surgery/radiation -status post placement of a sacral stimulator 11/21/2014 23. CT 06/16/2015 with new left hydronephrosis status post cystoscopy with placement of a double-J stent 07/17/2015. No tumors or stones were seen in the bladder. Both ureteral orifices were unremarkable. On the left side retrograde study was performed. The distal ureter was narrowed and then a little bit more dilated proximally with hydronephrosis noted. There was no evidence of intramural tumor but the area did seem a bit strictured.  Stent removed, persistent hydronephrosis, followed by Dr. Amalia Hailey  CT 05/06/2016-increased left hydronephrosis  Ultrasound 09/09/2016-stable left hydroureteronephrosis  Left nephrectomy 04/28/2017  24.Compression of right iliac vein on CT 03/16/2018-placed on Xarelto anticoagulation, persistent swelling of the right lower  extremity, negative Doppler 03/12/2018 and 06/19/2018    Disposition: [name] has completed 3 cycles of salvage therapy with FOLFOX.  She has tolerated the FOLFOX well.  She has mild neutropenia today.  Scheduled chemotherapy 06/23/2018 will be held and rescheduled for 06/27/2018.  G-CSF support will be added with the next cycle of chemotherapy.  She reports tolerating G-CSF well in the past.  She will call for a recurrent fever.  She has a urinary tract infection.  She will continue Macrobid.  She will continue OxyContin and hydrocodone as needed for pain.  Hopefully the headache will improve over the next few weeks.  We will consider placing her on prednisone if the headache persists.  She will return for an office visit and FOLFOX on 06/27/2018.  The plan is to obtain a restaging CT evaluation after cycle 5.  She would like to delay cycle 5 until after her son's wedding.  Cycle 5 will be scheduled 07/23/2018.  25 minutes were spent with the patient today.  The majority of the time was used for counseling and coordination of care.  Betsy Coder, MD  06/22/2018  3:28 PM

## 2018-06-22 NOTE — Addendum Note (Signed)
Addended by: Tania Ade on: 06/22/2018 04:28 PM   Modules accepted: Orders

## 2018-06-22 NOTE — Telephone Encounter (Signed)
Printed calendar and avs. °

## 2018-06-23 ENCOUNTER — Ambulatory Visit: Payer: 59

## 2018-06-23 LAB — URINE CULTURE: Culture: <10000 — AB

## 2018-06-26 ENCOUNTER — Telehealth: Payer: Self-pay | Admitting: *Deleted

## 2018-06-26 ENCOUNTER — Telehealth: Payer: Self-pay | Admitting: Oncology

## 2018-06-26 NOTE — Telephone Encounter (Signed)
Called patient to let the patient know her treatment was added for 01/09.  Patient stated she had concerns.  (I informed the doc and the nurse)

## 2018-06-26 NOTE — Telephone Encounter (Signed)
-----   Message from Hayden Pedro, PA-C sent at 06/26/2018 10:50 AM EST ----- Hey! Can you let her know she needs to touch base with Dr. Vertell Limber? I don't know how he handles these post surgical patients symptoms. She had xrt then the next day resection. So he needs to know about whats going on ----- Message ----- From: Tania Ade, RN Sent: 06/26/2018   9:58 AM EST To: [name], MD, Hayden Pedro, PA-C  Please see telephone note re: headaches per Dr. Gearldine Shown request.

## 2018-06-26 NOTE — Telephone Encounter (Signed)
Patient reports severe (rated 8/10 ) headaches on the operative side. It is throbbing in nature. Takes oxycontin every 12 hours and hydrocodone/apap every 6 hours and it will ease off for about 4 hours and then return. Activity brings it on and she is having more nausea as well. RN inquired about her urinary symptoms and she reports this is better with Macrobid. Her right leg swelling has improved with use of compression stocking. Anterior and posterior calf are tender, but not red or hot. Doppler last week was negative. Per Dr. Benay Spice: Contact radiation oncology to assess and manage her headache.

## 2018-06-26 NOTE — Telephone Encounter (Signed)
Instructed patient to call Dr. Vertell Limber per the advice of radiation oncology. Patient understands and agrees.

## 2018-06-26 NOTE — Telephone Encounter (Signed)
Note routed to radiation oncology physician/PA as well as alerted collaborative nurse for Dr. Lisbeth Renshaw.

## 2018-06-27 ENCOUNTER — Other Ambulatory Visit: Payer: Self-pay | Admitting: Nurse Practitioner

## 2018-06-28 ENCOUNTER — Telehealth: Payer: Self-pay | Admitting: Nurse Practitioner

## 2018-06-28 ENCOUNTER — Inpatient Hospital Stay: Payer: Medicare Other

## 2018-06-28 ENCOUNTER — Inpatient Hospital Stay (HOSPITAL_BASED_OUTPATIENT_CLINIC_OR_DEPARTMENT_OTHER): Payer: Medicare Other | Admitting: Nurse Practitioner

## 2018-06-28 ENCOUNTER — Encounter: Payer: Self-pay | Admitting: Nurse Practitioner

## 2018-06-28 DIAGNOSIS — N390 Urinary tract infection, site not specified: Secondary | ICD-10-CM | POA: Diagnosis not present

## 2018-06-28 DIAGNOSIS — R509 Fever, unspecified: Secondary | ICD-10-CM

## 2018-06-28 DIAGNOSIS — C20 Malignant neoplasm of rectum: Secondary | ICD-10-CM

## 2018-06-28 DIAGNOSIS — C7931 Secondary malignant neoplasm of brain: Secondary | ICD-10-CM | POA: Diagnosis not present

## 2018-06-28 DIAGNOSIS — C770 Secondary and unspecified malignant neoplasm of lymph nodes of head, face and neck: Secondary | ICD-10-CM | POA: Diagnosis not present

## 2018-06-28 DIAGNOSIS — Z5111 Encounter for antineoplastic chemotherapy: Secondary | ICD-10-CM | POA: Diagnosis not present

## 2018-06-28 DIAGNOSIS — Z95828 Presence of other vascular implants and grafts: Secondary | ICD-10-CM

## 2018-06-28 DIAGNOSIS — R319 Hematuria, unspecified: Secondary | ICD-10-CM

## 2018-06-28 LAB — CBC WITH DIFFERENTIAL (CANCER CENTER ONLY)
ABS IMMATURE GRANULOCYTES: 0.1 10*3/uL — AB (ref 0.00–0.07)
Band Neutrophils: 4 %
Basophils Absolute: 0 10*3/uL (ref 0.0–0.1)
Basophils Relative: 0 %
Eosinophils Absolute: 0.2 10*3/uL (ref 0.0–0.5)
Eosinophils Relative: 4 %
HEMATOCRIT: 31.9 % — AB (ref 36.0–46.0)
Hemoglobin: 10.3 g/dL — ABNORMAL LOW (ref 12.0–15.0)
Lymphocytes Relative: 17 %
Lymphs Abs: 0.9 10*3/uL (ref 0.7–4.0)
MCH: 29.8 pg (ref 26.0–34.0)
MCHC: 32.3 g/dL (ref 30.0–36.0)
MCV: 92.2 fL (ref 80.0–100.0)
Metamyelocytes Relative: 1 %
Monocytes Absolute: 0.7 10*3/uL (ref 0.1–1.0)
Monocytes Relative: 12 %
NRBC: 0.4 % — AB (ref 0.0–0.2)
Neutro Abs: 3.6 10*3/uL (ref 1.7–17.7)
Neutrophils Relative %: 62 %
Platelet Count: 440 10*3/uL — ABNORMAL HIGH (ref 150–400)
RBC: 3.46 MIL/uL — ABNORMAL LOW (ref 3.87–5.11)
RDW: 15.3 % (ref 11.5–15.5)
WBC Count: 5.5 10*3/uL (ref 4.0–10.5)

## 2018-06-28 LAB — CMP (CANCER CENTER ONLY)
ALBUMIN: 3.2 g/dL — AB (ref 3.5–5.0)
ALT: 17 U/L (ref 0–44)
AST: 20 U/L (ref 15–41)
Alkaline Phosphatase: 69 U/L (ref 38–126)
Anion gap: 13 (ref 5–15)
BUN: 13 mg/dL (ref 6–20)
CO2: 23 mmol/L (ref 22–32)
Calcium: 9.4 mg/dL (ref 8.9–10.3)
Chloride: 104 mmol/L (ref 98–111)
Creatinine: 1.18 mg/dL — ABNORMAL HIGH (ref 0.44–1.00)
GFR, Est AFR Am: >60 mL/min (ref >60)
GFR, Estimated: 52 mL/min — ABNORMAL LOW (ref >60)
GLUCOSE: 83 mg/dL (ref 70–99)
Potassium: 4.2 mmol/L (ref 3.5–5.1)
Sodium: 140 mmol/L (ref 135–145)
Total Bilirubin: 0.3 mg/dL (ref 0.3–1.2)
Total Protein: 7.3 g/dL (ref 6.5–8.1)

## 2018-06-28 LAB — CEA (IN HOUSE-CHCC): CEA (CHCC-IN HOUSE): 10.19 ng/mL — AB (ref 0.00–5.00)

## 2018-06-28 MED ORDER — LEUCOVORIN CALCIUM INJECTION 350 MG
400.0000 mg/m2 | Freq: Once | INTRAVENOUS | Status: AC
  Administered 2018-06-28: 720 mg via INTRAVENOUS
  Filled 2018-06-28: qty 36

## 2018-06-28 MED ORDER — SODIUM CHLORIDE 0.9% FLUSH
10.0000 mL | INTRAVENOUS | Status: DC | PRN
  Administered 2018-06-28: 10 mL
  Filled 2018-06-28: qty 10

## 2018-06-28 MED ORDER — DEXTROSE 5 % IV SOLN
Freq: Once | INTRAVENOUS | Status: AC
  Administered 2018-06-28: 10:00:00 via INTRAVENOUS
  Filled 2018-06-28: qty 250

## 2018-06-28 MED ORDER — SODIUM CHLORIDE 0.9 % IV SOLN
2400.0000 mg/m2 | INTRAVENOUS | Status: DC
  Administered 2018-06-28: 4300 mg via INTRAVENOUS
  Filled 2018-06-28: qty 86

## 2018-06-28 MED ORDER — HYDROCODONE-ACETAMINOPHEN 10-325 MG PO TABS: 1.0000 | ORAL_TABLET | Freq: Four times a day (QID) | ORAL | 0 refills | Status: AC | PRN

## 2018-06-28 MED ORDER — PALONOSETRON HCL INJECTION 0.25 MG/5ML
INTRAVENOUS | Status: AC
  Filled 2018-06-28: qty 5

## 2018-06-28 MED ORDER — DEXAMETHASONE SODIUM PHOSPHATE 10 MG/ML IJ SOLN
10.0000 mg | Freq: Once | INTRAMUSCULAR | Status: AC
  Administered 2018-06-28: 10 mg via INTRAVENOUS

## 2018-06-28 MED ORDER — DEXAMETHASONE SODIUM PHOSPHATE 10 MG/ML IJ SOLN
INTRAMUSCULAR | Status: AC
  Filled 2018-06-28: qty 1

## 2018-06-28 MED ORDER — PALONOSETRON HCL INJECTION 0.25 MG/5ML
0.2500 mg | Freq: Once | INTRAVENOUS | Status: AC
  Administered 2018-06-28: 0.25 mg via INTRAVENOUS

## 2018-06-28 MED ORDER — OXALIPLATIN CHEMO INJECTION 100 MG/20ML
83.0000 mg/m2 | Freq: Once | INTRAVENOUS | Status: AC
  Administered 2018-06-28: 150 mg via INTRAVENOUS
  Filled 2018-06-28: qty 20

## 2018-06-28 MED ORDER — FLUOROURACIL CHEMO INJECTION 2.5 GM/50ML
400.0000 mg/m2 | Freq: Once | INTRAVENOUS | Status: AC
  Administered 2018-06-28: 700 mg via INTRAVENOUS
  Filled 2018-06-28: qty 14

## 2018-06-28 NOTE — Patient Instructions (Signed)
[facility] Discharge Instructions for Patients Receiving Chemotherapy  Today you received the following chemotherapy agents: Oxaliplatin, Leucovorin, and Adrucil.  To help prevent nausea and vomiting after your treatment, we encourage you to take your nausea medication as directed.   If you develop nausea and vomiting that is not controlled by your nausea medication, call the clinic.   BELOW ARE SYMPTOMS THAT SHOULD BE REPORTED IMMEDIATELY:  *FEVER GREATER THAN 100.5 F  *CHILLS WITH OR WITHOUT FEVER  NAUSEA AND VOMITING THAT IS NOT CONTROLLED WITH YOUR NAUSEA MEDICATION  *UNUSUAL SHORTNESS OF BREATH  *UNUSUAL BRUISING OR BLEEDING  TENDERNESS IN MOUTH AND THROAT WITH OR WITHOUT PRESENCE OF ULCERS  *URINARY PROBLEMS  *BOWEL PROBLEMS  UNUSUAL RASH Items with * indicate a potential emergency and should be followed up as soon as possible.  Feel free to call the clinic should you have any questions or concerns. The clinic phone number is [phone].  Please show the CHEMO ALERT CARD at check-in to the Emergency Department and triage nurse.   

## 2018-06-28 NOTE — Progress Notes (Signed)
Left message with Dr. Melven Sartorius office requesting they fax post craniotomy information sheet on what to expect afterwards to provide patient. She does not recall receiving anything about this.

## 2018-06-28 NOTE — Patient Instructions (Signed)
Craniotomy, Care After This sheet gives you information about how to care for yourself after your procedure. Your health care provider may also give you more specific instructions. If you have problems or questions, contact your health care provider. What can I expect after the procedure? After your procedure, it is common to have:  Headaches.  Numbness in some areas of your scalp.  A scalp that feels spongy due to the fluid under it. This will gradually get better. Follow these instructions at home: Incision care   Follow instructions from your health care provider about how to take care of your incision and pin insertion sites. Make sure you: ? Wash your hands with soap and water before you change your bandage (dressing). If soap and water are not available, use hand sanitizer. ? Change your dressing as told by your health care provider. ? Leave stitches (sutures), skin glue, or adhesive strips in place. These skin closures may need to stay in place for 2 weeks or longer. If adhesive strip edges start to loosen and curl up, you may trim the loose edges. Do not remove adhesive strips completely unless your health care provider tells you to do that.  Check your incision area and pin insertion sites every day for signs of infection. Check for: ? Redness, swelling, or pain. ? Fluid or blood. ? Warmth. ? Pus or a bad smell.  Do not take baths, swim, or use a hot tub until your health care provider approves. Activity  Do not lift anything that is heavier than 10 lb (4.5 kg) for as long as your health care provider recommends. This is usually about 2 weeks.  Rest when you get tired. You will need extra rest for several weeks. When lying down, keep your head elevated at a 30-degree angle. You can do this with some pillows. This helps keep brain swelling down and prevents increased pressure in the head. Ask your health care provider when you can go back to sleeping flat.  Avoid contact  sports for 1 year or as told by your health care provider.  Do not drive until your health care provider approves.  Limit your activities as told by your health care provider. You may then gradually increase your activities as instructed.  Ask your health care provider when you can go back to work. General instructions   Wear a helmet as told by your health care provider.  Do not drink alcohol.  Take over-the-counter and prescription medicines only as told by your health care provider.  To prevent or treat constipation while you are taking prescription pain medicine, your health care provider may recommend that you: ? Drink enough fluid to keep your urine clear or pale yellow. ? Take over-the-counter or prescription medicines. ? Eat foods that are high in fiber, such as fresh fruits and vegetables, whole grains, and beans. ? Limit foods that are high in fat and processed sugars, such as fried and sweet foods. Contact a health care provider if:  You have redness, swelling, or pain around your incision area or pin insertion sites.  You have fluid or blood coming from your incision area or pin insertion sites.  Your incision area or pin insertion sites feel warm to the touch.  You have pus or a bad smell coming from your incision area or pin insertion sites.  You have a fever. Get help right away if:  You have a severe headache.  You have a seizure.  You feel confused.  You have nausea and vomiting that will not stop.  You develop weakness or numbness on one side of your body.  You develop slurred speech.  You have chest pain, stiff neck, or trouble breathing.  You have blurry vision or loss of vision.  You have an increase in swelling or bruising around the eyes.  Your wound breaks open after the sutures or staples have been removed.  You are dizzy or faint.  You have a rash. Summary  After the procedure, it is common to have headaches and numbness in some  areas of the scalp.  Check your incision area and pin insertion sites every day for signs of infection such as redness, swelling, drainage, or pus.  Return to activities slowly and rest when you get tired.  To avoid constipation from taking pain medicines, eat foods that are high in fiber, such as fresh fruits and vegetables, whole grains, and beans. Drink plenty of fluid. This information is not intended to replace advice given to you by your health care provider. Make sure you discuss any questions you have with your health care provider. Document Released: 09/06/2005 Document Revised: 06/15/2016 Document Reviewed: 06/15/2016 Elsevier Interactive Patient Education  2019 [employer].

## 2018-06-28 NOTE — Telephone Encounter (Signed)
No 1/9 los.

## 2018-06-28 NOTE — Progress Notes (Signed)
[place] OFFICE PROGRESS NOTE   Diagnosis: Rectal cancer  INTERVAL HISTORY:   Ms. Brodhead returns as scheduled.  She completed cycle 3 FOLFOX 06/07/2018.  Cycle 4 was held on 06/23/2018 due to mild neutropenia.  She contacted the office on 06/26/2018 to report severe headaches.  Our office contacted radiation oncology.  The recommendation was to contact Dr. Vertell Limber.  She reports the headaches persist but overall are better.  Pain is well controlled with OxyContin every 12 hours and one breakthrough pain pill a day, usually hydrocodone.  She has had no nausea since yesterday.  Urinary symptoms have resolved.  No further fever.  No diarrhea.  No change in neuropathy since last week.  She had some mouth sores which have resolved.  She overall is beginning to feel better.  Objective:  Vital signs in last 24 hours:  Blood pressure 118/79, pulse 86, temperature 98.7 F (37.1 C), resp. rate 20, SpO2 100 %.    HEENT: No thrush or ulcers. Resp: Lungs clear bilaterally. Cardio: Regular rate and rhythm. GI: Abdomen soft and nontender.  No hepatomegaly. Vascular: Trace edema throughout the right leg. Neuro: Alert and oriented.  Vibratory sense intact over the fingertips per tuning fork exam. Skin: No rash. Port-A-Cath without erythema.   Lab Results:  Lab Results  Component Value Date   WBC 5.5 06/28/2018   HGB 10.3 (L) 06/28/2018   HCT 31.9 (L) 06/28/2018   MCV 92.2 06/28/2018   PLT 440 (H) 06/28/2018   NEUTROABS PENDING 06/28/2018    Imaging:  No results found.  Medications: I have reviewed the patient's current medications.  Assessment/Plan: 1. Rectal cancer, clinical stage III (T3 N1) status post biopsy of a rectal mass on 05/24/2013 confirming adenocarcinoma. Endoscopic ultrasound 05/31/2013 measured the mass at 11 cm from the anal verge, uT3uN1.  Initiation of radiation and concurrent Xeloda 06/18/2013; completion 07/26/2013.   Status post low anterior  resection 09/02/2013. Invasive adenocarcinoma, 3.7 cm, negative margins; metastatic carcinoma in 7 of 11 lymph nodes; extensive lymph vascular involvement by tumor.MSI-stable, no loss of mismatch repair protein expression  Cycle 1 adjuvant FOLFOX 10/15/2013.   Cycle 2 adjuvant FOLFOX 10/29/2013.   Cycle 3 adjuvant FOLFOX 11/14/2013.   Cycle 4 adjuvant FOLFOX 11/27/2013.   Cycle 5 adjuvant FOLFOX 12/10/2013   Cycle 6 adjuvant FOLFOX 12/24/2013.   Cycle 7 adjuvant FOLFOX 01/14/2014.   Cycle 8 adjuvant FOLFOX 01/28/2014   Cycle 9 adjuvant FOLFOX 02/11/2014 (oxaliplatin dose reduced secondary to thrombocytopenia)   Surveillance CT scans 06/02/2014 with no evidence of recurrent rectal cancer  surveillance CT scans 06/16/2015 with no evidence of recurrent rectal cancer, 10 mm right external iliac node , increased amount of loculated air associated with the presacral soft tissue thickening , new left hydronephrosis  Surveillance CT scans 05/06/2016-negative for recurrent rectal cancer, decreased right external iliac node, persistent left hydronephrosis  CT abdomen/pelvis 07/05/2017-new left para-aortic, aortocaval, right common iliac and right external iliac lymphadenopathy.  CT neck/chest 07/13/2017-enlarged left supraclavicular nodes, new paratracheal nodes  Ultrasound-guided biopsy of a left supraclavicular lymph node 07/25/2017-metastatic adenocarcinoma consistent with colorectal cancer,MSI-stable, tumor mutation burden-10, K-ras S19mtation  Cycle 1 FOLFIRI 08/28/2017  Cycle 2 FOLFIRI 09/11/2017  Cycle 3 FOLFIRI 09/25/2017  Cycle 4 FOLFIRI 10/09/2017  Cycle 5 FOLFIRI 10/23/2017  CTs 11/14/2017-slight decrease in left supraclavicular adenopathy, slight increase and decrease in mediastinal/retroperitoneal/pelvic nodes, no new disease  Cycle 6 FOLFIRI 11/14/2017  Cycle 7 FOLFIRI 11/27/2017  Cycle 8 FOLFIRI 12/11/2017  Cycle 9 FOLFIRI  01/03/2018  Cycle 10 FOLFIRI  01/18/2018  Restaging CTs 01/29/2018-stable thoracic adenopathywith mildly improved abdominal adenopathy. Similar appearance of presacral soft tissue density. Suspected small fistula from the posterior margin of the rectum or anastomosis to the presacral region.  Cycle 11 FOLFIRI 01/31/2018  Cycle 12 FOLFIRI 02/20/2018  CT pelvis 03/16/2018-mild progression of pelvic lymphadenopathy with compression of the right iliac vein by an enlarged right obturator node  Cycle 1 FOLFOX 04/23/2018  CT emergency department 04/24/2018-slight interval increase in retroperitoneal adenopathy and right pelvic sidewall mass.  Cycle 2 FOLFOX 05/10/2018  Brain CT 05/10/2018- left parietal metastasis with extensive vasogenic edema  Brain MRI 05/22/2018- 3 cm solitary left parietal metastasis with extensive vasogenic edema  SRS left parietal metastasis 05/30/2018, brain mass resection 05/31/2018-metastatic adenocarcinoma consistent with primary rectal adenocarcinoma  Cycle 3 FOLFOX 06/07/2018  Cycle 4 FOLFOX 06/28/2018, G-CSF support added 2. Post colonoscopy bowel perforation confirmed on abdomen CT 05/28/2013. 3. Single indeterminate liver lesion on the abdomen CT 05/28/2013. MRI on 06/18/2013 confirmed 2 tiny liver lesions, indeterminate-favored to be benign cysts. 4. History of cervical and lumbar disc disease.  5. History of HELLP syndrome with first pregnancy.  6. Delayed nausea following cycle 1 FOLFOX. She receives Aloxi and Emend. She continues to experience delayed nausea. Prophylactic Decadron added with cycle 4. 7. History of pain with bowel movements. 8. Anastomotic leak on CT 09/13/2013. Status post exploratory laparotomy with drainage of a pelvic abscess, diverting loop ileostomy. Drainage catheter removed 10/23/2013.  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 Perineal numbness, urinary  retention. The urinary retention has improved. 10. Perineal pain-improved. 11. Neutropenia secondary chemotherapy-she received Neulasta. 12. Urinary tract infection, Escherichia coli-11/03/2013. 13. Venous engorgement at the left anterior chest and left arm with swelling of the left arm and hand. Venous Doppler 11/14/2013 positive for DVT. She began Lovenox 11/14/2013 and was switched to xarelto 12/10/2013. Xarelto discontinued early January 2016. 14. Fever 11/20/2013. Blood and urine cultures negative. The fever resolved within 24 hours. She continued to have a single episode of fever following chemotherapy 15. History of a herpetic lip lesion 16. 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 17. Escherichia coli bacteremia, urinary source, 01/14/2014. She completed a 10 day course of ceftriaxone. 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 Anxiety/depression. 20. Port-A-Cath removal 06/09/2014. 21. Escherichia coli urinary tract infection 06/24/2014. She completed a course of ciprofloxacin. 22. frequent bowel movements following surgery/radiation -status post placement of a sacral stimulator 11/21/2014 23. CT 06/16/2015 with new left hydronephrosis status post cystoscopy with placement of a double-J stent 07/17/2015. No tumors or stones were seen in the bladder. Both ureteral orifices were unremarkable. On the left side retrograde study was performed. The distal ureter was narrowed and then a little bit more dilated proximally with hydronephrosis noted. There was no evidence of intramural tumor but the area did seem a bit strictured.  Stent removed, persistent hydronephrosis, followed by Dr. Amalia Hailey  CT 05/06/2016-increased left hydronephrosis  Ultrasound 09/09/2016-stable left hydroureteronephrosis  Left nephrectomy 04/28/2017  24.Compression of right iliac vein on CT 03/16/2018-placed on Xarelto anticoagulation, persistent swelling of the  right lower extremity, negative Doppler 03/12/2018 and 06/19/2018    Disposition: [name] appears stable.  She has completed 3 cycles of FOLFOX.  Plan to proceed with cycle 4 today as scheduled.  She will receive G-CSF support on the day of pump discontinuation.  We reviewed potential toxicities at today's  visit.  She agrees to proceed.  She reports tolerating G-CSF well when she has received it in the past.  Plan for restaging CTs after she has completed 5 cycles of FOLFOX.  We reviewed the CBC from today.  Counts are adequate for treatment.  The headaches overall are better.  She will return for lab, follow-up and cycle 5 FOLFOX on 07/23/2018.  She will contact the office in the interim with any problems.    Ned Card ANP/GNP-BC   06/28/2018  9:41 AM

## 2018-06-30 ENCOUNTER — Inpatient Hospital Stay: Payer: Medicare Other

## 2018-06-30 VITALS — BP 124/91 | HR 85 | Temp 98.8°F | Resp 17

## 2018-06-30 DIAGNOSIS — C20 Malignant neoplasm of rectum: Secondary | ICD-10-CM

## 2018-06-30 DIAGNOSIS — Z5111 Encounter for antineoplastic chemotherapy: Secondary | ICD-10-CM | POA: Diagnosis not present

## 2018-06-30 MED ORDER — SODIUM CHLORIDE 0.9% FLUSH
10.0000 mL | INTRAVENOUS | Status: DC | PRN
  Administered 2018-06-30: 10 mL
  Filled 2018-06-30: qty 10

## 2018-06-30 MED ORDER — PEGFILGRASTIM-CBQV 6 MG/0.6ML ~~LOC~~ SOSY
PREFILLED_SYRINGE | SUBCUTANEOUS | Status: AC
  Filled 2018-06-30: qty 0.6

## 2018-06-30 MED ORDER — HEPARIN SOD (PORK) LOCK FLUSH 100 UNIT/ML IV SOLN
500.0000 [IU] | Freq: Once | INTRAVENOUS | Status: AC | PRN
  Administered 2018-06-30: 500 [IU]
  Filled 2018-06-30: qty 5

## 2018-06-30 MED ORDER — PEGFILGRASTIM-CBQV 6 MG/0.6ML ~~LOC~~ SOSY
6.0000 mg | PREFILLED_SYRINGE | Freq: Once | SUBCUTANEOUS | Status: AC
  Administered 2018-06-30: 6 mg via SUBCUTANEOUS

## 2018-07-02 ENCOUNTER — Ambulatory Visit: Payer: 59 | Attending: Radiation Oncology | Admitting: Radiation Oncology

## 2018-07-02 ENCOUNTER — Telehealth: Payer: Self-pay | Admitting: *Deleted

## 2018-07-02 ENCOUNTER — Telehealth: Payer: Self-pay | Admitting: Radiation Oncology

## 2018-07-02 NOTE — Telephone Encounter (Signed)
Jury summons for 07/23/18 at 0815 for [employer]. She is juror [phone]. Requests MD dictate a letter since she is in treatment that day. She will pick up the letter after MD dictates.

## 2018-07-02 NOTE — Telephone Encounter (Signed)
I called the patient to follow up on how she was doing, but had to leave a message asking her to call back. She missed her 1 month follow up appointment with me today.

## 2018-07-02 NOTE — Telephone Encounter (Signed)
"  I've been summoned but don't feel well enough to serve jury duty.  Is there a letter Dr. Benay Spice has I could pick up to be dismissed from jury duty?" Call transferred to collaborative with advice to provide [place] number.

## 2018-07-03 ENCOUNTER — Telehealth: Payer: Self-pay | Admitting: *Deleted

## 2018-07-03 ENCOUNTER — Encounter: Payer: Self-pay | Admitting: Nurse Practitioner

## 2018-07-03 NOTE — Telephone Encounter (Signed)
Notified that jury duty letter was routed to her Mychart. She can download from there or pick up hardcopy in office.

## 2018-07-04 ENCOUNTER — Telehealth: Payer: Self-pay | Admitting: Radiation Oncology

## 2018-07-04 ENCOUNTER — Ambulatory Visit: Payer: 59

## 2018-07-04 ENCOUNTER — Other Ambulatory Visit: Payer: 59

## 2018-07-04 ENCOUNTER — Other Ambulatory Visit: Payer: Self-pay | Admitting: Radiation Oncology

## 2018-07-04 ENCOUNTER — Encounter: Payer: Self-pay | Admitting: Radiation Oncology

## 2018-07-04 ENCOUNTER — Ambulatory Visit: Payer: 59 | Admitting: Oncology

## 2018-07-04 DIAGNOSIS — C7931 Secondary malignant neoplasm of brain: Secondary | ICD-10-CM

## 2018-07-04 NOTE — Telephone Encounter (Signed)
I called the patient to follow up with her regarding her since she missed her 1 month appointment. She reports the headache she previously had has not resolved. She stopped steroid therapy about two weeks ago and has what she describes as facial swelling, likely moon facies. I let her know we'd be scheduling her first brain MRI scan in March. I will also reach out to PT to see if they think there's any benefit to massage to reduce her facial swelling. She is concerned because her son's wedding is the first weekend in February, and would like him to remember her looking "less sickly" that day.

## 2018-07-09 ENCOUNTER — Ambulatory Visit: Payer: 59 | Attending: Radiation Oncology | Admitting: Rehabilitation

## 2018-07-09 ENCOUNTER — Encounter: Payer: Self-pay | Admitting: Rehabilitation

## 2018-07-09 ENCOUNTER — Other Ambulatory Visit: Payer: Self-pay

## 2018-07-09 DIAGNOSIS — R600 Localized edema: Secondary | ICD-10-CM | POA: Diagnosis present

## 2018-07-09 DIAGNOSIS — R208 Other disturbances of skin sensation: Secondary | ICD-10-CM

## 2018-07-09 DIAGNOSIS — I890 Lymphedema, not elsewhere classified: Secondary | ICD-10-CM

## 2018-07-09 NOTE — Therapy (Signed)
[place], [place], [address] Phone: [phone]   Fax:  [phone]  Physical Therapy Evaluation  Patient Details  Name: [name] MRN: [id number] Date of Birth: Aug 29, 1960 Referring Provider (PT): Shona Simpson PA-C   Encounter Date: 07/09/2018  PT End of Session - 07/09/18 0850    Visit Number  1    Number of Visits  10    Date for PT Re-Evaluation  08/10/18    Authorization Type  100 combined limit    PT Start Time  0800    PT Stop Time  0842    PT Time Calculation (min)  42 min    Activity Tolerance  Patient tolerated treatment well    Behavior During Therapy  [facility] for tasks assessed/performed       Past Medical History:  Diagnosis Date  . Anxiety   . Arthritis 08-27-13   at present has ruptured disc- lower back-not an issue now  . Blood infection ([place])    E Coli after UTI   . Colon cancer ([place]) 08-27-13   radiation /chemo -last ended 4 weeks(Dr. Benay Spice)  . DVT (deep venous thrombosis) ([place])    left upper extremity currently has stopped xarelto   . History of radiation therapy 06/18/13-07/26/13   rectal 50.4Gy total dose  . Hypertension    toxemia with pregnancy 1990  . IUD (intrauterine device) in place 08-27-13   Mirena Implant inplace  . Nerve damage    numbness in pelvic area from rectal rection straining to empty bladder   . PONV (postoperative nausea and vomiting)    scopalamine patch helped  . Stress incontinence   . UTI (lower urinary tract infection)    hx of     Past Surgical History:  Procedure Laterality Date  . APPENDECTOMY    . APPLICATION OF CRANIAL NAVIGATION N/A 05/31/2018   Procedure: APPLICATION OF CRANIAL NAVIGATION;  Surgeon: Erline Levine, MD;  Location: [facility];  Service: Neurosurgery;  Laterality: N/A;  . BACK SURGERY    . bladder study      pressure readings had approx 1.5 months ago   . BREAST SURGERY     reduction surgery  . CERVICAL DISC  ARTHROPLASTY  01/03/2012   Procedure: CERVICAL ANTERIOR DISC ARTHROPLASTY;  Surgeon: Charlie Pitter, MD;  Location: [facility];  Service: Neurosurgery;  Laterality: N/A;  Cervical Anterior Disc Arthorplasty Five-Six  . CESAREAN SECTION     x2  . CRANIOTOMY Left 05/31/2018   Procedure: Left Parietal craniotomy for tumor excision with brainlab;  Surgeon: Erline Levine, MD;  Location: [facility];  Service: Neurosurgery;  Laterality: Left;  Left Parietal craniotomy for tumor excision with brainlab  . EUS N/A 05/31/2013   Procedure: LOWER ENDOSCOPIC ULTRASOUND (EUS);  Surgeon: Beryle Beams, MD;  Location: [facility];  Service: Endoscopy;  Laterality: N/A;  . ILEO LOOP COLOSTOMY CLOSURE N/A 05/02/2014   Procedure: LAPAROSCOPIC ASSISTED REVERSAL ILEOSTOMY AND LYSIS OF ADHESIONS;  Surgeon: [name], MD;  Location: [facility];  Service: General;  Laterality: N/A;  . INTRAUTERINE DEVICE INSERTION  2006   [facility]  . IR FLUORO GUIDE PORT INSERTION RIGHT  08/25/2017  . IR US GUIDE VASC ACCESS RIGHT  08/25/2017  . KNEE ARTHROSCOPY Left 08-27-13   torn meniscus repair left   . LAPAROSCOPIC LOW ANTERIOR RESECTION N/A 09/02/2013   Procedure: LAPAROSCOPIC LOW ANTERIOR RESECTION;  Surgeon: [name], MD;  Location: [facility];  Service: General;  Laterality: N/A;  . LAPAROTOMY N/A 09/13/2013   Procedure: EXPLORATORY LAPAROTOMY / [place] SIGMOIDOSCOPY/  DRAINAGE OF PELVIC ABSCESS WITH  DIVERTING  LOOP  ILEOSTOMY;  Surgeon: [name], MD;  Location: [facility];  Service: General;  Laterality: N/A;  . LYSIS OF ADHESION  05/02/2014   Procedure: LYSIS OF ADHESION;  Surgeon: [name], MD;  Location: [facility];  Service: General;;  . PORT-A-CATH REMOVAL Left 06/09/2014   Procedure: REMOVAL PORT-A-CATH  (MINOR PROCEDURE) ;  Surgeon: [name], MD;  Location: [facility];  Service: General;  Laterality: Left;  . PORTACATH PLACEMENT Left 10/11/2013   Procedure: INSERTION PORT-A-CATH;  Surgeon: [name],  MD;  Location: [facility];  Service: General;  Laterality: Left;  . REFRACTIVE SURGERY      There were no vitals filed for this visit.   Subjective Assessment - 07/09/18 0800    Subjective  I want to see if we can decrease the swelling in the face.  I also have it in my Left leg.  I was on the steroids x 3 weeks and now off of it x 3 weeks and it has gone down.  It does not fluctuate     Pertinent History  Rectal cancer, clinical stage III (T3 N1) status post biopsy of a rectal mass on 05/24/2013 confirming adenocarcinoma. Radiation and Xeloda 06/18/13-07/26/13.  Low anterior resection 09/02/13 7/11 lymph nodes positive. FOLFOX chemotherapy 10/15/13-02/11/14. 07/05/17 new paraaortic, right common iliac and right external iliac lymphadenopathy. Biopsy of Left supraclavicular node positive for metastatic adenocarcinoma. 12 cycles of FOLFIRI 08/28/17 to 02/20/18. 05/10/18 left parietal metastasis.  05/31/18 brain mass resection. History of DVT 2015 in Left arm. Left nephrectomy 04/28/2017 due to nerve damage from surgery and chronic UTIs    Patient Stated Goals  see if the face can go down the wedding     Currently in Pain?  No/denies         [facility] PT Assessment - 07/09/18 0001      Assessment   Medical Diagnosis  metastatic rectal cancer    Referring Provider (PT)  Shona Simpson PA-C    Onset Date/Surgical Date  05/31/18   brain resection   Hand Dominance  Right    Prior Therapy  no      Precautions   Precaution Comments  active cancer supraclavicular nodes, lymphedema, history of pelvic radiation, history of Lt UE DVT in 2015      Restrictions   Weight Bearing Restrictions  No      Balance Screen   Has the patient fallen in the past 6 months  No    Has the patient had a decrease in activity level because of a fear of falling?   No    Is the patient reluctant to leave their home because of a fear of falling?   No      Home Film/video editor residence    Living  Arrangements  Spouse/significant other      Prior Function   Level of Independence  Independent    Vocation  On disability    Leisure  traveling      Cognition   Overall Cognitive Status  Within Functional Limits for tasks assessed      Observation/Other Assessments   Observations  moon facies      Coordination   Gross Motor Movements are Fluid and Coordinated  Yes      Posture/Postural Control   Posture/Postural Control  Postural limitations    Postural Limitations  Rounded Shoulders;Forward head        LYMPHEDEMA/ONCOLOGY QUESTIONNAIRE - 07/09/18 0814      Type   Cancer Type  metastatic rectal cancer      Surgeries   Other Surgery Date  --   rectal resection with LND, brain mass resection   Number Lymph Nodes Removed  11      Date Lymphedema/Swelling Started   Date  --   leg after rectal resection face with steroids     Treatment   Active Chemotherapy Treatment  Yes    Past Chemotherapy Treatment  Yes    Active Radiation Treatment  No    Past Radiation Treatment  Yes    Current Hormone Treatment  No    Past Hormone Therapy  No      What other symptoms do you have   Are you Having Heaviness or Tightness  No    Are you having Pain  No      Lymphedema Assessments   Lymphedema Assessments  Head and Neck      Head and Neck   Right Lateral Nostril at base of nose to medial tragus   11 cm    Left Lateral Nostril at base of nose to medial tragus   11.4 cm    Right Corner of mouth to where ear lobe meets face  10 cm    Left Corner of mouth to where ear lobe meets face  11.2 cm    Other  around neck at chin line 38cm             Objective measurements completed on examination: See above findings.      [facility] PT Treatment/Exercise - 07/09/18 0001      Manual Therapy   Manual Therapy  Manual Lymphatic Drainage (MLD);Edema management    Manual therapy comments  discussed briefly LE treatment and pt currently has a TED hose garment that works.   Interested in getting measured by [facility] for a better stocking    Manual Lymphatic Drainage (MLD)  in supine HOB elevated, bil axilary nodes, avoided supraclavicular nodes due to lymphoma status, cervical nodes, pre and retroauricular nodes, then work on the face chin and cheeks directing fluid towards CLN, deep breathing x 5              PT Education - 07/09/18 0849    Education Details  PT POC, lymphedema vs steroid fat redistribution, LE treatment options    Person(s) Educated  Patient    Methods  Explanation    Comprehension  Verbalized understanding          PT Long Term Goals - 07/09/18 1356      PT LONG TERM GOAL #1   Title  Pt will decrease facial swelling for son's wedding in 2 weeks with use of in clinic and self MLD     Time  2    Period  Weeks    Status  New    Target Date  07/23/18      PT LONG TERM GOAL #2   Title  Pt will be evaluated for Lt LE lymphedema with treatment as indicated    Time  4    Period  Weeks    Status  New    Target Date  08/06/18             Plan - 07/09/18 0851    Clinical Impression Statement  Pt  arrives today with complaints of facial edema from steroids and also for Lt UE edema that has been present since bowel resection in 2015.  Pt currently wanting to attempt treatment trial for facial swelling due to steroids to approve appearance for sons wedding in 2 weeks.  pt educated on difference between steroid fat redistribution and lymphedema but willing to attempt some MLD.  Due to pt history MLD sequence will need to be changed to avoid supraclavicular nodes and using deep breathing instead of deep abdominals.  Pt aware treatment status is palliative.  Pt also concerned about lt LE lymphedema and we will plan to address this after the wedding.  Due to size and palliative status we may just measure pt for appropriate compression stockings.      History and Personal Factors relevant to plan of care:  Rectal cancer, clinical stage III  (T3 N1) status post biopsy of a rectal mass on 05/24/2013 confirming adenocarcinoma. Radiation and Xeloda 06/18/13-07/26/13. Low anterior resection 09/02/13 7/11 lymph nodes positive. FOLFOX chemotherapy 10/15/13-02/11/14. 07/05/17 new paraaortic, right common iliac and right external iliac lymphadenopathy. Biopsy of Left supraclavicular node positive for metastatic adenocarcinoma. 12 cycles of FOLFIRI 08/28/17 to 02/20/18. 05/10/18 left parietal metastasis. 05/31/18 brain mass resection. History of DVT 2015 in Left arm. Left nephrectomy 04/28/2017 due to nerve damage from surgery and chronic UTIs    Clinical Presentation  Evolving    Clinical Presentation due to:  active cancer status, chemotherapy     Clinical Decision Making  Moderate    Rehab Potential  Fair    Clinical Impairments Affecting Rehab Potential  not lymphedema     PT Frequency  2x / week    PT Duration  --   5 weeks    PT Treatment/Interventions  ADLs/Self Care Home Management;Therapeutic exercise;Manual lymph drainage;Compression bandaging;Manual techniques    PT Next Visit Plan  continue facial MLD no supraclavicular nodes or deep abdominals teach MLD, want to come on Friday for Melissa, script back?     Recommended Other Services  script sent for LE stocking, LE request for treatment sent    Consulted and Agree with Plan of Care  Patient       Patient will benefit from skilled therapeutic intervention in order to improve the following deficits and impairments:  Decreased skin integrity, Decreased knowledge of precautions, Decreased knowledge of use of DME, Increased edema  Visit Diagnosis: Localized edema - Plan: PT plan of care cert/re-cert  Lymphedema, not elsewhere classified - Plan: PT plan of care cert/re-cert  Other disturbances of skin sensation - Plan: PT plan of care cert/re-cert     Problem List Patient Active Problem List   Diagnosis Date Noted  . Metastasis to brain ([place]) 05/31/2018  . 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 Port-A-Cath in place 09/11/2017  . History of ileostomy 05/02/2014  . DVT (deep venous thrombosis) ([place]) 01/14/2014  . Urinary tract infection 01/13/2014  . Large bowel perforation ([place]) 09/13/2013  . Rectal cancer, 12 - 15 cm from anal verge 05/29/2013  . Herniation of cervical intervertebral disc with radiculopathy 01/03/2012    Shan Levans, PT 07/09/2018, 2:02 PM  [place], [place], [address] Phone: [phone]   Fax:  [phone]  Name: [name] MRN: [id number] Date of Birth: [birth year]

## 2018-07-10 ENCOUNTER — Other Ambulatory Visit: Payer: Self-pay | Admitting: Radiation Oncology

## 2018-07-10 DIAGNOSIS — C20 Malignant neoplasm of rectum: Secondary | ICD-10-CM

## 2018-07-10 NOTE — Progress Notes (Signed)
During evaluation with PT, the patient was found to have RLE edema felt to be consistent with lymphedema. I've placed orders for them to work on management of this as well.     Carola Rhine, PAC

## 2018-07-11 ENCOUNTER — Encounter: Payer: Self-pay | Admitting: Rehabilitation

## 2018-07-11 ENCOUNTER — Ambulatory Visit: Payer: 59 | Admitting: Rehabilitation

## 2018-07-11 DIAGNOSIS — I890 Lymphedema, not elsewhere classified: Secondary | ICD-10-CM

## 2018-07-11 DIAGNOSIS — R600 Localized edema: Secondary | ICD-10-CM

## 2018-07-11 DIAGNOSIS — R208 Other disturbances of skin sensation: Secondary | ICD-10-CM

## 2018-07-11 NOTE — Therapy (Signed)
[place], [place], [address] Phone: [phone]   Fax:  [phone]  Physical Therapy Treatment  Patient Details  Name: [name] MRN: [id number] Date of Birth: 09-13-1960 Referring Provider (PT): Shona Simpson PA-C   Encounter Date: 07/11/2018  PT End of Session - 07/11/18 1058    Visit Number  2    Number of Visits  10    Date for PT Re-Evaluation  08/10/18    PT Start Time  1020    PT Stop Time  1058    PT Time Calculation (min)  38 min    Activity Tolerance  Patient tolerated treatment well    Behavior During Therapy  [facility] for tasks assessed/performed       Past Medical History:  Diagnosis Date  . Anxiety   . Arthritis 08-27-13   at present has ruptured disc- lower back-not an issue now  . Blood infection ([place])    E Coli after UTI   . Colon cancer ([place]) 08-27-13   radiation /chemo -last ended 4 weeks(Dr. Benay Spice)  . DVT (deep venous thrombosis) ([place])    left upper extremity currently has stopped xarelto   . History of radiation therapy 06/18/13-07/26/13   rectal 50.4Gy total dose  . Hypertension    toxemia with pregnancy 1990  . IUD (intrauterine device) in place 08-27-13   Mirena Implant inplace  . Nerve damage    numbness in pelvic area from rectal rection straining to empty bladder   . PONV (postoperative nausea and vomiting)    scopalamine patch helped  . Stress incontinence   . UTI (lower urinary tract infection)    hx of     Past Surgical History:  Procedure Laterality Date  . APPENDECTOMY    . APPLICATION OF CRANIAL NAVIGATION N/A 05/31/2018   Procedure: APPLICATION OF CRANIAL NAVIGATION;  Surgeon: Erline Levine, MD;  Location: [place];  Service: Neurosurgery;  Laterality: N/A;  . BACK SURGERY    . bladder study      pressure readings had approx 1.5 months ago   . BREAST SURGERY     reduction surgery  . CERVICAL DISC ARTHROPLASTY  01/03/2012   Procedure: CERVICAL  ANTERIOR DISC ARTHROPLASTY;  Surgeon: Charlie Pitter, MD;  Location: [place] [facility];  Service: Neurosurgery;  Laterality: N/A;  Cervical Anterior Disc Arthorplasty Five-Six  . CESAREAN SECTION     x2  . CRANIOTOMY Left 05/31/2018   Procedure: Left Parietal craniotomy for tumor excision with brainlab;  Surgeon: Erline Levine, MD;  Location: [place];  Service: Neurosurgery;  Laterality: Left;  Left Parietal craniotomy for tumor excision with brainlab  . EUS N/A 05/31/2013   Procedure: LOWER ENDOSCOPIC ULTRASOUND (EUS);  Surgeon: Beryle Beams, MD;  [facility];  Service: Endoscopy;  Laterality: N/A;  . ILEO LOOP COLOSTOMY CLOSURE N/A 05/02/2014   Procedure: LAPAROSCOPIC ASSISTED REVERSAL ILEOSTOMY AND LYSIS OF ADHESIONS;  Surgeon: [name], MD;  [facility];  Service: General;  Laterality: N/A;  . INTRAUTERINE DEVICE INSERTION  2006   minera  . IR FLUORO GUIDE PORT INSERTION RIGHT  08/25/2017  . IR US GUIDE VASC ACCESS RIGHT  08/25/2017  . KNEE ARTHROSCOPY Left 08-27-13   torn meniscus repair left   . LAPAROSCOPIC LOW ANTERIOR RESECTION N/A 09/02/2013   Procedure: LAPAROSCOPIC LOW ANTERIOR RESECTION;  Surgeon: [name], MD;  [facility];  Service: General;  Laterality: N/A;  . LAPAROTOMY N/A 09/13/2013  Procedure: EXPLORATORY LAPAROTOMY / FLEXIBLE SIGMOIDOSCOPY/  DRAINAGE OF PELVIC ABSCESS WITH  DIVERTING  LOOP  ILEOSTOMY;  Surgeon: [name], MD;  [facility];  Service: General;  Laterality: N/A;  . LYSIS OF ADHESION  05/02/2014   Procedure: LYSIS OF ADHESION;  Surgeon: [name], MD;  [facility];  Service: General;;  . PORT-A-CATH REMOVAL Left 06/09/2014   Procedure: REMOVAL PORT-A-CATH  (MINOR PROCEDURE) ;  Surgeon: [name], MD;  Location: [place];  Service: General;  Laterality: Left;  . PORTACATH PLACEMENT Left 10/11/2013   Procedure: INSERTION PORT-A-CATH;  Surgeon: [name], MD;  [facility];  Service: General;   Laterality: Left;  . REFRACTIVE SURGERY      There were no vitals filed for this visit.  Subjective Assessment - 07/11/18 1025    Subjective  I don't know if I can tell     Pertinent History  Rectal cancer, clinical stage III (T3 N1) status post biopsy of a rectal mass on 05/24/2013 confirming adenocarcinoma. Radiation and Xeloda 06/18/13-07/26/13.  Low anterior resection 09/02/13 7/11 lymph nodes positive. FOLFOX chemotherapy 10/15/13-02/11/14. 07/05/17 new paraaortic, right common iliac and right external iliac lymphadenopathy. Biopsy of Left supraclavicular node positive for metastatic adenocarcinoma. 12 cycles of FOLFIRI 08/28/17 to 02/20/18. 05/10/18 left parietal metastasis.  05/31/18 brain mass resection. History of DVT 2015 in Left arm. Left nephrectomy 04/28/2017 due to nerve damage from surgery and chronic UTIs    Patient Stated Goals  see if the face can go down the wedding     Currently in Pain?  No/denies                       [facility] Adult PT Treatment/Exercise - 07/11/18 0001      Manual Therapy   Manual therapy comments  gave pt facial MLD handout with instructin on this     Manual Lymphatic Drainage (MLD)  in supine HOB elevated, bil axilary nodes, avoided supraclavicular nodes due to lymphoma status, cervical nodes, pre and retroauricular nodes, then work on the face chin and cheeks directing fluid towards CLN, deep breathing x 5              PT Education - 07/11/18 1058    Education Details  self MLD    Person(s) Educated  Patient    Methods  Explanation;Demonstration;Tactile cues;Verbal cues;Handout    Comprehension  Verbalized understanding;Returned demonstration;Verbal cues required;Tactile cues required          PT Long Term Goals - 07/09/18 1356      PT LONG TERM GOAL #1   Title  Pt will decrease facial swelling for son's wedding in 2 weeks with use of in clinic and self MLD     Time  2    Period  Weeks    Status  New    Target Date  07/23/18       PT LONG TERM GOAL #2   Title  Pt will be evaluated for Lt LE lymphedema with treatment as indicated    Time  4    Period  Weeks    Status  New    Target Date  08/06/18            Plan - 07/11/18 1059    Clinical Impression Statement  LE referral has been placed by provider.  Will save this for after wedding.  Continued facial MLD avoiding abdominals due to discomfort and supraclavicular region due  to lymphoma status     PT Frequency  2x / week    PT Treatment/Interventions  ADLs/Self Care Home Management;Therapeutic exercise;Manual lymph drainage;Compression bandaging;Manual techniques    PT Next Visit Plan  continue facial MLD no supraclavicular nodes or deep abdominals teach MLD, script back?     Consulted and Agree with Plan of Care  Patient       Patient will benefit from skilled therapeutic intervention in order to improve the following deficits and impairments:  Decreased skin integrity, Decreased knowledge of precautions, Decreased knowledge of use of DME, Increased edema  Visit Diagnosis: Localized edema  Lymphedema, not elsewhere classified  Other disturbances of skin sensation     Problem List Patient Active Problem List   Diagnosis Date Noted  . Metastasis to brain ([place]) 05/31/2018  . 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 Port-A-Cath in place 09/11/2017  . History of ileostomy 05/02/2014  . DVT (deep venous thrombosis) ([place]) 01/14/2014  . Urinary tract infection 01/13/2014  . Large bowel perforation ([place]) 09/13/2013  . Rectal cancer, 12 - 15 cm from anal verge 05/29/2013  . Herniation of cervical intervertebral disc with radiculopathy 01/03/2012    Shan Levans, PT 07/11/2018, 11:00 AM  [place], [place], [address] Phone: [phone]   Fax:  [phone]  Name: [name] MRN: [id number] Date of Birth: 1960-09-17

## 2018-07-16 ENCOUNTER — Ambulatory Visit: Payer: 59 | Admitting: Rehabilitation

## 2018-07-16 ENCOUNTER — Encounter: Payer: Self-pay | Admitting: Rehabilitation

## 2018-07-16 DIAGNOSIS — I890 Lymphedema, not elsewhere classified: Secondary | ICD-10-CM

## 2018-07-16 DIAGNOSIS — R600 Localized edema: Secondary | ICD-10-CM

## 2018-07-16 DIAGNOSIS — R208 Other disturbances of skin sensation: Secondary | ICD-10-CM

## 2018-07-16 NOTE — Therapy (Signed)
[place], [place], [address] Phone: [phone]   Fax:  [phone]  Physical Therapy Treatment  Patient Details  Name: [name] MRN: [id number] Date of Birth: 12/05/1960 Referring Provider (PT): Shona Simpson PA-C   Encounter Date: 07/16/2018  PT End of Session - 07/16/18 1145    Visit Number  3    Number of Visits  10    Date for PT Re-Evaluation  08/10/18    Authorization Type  100 combined limit    PT Start Time  1103    PT Stop Time  1142    PT Time Calculation (min)  39 min    Activity Tolerance  Patient tolerated treatment well    Behavior During Therapy  [facility] for tasks assessed/performed       Past Medical History:  Diagnosis Date  . Anxiety   . Arthritis 08-27-13   at present has ruptured disc- lower back-not an issue now  . Blood infection ([place])    E Coli after UTI   . Colon cancer ([place]) 08-27-13   radiation /chemo -last ended 4 weeks(Dr. Benay Spice)  . DVT (deep venous thrombosis) ([place])    left upper extremity currently has stopped xarelto   . History of radiation therapy 06/18/13-07/26/13   rectal 50.4Gy total dose  . Hypertension    toxemia with pregnancy 1990  . IUD (intrauterine device) in place 08-27-13   Mirena Implant inplace  . Nerve damage    numbness in pelvic area from rectal rection straining to empty bladder   . PONV (postoperative nausea and vomiting)    scopalamine patch helped  . Stress incontinence   . UTI (lower urinary tract infection)    hx of     Past Surgical History:  Procedure Laterality Date  . APPENDECTOMY    . APPLICATION OF CRANIAL NAVIGATION N/A 05/31/2018   Procedure: APPLICATION OF CRANIAL NAVIGATION;  Surgeon: Erline Levine, MD;  Location: [place];  Service: Neurosurgery;  Laterality: N/A;  . BACK SURGERY    . bladder study      pressure readings had approx 1.5 months ago   . BREAST SURGERY     reduction surgery  . CERVICAL DISC  ARTHROPLASTY  01/03/2012   Procedure: CERVICAL ANTERIOR DISC ARTHROPLASTY;  Surgeon: Charlie Pitter, MD;  Location: [place] [facility];  Service: Neurosurgery;  Laterality: N/A;  Cervical Anterior Disc Arthorplasty Five-Six  . CESAREAN SECTION     x2  . CRANIOTOMY Left 05/31/2018   Procedure: Left Parietal craniotomy for tumor excision with brainlab;  Surgeon: Erline Levine, MD;  Location: [place];  Service: Neurosurgery;  Laterality: Left;  Left Parietal craniotomy for tumor excision with brainlab  . EUS N/A 05/31/2013   Procedure: LOWER ENDOSCOPIC ULTRASOUND (EUS);  Surgeon: Beryle Beams, MD;  [facility];  Service: Endoscopy;  Laterality: N/A;  . ILEO LOOP COLOSTOMY CLOSURE N/A 05/02/2014   Procedure: LAPAROSCOPIC ASSISTED REVERSAL ILEOSTOMY AND LYSIS OF ADHESIONS;  Surgeon: [name], MD;  [facility];  Service: General;  Laterality: N/A;  . INTRAUTERINE DEVICE INSERTION  2006   minera  . IR FLUORO GUIDE PORT INSERTION RIGHT  08/25/2017  . IR US GUIDE VASC ACCESS RIGHT  08/25/2017  . KNEE ARTHROSCOPY Left 08-27-13   torn meniscus repair left   . LAPAROSCOPIC LOW ANTERIOR RESECTION N/A 09/02/2013   Procedure: LAPAROSCOPIC LOW ANTERIOR RESECTION;  Surgeon: [name], MD;  [facility];  Service: General;  Laterality: N/A;  . LAPAROTOMY N/A 09/13/2013   Procedure: EXPLORATORY LAPAROTOMY / [place] SIGMOIDOSCOPY/  DRAINAGE OF PELVIC ABSCESS WITH  DIVERTING  LOOP  ILEOSTOMY;  Surgeon: [name], MD;  [facility];  Service: General;  Laterality: N/A;  . LYSIS OF ADHESION  05/02/2014   Procedure: LYSIS OF ADHESION;  Surgeon: [name], MD;  [facility];  Service: General;;  . PORT-A-CATH REMOVAL Left 06/09/2014   Procedure: REMOVAL PORT-A-CATH  (MINOR PROCEDURE) ;  Surgeon: [name], MD;  Location: [place];  Service: General;  Laterality: Left;  . PORTACATH PLACEMENT Left 10/11/2013   Procedure: INSERTION PORT-A-CATH;  Surgeon: [name],  MD;  [facility];  Service: General;  Laterality: Left;  . REFRACTIVE SURGERY      There were no vitals filed for this visit.  Subjective Assessment - 07/16/18 1105    Subjective  Doing well.  I dont' think it does any good     Pertinent History  Rectal cancer, clinical stage III (T3 N1) status post biopsy of a rectal mass on 05/24/2013 confirming adenocarcinoma. Radiation and Xeloda 06/18/13-07/26/13.  Low anterior resection 09/02/13 7/11 lymph nodes positive. FOLFOX chemotherapy 10/15/13-02/11/14. 07/05/17 new paraaortic, right common iliac and right external iliac lymphadenopathy. Biopsy of Left supraclavicular node positive for metastatic adenocarcinoma. 12 cycles of FOLFIRI 08/28/17 to 02/20/18. 05/10/18 left parietal metastasis.  05/31/18 brain mass resection. History of DVT 2015 in Left arm. Left nephrectomy 04/28/2017 due to nerve damage from surgery and chronic UTIs    Patient Stated Goals  see if the face can go down the wedding     Currently in Pain?  No/denies                       [facility] Adult PT Treatment/Exercise - 07/16/18 0001      Manual Therapy   Manual Lymphatic Drainage (MLD)  in supine HOB elevated, superficial abdominal work, deep breathing, bil axilary nodes, avoided supraclavicular nodes due to lymphoma status, cervical nodes, pre and retroauricular nodes, then work on the face chin and cheeks directing fluid towards CLN and reversing steps towards axillary nodes                  PT Long Term Goals - 07/09/18 1356      PT LONG TERM GOAL #1   Title  Pt will decrease facial swelling for son's wedding in 2 weeks with use of in clinic and self MLD     Time  2    Period  Weeks    Status  New    Target Date  07/23/18      PT LONG TERM GOAL #2   Title  Pt will be evaluated for Lt LE lymphedema with treatment as indicated    Time  4    Period  Weeks    Status  New    Target Date  08/06/18            Plan - 07/16/18 1145    Clinical  Impression Statement  Pt reports no significant difference with MLD but would like to do her last session before the wedding.  Chemo starts again Monday.     PT Treatment/Interventions  ADLs/Self Care Home Management;Therapeutic exercise;Manual lymph drainage;Compression bandaging;Manual techniques    PT Next Visit Plan  continue facial MLD no supraclavicular nodes or deep abdominals teach MLD, script back?     Consulted and Agree with Plan of  Care  Patient       Patient will benefit from skilled therapeutic intervention in order to improve the following deficits and impairments:     Visit Diagnosis: Localized edema  Other disturbances of skin sensation  Lymphedema, not elsewhere classified     Problem List Patient Active Problem List   Diagnosis Date Noted  . Metastasis to brain ([place]) 05/31/2018  . 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 Port-A-Cath in place 09/11/2017  . History of ileostomy 05/02/2014  . DVT (deep venous thrombosis) ([place]) 01/14/2014  . Urinary tract infection 01/13/2014  . Large bowel perforation ([place]) 09/13/2013  . Rectal cancer, 12 - 15 cm from anal verge 05/29/2013  . Herniation of cervical intervertebral disc with radiculopathy 01/03/2012    Shan Levans, PT 07/16/2018, 11:48 AM  [place], [place], [address] Phone: [phone]   Fax:  [phone]  Name: [name] MRN: [id number] Date of Birth: [birth year]

## 2018-07-17 ENCOUNTER — Other Ambulatory Visit: Payer: Self-pay | Admitting: Radiation Therapy

## 2018-07-17 DIAGNOSIS — C7949 Secondary malignant neoplasm of other parts of nervous system: Principal | ICD-10-CM

## 2018-07-17 DIAGNOSIS — C7931 Secondary malignant neoplasm of brain: Secondary | ICD-10-CM

## 2018-07-17 NOTE — Addendum Note (Signed)
Addended by: Pincus Large on: 07/17/2018 03:08 PM   Modules accepted: Orders

## 2018-07-18 ENCOUNTER — Ambulatory Visit: Payer: 59 | Admitting: Rehabilitation

## 2018-07-18 ENCOUNTER — Encounter: Payer: Self-pay | Admitting: Rehabilitation

## 2018-07-18 DIAGNOSIS — R600 Localized edema: Secondary | ICD-10-CM | POA: Diagnosis not present

## 2018-07-18 DIAGNOSIS — R208 Other disturbances of skin sensation: Secondary | ICD-10-CM

## 2018-07-18 NOTE — Therapy (Addendum)
[place], [place], [address] Phone: [phone]   Fax:  [phone]  Physical Therapy Treatment  Patient Details  Name: [name] MRN: [id number] Date of Birth: May 29, 1961 Referring Provider (PT): Shona Simpson PA-C   Encounter Date: 07/18/2018  PT End of Session - 07/18/18 1020    Visit Number  4    Number of Visits  10    Date for PT Re-Evaluation  08/10/18    Authorization Type  100 combined limit    PT Start Time  1021    PT Stop Time  1100    PT Time Calculation (min)  39 min    Activity Tolerance  Patient tolerated treatment well    Behavior During Therapy  [facility] for tasks assessed/performed       Past Medical History:  Diagnosis Date  . Anxiety   . Arthritis 08-27-13   at present has ruptured disc- lower back-not an issue now  . Blood infection ([place])    E Coli after UTI   . Colon cancer ([place]) 08-27-13   radiation /chemo -last ended 4 weeks(Dr. Benay Spice)  . DVT (deep venous thrombosis) ([place])    left upper extremity currently has stopped xarelto   . History of radiation therapy 06/18/13-07/26/13   rectal 50.4Gy total dose  . Hypertension    toxemia with pregnancy 1990  . IUD (intrauterine device) in place 08-27-13   Mirena Implant inplace  . Nerve damage    numbness in pelvic area from rectal rection straining to empty bladder   . PONV (postoperative nausea and vomiting)    scopalamine patch helped  . Stress incontinence   . UTI (lower urinary tract infection)    hx of     Past Surgical History:  Procedure Laterality Date  . APPENDECTOMY    . APPLICATION OF CRANIAL NAVIGATION N/A 05/31/2018   Procedure: APPLICATION OF CRANIAL NAVIGATION;  Surgeon: Erline Levine, MD;  Location: [place];  Service: Neurosurgery;  Laterality: N/A;  . BACK SURGERY    . bladder study      pressure readings had approx 1.5 months ago   . BREAST SURGERY     reduction surgery  . CERVICAL DISC  ARTHROPLASTY  01/03/2012   Procedure: CERVICAL ANTERIOR DISC ARTHROPLASTY;  Surgeon: Charlie Pitter, MD;  Location: [place] [facility];  Service: Neurosurgery;  Laterality: N/A;  Cervical Anterior Disc Arthorplasty Five-Six  . CESAREAN SECTION     x2  . CRANIOTOMY Left 05/31/2018   Procedure: Left Parietal craniotomy for tumor excision with brainlab;  Surgeon: Erline Levine, MD;  Location: [place];  Service: Neurosurgery;  Laterality: Left;  Left Parietal craniotomy for tumor excision with brainlab  . EUS N/A 05/31/2013   Procedure: LOWER ENDOSCOPIC ULTRASOUND (EUS);  Surgeon: Beryle Beams, MD;  [facility];  Service: Endoscopy;  Laterality: N/A;  . ILEO LOOP COLOSTOMY CLOSURE N/A 05/02/2014   Procedure: LAPAROSCOPIC ASSISTED REVERSAL ILEOSTOMY AND LYSIS OF ADHESIONS;  Surgeon: [name], MD;  [facility];  Service: General;  Laterality: N/A;  . INTRAUTERINE DEVICE INSERTION  2006   minera  . IR FLUORO GUIDE PORT INSERTION RIGHT  08/25/2017  . IR US GUIDE VASC ACCESS RIGHT  08/25/2017  . KNEE ARTHROSCOPY Left 08-27-13   torn meniscus repair left   . LAPAROSCOPIC LOW ANTERIOR RESECTION N/A 09/02/2013   Procedure: LAPAROSCOPIC LOW ANTERIOR RESECTION;  Surgeon: [name], MD;  [facility];  Service: General;  Laterality: N/A;  . LAPAROTOMY N/A 09/13/2013   Procedure: EXPLORATORY LAPAROTOMY / [place] SIGMOIDOSCOPY/  DRAINAGE OF PELVIC ABSCESS WITH  DIVERTING  LOOP  ILEOSTOMY;  Surgeon: [name], MD;  [facility];  Service: General;  Laterality: N/A;  . LYSIS OF ADHESION  05/02/2014   Procedure: LYSIS OF ADHESION;  Surgeon: [name], MD;  [facility];  Service: General;;  . PORT-A-CATH REMOVAL Left 06/09/2014   Procedure: REMOVAL PORT-A-CATH  (MINOR PROCEDURE) ;  Surgeon: [name], MD;  Location: [place];  Service: General;  Laterality: Left;  . PORTACATH PLACEMENT Left 10/11/2013   Procedure: INSERTION PORT-A-CATH;  Surgeon: [name],  MD;  [facility];  Service: General;  Laterality: Left;  . REFRACTIVE SURGERY      There were no vitals filed for this visit.  Subjective Assessment - 07/18/18 1018    Subjective  My company let me go for the end of the month..  I will still have LTD     Pertinent History  Rectal cancer, clinical stage III (T3 N1) status post biopsy of a rectal mass on 05/24/2013 confirming adenocarcinoma. Radiation and Xeloda 06/18/13-07/26/13.  Low anterior resection 09/02/13 7/11 lymph nodes positive. FOLFOX chemotherapy 10/15/13-02/11/14. 07/05/17 new paraaortic, right common iliac and right external iliac lymphadenopathy. Biopsy of Left supraclavicular node positive for metastatic adenocarcinoma. 12 cycles of FOLFIRI 08/28/17 to 02/20/18. 05/10/18 left parietal metastasis.  05/31/18 brain mass resection. History of DVT 2015 in Left arm. Left nephrectomy 04/28/2017 due to nerve damage from surgery and chronic UTIs    Patient Stated Goals  see if the face can go down the wedding     Currently in Pain?  No/denies                       [facility] Adult PT Treatment/Exercise - 07/18/18 0001      Manual Therapy   Manual Lymphatic Drainage (MLD)  in supine HOB elevated, superficial abdominal work, deep breathing, bil axilary nodes, avoided supraclavicular nodes due to lymphoma status, cervical nodes, pre and retroauricular nodes, then work on the face chin and cheeks directing fluid towards CLN and reversing steps towards axillary nodes                  PT Long Term Goals - 07/18/18 1135      PT LONG TERM GOAL #1   Title  Pt will decrease facial swelling for son's wedding in 2 weeks with use of in clinic and self MLD     Status  Partially Met      PT LONG TERM GOAL #2   Title  Pt will be evaluated for Lt LE lymphedema with treatment as indicated    Status  On-going            Plan - 07/18/18 1134    Clinical Impression Statement  Pts last day of facial MLD before the wedding.   Will take a break to switch insurance, start Chemo, and then return for leg assessment     PT Frequency  2x / week    PT Duration  --   5weeks   PT Treatment/Interventions  ADLs/Self Care Home Management;Therapeutic exercise;Manual lymph drainage;Compression bandaging;Manual techniques    PT Next Visit Plan  assess LE, insurance recheck     Consulted and Agree with Plan of Care  Patient       Patient will benefit from skilled therapeutic intervention in order to  improve the following deficits and impairments:  Decreased skin integrity, Decreased knowledge of precautions, Decreased knowledge of use of DME, Increased edema  Visit Diagnosis: Localized edema  Other disturbances of skin sensation     Problem List Patient Active Problem List   Diagnosis Date Noted  . Metastasis to brain ([place]) 05/31/2018  . 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 Port-A-Cath in place 09/11/2017  . History of ileostomy 05/02/2014  . DVT (deep venous thrombosis) ([place]) 01/14/2014  . Urinary tract infection 01/13/2014  . Large bowel perforation ([place]) 09/13/2013  . Rectal cancer, 12 - 15 cm from anal verge 05/29/2013  . Herniation of cervical intervertebral disc with radiculopathy 01/03/2012    Shan Levans, PT 07/18/2018, 11:36 AM  [place], [place], [address] Phone: [phone]   Fax:  [phone]  Name: [name] MRN: [id number] Date of Birth: 1961-01-01   PHYSICAL THERAPY DISCHARGE SUMMARY  Visits from Start of Care: 4  Current functional level related to goals / functional outcomes: Pt attempted a few sessions of facial MLD to see if any swelling would decrease for the wedding.  Swelling did not decrease but did decrease with stopping of steroids as patient was educated was most likely the cause.  Pt was going to return after the wedding for the LE swelling but did not  return and per chart was having a lot of side effects with current infusions.  Will restart therapy if pt returns.    Remaining deficits: LE edema    Education / Equipment: Self facial MLD and use of ice roller Plan: Patient agrees to discharge.  Patient goals were partially met. Patient is being discharged due to not returning since the last visit.  ?????     Shan Levans, PT

## 2018-07-21 ENCOUNTER — Other Ambulatory Visit: Payer: Self-pay | Admitting: Oncology

## 2018-07-23 ENCOUNTER — Inpatient Hospital Stay: Payer: 59

## 2018-07-23 ENCOUNTER — Other Ambulatory Visit: Payer: Self-pay | Admitting: *Deleted

## 2018-07-23 ENCOUNTER — Inpatient Hospital Stay: Payer: 59 | Attending: Nurse Practitioner

## 2018-07-23 ENCOUNTER — Inpatient Hospital Stay (HOSPITAL_BASED_OUTPATIENT_CLINIC_OR_DEPARTMENT_OTHER): Payer: 59 | Admitting: Oncology

## 2018-07-23 VITALS — BP 99/67 | HR 80 | Temp 98.2°F | Resp 18 | Ht 64.0 in | Wt 159.1 lb

## 2018-07-23 DIAGNOSIS — Z95828 Presence of other vascular implants and grafts: Secondary | ICD-10-CM

## 2018-07-23 DIAGNOSIS — D6959 Other secondary thrombocytopenia: Secondary | ICD-10-CM | POA: Diagnosis not present

## 2018-07-23 DIAGNOSIS — C20 Malignant neoplasm of rectum: Secondary | ICD-10-CM

## 2018-07-23 DIAGNOSIS — R110 Nausea: Secondary | ICD-10-CM | POA: Insufficient documentation

## 2018-07-23 DIAGNOSIS — I871 Compression of vein: Secondary | ICD-10-CM | POA: Diagnosis not present

## 2018-07-23 DIAGNOSIS — C770 Secondary and unspecified malignant neoplasm of lymph nodes of head, face and neck: Secondary | ICD-10-CM

## 2018-07-23 DIAGNOSIS — Z5111 Encounter for antineoplastic chemotherapy: Secondary | ICD-10-CM | POA: Diagnosis not present

## 2018-07-23 DIAGNOSIS — C7931 Secondary malignant neoplasm of brain: Secondary | ICD-10-CM

## 2018-07-23 DIAGNOSIS — Z7901 Long term (current) use of anticoagulants: Secondary | ICD-10-CM

## 2018-07-23 DIAGNOSIS — Z5189 Encounter for other specified aftercare: Secondary | ICD-10-CM | POA: Diagnosis not present

## 2018-07-23 DIAGNOSIS — N390 Urinary tract infection, site not specified: Secondary | ICD-10-CM

## 2018-07-23 DIAGNOSIS — Z452 Encounter for adjustment and management of vascular access device: Secondary | ICD-10-CM | POA: Insufficient documentation

## 2018-07-23 DIAGNOSIS — R319 Hematuria, unspecified: Principal | ICD-10-CM

## 2018-07-23 LAB — CBC WITH DIFFERENTIAL (CANCER CENTER ONLY)
Abs Immature Granulocytes: 0.06 10*3/uL (ref 0.00–0.07)
Basophils Absolute: 0 10*3/uL (ref 0.0–0.1)
Basophils Relative: 1 %
EOS ABS: 0.2 10*3/uL (ref 0.0–0.5)
Eosinophils Relative: 4 %
HCT: 32.6 % — ABNORMAL LOW (ref 36.0–46.0)
Hemoglobin: 10.5 g/dL — ABNORMAL LOW (ref 12.0–15.0)
IMMATURE GRANULOCYTES: 2 %
Lymphocytes Relative: 14 %
Lymphs Abs: 0.6 10*3/uL — ABNORMAL LOW (ref 0.7–4.0)
MCH: 29.8 pg (ref 26.0–34.0)
MCHC: 32.2 g/dL (ref 30.0–36.0)
MCV: 92.6 fL (ref 80.0–100.0)
Monocytes Absolute: 0.4 10*3/uL (ref 0.1–1.0)
Monocytes Relative: 10 %
Neutro Abs: 2.9 10*3/uL (ref 1.7–7.7)
Neutrophils Relative %: 69 %
Platelet Count: 224 10*3/uL (ref 150–400)
RBC: 3.52 MIL/uL — ABNORMAL LOW (ref 3.87–5.11)
RDW: 17.9 % — ABNORMAL HIGH (ref 11.5–15.5)
WBC Count: 4.1 10*3/uL (ref 4.0–10.5)
nRBC: 0.5 % — ABNORMAL HIGH (ref 0.0–0.2)

## 2018-07-23 LAB — CMP (CANCER CENTER ONLY)
ALK PHOS: 72 U/L (ref 38–126)
ALT: 13 U/L (ref 0–44)
AST: 14 U/L — ABNORMAL LOW (ref 15–41)
Albumin: 3.7 g/dL (ref 3.5–5.0)
Anion gap: 8 (ref 5–15)
BUN: 13 mg/dL (ref 6–20)
CO2: 25 mmol/L (ref 22–32)
Calcium: 9.5 mg/dL (ref 8.9–10.3)
Chloride: 107 mmol/L (ref 98–111)
Creatinine: 0.98 mg/dL (ref 0.44–1.00)
GFR, Est AFR Am: >60 mL/min (ref >60)
GFR, Estimated: >60 mL/min (ref >60)
Glucose, Bld: 94 mg/dL (ref 70–99)
Potassium: 3.8 mmol/L (ref 3.5–5.1)
Sodium: 140 mmol/L (ref 135–145)
Total Bilirubin: 0.4 mg/dL (ref 0.3–1.2)
Total Protein: 7.1 g/dL (ref 6.5–8.1)

## 2018-07-23 LAB — CEA (IN HOUSE-CHCC): CEA (CHCC-In House): 13.05 ng/mL — ABNORMAL HIGH (ref 0.00–5.00)

## 2018-07-23 MED ORDER — DEXTROSE 5 % IV SOLN
Freq: Once | INTRAVENOUS | Status: AC
  Administered 2018-07-23: 10:00:00 via INTRAVENOUS
  Filled 2018-07-23: qty 250

## 2018-07-23 MED ORDER — ALPRAZOLAM 1 MG PO TABS: 1.0000 mg | ORAL_TABLET | Freq: Every evening | ORAL | 0 refills | Status: DC | PRN

## 2018-07-23 MED ORDER — PALONOSETRON HCL INJECTION 0.25 MG/5ML
INTRAVENOUS | Status: AC
  Filled 2018-07-23: qty 5

## 2018-07-23 MED ORDER — PROCHLORPERAZINE MALEATE 10 MG PO TABS: 10.0000 mg | ORAL_TABLET | Freq: Four times a day (QID) | ORAL | 2 refills | Status: AC | PRN

## 2018-07-23 MED ORDER — DEXAMETHASONE SODIUM PHOSPHATE 10 MG/ML IJ SOLN
10.0000 mg | Freq: Once | INTRAMUSCULAR | Status: AC
  Administered 2018-07-23: 10 mg via INTRAVENOUS

## 2018-07-23 MED ORDER — FLUOROURACIL CHEMO INJECTION 2.5 GM/50ML
400.0000 mg/m2 | Freq: Once | INTRAVENOUS | Status: AC
  Administered 2018-07-23: 700 mg via INTRAVENOUS
  Filled 2018-07-23: qty 14

## 2018-07-23 MED ORDER — SODIUM CHLORIDE 0.9% FLUSH
10.0000 mL | INTRAVENOUS | Status: DC | PRN
  Administered 2018-07-23: 10 mL
  Filled 2018-07-23: qty 10

## 2018-07-23 MED ORDER — DEXAMETHASONE SODIUM PHOSPHATE 10 MG/ML IJ SOLN
INTRAMUSCULAR | Status: AC
  Filled 2018-07-23: qty 1

## 2018-07-23 MED ORDER — PALONOSETRON HCL INJECTION 0.25 MG/5ML
0.2500 mg | Freq: Once | INTRAVENOUS | Status: AC
  Administered 2018-07-23: 0.25 mg via INTRAVENOUS

## 2018-07-23 MED ORDER — LEUCOVORIN CALCIUM INJECTION 350 MG
400.0000 mg/m2 | Freq: Once | INTRAVENOUS | Status: AC
  Administered 2018-07-23: 720 mg via INTRAVENOUS
  Filled 2018-07-23: qty 36

## 2018-07-23 MED ORDER — SODIUM CHLORIDE 0.9 % IV SOLN
2400.0000 mg/m2 | INTRAVENOUS | Status: DC
  Administered 2018-07-23: 4300 mg via INTRAVENOUS
  Filled 2018-07-23: qty 86

## 2018-07-23 MED ORDER — OXALIPLATIN CHEMO INJECTION 100 MG/20ML
83.0000 mg/m2 | Freq: Once | INTRAVENOUS | Status: AC
  Administered 2018-07-23: 150 mg via INTRAVENOUS
  Filled 2018-07-23: qty 20

## 2018-07-23 NOTE — Progress Notes (Signed)
[place] OFFICE PROGRESS NOTE   Diagnosis: Rectal cancer  INTERVAL HISTORY:   [name] turns as scheduled.  She completed another cycle of FOLFOX on 06/28/2018.  She continues to note a mass at the left lower neck.  She has persistent swelling of the right leg.  The right leg swelling was worse over the weekend after prolonged ambulation with her daughter's wedding. No mouth sores, diarrhea, or emesis.  She has nausea following chemotherapy.  Mild numbness in the extremities.  Objective:  Vital signs in last 24 hours:  Blood pressure 99/67, pulse 80, temperature 98.2 F (36.8 C), temperature source Oral, resp. rate 18, height _0  (1.626 m), weight 159 lb 1.6 oz (72.2 kg), SpO2 100 %.    HEENT: No thrush or ulcers Lymphatics: Approximate 3-4 cm mass in the medial left lower neck Resp: Lungs clear bilaterally Cardio: Regular rate and rhythm GI: No hepatomegaly, left lower quadrant colostomy, nontender Vascular: Trace edema throughout the right leg   Portacath/PICC-without erythema  Lab Results:  Lab Results  Component Value Date   WBC 4.1 07/23/2018   HGB 10.5 (L) 07/23/2018   HCT 32.6 (L) 07/23/2018   MCV 92.6 07/23/2018   PLT 224 07/23/2018   NEUTROABS 2.9 07/23/2018    CMP  Lab Results  Component Value Date   NA 140 07/23/2018   K 3.8 07/23/2018   CL 107 07/23/2018   CO2 25 07/23/2018   GLUCOSE 94 07/23/2018   BUN 13 07/23/2018   CREATININE 0.98 07/23/2018   CALCIUM 9.5 07/23/2018   PROT 7.1 07/23/2018   ALBUMIN 3.7 07/23/2018   AST 14 (L) 07/23/2018   ALT 13 07/23/2018   ALKPHOS 72 07/23/2018   BILITOT 0.4 07/23/2018   GFRNONAA >60 07/23/2018   GFRAA >60 07/23/2018    Lab Results  Component Value Date   CEA1 10.19 (H) 06/28/2018    Lab Results  Component Value Date   INR 0.90 05/29/2018    Imaging:  No results found.  Medications: I have reviewed the patient's current medications.   Assessment/Plan: 1. Rectal  cancer, clinical stage III (T3 N1) status post biopsy of a rectal mass on 05/24/2013 confirming adenocarcinoma. Endoscopic ultrasound 05/31/2013 measured the mass at 11 cm from the anal verge, uT3uN1.  Initiation of radiation and concurrent Xeloda 06/18/2013; completion 07/26/2013.   Status post low anterior resection 09/02/2013. Invasive adenocarcinoma, 3.7 cm, negative margins; metastatic carcinoma in 7 of 11 lymph nodes; extensive lymph vascular involvement by tumor.MSI-stable, no loss of mismatch repair protein expression  Cycle 1 adjuvant FOLFOX 10/15/2013.   Cycle 2 adjuvant FOLFOX 10/29/2013.   Cycle 3 adjuvant FOLFOX 11/14/2013.   Cycle 4 adjuvant FOLFOX 11/27/2013.   Cycle 5 adjuvant FOLFOX 12/10/2013   Cycle 6 adjuvant FOLFOX 12/24/2013.   Cycle 7 adjuvant FOLFOX 01/14/2014.   Cycle 8 adjuvant FOLFOX 01/28/2014   Cycle 9 adjuvant FOLFOX 02/11/2014 (oxaliplatin dose reduced secondary to thrombocytopenia)   Surveillance CT scans 06/02/2014 with no evidence of recurrent rectal cancer  surveillance CT scans 06/16/2015 with no evidence of recurrent rectal cancer, 10 mm right external iliac node , increased amount of loculated air associated with the presacral soft tissue thickening , new left hydronephrosis  Surveillance CT scans 05/06/2016-negative for recurrent rectal cancer, decreased right external iliac node, persistent left hydronephrosis  CT abdomen/pelvis 07/05/2017-new left para-aortic, aortocaval, right common iliac and right external iliac lymphadenopathy.  CT neck/chest 07/13/2017-enlarged left supraclavicular nodes, new paratracheal nodes  Ultrasound-guided biopsy of  a left supraclavicular lymph node 07/25/2017-metastatic adenocarcinoma consistent with colorectal cancer,MSI-stable, tumor mutation burden-10, K-ras S51mtation  Cycle 1 FOLFIRI 08/28/2017  Cycle 2 FOLFIRI 09/11/2017  Cycle 3 FOLFIRI 09/25/2017  Cycle 4 FOLFIRI 10/09/2017  Cycle 5  FOLFIRI 10/23/2017  CTs 11/14/2017-slight decrease in left supraclavicular adenopathy, slight increase and decrease in mediastinal/retroperitoneal/pelvic nodes, no new disease  Cycle 6 FOLFIRI 11/14/2017  Cycle 7 FOLFIRI 11/27/2017  Cycle 8 FOLFIRI 12/11/2017  Cycle 9 FOLFIRI 01/03/2018  Cycle 10 FOLFIRI 01/18/2018  Restaging CTs 01/29/2018-stable thoracic adenopathywith mildly improved abdominal adenopathy. Similar appearance of presacral soft tissue density. Suspected small fistula from the posterior margin of the rectum or anastomosis to the presacral region.  Cycle 11 FOLFIRI 01/31/2018  Cycle 12 FOLFIRI 02/20/2018  CT pelvis 03/16/2018-mild progression of pelvic lymphadenopathy with compression of the right iliac vein by an enlarged right obturator node  Cycle 1 FOLFOX 04/23/2018  CT emergency department 04/24/2018-slight interval increase in retroperitoneal adenopathy and right pelvic sidewall mass.  Cycle 2 FOLFOX 05/10/2018  Brain CT 05/10/2018- left parietal metastasis with extensive vasogenic edema  Brain MRI 05/22/2018- 3 cm solitary left parietal metastasis with extensive vasogenic edema  SRS left parietal metastasis 05/30/2018, brain mass resection 05/31/2018-metastatic adenocarcinoma consistent with primary rectal adenocarcinoma  Cycle 3 FOLFOX 06/07/2018  Cycle 4 FOLFOX 06/28/2018, G-CSF support added  5 FOLFOX 07/23/2018 2. Post colonoscopy bowel perforation confirmed on abdomen CT 05/28/2013. 3. Single indeterminate liver lesion on the abdomen CT 05/28/2013. MRI on 06/18/2013 confirmed 2 tiny liver lesions, indeterminate-favored to be benign cysts. 4. History of cervical and lumbar disc disease.  5. History of HELLP syndrome with first pregnancy.  6. Delayed nausea following cycle 1 FOLFOX. She receives Aloxi and Emend. She continues to experience delayed nausea. Prophylactic Decadron added with cycle 4. 7. History of pain with bowel movements. 8. Anastomotic leak on  CT 09/13/2013. Status post exploratory laparotomy with drainage of a pelvic abscess, diverting loop ileostomy. Drainage catheter removed 10/23/2013.  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 Perineal numbness, urinary retention. The urinary retention has improved. 10. Perineal pain-improved. 11. Neutropenia secondary chemotherapy-she received Neulasta. 12. Urinary tract infection, Escherichia coli-11/03/2013. 13. Venous engorgement at the left anterior chest and left arm with swelling of the left arm and hand. Venous Doppler 11/14/2013 positive for DVT. She began Lovenox 11/14/2013 and was switched to xarelto 12/10/2013. Xarelto discontinued early January 2016. 14. Fever 11/20/2013. Blood and urine cultures negative. The fever resolved within 24 hours. She continued to have a single episode of fever following chemotherapy 15. History of a herpetic lip lesion 16. 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 17. Escherichia coli bacteremia, urinary source, 01/14/2014. She completed a 10 day course of ceftriaxone. 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 Anxiety/depression. 20. Port-A-Cath removal 06/09/2014. 21. Escherichia coli urinary tract infection 06/24/2014. She completed a course of ciprofloxacin. 22. frequent bowel movements following surgery/radiation -status post placement of a sacral stimulator 11/21/2014 23. CT 06/16/2015 with new left hydronephrosis status post cystoscopy with placement of a double-J stent 07/17/2015. No tumors or stones were seen in the bladder. Both ureteral orifices were unremarkable. On the left side retrograde study was performed. The distal ureter was narrowed and then a little bit more dilated proximally with hydronephrosis noted. There was no evidence of intramural tumor but the area did seem a bit  strictured.  Stent removed, persistent hydronephrosis, followed by Dr. EAmalia Hailey  CT 05/06/2016-increased left hydronephrosis  Ultrasound 09/09/2016-stable left hydroureteronephrosis  Left nephrectomy 04/28/2017  24.Compression of right iliac vein on CT 03/16/2018-placed on Xarelto anticoagulation, persistent swelling of the right lower extremity, negative Doppler 03/12/2018 and 06/19/2018      Disposition: Her overall status appears unchanged.  She will complete another cycle of chemotherapy today.  She will undergo restaging CTs after this cycle.  Ms. Pharr return for an office visit in 2 weeks.  She recently lost her job related insurance.  She is enrolled in a Medicaid supplement.  We will refer her to the lymphedema clinic when the insurance is active.  Betsy Coder, MD  07/23/2018  9:41 AM

## 2018-07-23 NOTE — Patient Instructions (Signed)
[facility] Discharge Instructions for Patients Receiving Chemotherapy  Today you received the following chemotherapy agents: Oxaliplatin, leucovorin, and 5FU  To help prevent nausea and vomiting after your treatment, we encourage you to take your nausea medication as directed If you develop nausea and vomiting that is not controlled by your nausea medication, call the clinic.   BELOW ARE SYMPTOMS THAT SHOULD BE REPORTED IMMEDIATELY:  *FEVER GREATER THAN 100.5 F  *CHILLS WITH OR WITHOUT FEVER  NAUSEA AND VOMITING THAT IS NOT CONTROLLED WITH YOUR NAUSEA MEDICATION  *UNUSUAL SHORTNESS OF BREATH  *UNUSUAL BRUISING OR BLEEDING  TENDERNESS IN MOUTH AND THROAT WITH OR WITHOUT PRESENCE OF ULCERS  *URINARY PROBLEMS  *BOWEL PROBLEMS  UNUSUAL RASH Items with * indicate a potential emergency and should be followed up as soon as possible.  Feel free to call the clinic should you have any questions or concerns. The clinic phone number is [phone].  Please show the CHEMO ALERT CARD at check-in to the Emergency Department and triage nurse.   

## 2018-07-25 ENCOUNTER — Inpatient Hospital Stay: Payer: 59

## 2018-07-25 ENCOUNTER — Telehealth: Payer: Self-pay | Admitting: Oncology

## 2018-07-25 VITALS — BP 138/79 | HR 94 | Temp 98.1°F | Resp 18

## 2018-07-25 DIAGNOSIS — C20 Malignant neoplasm of rectum: Secondary | ICD-10-CM

## 2018-07-25 DIAGNOSIS — Z5111 Encounter for antineoplastic chemotherapy: Secondary | ICD-10-CM | POA: Diagnosis not present

## 2018-07-25 MED ORDER — SODIUM CHLORIDE 0.9% FLUSH
10.0000 mL | INTRAVENOUS | Status: DC | PRN
  Administered 2018-07-25: 10 mL
  Filled 2018-07-25: qty 10

## 2018-07-25 MED ORDER — PEGFILGRASTIM-CBQV 6 MG/0.6ML ~~LOC~~ SOSY
PREFILLED_SYRINGE | SUBCUTANEOUS | Status: AC
  Filled 2018-07-25: qty 0.6

## 2018-07-25 MED ORDER — HEPARIN SOD (PORK) LOCK FLUSH 100 UNIT/ML IV SOLN
500.0000 [IU] | Freq: Once | INTRAVENOUS | Status: AC | PRN
  Administered 2018-07-25: 500 [IU]
  Filled 2018-07-25: qty 5

## 2018-07-25 MED ORDER — PEGFILGRASTIM-CBQV 6 MG/0.6ML ~~LOC~~ SOSY
6.0000 mg | PREFILLED_SYRINGE | Freq: Once | SUBCUTANEOUS | Status: AC
  Administered 2018-07-25: 6 mg via SUBCUTANEOUS

## 2018-07-25 NOTE — Telephone Encounter (Signed)
Scheduled appt per 2/03 los - unabel tot schedule on 2/17 due to MD availability - scheduled f/u for 2/18 - waiting on chemo to be added due to cap- will call pt when everything is scheduled.

## 2018-07-26 ENCOUNTER — Telehealth: Payer: Self-pay

## 2018-07-26 NOTE — Telephone Encounter (Signed)
Returned TC [phone]) to pt regarding Decadron amount and dosage. Spoke with Dr. Benay Spice who stated for Pt to take 4mg  ONCE a day for THREE days after chemo. Pt verbalized understanding. No further problems or concerns at this time.

## 2018-08-02 ENCOUNTER — Ambulatory Visit (HOSPITAL_COMMUNITY): Payer: 59

## 2018-08-02 ENCOUNTER — Ambulatory Visit (HOSPITAL_COMMUNITY)
Admission: RE | Admit: 2018-08-02 | Discharge: 2018-08-02 | Disposition: A | Discharge status: Home / Self Care | Payer: 59 | Source: Ambulatory Visit | Attending: Oncology | Admitting: Oncology

## 2018-08-02 DIAGNOSIS — C20 Malignant neoplasm of rectum: Secondary | ICD-10-CM | POA: Insufficient documentation

## 2018-08-02 MED ORDER — SODIUM CHLORIDE (PF) 0.9 % IJ SOLN
INTRAMUSCULAR | Status: AC
  Filled 2018-08-02: qty 50

## 2018-08-02 MED ORDER — IOHEXOL 300 MG/ML  SOLN
100.0000 mL | Freq: Once | INTRAMUSCULAR | Status: AC | PRN
  Administered 2018-08-02: 100 mL via INTRAVENOUS

## 2018-08-05 ENCOUNTER — Other Ambulatory Visit: Payer: Self-pay | Admitting: Oncology

## 2018-08-07 ENCOUNTER — Inpatient Hospital Stay: Payer: 59

## 2018-08-07 ENCOUNTER — Inpatient Hospital Stay (HOSPITAL_BASED_OUTPATIENT_CLINIC_OR_DEPARTMENT_OTHER): Payer: 59 | Admitting: Nurse Practitioner

## 2018-08-07 ENCOUNTER — Encounter: Payer: Self-pay | Admitting: Nurse Practitioner

## 2018-08-07 DIAGNOSIS — C770 Secondary and unspecified malignant neoplasm of lymph nodes of head, face and neck: Secondary | ICD-10-CM

## 2018-08-07 DIAGNOSIS — N390 Urinary tract infection, site not specified: Secondary | ICD-10-CM

## 2018-08-07 DIAGNOSIS — Z95828 Presence of other vascular implants and grafts: Secondary | ICD-10-CM

## 2018-08-07 DIAGNOSIS — C20 Malignant neoplasm of rectum: Secondary | ICD-10-CM

## 2018-08-07 DIAGNOSIS — R110 Nausea: Secondary | ICD-10-CM

## 2018-08-07 DIAGNOSIS — C7931 Secondary malignant neoplasm of brain: Secondary | ICD-10-CM

## 2018-08-07 DIAGNOSIS — R319 Hematuria, unspecified: Principal | ICD-10-CM

## 2018-08-07 DIAGNOSIS — Z5111 Encounter for antineoplastic chemotherapy: Secondary | ICD-10-CM | POA: Diagnosis not present

## 2018-08-07 DIAGNOSIS — I871 Compression of vein: Secondary | ICD-10-CM

## 2018-08-07 DIAGNOSIS — R112 Nausea with vomiting, unspecified: Secondary | ICD-10-CM

## 2018-08-07 DIAGNOSIS — Z7901 Long term (current) use of anticoagulants: Secondary | ICD-10-CM

## 2018-08-07 LAB — CBC WITH DIFFERENTIAL (CANCER CENTER ONLY)
Abs Immature Granulocytes: 0.34 10*3/uL — ABNORMAL HIGH (ref 0.00–0.07)
Basophils Absolute: 0.1 10*3/uL (ref 0.0–0.1)
Basophils Relative: 1 %
Eosinophils Absolute: 0.1 10*3/uL (ref 0.0–0.5)
Eosinophils Relative: 2 %
HCT: 35.6 % — ABNORMAL LOW (ref 36.0–46.0)
Hemoglobin: 11.7 g/dL — ABNORMAL LOW (ref 12.0–15.0)
Immature Granulocytes: 4 %
Lymphocytes Relative: 12 %
Lymphs Abs: 1 10*3/uL (ref 0.7–4.0)
MCH: 31.2 pg (ref 26.0–34.0)
MCHC: 32.9 g/dL (ref 30.0–36.0)
MCV: 94.9 fL (ref 80.0–100.0)
MONOS PCT: 6 %
Monocytes Absolute: 0.5 10*3/uL (ref 0.1–1.0)
Neutro Abs: 6.2 10*3/uL (ref 1.7–7.7)
Neutrophils Relative %: 75 %
Platelet Count: 134 10*3/uL — ABNORMAL LOW (ref 150–400)
RBC: 3.75 MIL/uL — ABNORMAL LOW (ref 3.87–5.11)
RDW: 17.6 % — ABNORMAL HIGH (ref 11.5–15.5)
WBC Count: 8.2 10*3/uL (ref 4.0–10.5)
nRBC: 0.5 % — ABNORMAL HIGH (ref 0.0–0.2)

## 2018-08-07 LAB — CMP (CANCER CENTER ONLY)
ALT: 22 U/L (ref 0–44)
AST: 23 U/L (ref 15–41)
Albumin: 4 g/dL (ref 3.5–5.0)
Alkaline Phosphatase: 115 U/L (ref 38–126)
Anion gap: 10 (ref 5–15)
BILIRUBIN TOTAL: 0.3 mg/dL (ref 0.3–1.2)
BUN: 17 mg/dL (ref 6–20)
CO2: 24 mmol/L (ref 22–32)
CREATININE: 1.09 mg/dL — AB (ref 0.44–1.00)
Calcium: 9.8 mg/dL (ref 8.9–10.3)
Chloride: 105 mmol/L (ref 98–111)
GFR, Est AFR Am: >60 mL/min (ref >60)
GFR, Estimated: 57 mL/min — ABNORMAL LOW (ref >60)
Glucose, Bld: 87 mg/dL (ref 70–99)
Potassium: 4.1 mmol/L (ref 3.5–5.1)
Sodium: 139 mmol/L (ref 135–145)
Total Protein: 7.8 g/dL (ref 6.5–8.1)

## 2018-08-07 LAB — CEA (IN HOUSE-CHCC): CEA (CHCC-In House): 13.44 ng/mL — ABNORMAL HIGH (ref 0.00–5.00)

## 2018-08-07 MED ORDER — LEUCOVORIN CALCIUM INJECTION 350 MG
400.0000 mg/m2 | Freq: Once | INTRAVENOUS | Status: AC
  Administered 2018-08-07: 720 mg via INTRAVENOUS
  Filled 2018-08-07: qty 36

## 2018-08-07 MED ORDER — SODIUM CHLORIDE 0.9% FLUSH
10.0000 mL | INTRAVENOUS | Status: DC | PRN
  Administered 2018-08-07: 10 mL
  Filled 2018-08-07: qty 10

## 2018-08-07 MED ORDER — SODIUM CHLORIDE 0.9 % IV SOLN
2400.0000 mg/m2 | INTRAVENOUS | Status: DC
  Administered 2018-08-07: 4300 mg via INTRAVENOUS
  Filled 2018-08-07: qty 86

## 2018-08-07 MED ORDER — PALONOSETRON HCL INJECTION 0.25 MG/5ML
INTRAVENOUS | Status: AC
  Filled 2018-08-07: qty 5

## 2018-08-07 MED ORDER — ONDANSETRON HCL 8 MG PO TABS: 8.0000 mg | ORAL_TABLET | Freq: Three times a day (TID) | ORAL | 0 refills | Status: AC | PRN

## 2018-08-07 MED ORDER — LORAZEPAM 0.5 MG PO TABS: 0.5000 mg | ORAL_TABLET | Freq: Three times a day (TID) | ORAL | 1 refills | Status: AC | PRN

## 2018-08-07 MED ORDER — DEXTROSE 5 % IV SOLN
Freq: Once | INTRAVENOUS | Status: AC
  Administered 2018-08-07: 14:00:00 via INTRAVENOUS
  Filled 2018-08-07: qty 250

## 2018-08-07 MED ORDER — FLUOROURACIL CHEMO INJECTION 2.5 GM/50ML
400.0000 mg/m2 | Freq: Once | INTRAVENOUS | Status: AC
  Administered 2018-08-07: 700 mg via INTRAVENOUS
  Filled 2018-08-07: qty 14

## 2018-08-07 MED ORDER — DEXAMETHASONE SODIUM PHOSPHATE 10 MG/ML IJ SOLN
INTRAMUSCULAR | Status: AC
  Filled 2018-08-07: qty 1

## 2018-08-07 MED ORDER — DEXTROSE 5 % IV SOLN
Freq: Once | INTRAVENOUS | Status: AC
  Administered 2018-08-07: 15:00:00 via INTRAVENOUS
  Filled 2018-08-07: qty 250

## 2018-08-07 MED ORDER — OXALIPLATIN CHEMO INJECTION 100 MG/20ML
84.0000 mg/m2 | Freq: Once | INTRAVENOUS | Status: AC
  Administered 2018-08-07: 150 mg via INTRAVENOUS
  Filled 2018-08-07: qty 20

## 2018-08-07 MED ORDER — PALONOSETRON HCL INJECTION 0.25 MG/5ML
0.2500 mg | Freq: Once | INTRAVENOUS | Status: AC
  Administered 2018-08-07: 0.25 mg via INTRAVENOUS

## 2018-08-07 MED ORDER — DEXAMETHASONE SODIUM PHOSPHATE 10 MG/ML IJ SOLN
10.0000 mg | Freq: Once | INTRAMUSCULAR | Status: AC
  Administered 2018-08-07: 10 mg via INTRAVENOUS

## 2018-08-07 NOTE — Progress Notes (Signed)
[place] OFFICE PROGRESS NOTE   Diagnosis: Rectal cancer  INTERVAL HISTORY:   [name] returns as scheduled.  She completed another cycle of FOLFOX 07/23/2018.  She noted onset of nausea around day 2 or 3.  She took a single dose of prophylactic dexamethasone.  She alternated Compazine, Zofran and lorazepam.  No mouth sores.  No diarrhea.  Neuropathy symptoms stable to slightly increased in the fingertips, toes and around her mouth.  Symptoms do not interfere with activity.  Objective:  Vital signs in last 24 hours:  Blood pressure 119/71, pulse 82, temperature 98.6 F (37 C), temperature source Oral, resp. rate 18, height _0  (1.626 m), weight 153 lb 1.6 oz (69.4 kg), SpO2 100 %.    HEENT: No thrush or ulcers. Lymphatics: 3 to 4 cm mass medial left lower neck. Resp: Lungs clear bilaterally. Cardio: Regular rate and rhythm. GI: Abdomen soft and nontender.  No hepatomegaly.  Vascular: Trace edema throughout the right leg. Neuro: Vibratory sense intact over the fingertips per tuning fork exam. Portacath without erythema.  Lab Results:  Lab Results  Component Value Date   WBC 8.2 08/07/2018   HGB 11.7 (L) 08/07/2018   HCT 35.6 (L) 08/07/2018   MCV 94.9 08/07/2018   PLT 134 (L) 08/07/2018   NEUTROABS 6.2 08/07/2018    Imaging:  No results found.  Medications: I have reviewed the patient's current medications.  Assessment/Plan: 1. Rectal cancer, clinical stage III (T3 N1) status post biopsy of a rectal mass on 05/24/2013 confirming adenocarcinoma. Endoscopic ultrasound 05/31/2013 measured the mass at 11 cm from the anal verge, uT3uN1.  Initiation of radiation and concurrent Xeloda 06/18/2013; completion 07/26/2013.   Status post low anterior resection 09/02/2013. Invasive adenocarcinoma, 3.7 cm, negative margins; metastatic carcinoma in 7 of 11 lymph nodes; extensive lymph vascular involvement by tumor.MSI-stable, no loss of mismatch repair  protein expression  Cycle 1 adjuvant FOLFOX 10/15/2013.   Cycle 2 adjuvant FOLFOX 10/29/2013.   Cycle 3 adjuvant FOLFOX 11/14/2013.   Cycle 4 adjuvant FOLFOX 11/27/2013.   Cycle 5 adjuvant FOLFOX 12/10/2013   Cycle 6 adjuvant FOLFOX 12/24/2013.   Cycle 7 adjuvant FOLFOX 01/14/2014.   Cycle 8 adjuvant FOLFOX 01/28/2014   Cycle 9 adjuvant FOLFOX 02/11/2014 (oxaliplatin dose reduced secondary to thrombocytopenia)   Surveillance CT scans 06/02/2014 with no evidence of recurrent rectal cancer  surveillance CT scans 06/16/2015 with no evidence of recurrent rectal cancer, 10 mm right external iliac node , increased amount of loculated air associated with the presacral soft tissue thickening , new left hydronephrosis  Surveillance CT scans 05/06/2016-negative for recurrent rectal cancer, decreased right external iliac node, persistent left hydronephrosis  CT abdomen/pelvis 07/05/2017-new left para-aortic, aortocaval, right common iliac and right external iliac lymphadenopathy.  CT neck/chest 07/13/2017-enlarged left supraclavicular nodes, new paratracheal nodes  Ultrasound-guided biopsy of a left supraclavicular lymph node 07/25/2017-metastatic adenocarcinoma consistent with colorectal cancer,MSI-stable, tumor mutation burden-10, K-ras S46mtation  Cycle 1 FOLFIRI 08/28/2017  Cycle 2 FOLFIRI 09/11/2017  Cycle 3 FOLFIRI 09/25/2017  Cycle 4 FOLFIRI 10/09/2017  Cycle 5 FOLFIRI 10/23/2017  CTs 11/14/2017-slight decrease in left supraclavicular adenopathy, slight increase and decrease in mediastinal/retroperitoneal/pelvic nodes, no new disease  Cycle 6 FOLFIRI 11/14/2017  Cycle 7 FOLFIRI 11/27/2017  Cycle 8 FOLFIRI 12/11/2017  Cycle 9 FOLFIRI 01/03/2018  Cycle 10 FOLFIRI 01/18/2018  Restaging CTs 01/29/2018-stable thoracic adenopathywith mildly improved abdominal adenopathy. Similar appearance of presacral soft tissue density. Suspected small fistula from the posterior  margin of the rectum or  anastomosis to the presacral region.  Cycle 11 FOLFIRI 01/31/2018  Cycle 12 FOLFIRI 02/20/2018  CT pelvis 03/16/2018-mild progression of pelvic lymphadenopathy with compression of the right iliac vein by an enlarged right obturator node  Cycle 1 FOLFOX 04/23/2018  CT emergency department 04/24/2018-slight interval increase in retroperitoneal adenopathy and right pelvic sidewall mass.  Cycle 2 FOLFOX 05/10/2018  Brain CT 05/10/2018-left parietal metastasis with extensive vasogenic edema  Brain MRI 05/22/2018-3 cm solitary left parietal metastasis with extensive vasogenic edema  SRS left parietal metastasis12/04/2018, brain mass resection12/05/2018-metastatic adenocarcinoma consistent with primary rectal adenocarcinoma  Cycle 3 FOLFOX 06/07/2018  Cycle 4 FOLFOX 06/28/2018, G-CSF support added  Cycle 5 FOLFOX 07/23/2018  Restaging CTs 08/02/2018- majority of nodal metastasis including within the abdomen/pelvis decreased in size since 04/24/2018.  There has been enlargement of a left supraclavicular node since most recent chest CT 01/29/2018.  No new sites of disease identified.  Cycle 6 FOLFOX 08/07/2018 2. Post colonoscopy bowel perforation confirmed on abdomen CT 05/28/2013. 3. Single indeterminate liver lesion on the abdomen CT 05/28/2013. MRI on 06/18/2013 confirmed 2 tiny liver lesions, indeterminate-favored to be benign cysts. 4. History of cervical and lumbar disc disease.  5. History of HELLP syndrome with first pregnancy.  6. Delayed nausea following cycle 1 FOLFOX. She receives Aloxi and Emend. She continues to experience delayed nausea. Prophylactic Decadron added with cycle 4. 7. History of pain with bowel movements. 8. Anastomotic leak on CT 09/13/2013. Status post exploratory laparotomy with drainage of a pelvic abscess, diverting loop ileostomy. Drainage catheter removed 10/23/2013.  Ileostomy reversal 05/03/2014  CT 06/02/2014 with a persistent  presacral gas collection with evidence of a fistulous communication with the rectum 9. Perineal numbness, urinary retention. The urinary retention has improved. 10. Perineal pain-improved. 11. Neutropenia secondary chemotherapy-she received Neulasta. 12. Urinary tract infection, Escherichia coli-11/03/2013. 13. Venous engorgement at the left anterior chest and left arm with swelling of the left arm and hand. Venous Doppler 11/14/2013 positive for DVT. She began Lovenox 11/14/2013 and was switched to xarelto 12/10/2013. Xarelto discontinued early January 2016. 14. Fever 11/20/2013. Blood and urine cultures negative. The fever resolved within 24 hours. She continued to have a single episode of fever following chemotherapy 15. History of a herpetic lip lesion 16. 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 17. Escherichia coli bacteremia, urinary source, 01/14/2014. She completed a 10 day course of ceftriaxone. 54. 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 Anxiety/depression. 20. Port-A-Cath removal 06/09/2014. 21. Escherichia coli urinary tract infection 06/24/2014. She completed a course of ciprofloxacin. 22. frequent bowel movements following surgery/radiation -status post placement of a sacral stimulator 11/21/2014 23. CT 06/16/2015 with new left hydronephrosis status post cystoscopy with placement of a double-J stent 07/17/2015. No tumors or stones were seen in the bladder. Both ureteral orifices were unremarkable. On the left side retrograde study was performed. The distal ureter was narrowed and then a little bit more dilated proximally with hydronephrosis noted. There was no evidence of intramural tumor but the area did seem a bit strictured.  Stent removed, persistent hydronephrosis, followed by Dr. Amalia Hailey  CT 05/06/2016-increased left hydronephrosis  Ultrasound 09/09/2016-stable left hydroureteronephrosis  Left nephrectomy  04/28/2017  24.Compression of right iliac vein on CT 03/16/2018-placed on Xarelto anticoagulation, persistent swelling of the right lower extremity, negative Doppler 03/12/2018 and 06/19/2018   Disposition: [name] appears stable.  She has completed 5 cycles of FOLFOX.  Dr. Benay Spice had already contacted her about the recent restaging CT results.  Nodal metastasis in the abdomen  and pelvis showed improvement.  The left supraclavicular lymph node was compared to a more remote CT scan.  The plan is to continue FOLFOX.  She is in agreement.  She will receive cycle 6 FOLFOX today as scheduled.  For the nausea she will begin dexamethasone 4 mg once a day for 3 days beginning on the day of pump discontinuation.  She will take Compazine and lorazepam as needed.  She understands she can begin Zofran as needed 72 hours after treatment today.  She will return for lab, follow-up and cycle 7 FOLFOX in 2 weeks.  She will contact the office in the interim with any problems.    Ned Card ANP/GNP-BC   08/07/2018  1:07 PM

## 2018-08-09 ENCOUNTER — Inpatient Hospital Stay: Payer: 59

## 2018-08-09 ENCOUNTER — Other Ambulatory Visit: Payer: Self-pay | Admitting: Medical

## 2018-08-09 ENCOUNTER — Inpatient Hospital Stay (HOSPITAL_BASED_OUTPATIENT_CLINIC_OR_DEPARTMENT_OTHER): Payer: 59 | Admitting: Medical

## 2018-08-09 VITALS — BP 124/82 | Temp 99.0°F | Resp 18

## 2018-08-09 DIAGNOSIS — Z5111 Encounter for antineoplastic chemotherapy: Secondary | ICD-10-CM | POA: Diagnosis not present

## 2018-08-09 DIAGNOSIS — N390 Urinary tract infection, site not specified: Secondary | ICD-10-CM

## 2018-08-09 DIAGNOSIS — C20 Malignant neoplasm of rectum: Secondary | ICD-10-CM

## 2018-08-09 DIAGNOSIS — R112 Nausea with vomiting, unspecified: Secondary | ICD-10-CM

## 2018-08-09 DIAGNOSIS — Z95828 Presence of other vascular implants and grafts: Secondary | ICD-10-CM

## 2018-08-09 DIAGNOSIS — E860 Dehydration: Secondary | ICD-10-CM

## 2018-08-09 DIAGNOSIS — R319 Hematuria, unspecified: Secondary | ICD-10-CM

## 2018-08-09 MED ORDER — PROMETHAZINE HCL 25 MG/ML IJ SOLN
INTRAMUSCULAR | Status: AC
  Filled 2018-08-09: qty 1

## 2018-08-09 MED ORDER — PEGFILGRASTIM-CBQV 6 MG/0.6ML ~~LOC~~ SOSY
6.0000 mg | PREFILLED_SYRINGE | Freq: Once | SUBCUTANEOUS | Status: AC
  Administered 2018-08-09: 6 mg via SUBCUTANEOUS

## 2018-08-09 MED ORDER — PROMETHAZINE HCL 25 MG/ML IJ SOLN: 12.5000 mg | Freq: Once | INTRAMUSCULAR | Status: DC

## 2018-08-09 MED ORDER — SODIUM CHLORIDE 0.9 % IV SOLN
INTRAVENOUS | Status: DC
  Administered 2018-08-09: 16:00:00 via INTRAVENOUS
  Filled 2018-08-09 (×2): qty 250

## 2018-08-09 MED ORDER — HEPARIN SOD (PORK) LOCK FLUSH 100 UNIT/ML IV SOLN
500.0000 [IU] | Freq: Once | INTRAVENOUS | Status: AC | PRN
  Administered 2018-08-09: 500 [IU]
  Filled 2018-08-09: qty 5

## 2018-08-09 MED ORDER — PEGFILGRASTIM-CBQV 6 MG/0.6ML ~~LOC~~ SOSY
PREFILLED_SYRINGE | SUBCUTANEOUS | Status: AC
  Filled 2018-08-09: qty 0.6

## 2018-08-09 MED ORDER — SODIUM CHLORIDE 0.9% FLUSH
10.0000 mL | INTRAVENOUS | Status: AC | PRN
  Administered 2018-08-09 (×2): 10 mL
  Filled 2018-08-09: qty 10

## 2018-08-09 MED ORDER — PROMETHAZINE HCL 25 MG/ML IJ SOLN
12.5000 mg | Freq: Once | INTRAMUSCULAR | Status: AC
  Administered 2018-08-09: 12.5 mg via INTRAVENOUS

## 2018-08-09 NOTE — Patient Instructions (Signed)
Pegfilgrastim injection  What is this medicine?  PEGFILGRASTIM (PEG fil gra stim) is a long-acting granulocyte colony-stimulating factor that stimulates the growth of neutrophils, a type of white blood cell important in the body's fight against infection. It is used to reduce the incidence of fever and infection in patients with certain types of cancer who are receiving chemotherapy that affects the bone marrow, and to increase survival after being exposed to high doses of radiation.  This medicine may be used for other purposes; ask your health care provider or pharmacist if you have questions.  COMMON BRAND NAME(S): Fulphila, Neulasta, UDENYCA  What should I tell my health care provider before I take this medicine?  They need to know if you have any of these conditions:  -kidney disease  -latex allergy  -ongoing radiation therapy  -sickle cell disease  -skin reactions to acrylic adhesives (On-Body Injector only)  -an unusual or allergic reaction to pegfilgrastim, filgrastim, other medicines, foods, dyes, or preservatives  -pregnant or trying to get pregnant  -breast-feeding  How should I use this medicine?  This medicine is for injection under the skin. If you get this medicine at home, you will be taught how to prepare and give the pre-filled syringe or how to use the On-body Injector. Refer to the patient Instructions for Use for detailed instructions. Use exactly as directed. Tell your healthcare provider immediately if you suspect that the On-body Injector may not have performed as intended or if you suspect the use of the On-body Injector resulted in a missed or partial dose.  It is important that you put your used needles and syringes in a special sharps container. Do not put them in a trash can. If you do not have a sharps container, call your pharmacist or healthcare provider to get one.  Talk to your pediatrician regarding the use of this medicine in children. While this drug may be prescribed for  selected conditions, precautions do apply.  Overdosage: If you think you have taken too much of this medicine contact a poison control center or emergency room at once.  NOTE: This medicine is only for you. Do not share this medicine with others.  What if I miss a dose?  It is important not to miss your dose. Call your doctor or health care professional if you miss your dose. If you miss a dose due to an On-body Injector failure or leakage, a new dose should be administered as soon as possible using a single prefilled syringe for manual use.  What may interact with this medicine?  Interactions have not been studied.  Give your health care provider a list of all the medicines, herbs, non-prescription drugs, or dietary supplements you use. Also tell them if you smoke, drink alcohol, or use illegal drugs. Some items may interact with your medicine.  This list may not describe all possible interactions. Give your health care provider a list of all the medicines, herbs, non-prescription drugs, or dietary supplements you use. Also tell them if you smoke, drink alcohol, or use illegal drugs. Some items may interact with your medicine.  What should I watch for while using this medicine?  You may need blood work done while you are taking this medicine.  If you are going to need a MRI, CT scan, or other procedure, tell your doctor that you are using this medicine (On-Body Injector only).  What side effects may I notice from receiving this medicine?  Side effects that you should report to   your doctor or health care professional as soon as possible:  -allergic reactions like skin rash, itching or hives, swelling of the face, lips, or tongue  -back pain  -dizziness  -fever  -pain, redness, or irritation at site where injected  -pinpoint red spots on the skin  -red or dark-brown urine  -shortness of breath or breathing problems  -stomach or side pain, or pain at the shoulder  -swelling  -tiredness  -trouble passing urine or  change in the amount of urine  Side effects that usually do not require medical attention (report to your doctor or health care professional if they continue or are bothersome):  -bone pain  -muscle pain  This list may not describe all possible side effects. Call your doctor for medical advice about side effects. You may report side effects to FDA at 1-800-FDA-1088.  Where should I keep my medicine?  Keep out of the reach of children.  If you are using this medicine at home, you will be instructed on how to store it. Throw away any unused medicine after the expiration date on the label.  NOTE: This sheet is a summary. It may not cover all possible information. If you have questions about this medicine, talk to your doctor, pharmacist, or health care provider.   2019 Elsevier/Gold Standard (2017-09-11 16:57:08)

## 2018-08-10 NOTE — Progress Notes (Signed)
The patient was seen in the infusion room today as she was receiving 1 L of normal saline.  She had taken 0.5 mg of Ativan sublingual before leaving the house but continues to have nausea.  Based on this she was given Phenergan 12.5 mg IV x1.  Sandi Mealy, MHS, PA-C Physician Assistant

## 2018-08-13 ENCOUNTER — Telehealth: Payer: Self-pay | Admitting: *Deleted

## 2018-08-13 NOTE — Telephone Encounter (Signed)
Patient states she is having painful intercourse and sees pink tinged fluid. Not sure if it is blood. States she has not used any lubricant. States she will try that. Her insurance has lapsed so she cancelled her appt with her ob-gyn. She sees dr Benay Spice in 1 week. Instructed her to call back if it gets worse, before then.

## 2018-08-13 NOTE — Telephone Encounter (Signed)
Medical records faxed to [place]; [place] [phone]

## 2018-08-17 ENCOUNTER — Other Ambulatory Visit: Payer: Self-pay | Admitting: Nurse Practitioner

## 2018-08-17 DIAGNOSIS — C20 Malignant neoplasm of rectum: Secondary | ICD-10-CM

## 2018-08-18 ENCOUNTER — Other Ambulatory Visit: Payer: Self-pay | Admitting: Oncology

## 2018-08-20 ENCOUNTER — Inpatient Hospital Stay: Payer: Medicare Other | Attending: Nurse Practitioner | Admitting: Oncology

## 2018-08-20 ENCOUNTER — Other Ambulatory Visit: Payer: Self-pay

## 2018-08-20 ENCOUNTER — Inpatient Hospital Stay: Payer: Medicare Other

## 2018-08-20 VITALS — BP 110/90 | HR 71 | Temp 98.6°F | Resp 18 | Ht 64.0 in | Wt 155.9 lb

## 2018-08-20 DIAGNOSIS — C770 Secondary and unspecified malignant neoplasm of lymph nodes of head, face and neck: Secondary | ICD-10-CM | POA: Diagnosis not present

## 2018-08-20 DIAGNOSIS — D6959 Other secondary thrombocytopenia: Secondary | ICD-10-CM | POA: Insufficient documentation

## 2018-08-20 DIAGNOSIS — Z95828 Presence of other vascular implants and grafts: Secondary | ICD-10-CM

## 2018-08-20 DIAGNOSIS — C7931 Secondary malignant neoplasm of brain: Secondary | ICD-10-CM | POA: Insufficient documentation

## 2018-08-20 DIAGNOSIS — Z452 Encounter for adjustment and management of vascular access device: Secondary | ICD-10-CM | POA: Insufficient documentation

## 2018-08-20 DIAGNOSIS — G620 Drug-induced polyneuropathy: Secondary | ICD-10-CM | POA: Insufficient documentation

## 2018-08-20 DIAGNOSIS — C20 Malignant neoplasm of rectum: Secondary | ICD-10-CM

## 2018-08-20 DIAGNOSIS — Z7901 Long term (current) use of anticoagulants: Secondary | ICD-10-CM | POA: Diagnosis not present

## 2018-08-20 DIAGNOSIS — Z5111 Encounter for antineoplastic chemotherapy: Secondary | ICD-10-CM | POA: Insufficient documentation

## 2018-08-20 DIAGNOSIS — Z5189 Encounter for other specified aftercare: Secondary | ICD-10-CM | POA: Insufficient documentation

## 2018-08-20 DIAGNOSIS — R319 Hematuria, unspecified: Secondary | ICD-10-CM

## 2018-08-20 DIAGNOSIS — I871 Compression of vein: Secondary | ICD-10-CM | POA: Insufficient documentation

## 2018-08-20 DIAGNOSIS — N390 Urinary tract infection, site not specified: Secondary | ICD-10-CM

## 2018-08-20 LAB — CBC WITH DIFFERENTIAL (CANCER CENTER ONLY)
Abs Immature Granulocytes: 2.11 10*3/uL — ABNORMAL HIGH (ref 0.00–0.07)
Basophils Absolute: 0 10*3/uL (ref 0.0–0.1)
Basophils Relative: 0 %
Eosinophils Absolute: 0.3 10*3/uL (ref 0.0–0.5)
Eosinophils Relative: 2 %
HCT: 33.1 % — ABNORMAL LOW (ref 36.0–46.0)
Hemoglobin: 10.6 g/dL — ABNORMAL LOW (ref 12.0–15.0)
Immature Granulocytes: 15 %
Lymphocytes Relative: 7 %
Lymphs Abs: 1 10*3/uL (ref 0.7–4.0)
MCH: 31.6 pg (ref 26.0–34.0)
MCHC: 32 g/dL (ref 30.0–36.0)
MCV: 98.8 fL (ref 80.0–100.0)
Monocytes Absolute: 0.9 10*3/uL (ref 0.1–1.0)
Monocytes Relative: 6 %
Neutro Abs: 10 10*3/uL — ABNORMAL HIGH (ref 1.7–7.7)
Neutrophils Relative %: 70 %
Platelet Count: 75 10*3/uL — ABNORMAL LOW (ref 150–400)
RBC: 3.35 MIL/uL — AB (ref 3.87–5.11)
RDW: 17.6 % — ABNORMAL HIGH (ref 11.5–15.5)
WBC Count: 14.3 10*3/uL — ABNORMAL HIGH (ref 4.0–10.5)
nRBC: 0.2 % (ref 0.0–0.2)

## 2018-08-20 LAB — CMP (CANCER CENTER ONLY)
ALT: 20 U/L (ref 0–44)
AST: 23 U/L (ref 15–41)
Albumin: 3.5 g/dL (ref 3.5–5.0)
Alkaline Phosphatase: 129 U/L — ABNORMAL HIGH (ref 38–126)
Anion gap: 8 (ref 5–15)
BUN: 12 mg/dL (ref 6–20)
CO2: 24 mmol/L (ref 22–32)
Calcium: 8.8 mg/dL — ABNORMAL LOW (ref 8.9–10.3)
Chloride: 109 mmol/L (ref 98–111)
Creatinine: 1.04 mg/dL — ABNORMAL HIGH (ref 0.44–1.00)
GFR, Est AFR Am: >60 mL/min (ref >60)
GFR, Estimated: >60 mL/min (ref >60)
Glucose, Bld: 101 mg/dL — ABNORMAL HIGH (ref 70–99)
POTASSIUM: 4.1 mmol/L (ref 3.5–5.1)
Sodium: 141 mmol/L (ref 135–145)
Total Bilirubin: 0.2 mg/dL — ABNORMAL LOW (ref 0.3–1.2)
Total Protein: 7.1 g/dL (ref 6.5–8.1)

## 2018-08-20 LAB — CEA (IN HOUSE-CHCC): CEA (CHCC-In House): 14.45 ng/mL — ABNORMAL HIGH (ref 0.00–5.00)

## 2018-08-20 MED ORDER — SODIUM CHLORIDE 0.9% FLUSH
10.0000 mL | INTRAVENOUS | Status: DC | PRN
  Administered 2018-08-20: 10 mL
  Filled 2018-08-20: qty 10

## 2018-08-20 MED ORDER — PALONOSETRON HCL INJECTION 0.25 MG/5ML
INTRAVENOUS | Status: AC
  Filled 2018-08-20: qty 5

## 2018-08-20 MED ORDER — DEXTROSE 5 % IV SOLN
Freq: Once | INTRAVENOUS | Status: AC
  Administered 2018-08-20: 13:00:00 via INTRAVENOUS
  Filled 2018-08-20: qty 250

## 2018-08-20 MED ORDER — OXALIPLATIN CHEMO INJECTION 100 MG/20ML
65.0000 mg/m2 | Freq: Once | INTRAVENOUS | Status: AC
  Administered 2018-08-20: 115 mg via INTRAVENOUS
  Filled 2018-08-20: qty 20

## 2018-08-20 MED ORDER — PALONOSETRON HCL INJECTION 0.25 MG/5ML
0.2500 mg | Freq: Once | INTRAVENOUS | Status: AC
  Administered 2018-08-20: 0.25 mg via INTRAVENOUS

## 2018-08-20 MED ORDER — LEUCOVORIN CALCIUM INJECTION 350 MG
400.0000 mg/m2 | Freq: Once | INTRAVENOUS | Status: AC
  Administered 2018-08-20: 720 mg via INTRAVENOUS
  Filled 2018-08-20: qty 36

## 2018-08-20 MED ORDER — SODIUM CHLORIDE 0.9 % IV SOLN
2400.0000 mg/m2 | INTRAVENOUS | Status: DC
  Administered 2018-08-20: 4300 mg via INTRAVENOUS
  Filled 2018-08-20: qty 86

## 2018-08-20 MED ORDER — FLUOROURACIL CHEMO INJECTION 2.5 GM/50ML
400.0000 mg/m2 | Freq: Once | INTRAVENOUS | Status: AC
  Administered 2018-08-20: 700 mg via INTRAVENOUS
  Filled 2018-08-20: qty 14

## 2018-08-20 MED ORDER — SODIUM CHLORIDE 0.9 % IV SOLN
Freq: Once | INTRAVENOUS | Status: AC
  Administered 2018-08-20: 13:00:00 via INTRAVENOUS
  Filled 2018-08-20: qty 5

## 2018-08-20 MED ORDER — DEXAMETHASONE SODIUM PHOSPHATE 10 MG/ML IJ SOLN
INTRAMUSCULAR | Status: AC
  Filled 2018-08-20: qty 1

## 2018-08-20 NOTE — Progress Notes (Signed)
MD review of labs today: OK to treat with platelet count 75,000. Will dose reduce oxaliplatin.

## 2018-08-20 NOTE — Progress Notes (Signed)
[place] OFFICE PROGRESS NOTE   Diagnosis: Rectal cancer  INTERVAL HISTORY:   [name] pleaded another cycle of FOLFOX on 08/07/2018.  She reports having nausea for 4 to 5 days following chemotherapy despite home antiemetics and Decadron.  The right leg pain and swelling have improved.  She has mild tingling in the extremities.  This does not interfere with activity.  Objective:  Vital signs in last 24 hours:  Blood pressure 110/90, pulse 71, temperature 98.6 F (37 C), temperature source Oral, resp. rate 18, height 5' 4"  (1.626 m), weight 155 lb 14.4 oz (70.7 kg), SpO2 99 %.    HEENT: No thrush or ulcers, 2-3 centimeter mass in the left lower neck Resp: Lungs clear bilaterally Cardio: Regular rate and rhythm GI: No hepatosplenomegaly, no mass, nontender Vascular: Base edema at the right lower leg Neuro: Mild loss of vibratory sense at the fingertips bilaterally    Portacath/PICC-without erythema  Lab Results:  Lab Results  Component Value Date   WBC 14.3 (H) 08/20/2018   HGB 10.6 (L) 08/20/2018   HCT 33.1 (L) 08/20/2018   MCV 98.8 08/20/2018   PLT 75 (L) 08/20/2018   NEUTROABS 10.0 (H) 08/20/2018    CMP  Lab Results  Component Value Date   NA 141 08/20/2018   K 4.1 08/20/2018   CL 109 08/20/2018   CO2 24 08/20/2018   GLUCOSE 101 (H) 08/20/2018   BUN 12 08/20/2018   CREATININE 1.04 (H) 08/20/2018   CALCIUM 8.8 (L) 08/20/2018   PROT 7.1 08/20/2018   ALBUMIN 3.5 08/20/2018   AST 23 08/20/2018   ALT 20 08/20/2018   ALKPHOS 129 (H) 08/20/2018   BILITOT 0.2 (L) 08/20/2018   GFRNONAA >60 08/20/2018   GFRAA >60 08/20/2018    Lab Results  Component Value Date   CEA1 13.44 (H) 08/07/2018     Medications: I have reviewed the patient's current medications.   Assessment/Plan: 1. Rectal cancer, clinical stage III (T3 N1) status post biopsy of a rectal mass on 05/24/2013 confirming adenocarcinoma. Endoscopic ultrasound 05/31/2013  measured the mass at 11 cm from the anal verge, uT3uN1.  Initiation of radiation and concurrent Xeloda 06/18/2013; completion 07/26/2013.   Status post low anterior resection 09/02/2013. Invasive adenocarcinoma, 3.7 cm, negative margins; metastatic carcinoma in 7 of 11 lymph nodes; extensive lymph vascular involvement by tumor.MSI-stable, no loss of mismatch repair protein expression  Cycle 1 adjuvant FOLFOX 10/15/2013.   Cycle 2 adjuvant FOLFOX 10/29/2013.   Cycle 3 adjuvant FOLFOX 11/14/2013.   Cycle 4 adjuvant FOLFOX 11/27/2013.   Cycle 5 adjuvant FOLFOX 12/10/2013   Cycle 6 adjuvant FOLFOX 12/24/2013.   Cycle 7 adjuvant FOLFOX 01/14/2014.   Cycle 8 adjuvant FOLFOX 01/28/2014   Cycle 9 adjuvant FOLFOX 02/11/2014 (oxaliplatin dose reduced secondary to thrombocytopenia)   Surveillance CT scans 06/02/2014 with no evidence of recurrent rectal cancer  surveillance CT scans 06/16/2015 with no evidence of recurrent rectal cancer, 10 mm right external iliac node , increased amount of loculated air associated with the presacral soft tissue thickening , new left hydronephrosis  Surveillance CT scans 05/06/2016-negative for recurrent rectal cancer, decreased right external iliac node, persistent left hydronephrosis  CT abdomen/pelvis 07/05/2017-new left para-aortic, aortocaval, right common iliac and right external iliac lymphadenopathy.  CT neck/chest 07/13/2017-enlarged left supraclavicular nodes, new paratracheal nodes  Ultrasound-guided biopsy of a left supraclavicular lymph node 07/25/2017-metastatic adenocarcinoma consistent with colorectal cancer,MSI-stable, tumor mutation burden-10, K-ras S30mtation  Cycle 1 FOLFIRI 08/28/2017  Cycle 2 FOLFIRI 09/11/2017  Cycle 3 FOLFIRI 09/25/2017  Cycle 4 FOLFIRI 10/09/2017  Cycle 5 FOLFIRI 10/23/2017  CTs 11/14/2017-slight decrease in left supraclavicular adenopathy, slight increase and decrease in  mediastinal/retroperitoneal/pelvic nodes, no new disease  Cycle 6 FOLFIRI 11/14/2017  Cycle 7 FOLFIRI 11/27/2017  Cycle 8 FOLFIRI 12/11/2017  Cycle 9 FOLFIRI 01/03/2018  Cycle 10 FOLFIRI 01/18/2018  Restaging CTs 01/29/2018-stable thoracic adenopathywith mildly improved abdominal adenopathy. Similar appearance of presacral soft tissue density. Suspected small fistula from the posterior margin of the rectum or anastomosis to the presacral region.  Cycle 11 FOLFIRI 01/31/2018  Cycle 12 FOLFIRI 02/20/2018  CT pelvis 03/16/2018-mild progression of pelvic lymphadenopathy with compression of the right iliac vein by an enlarged right obturator node  Cycle 1 FOLFOX 04/23/2018  CT emergency department 04/24/2018-slight interval increase in retroperitoneal adenopathy and right pelvic sidewall mass.  Cycle 2 FOLFOX 05/10/2018  Brain CT 05/10/2018-left parietal metastasis with extensive vasogenic edema  Brain MRI 05/22/2018-3 cm solitary left parietal metastasis with extensive vasogenic edema  SRS left parietal metastasis12/04/2018, brain mass resection12/05/2018-metastatic adenocarcinoma consistent with primary rectal adenocarcinoma  Cycle 3 FOLFOX 06/07/2018  Cycle 4 FOLFOX 06/28/2018, G-CSF support added  Cycle 5 FOLFOX 07/23/2018  Restaging CTs 08/02/2018- majority of nodal metastasis including within the abdomen/pelvis decreased in size since 04/24/2018.  There has been enlargement of a left supraclavicular node since most recent chest CT 01/29/2018.  No new sites of disease identified.  Cycle 6 FOLFOX 08/07/2018  Cycle 7 FOLFOX 08/20/2018 (oxaliplatin dose reduced secondary to thrombocytopenia) 2. Post colonoscopy bowel perforation confirmed on abdomen CT 05/28/2013. 3. Single indeterminate liver lesion on the abdomen CT 05/28/2013. MRI on 06/18/2013 confirmed 2 tiny liver lesions, indeterminate-favored to be benign cysts. 4. History of cervical and lumbar disc disease.  5. History of  HELLP syndrome with first pregnancy.  6. Delayed nausea following cycle 1 FOLFOX. She receives Aloxi and Emend. She continues to experience delayed nausea. Prophylactic Decadron added with cycle 4. 7. History of pain with bowel movements. 8. Anastomotic leak on CT 09/13/2013. Status post exploratory laparotomy with drainage of a pelvic abscess, diverting loop ileostomy. Drainage catheter removed 10/23/2013.  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 Perineal numbness, urinary retention. The urinary retention has improved. 10. Perineal pain-improved. 11. Neutropenia secondary chemotherapy-she received Neulasta. 12. Urinary tract infection, Escherichia coli-11/03/2013. 13. Venous engorgement at the left anterior chest and left arm with swelling of the left arm and hand. Venous Doppler 11/14/2013 positive for DVT. She began Lovenox 11/14/2013 and was switched to xarelto 12/10/2013. Xarelto discontinued early January 2016. 14. Fever 11/20/2013. Blood and urine cultures negative. The fever resolved within 24 hours. She continued to have a single episode of fever following chemotherapy 15. History of a herpetic lip lesion 16. 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 17. Escherichia coli bacteremia, urinary source, 01/14/2014. She completed a 10 day course of ceftriaxone. 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 Anxiety/depression. 20. Port-A-Cath removal 06/09/2014. 21. Escherichia coli urinary tract infection 06/24/2014. She completed a course of ciprofloxacin. 22. frequent bowel movements following surgery/radiation -status post placement of a sacral stimulator 11/21/2014 23. CT 06/16/2015 with new left hydronephrosis status post cystoscopy with placement of a double-J stent 07/17/2015. No tumors or stones were seen in the bladder. Both ureteral orifices were unremarkable. On the left side  retrograde study was performed. The distal ureter was narrowed and then a little bit more dilated proximally with hydronephrosis noted. There was no evidence  of intramural tumor but the area did seem a bit strictured.  Stent removed, persistent hydronephrosis, followed by Dr. Amalia Hailey  CT 05/06/2016-increased left hydronephrosis  Ultrasound 09/09/2016-stable left hydroureteronephrosis  Left nephrectomy 04/28/2017  24.Compression of right iliac vein on CT 03/16/2018-placed on Xarelto anticoagulation, persistent swelling of the right lower extremity, negative Doppler 03/12/2018 and 06/19/2018     Disposition: [name] appears unchanged.  She will complete another cycle of FOLFOX today.  The oxaliplatin will be dose reduced secondary to thrombocytopenia.  Her overall status appears unchanged.  The left neck mass is stable.  The plan is to continue FOLFOX chemotherapy with close follow-up of her clinical status and neuropathy symptoms.  We will plan for a restaging CT evaluation after 2-3 more cycles of FOLFOX.  Emend will be added to the antiemetic regimen today.  The plan is to add Zyprexa with the next cycle if this is not helpful.  She will return for an office visit and chemotherapy in 2 weeks.  Betsy Coder, MD  08/20/2018  12:15 PM

## 2018-08-20 NOTE — Patient Instructions (Signed)
[facility] Discharge Instructions for Patients Receiving Chemotherapy  Today you received the following chemotherapy agents: Oxaliplatin, Leucovorin, and Adrucil.  To help prevent nausea and vomiting after your treatment, we encourage you to take your nausea medication as directed.   If you develop nausea and vomiting that is not controlled by your nausea medication, call the clinic.   BELOW ARE SYMPTOMS THAT SHOULD BE REPORTED IMMEDIATELY:  *FEVER GREATER THAN 100.5 F  *CHILLS WITH OR WITHOUT FEVER  NAUSEA AND VOMITING THAT IS NOT CONTROLLED WITH YOUR NAUSEA MEDICATION  *UNUSUAL SHORTNESS OF BREATH  *UNUSUAL BRUISING OR BLEEDING  TENDERNESS IN MOUTH AND THROAT WITH OR WITHOUT PRESENCE OF ULCERS  *URINARY PROBLEMS  *BOWEL PROBLEMS  UNUSUAL RASH Items with * indicate a potential emergency and should be followed up as soon as possible.  Feel free to call the clinic should you have any questions or concerns. The clinic phone number is [phone].  Please show the CHEMO ALERT CARD at check-in to the Emergency Department and triage nurse.   

## 2018-08-21 ENCOUNTER — Telehealth: Payer: Self-pay | Admitting: *Deleted

## 2018-08-21 NOTE — Telephone Encounter (Addendum)
VM from [place] w/[employer]: She received a list of chemo meds that patient needs. States [facility] is "out of network" and on the [employer], so this would be a denial. Needs to speak with someone by 11:00 central time to make her decision on this. Also left faxe #[phone]---forwarded message to [facility] in managed care. @ 1334: Called back the above # and spoke with a Coralyn Mark and when she looked up the file, she sees that it has been approved.

## 2018-08-22 ENCOUNTER — Inpatient Hospital Stay: Payer: Medicare Other

## 2018-08-22 VITALS — BP 124/88 | HR 82 | Temp 99.0°F | Resp 18

## 2018-08-22 DIAGNOSIS — C20 Malignant neoplasm of rectum: Secondary | ICD-10-CM

## 2018-08-22 DIAGNOSIS — Z5111 Encounter for antineoplastic chemotherapy: Secondary | ICD-10-CM | POA: Diagnosis not present

## 2018-08-22 MED ORDER — PEGFILGRASTIM-CBQV 6 MG/0.6ML ~~LOC~~ SOSY
6.0000 mg | PREFILLED_SYRINGE | Freq: Once | SUBCUTANEOUS | Status: AC
  Administered 2018-08-22: 6 mg via SUBCUTANEOUS

## 2018-08-22 MED ORDER — SODIUM CHLORIDE 0.9% FLUSH
10.0000 mL | INTRAVENOUS | Status: DC | PRN
  Administered 2018-08-22: 10 mL
  Filled 2018-08-22: qty 10

## 2018-08-22 MED ORDER — PEGFILGRASTIM-CBQV 6 MG/0.6ML ~~LOC~~ SOSY
PREFILLED_SYRINGE | SUBCUTANEOUS | Status: AC
  Filled 2018-08-22: qty 0.6

## 2018-08-22 MED ORDER — HEPARIN SOD (PORK) LOCK FLUSH 100 UNIT/ML IV SOLN
500.0000 [IU] | Freq: Once | INTRAVENOUS | Status: AC | PRN
  Administered 2018-08-22: 500 [IU]
  Filled 2018-08-22: qty 5

## 2018-08-22 MED ORDER — SODIUM CHLORIDE 0.9 % IV SOLN
INTRAVENOUS | Status: AC
  Filled 2018-08-22: qty 250

## 2018-08-23 ENCOUNTER — Other Ambulatory Visit: Payer: Self-pay | Admitting: Oncology

## 2018-08-29 ENCOUNTER — Other Ambulatory Visit: Payer: Self-pay | Admitting: Radiation Therapy

## 2018-08-29 ENCOUNTER — Telehealth: Payer: Self-pay | Admitting: Radiation Oncology

## 2018-08-30 ENCOUNTER — Ambulatory Visit (HOSPITAL_COMMUNITY): Admission: RE | Admit: 2018-08-30 | Payer: Medicare Other | Source: Ambulatory Visit

## 2018-09-02 ENCOUNTER — Other Ambulatory Visit: Payer: Self-pay | Admitting: Oncology

## 2018-09-03 ENCOUNTER — Other Ambulatory Visit: Payer: Self-pay | Admitting: *Deleted

## 2018-09-03 ENCOUNTER — Ambulatory Visit: Payer: Self-pay | Admitting: Radiation Oncology

## 2018-09-04 ENCOUNTER — Other Ambulatory Visit: Payer: Self-pay

## 2018-09-04 ENCOUNTER — Inpatient Hospital Stay (HOSPITAL_BASED_OUTPATIENT_CLINIC_OR_DEPARTMENT_OTHER): Payer: Medicare Other | Admitting: Nurse Practitioner

## 2018-09-04 ENCOUNTER — Inpatient Hospital Stay: Payer: Medicare Other

## 2018-09-04 ENCOUNTER — Encounter: Payer: Self-pay | Admitting: Nurse Practitioner

## 2018-09-04 ENCOUNTER — Other Ambulatory Visit: Payer: Self-pay | Admitting: Nurse Practitioner

## 2018-09-04 VITALS — BP 139/75 | HR 93 | Temp 98.2°F | Resp 18 | Ht 64.0 in | Wt 152.0 lb

## 2018-09-04 DIAGNOSIS — C7931 Secondary malignant neoplasm of brain: Secondary | ICD-10-CM | POA: Diagnosis not present

## 2018-09-04 DIAGNOSIS — C20 Malignant neoplasm of rectum: Secondary | ICD-10-CM

## 2018-09-04 DIAGNOSIS — Z95828 Presence of other vascular implants and grafts: Secondary | ICD-10-CM

## 2018-09-04 DIAGNOSIS — Z7901 Long term (current) use of anticoagulants: Secondary | ICD-10-CM

## 2018-09-04 DIAGNOSIS — C770 Secondary and unspecified malignant neoplasm of lymph nodes of head, face and neck: Secondary | ICD-10-CM

## 2018-09-04 DIAGNOSIS — Z5111 Encounter for antineoplastic chemotherapy: Secondary | ICD-10-CM | POA: Diagnosis not present

## 2018-09-04 DIAGNOSIS — N390 Urinary tract infection, site not specified: Secondary | ICD-10-CM

## 2018-09-04 DIAGNOSIS — I871 Compression of vein: Secondary | ICD-10-CM

## 2018-09-04 DIAGNOSIS — G620 Drug-induced polyneuropathy: Secondary | ICD-10-CM

## 2018-09-04 DIAGNOSIS — R319 Hematuria, unspecified: Secondary | ICD-10-CM

## 2018-09-04 DIAGNOSIS — D6959 Other secondary thrombocytopenia: Secondary | ICD-10-CM

## 2018-09-04 LAB — CBC WITH DIFFERENTIAL (CANCER CENTER ONLY)
Abs Immature Granulocytes: 0.2 10*3/uL — ABNORMAL HIGH (ref 0.00–0.07)
BASOS ABS: 0 10*3/uL (ref 0.0–0.1)
Band Neutrophils: 14 %
Basophils Relative: 0 %
Eosinophils Absolute: 0.1 10*3/uL (ref 0.0–0.5)
Eosinophils Relative: 1 %
HCT: 37.3 % (ref 36.0–46.0)
Hemoglobin: 12 g/dL (ref 12.0–15.0)
Lymphocytes Relative: 8 %
Lymphs Abs: 0.9 10*3/uL (ref 0.7–4.0)
MCH: 32.1 pg (ref 26.0–34.0)
MCHC: 32.2 g/dL (ref 30.0–36.0)
MCV: 99.7 fL (ref 80.0–100.0)
METAMYELOCYTES PCT: 1 %
Monocytes Absolute: 0.5 10*3/uL (ref 0.1–1.0)
Monocytes Relative: 5 %
Myelocytes: 1 %
NEUTROS PCT: 70 %
Neutro Abs: 9 10*3/uL (ref 1.7–17.7)
Platelet Count: 113 10*3/uL — ABNORMAL LOW (ref 150–400)
RBC: 3.74 MIL/uL — ABNORMAL LOW (ref 3.87–5.11)
RDW: 17.4 % — AB (ref 11.5–15.5)
WBC Count: 10.7 10*3/uL — ABNORMAL HIGH (ref 4.0–10.5)
nRBC: 0 % (ref 0.0–0.2)

## 2018-09-04 LAB — CMP (CANCER CENTER ONLY)
ALT: 19 U/L (ref 0–44)
AST: 20 U/L (ref 15–41)
Albumin: 3.9 g/dL (ref 3.5–5.0)
Alkaline Phosphatase: 133 U/L — ABNORMAL HIGH (ref 38–126)
Anion gap: 11 (ref 5–15)
BUN: 15 mg/dL (ref 6–20)
CO2: 23 mmol/L (ref 22–32)
Calcium: 9.5 mg/dL (ref 8.9–10.3)
Chloride: 106 mmol/L (ref 98–111)
Creatinine: 1.11 mg/dL — ABNORMAL HIGH (ref 0.44–1.00)
GFR, Est AFR Am: >60 mL/min (ref >60)
GFR, Estimated: 56 mL/min — ABNORMAL LOW (ref >60)
Glucose, Bld: 92 mg/dL (ref 70–99)
POTASSIUM: 4.2 mmol/L (ref 3.5–5.1)
SODIUM: 140 mmol/L (ref 135–145)
Total Bilirubin: 0.3 mg/dL (ref 0.3–1.2)
Total Protein: 7.8 g/dL (ref 6.5–8.1)

## 2018-09-04 LAB — CEA (IN HOUSE-CHCC): CEA (CHCC-In House): 12.92 ng/mL — ABNORMAL HIGH (ref 0.00–5.00)

## 2018-09-04 MED ORDER — LEUCOVORIN CALCIUM INJECTION 350 MG
400.0000 mg/m2 | Freq: Once | INTRAVENOUS | Status: AC
  Administered 2018-09-04: 720 mg via INTRAVENOUS
  Filled 2018-09-04: qty 36

## 2018-09-04 MED ORDER — PALONOSETRON HCL INJECTION 0.25 MG/5ML
0.2500 mg | Freq: Once | INTRAVENOUS | Status: AC
  Administered 2018-09-04: 0.25 mg via INTRAVENOUS

## 2018-09-04 MED ORDER — OXALIPLATIN CHEMO INJECTION 100 MG/20ML
65.0000 mg/m2 | Freq: Once | INTRAVENOUS | Status: AC
  Administered 2018-09-04: 115 mg via INTRAVENOUS
  Filled 2018-09-04: qty 10

## 2018-09-04 MED ORDER — FLUOROURACIL CHEMO INJECTION 2.5 GM/50ML
400.0000 mg/m2 | Freq: Once | INTRAVENOUS | Status: AC
  Administered 2018-09-04: 700 mg via INTRAVENOUS
  Filled 2018-09-04: qty 14

## 2018-09-04 MED ORDER — ALPRAZOLAM 1 MG PO TABS: 1.0000 mg | ORAL_TABLET | Freq: Every evening | ORAL | 0 refills | Status: DC | PRN

## 2018-09-04 MED ORDER — DEXTROSE 5 % IV SOLN
Freq: Once | INTRAVENOUS | Status: AC
  Administered 2018-09-04: 11:00:00 via INTRAVENOUS
  Filled 2018-09-04: qty 250

## 2018-09-04 MED ORDER — DEXTROSE 5 % IV SOLN
Freq: Once | INTRAVENOUS | Status: AC
  Administered 2018-09-04: 12:00:00 via INTRAVENOUS
  Filled 2018-09-04: qty 250

## 2018-09-04 MED ORDER — SODIUM CHLORIDE 0.9 % IV SOLN
2400.0000 mg/m2 | INTRAVENOUS | Status: DC
  Administered 2018-09-04: 4300 mg via INTRAVENOUS
  Filled 2018-09-04: qty 86

## 2018-09-04 MED ORDER — SODIUM CHLORIDE 0.9 % IV SOLN
Freq: Once | INTRAVENOUS | Status: AC
  Administered 2018-09-04: 11:00:00 via INTRAVENOUS
  Filled 2018-09-04: qty 5

## 2018-09-04 MED ORDER — PALONOSETRON HCL INJECTION 0.25 MG/5ML
INTRAVENOUS | Status: AC
  Filled 2018-09-04: qty 5

## 2018-09-04 MED ORDER — SODIUM CHLORIDE 0.9% FLUSH
10.0000 mL | INTRAVENOUS | Status: DC | PRN
  Administered 2018-09-04: 10 mL
  Filled 2018-09-04: qty 10

## 2018-09-04 NOTE — Patient Instructions (Signed)
[facility] Discharge Instructions for Patients Receiving Chemotherapy  Today you received the following chemotherapy agents :  Oxaliplatin,  Leucovorin,  Fluorouracil.  To help prevent nausea and vomiting after your treatment, we encourage you to take your nausea medication as prescribed.   If you develop nausea and vomiting that is not controlled by your nausea medication, call the clinic.   BELOW ARE SYMPTOMS THAT SHOULD BE REPORTED IMMEDIATELY:  *FEVER GREATER THAN 100.5 F  *CHILLS WITH OR WITHOUT FEVER  NAUSEA AND VOMITING THAT IS NOT CONTROLLED WITH YOUR NAUSEA MEDICATION  *UNUSUAL SHORTNESS OF BREATH  *UNUSUAL BRUISING OR BLEEDING  TENDERNESS IN MOUTH AND THROAT WITH OR WITHOUT PRESENCE OF ULCERS  *URINARY PROBLEMS  *BOWEL PROBLEMS  UNUSUAL RASH Items with * indicate a potential emergency and should be followed up as soon as possible.  Feel free to call the clinic should you have any questions or concerns. The clinic phone number is [phone].  Please show the CHEMO ALERT CARD at check-in to the Emergency Department and triage nurse.   

## 2018-09-04 NOTE — Progress Notes (Signed)
[place] OFFICE PROGRESS NOTE   Diagnosis: Rectal cancer  INTERVAL HISTORY:   [name] returns as scheduled.  She completed cycle 7 FOLFOX 08/20/2018.  She had less nausea, no vomiting.  She has persistent cold sensitivity.  She eats 3 times a day but notes that appetite overall is poor.  No mouth sores.  No significant diarrhea.  She intermittently has pain with bowel movements.  Objective:  Vital signs in last 24 hours:  Blood pressure 139/75, pulse 93, temperature 98.2 F (36.8 C), temperature source Oral, resp. rate 18, height _0  (1.626 m), weight 152 lb (68.9 kg), SpO2 98 %.    HEENT: No thrush or ulcers.  Approximate 3 cm mass left lower neck. Resp: Lungs clear bilaterally. Cardio: Regular rate and rhythm. GI: Abdomen soft and nontender.  No hepatomegaly. Vascular: Right lower leg is slightly larger than the left lower leg. Neuro: Mild decrease in vibratory sense over the fingertips per tuning fork exam.  Port-A-Cath without erythema.  Lab Results:  Lab Results  Component Value Date   WBC 10.7 (H) 09/04/2018   HGB 12.0 09/04/2018   HCT 37.3 09/04/2018   MCV 99.7 09/04/2018   PLT 113 (L) 09/04/2018   NEUTROABS PENDING 09/04/2018    Imaging:  No results found.  Medications: I have reviewed the patient's current medications.  Assessment/Plan: 1. Rectal cancer, clinical stage III (T3 N1) status post biopsy of a rectal mass on 05/24/2013 confirming adenocarcinoma. Endoscopic ultrasound 05/31/2013 measured the mass at 11 cm from the anal verge, uT3uN1.  Initiation of radiation and concurrent Xeloda 06/18/2013; completion 07/26/2013.   Status post low anterior resection 09/02/2013. Invasive adenocarcinoma, 3.7 cm, negative margins; metastatic carcinoma in 7 of 11 lymph nodes; extensive lymph vascular involvement by tumor.MSI-stable, no loss of mismatch repair protein expression  Cycle 1 adjuvant FOLFOX 10/15/2013.   Cycle 2 adjuvant  FOLFOX 10/29/2013.   Cycle 3 adjuvant FOLFOX 11/14/2013.   Cycle 4 adjuvant FOLFOX 11/27/2013.   Cycle 5 adjuvant FOLFOX 12/10/2013   Cycle 6 adjuvant FOLFOX 12/24/2013.   Cycle 7 adjuvant FOLFOX 01/14/2014.   Cycle 8 adjuvant FOLFOX 01/28/2014   Cycle 9 adjuvant FOLFOX 02/11/2014 (oxaliplatin dose reduced secondary to thrombocytopenia)   Surveillance CT scans 06/02/2014 with no evidence of recurrent rectal cancer  surveillance CT scans 06/16/2015 with no evidence of recurrent rectal cancer, 10 mm right external iliac node , increased amount of loculated air associated with the presacral soft tissue thickening , new left hydronephrosis  Surveillance CT scans 05/06/2016-negative for recurrent rectal cancer, decreased right external iliac node, persistent left hydronephrosis  CT abdomen/pelvis 07/05/2017-new left para-aortic, aortocaval, right common iliac and right external iliac lymphadenopathy.  CT neck/chest 07/13/2017-enlarged left supraclavicular nodes, new paratracheal nodes  Ultrasound-guided biopsy of a left supraclavicular lymph node 07/25/2017-metastatic adenocarcinoma consistent with colorectal cancer,MSI-stable, tumor mutation burden-10, K-ras S15mtation  Cycle 1 FOLFIRI 08/28/2017  Cycle 2 FOLFIRI 09/11/2017  Cycle 3 FOLFIRI 09/25/2017  Cycle 4 FOLFIRI 10/09/2017  Cycle 5 FOLFIRI 10/23/2017  CTs 11/14/2017-slight decrease in left supraclavicular adenopathy, slight increase and decrease in mediastinal/retroperitoneal/pelvic nodes, no new disease  Cycle 6 FOLFIRI 11/14/2017  Cycle 7 FOLFIRI 11/27/2017  Cycle 8 FOLFIRI 12/11/2017  Cycle 9 FOLFIRI 01/03/2018  Cycle 10 FOLFIRI 01/18/2018  Restaging CTs 01/29/2018-stable thoracic adenopathywith mildly improved abdominal adenopathy. Similar appearance of presacral soft tissue density. Suspected small fistula from the posterior margin of the rectum or anastomosis to the presacral region.  Cycle 11 FOLFIRI  01/31/2018  Cycle  12 FOLFIRI 02/20/2018  CT pelvis 03/16/2018-mild progression of pelvic lymphadenopathy with compression of the right iliac vein by an enlarged right obturator node  Cycle 1 FOLFOX 04/23/2018  CT emergency department 04/24/2018-slight interval increase in retroperitoneal adenopathy and right pelvic sidewall mass.  Cycle 2 FOLFOX 05/10/2018  Brain CT 05/10/2018-left parietal metastasis with extensive vasogenic edema  Brain MRI 05/22/2018-3 cm solitary left parietal metastasis with extensive vasogenic edema  SRS left parietal metastasis12/04/2018, brain mass resection12/05/2018-metastatic adenocarcinoma consistent with primary rectal adenocarcinoma  Cycle 3 FOLFOX 06/07/2018  Cycle 4 FOLFOX 06/28/2018, G-CSF support added  Cycle 5 FOLFOX 07/23/2018  Restaging CTs 08/02/2018- majority of nodal metastasis including within the abdomen/pelvis decreased in size since 04/24/2018.  There has been enlargement of a left supraclavicular node since most recent chest CT 01/29/2018.  No new sites of disease identified.  Cycle 6 FOLFOX 08/07/2018  Cycle 7 FOLFOX 08/20/2018 (oxaliplatin dose reduced secondary to thrombocytopenia)  Cycle 8 FOLFOX 09/04/2018 2. Post colonoscopy bowel perforation confirmed on abdomen CT 05/28/2013. 3. Single indeterminate liver lesion on the abdomen CT 05/28/2013. MRI on 06/18/2013 confirmed 2 tiny liver lesions, indeterminate-favored to be benign cysts. 4. History of cervical and lumbar disc disease.  5. History of HELLP syndrome with first pregnancy.  6. Delayed nausea following cycle 1 FOLFOX. She receives Aloxi and Emend. She continues to experience delayed nausea. Prophylactic Decadron added with cycle 4. 7. History of pain with bowel movements. 8. Anastomotic leak on CT 09/13/2013. Status post exploratory laparotomy with drainage of a pelvic abscess, diverting loop ileostomy. Drainage catheter removed 10/23/2013.  Ileostomy reversal 05/03/2014  CT  06/02/2014 with a persistent presacral gas collection with evidence of a fistulous communication with the rectum 9. Perineal numbness, urinary retention. The urinary retention has improved. 10. Perineal pain-improved. 11. Neutropenia secondary chemotherapy-she received Neulasta. 12. Urinary tract infection, Escherichia coli-11/03/2013. 13. Venous engorgement at the left anterior chest and left arm with swelling of the left arm and hand. Venous Doppler 11/14/2013 positive for DVT. She began Lovenox 11/14/2013 and was switched to xarelto 12/10/2013. Xarelto discontinued early January 2016. 14. Fever 11/20/2013. Blood and urine cultures negative. The fever resolved within 24 hours. She continued to have a single episode of fever following chemotherapy 15. History of a herpetic lip lesion 16. 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 17. Escherichia coli bacteremia, urinary source, 01/14/2014. She completed a 10 day course of ceftriaxone. 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 Anxiety/depression. 20. Port-A-Cath removal 06/09/2014. 21. Escherichia coli urinary tract infection 06/24/2014. She completed a course of ciprofloxacin. 22. frequent bowel movements following surgery/radiation -status post placement of a sacral stimulator 11/21/2014 23. CT 06/16/2015 with new left hydronephrosis status post cystoscopy with placement of a double-J stent 07/17/2015. No tumors or stones were seen in the bladder. Both ureteral orifices were unremarkable. On the left side retrograde study was performed. The distal ureter was narrowed and then a little bit more dilated proximally with hydronephrosis noted. There was no evidence of intramural tumor but the area did seem a bit strictured.  Stent removed, persistent hydronephrosis, followed by Dr. Amalia Hailey  CT 05/06/2016-increased left hydronephrosis  Ultrasound 09/09/2016-stable left hydroureteronephrosis  Left nephrectomy 04/28/2017  24.Compression of right  iliac vein on CT 03/16/2018-placed on Xarelto anticoagulation, persistent swelling of the right lower extremity, negative Doppler 03/12/2018 and 06/19/2018    Disposition: Ms. Peace appears stable.  She has completed 7 cycles of FOLFOX.  There is no clinical evidence of disease progression.  Plan to proceed with cycle 8 today as scheduled.  We reviewed the CBC from today.  Counts are adequate for treatment.  Neuropathy symptoms are stable.  She will return for lab, follow-up and the next cycle of FOLFOX in 2 weeks.  She will contact the office in the interim with any problems.  Plan reviewed with Dr. Benay Spice.    Ned Card ANP/GNP-BC   09/04/2018  9:55 AM

## 2018-09-04 NOTE — Patient Instructions (Signed)

## 2018-09-06 ENCOUNTER — Other Ambulatory Visit: Payer: Self-pay

## 2018-09-06 ENCOUNTER — Inpatient Hospital Stay: Payer: Medicare Other

## 2018-09-06 VITALS — BP 102/74 | HR 91 | Temp 99.4°F | Resp 18

## 2018-09-06 DIAGNOSIS — C20 Malignant neoplasm of rectum: Secondary | ICD-10-CM

## 2018-09-06 DIAGNOSIS — Z5111 Encounter for antineoplastic chemotherapy: Secondary | ICD-10-CM | POA: Diagnosis not present

## 2018-09-06 MED ORDER — PEGFILGRASTIM-CBQV 6 MG/0.6ML ~~LOC~~ SOSY
PREFILLED_SYRINGE | SUBCUTANEOUS | Status: AC
  Filled 2018-09-06: qty 0.6

## 2018-09-06 MED ORDER — PEGFILGRASTIM-CBQV 6 MG/0.6ML ~~LOC~~ SOSY
6.0000 mg | PREFILLED_SYRINGE | Freq: Once | SUBCUTANEOUS | Status: AC
  Administered 2018-09-06: 6 mg via SUBCUTANEOUS

## 2018-09-06 MED ORDER — LORAZEPAM 1 MG PO TABS
ORAL_TABLET | ORAL | Status: AC
  Filled 2018-09-06: qty 1

## 2018-09-06 MED ORDER — SODIUM CHLORIDE 0.9 % IV SOLN
INTRAVENOUS | Status: AC
  Administered 2018-09-06: 15:00:00 via INTRAVENOUS
  Filled 2018-09-06 (×2): qty 250

## 2018-09-06 MED ORDER — SODIUM CHLORIDE 0.9% FLUSH
10.0000 mL | INTRAVENOUS | Status: DC | PRN
  Administered 2018-09-06: 10 mL
  Filled 2018-09-06: qty 10

## 2018-09-06 MED ORDER — LORAZEPAM 1 MG PO TABS
0.5000 mg | ORAL_TABLET | Freq: Once | ORAL | Status: AC
  Administered 2018-09-06: 0.5 mg via SUBLINGUAL

## 2018-09-06 MED ORDER — HEPARIN SOD (PORK) LOCK FLUSH 100 UNIT/ML IV SOLN
500.0000 [IU] | Freq: Once | INTRAVENOUS | Status: AC | PRN
  Administered 2018-09-06: 500 [IU]
  Filled 2018-09-06: qty 5

## 2018-09-06 NOTE — Progress Notes (Signed)
Patient decided to not receive all her fluids today.  She received around 550 mls of the liter bag.

## 2018-09-07 ENCOUNTER — Telehealth: Payer: Self-pay | Admitting: Oncology

## 2018-09-07 NOTE — Telephone Encounter (Signed)
Spoke with patient confirming March/April appointments.

## 2018-09-10 ENCOUNTER — Ambulatory Visit (HOSPITAL_COMMUNITY)
Admission: RE | Admit: 2018-09-10 | Discharge: 2018-09-10 | Disposition: A | Discharge status: Home / Self Care | Payer: Medicare Other | Source: Ambulatory Visit | Attending: Radiation Oncology | Admitting: Radiation Oncology

## 2018-09-10 ENCOUNTER — Other Ambulatory Visit: Payer: Self-pay

## 2018-09-10 DIAGNOSIS — C7931 Secondary malignant neoplasm of brain: Secondary | ICD-10-CM | POA: Diagnosis not present

## 2018-09-10 DIAGNOSIS — C7949 Secondary malignant neoplasm of other parts of nervous system: Secondary | ICD-10-CM | POA: Diagnosis present

## 2018-09-10 MED ORDER — GADOBUTROL 1 MMOL/ML IV SOLN
7.0000 mL | Freq: Once | INTRAVENOUS | Status: AC | PRN
  Administered 2018-09-10: 7 mL via INTRAVENOUS

## 2018-09-13 ENCOUNTER — Telehealth: Payer: Self-pay | Admitting: Radiation Oncology

## 2018-09-13 NOTE — Telephone Encounter (Signed)
I called the patient to review her MRI results. In conference her case was discussed yesterday and the 3 mm area of enhancement and mild edema was felt to be consistent with her prior treatment. We will follow up with her in 3 months with repeat MRI. She is in agreement with this plan.

## 2018-09-16 ENCOUNTER — Other Ambulatory Visit: Payer: Self-pay | Admitting: Oncology

## 2018-09-17 ENCOUNTER — Telehealth: Payer: Self-pay | Admitting: Oncology

## 2018-09-17 ENCOUNTER — Other Ambulatory Visit: Payer: Self-pay

## 2018-09-17 ENCOUNTER — Encounter: Payer: Self-pay | Admitting: Nurse Practitioner

## 2018-09-17 ENCOUNTER — Inpatient Hospital Stay: Payer: Medicare Other

## 2018-09-17 ENCOUNTER — Other Ambulatory Visit: Payer: Self-pay | Admitting: Radiation Therapy

## 2018-09-17 ENCOUNTER — Inpatient Hospital Stay (HOSPITAL_BASED_OUTPATIENT_CLINIC_OR_DEPARTMENT_OTHER): Payer: Medicare Other | Admitting: Nurse Practitioner

## 2018-09-17 VITALS — BP 127/89 | HR 79 | Temp 99.1°F | Resp 18 | Ht 64.0 in | Wt 154.0 lb

## 2018-09-17 DIAGNOSIS — C7931 Secondary malignant neoplasm of brain: Secondary | ICD-10-CM | POA: Diagnosis not present

## 2018-09-17 DIAGNOSIS — C20 Malignant neoplasm of rectum: Secondary | ICD-10-CM | POA: Diagnosis not present

## 2018-09-17 DIAGNOSIS — N390 Urinary tract infection, site not specified: Secondary | ICD-10-CM

## 2018-09-17 DIAGNOSIS — G620 Drug-induced polyneuropathy: Secondary | ICD-10-CM

## 2018-09-17 DIAGNOSIS — Z5111 Encounter for antineoplastic chemotherapy: Secondary | ICD-10-CM | POA: Diagnosis not present

## 2018-09-17 DIAGNOSIS — Z7901 Long term (current) use of anticoagulants: Secondary | ICD-10-CM

## 2018-09-17 DIAGNOSIS — I871 Compression of vein: Secondary | ICD-10-CM

## 2018-09-17 DIAGNOSIS — C7949 Secondary malignant neoplasm of other parts of nervous system: Principal | ICD-10-CM

## 2018-09-17 DIAGNOSIS — D6959 Other secondary thrombocytopenia: Secondary | ICD-10-CM

## 2018-09-17 DIAGNOSIS — C770 Secondary and unspecified malignant neoplasm of lymph nodes of head, face and neck: Secondary | ICD-10-CM | POA: Diagnosis not present

## 2018-09-17 DIAGNOSIS — Z95828 Presence of other vascular implants and grafts: Secondary | ICD-10-CM

## 2018-09-17 DIAGNOSIS — R319 Hematuria, unspecified: Secondary | ICD-10-CM

## 2018-09-17 LAB — CMP (CANCER CENTER ONLY)
ALT: 24 U/L (ref 0–44)
AST: 23 U/L (ref 15–41)
Albumin: 3.6 g/dL (ref 3.5–5.0)
Alkaline Phosphatase: 168 U/L — ABNORMAL HIGH (ref 38–126)
Anion gap: 8 (ref 5–15)
BUN: 18 mg/dL (ref 6–20)
CO2: 24 mmol/L (ref 22–32)
Calcium: 9.1 mg/dL (ref 8.9–10.3)
Chloride: 108 mmol/L (ref 98–111)
Creatinine: 1.1 mg/dL — ABNORMAL HIGH (ref 0.44–1.00)
GFR, Estimated: 56 mL/min — ABNORMAL LOW (ref >60)
Glucose, Bld: 97 mg/dL (ref 70–99)
Potassium: 4 mmol/L (ref 3.5–5.1)
Sodium: 140 mmol/L (ref 135–145)
Total Bilirubin: 0.3 mg/dL (ref 0.3–1.2)
Total Protein: 7.3 g/dL (ref 6.5–8.1)

## 2018-09-17 LAB — CBC WITH DIFFERENTIAL (CANCER CENTER ONLY)
Abs Immature Granulocytes: 3.43 10*3/uL — ABNORMAL HIGH (ref 0.00–0.07)
Basophils Absolute: 0.1 10*3/uL (ref 0.0–0.1)
Basophils Relative: 1 %
EOS PCT: 1 %
Eosinophils Absolute: 0.2 10*3/uL (ref 0.0–0.5)
HEMATOCRIT: 34 % — AB (ref 36.0–46.0)
Hemoglobin: 10.9 g/dL — ABNORMAL LOW (ref 12.0–15.0)
Immature Granulocytes: 19 %
Lymphocytes Relative: 5 %
Lymphs Abs: 0.9 10*3/uL (ref 0.7–4.0)
MCH: 32.1 pg (ref 26.0–34.0)
MCHC: 32.1 g/dL (ref 30.0–36.0)
MCV: 100 fL (ref 80.0–100.0)
Monocytes Absolute: 0.9 10*3/uL (ref 0.1–1.0)
Monocytes Relative: 5 %
Neutro Abs: 12.2 10*3/uL — ABNORMAL HIGH (ref 1.7–7.7)
Neutrophils Relative %: 69 %
Platelet Count: 76 10*3/uL — ABNORMAL LOW (ref 150–400)
RBC: 3.4 MIL/uL — ABNORMAL LOW (ref 3.87–5.11)
RDW: 17.2 % — ABNORMAL HIGH (ref 11.5–15.5)
WBC Count: 17.7 10*3/uL — ABNORMAL HIGH (ref 4.0–10.5)
nRBC: 0.3 % — ABNORMAL HIGH (ref 0.0–0.2)

## 2018-09-17 LAB — CEA (IN HOUSE-CHCC): CEA (CHCC-In House): 11.57 ng/mL — ABNORMAL HIGH (ref 0.00–5.00)

## 2018-09-17 MED ORDER — SODIUM CHLORIDE 0.9 % IV SOLN
2400.0000 mg/m2 | INTRAVENOUS | Status: DC
  Administered 2018-09-17: 4300 mg via INTRAVENOUS
  Filled 2018-09-17: qty 86

## 2018-09-17 MED ORDER — PALONOSETRON HCL INJECTION 0.25 MG/5ML
INTRAVENOUS | Status: AC
  Filled 2018-09-17: qty 5

## 2018-09-17 MED ORDER — DEXTROSE 5 % IV SOLN
Freq: Once | INTRAVENOUS | Status: AC
  Administered 2018-09-17: 11:00:00 via INTRAVENOUS
  Filled 2018-09-17: qty 250

## 2018-09-17 MED ORDER — PALONOSETRON HCL INJECTION 0.25 MG/5ML
0.2500 mg | Freq: Once | INTRAVENOUS | Status: AC
  Administered 2018-09-17: 0.25 mg via INTRAVENOUS

## 2018-09-17 MED ORDER — FLUOROURACIL CHEMO INJECTION 2.5 GM/50ML
400.0000 mg/m2 | Freq: Once | INTRAVENOUS | Status: AC
  Administered 2018-09-17: 700 mg via INTRAVENOUS
  Filled 2018-09-17: qty 14

## 2018-09-17 MED ORDER — LEUCOVORIN CALCIUM INJECTION 350 MG
400.0000 mg/m2 | Freq: Once | INTRAVENOUS | Status: AC
  Administered 2018-09-17: 720 mg via INTRAVENOUS
  Filled 2018-09-17: qty 36

## 2018-09-17 MED ORDER — OXALIPLATIN CHEMO INJECTION 100 MG/20ML
65.0000 mg/m2 | Freq: Once | INTRAVENOUS | Status: AC
  Administered 2018-09-17: 115 mg via INTRAVENOUS
  Filled 2018-09-17: qty 10

## 2018-09-17 MED ORDER — SODIUM CHLORIDE 0.9 % IV SOLN
Freq: Once | INTRAVENOUS | Status: AC
  Administered 2018-09-17: 12:00:00 via INTRAVENOUS
  Filled 2018-09-17: qty 5

## 2018-09-17 MED ORDER — SODIUM CHLORIDE 0.9% FLUSH
10.0000 mL | INTRAVENOUS | Status: DC | PRN
  Administered 2018-09-17: 10 mL
  Filled 2018-09-17: qty 10

## 2018-09-17 NOTE — Telephone Encounter (Signed)
Added appointment for patient on 4/28 and sent letter with new schedule.

## 2018-09-17 NOTE — Patient Instructions (Signed)
Coronavirus (COVID-19) Are you at risk?  Are you at risk for the Coronavirus (COVID-19)?  To be considered HIGH RISK for Coronavirus (COVID-19), you have to meet the following criteria:  . Traveled to [place], [place], [place], [place] or [place]; or in the [place] to [place], [place], [place], or [place]; and have fever, cough, and shortness of breath within the last 2 weeks of travel OR . Been in close contact with a person diagnosed with COVID-19 within the last 2 weeks and have fever, cough, and shortness of breath . IF YOU DO NOT MEET THESE CRITERIA, YOU ARE CONSIDERED LOW RISK FOR COVID-19.  What to do if you are HIGH RISK for COVID-19?  . If you are having a medical emergency, call 911. . Seek medical care right away. Before you go to a doctor's office, urgent care or emergency department, call ahead and tell them about your recent travel, contact with someone diagnosed with COVID-19, and your symptoms. You should receive instructions from your physician's office regarding next steps of care.  . When you arrive at healthcare provider, tell the healthcare staff immediately you have returned from visiting [place], [place], [place], [place] or [place]; or traveled in the [place] to [place], [place], [place], or [place]; in the last two weeks or you have been in close contact with a person diagnosed with COVID-19 in the last 2 weeks.   . Tell the health care staff about your symptoms: fever, cough and shortness of breath. . After you have been seen by a medical provider, you will be either: o Tested for (COVID-19) and discharged home on quarantine except to seek medical care if symptoms worsen, and asked to  - Stay home and avoid contact with others until you get your results (4-5 days)  - Avoid travel on public transportation if possible (such as bus, train, or airplane) or o Sent to the Emergency Department by EMS for evaluation, COVID-19 testing, and possible  admission depending on your condition and test results.  What to do if you are LOW RISK for COVID-19?  Reduce your risk of any infection by using the same precautions used for avoiding the common cold or flu:  . Wash your hands often with soap and warm water for at least 20 seconds.  If soap and water are not readily available, use an alcohol-based hand sanitizer with at least 60% alcohol.  . If coughing or sneezing, cover your mouth and nose by coughing or sneezing into the elbow areas of your shirt or coat, into a tissue or into your sleeve (not your hands). . Avoid shaking hands with others and consider head nods or verbal greetings only. . Avoid touching your eyes, nose, or mouth with unwashed hands.  . Avoid close contact with people who are sick. . Avoid places or events with large numbers of people in one location, like concerts or sporting events. . Carefully consider travel plans you have or are making. . If you are planning any travel outside or inside the [place], visit the CDC's Travelers' Health webpage for the latest health notices. . If you have some symptoms but not all symptoms, continue to monitor at home and seek medical attention if your symptoms worsen. . If you are having a medical emergency, call 911.  ADDITIONAL HEALTHCARE OPTIONS FOR PATIENTS  [facility] / e-Visit: [email or web address]         [facility]: [phone]  [facility]: [phone]                   [facility]: [phone]   [facility] Discharge Instructions for Patients Receiving Chemotherapy  Today you received the following chemotherapy agents: Oxaliplatin (Eloxatin), Leucovorin, Fluorouracil (Adrucil, 5-FU)  To help prevent nausea and vomiting after your treatment, we encourage you to take your nausea medication as directed.    If you develop nausea and vomiting that is not controlled by  your nausea medication, call the clinic.   BELOW ARE SYMPTOMS THAT SHOULD BE REPORTED IMMEDIATELY:  *FEVER GREATER THAN 100.5 F  *CHILLS WITH OR WITHOUT FEVER  NAUSEA AND VOMITING THAT IS NOT CONTROLLED WITH YOUR NAUSEA MEDICATION  *UNUSUAL SHORTNESS OF BREATH  *UNUSUAL BRUISING OR BLEEDING  TENDERNESS IN MOUTH AND THROAT WITH OR WITHOUT PRESENCE OF ULCERS  *URINARY PROBLEMS  *BOWEL PROBLEMS  UNUSUAL RASH Items with * indicate a potential emergency and should be followed up as soon as possible.  Feel free to call the clinic should you have any questions or concerns. The clinic phone number is [phone].  Please show the CHEMO ALERT CARD at check-in to the Emergency Department and triage nurse.   

## 2018-09-17 NOTE — Progress Notes (Signed)
[name] OFFICE PROGRESS NOTE   Diagnosis: Rectal cancer  INTERVAL HISTORY:   [name] returns as scheduled.  She completed cycle 8 FOLFOX 09/04/2018.  She in general does not feel well for several days after each treatment.  She has intermittent nausea, no vomiting.  She notes her mouth is sore but she does not develop ulcers.  She has increased neuropathy symptoms involving the mouth.  Numbness in the hands and feet reported as stable.  Appetite varies.  She had an episode of left-sided abdominal pain last night.  No pain today.  She denies fever, cough, shortness of breath.  Objective:  Vital signs in last 24 hours:  Blood pressure 127/89, pulse 79, temperature 99.1 F (37.3 C), temperature source Oral, resp. rate 18, height 5' 4"  (1.626 m), weight 154 lb (69.9 kg), SpO2 96 %.    HEENT: Mild white coating over tongue.  No buccal thrush.  No ulcers. Lymphatics: 2 to 3 cm mass left lower neck. Resp: Respirations even and unlabored. Vascular: Right lower leg is slightly larger than the left lower leg. Neuro: Alert and oriented. Port-A-Cath without erythema.  Lab Results:  Lab Results  Component Value Date   WBC 17.7 (H) 09/17/2018   HGB 10.9 (L) 09/17/2018   HCT 34.0 (L) 09/17/2018   MCV 100.0 09/17/2018   PLT 76 (L) 09/17/2018   NEUTROABS PENDING 09/17/2018    Imaging:  No results found.  Medications: I have reviewed the patient's current medications.  Assessment/Plan: 1. Rectal cancer, clinical stage III (T3 N1) status post biopsy of a rectal mass on 05/24/2013 confirming adenocarcinoma. Endoscopic ultrasound 05/31/2013 measured the mass at 11 cm from the anal verge, uT3uN1.  Initiation of radiation and concurrent Xeloda 06/18/2013; completion 07/26/2013.   Status post low anterior resection 09/02/2013. Invasive adenocarcinoma, 3.7 cm, negative margins; metastatic carcinoma in 7 of 11 lymph nodes; extensive lymph vascular involvement by  tumor.MSI-stable, no loss of mismatch repair protein expression  Cycle 1 adjuvant FOLFOX 10/15/2013.   Cycle 2 adjuvant FOLFOX 10/29/2013.   Cycle 3 adjuvant FOLFOX 11/14/2013.   Cycle 4 adjuvant FOLFOX 11/27/2013.   Cycle 5 adjuvant FOLFOX 12/10/2013   Cycle 6 adjuvant FOLFOX 12/24/2013.   Cycle 7 adjuvant FOLFOX 01/14/2014.   Cycle 8 adjuvant FOLFOX 01/28/2014   Cycle 9 adjuvant FOLFOX 02/11/2014 (oxaliplatin dose reduced secondary to thrombocytopenia)   Surveillance CT scans 06/02/2014 with no evidence of recurrent rectal cancer  surveillance CT scans 06/16/2015 with no evidence of recurrent rectal cancer, 10 mm right external iliac node , increased amount of loculated air associated with the presacral soft tissue thickening , new left hydronephrosis  Surveillance CT scans 05/06/2016-negative for recurrent rectal cancer, decreased right external iliac node, persistent left hydronephrosis  CT abdomen/pelvis 07/05/2017-new left para-aortic, aortocaval, right common iliac and right external iliac lymphadenopathy.  CT neck/chest 07/13/2017-enlarged left supraclavicular nodes, new paratracheal nodes  Ultrasound-guided biopsy of a left supraclavicular lymph node 07/25/2017-metastatic adenocarcinoma consistent with colorectal cancer,MSI-stable, tumor mutation burden-10, K-ras S24mtation  Cycle 1 FOLFIRI 08/28/2017  Cycle 2 FOLFIRI 09/11/2017  Cycle 3 FOLFIRI 09/25/2017  Cycle 4 FOLFIRI 10/09/2017  Cycle 5 FOLFIRI 10/23/2017  CTs 11/14/2017-slight decrease in left supraclavicular adenopathy, slight increase and decrease in mediastinal/retroperitoneal/pelvic nodes, no new disease  Cycle 6 FOLFIRI 11/14/2017  Cycle 7 FOLFIRI 11/27/2017  Cycle 8 FOLFIRI 12/11/2017  Cycle 9 FOLFIRI 01/03/2018  Cycle 10 FOLFIRI 01/18/2018  Restaging CTs 01/29/2018-stable thoracic adenopathywith mildly improved abdominal adenopathy. Similar appearance of presacral soft tissue  density.  Suspected small fistula from the posterior margin of the rectum or anastomosis to the presacral region.  Cycle 11 FOLFIRI 01/31/2018  Cycle 12 FOLFIRI 02/20/2018  CT pelvis 03/16/2018-mild progression of pelvic lymphadenopathy with compression of the right iliac vein by an enlarged right obturator node  Cycle 1 FOLFOX 04/23/2018  CT emergency department 04/24/2018-slight interval increase in retroperitoneal adenopathy and right pelvic sidewall mass.  Cycle 2 FOLFOX 05/10/2018  Brain CT 05/10/2018-left parietal metastasis with extensive vasogenic edema  Brain MRI 05/22/2018-3 cm solitary left parietal metastasis with extensive vasogenic edema  SRS left parietal metastasis12/04/2018, brain mass resection12/05/2018-metastatic adenocarcinoma consistent with primary rectal adenocarcinoma  Cycle 3 FOLFOX 06/07/2018  Cycle 4 FOLFOX 06/28/2018, G-CSF support added  Cycle 5 FOLFOX 07/23/2018  Restaging CTs 08/02/2018- majority of nodal metastasis including within the abdomen/pelvis decreased in size since 04/24/2018. There has been enlargement of a left supraclavicular node since most recent chest CT 01/29/2018. No new sites of disease identified.  Cycle 6 FOLFOX 08/07/2018  Cycle 7 FOLFOX 08/20/2018 (oxaliplatin dose reduced secondary to thrombocytopenia)  Cycle 8 FOLFOX 09/04/2018  Brain MRI 323 2020-14 mm diameter resection cavity left posterior parietal lobe showed continued involution and marked improvement.  3 mm rim of enhancement of the most dependent/new/deep aspect of the resection cavity associated with some residual vasogenic edema.  Cycle 9 FOLFOX 09/17/2018 2. Post colonoscopy bowel perforation confirmed on abdomen CT 05/28/2013. 3. Single indeterminate liver lesion on the abdomen CT 05/28/2013. MRI on 06/18/2013 confirmed 2 tiny liver lesions, indeterminate-favored to be benign cysts. 4. History of cervical and lumbar disc disease.  5. History of HELLP syndrome with first  pregnancy.  6. Delayed nausea following cycle 1 FOLFOX. She receives Aloxi and Emend. She continues to experience delayed nausea. Prophylactic Decadron added with cycle 4. 7. History of pain with bowel movements. 8. Anastomotic leak on CT 09/13/2013. Status post exploratory laparotomy with drainage of a pelvic abscess, diverting loop ileostomy. Drainage catheter removed 10/23/2013.  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 Perineal numbness, urinary retention. The urinary retention has improved. 10. Perineal pain-improved. 11. Neutropenia secondary chemotherapy-she received Neulasta. 12. Urinary tract infection, Escherichia coli-11/03/2013. 13. Venous engorgement at the left anterior chest and left arm with swelling of the left arm and hand. Venous Doppler 11/14/2013 positive for DVT. She began Lovenox 11/14/2013 and was switched to xarelto 12/10/2013. Xarelto discontinued early January 2016. 14. Fever 11/20/2013. Blood and urine cultures negative. The fever resolved within 24 hours. She continued to have a single episode of fever following chemotherapy 15. History of a herpetic lip lesion 16. 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 17. Escherichia coli bacteremia, urinary source, 01/14/2014. She completed a 10 day course of ceftriaxone. 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 Anxiety/depression. 20. Port-A-Cath removal 06/09/2014. 21. Escherichia coli urinary tract infection 06/24/2014. She completed a course of ciprofloxacin. 22. frequent bowel movements following surgery/radiation -status post placement of a sacral stimulator 11/21/2014 23. CT 06/16/2015 with new left hydronephrosis status post cystoscopy with placement of a double-J stent 07/17/2015. No tumors or stones were seen in the bladder. Both ureteral orifices were unremarkable. On the left side retrograde study was  performed. The distal ureter was narrowed and then a little bit more dilated proximally with hydronephrosis noted. There was no evidence of intramural tumor but the area did seem a bit strictured.  Stent removed, persistent hydronephrosis, followed by Dr. 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  She has completed 8 cycles of FOLFOX.  There is no clinical evidence of disease progression.  Most recent CEA was stable.  Plan to proceed with cycle 9 today as scheduled.  The plan is for restaging CTs after cycle 10.  We reviewed the recent MRI brain results with her and also reviewed the images.  She will return for lab, follow-up and cycle 10 FOLFOX in 2 weeks.  She will contact the office in the interim with any problems.  Patient seen with Dr. Benay Spice.      Ned Card ANP/GNP-BC   09/17/2018  10:38 AM This was a shared visit with Ned Card.  [name] appears stable.  She will complete another cycle of FOLFOX today.  We reviewed the brain MRI images with her.  [name], MD

## 2018-09-17 NOTE — Progress Notes (Signed)
Per Marlynn Perking: OK to treat with platelets of 76

## 2018-09-18 ENCOUNTER — Telehealth: Payer: Self-pay | Admitting: *Deleted

## 2018-09-18 NOTE — Telephone Encounter (Signed)
Notified by managed care that patient's current [facility] plan has [facility] as out of network. They have already extended her care till 09/20/2018. Nothing else can be done from our office. Patient needs to call insurance company to determine what provider/Cancer center is on her preferred provider list.  Called patient and left VM with this information and also suggested she ask to speak with a health professional there to plea her case in that she has been coming here for 2 year for cancer care to determine if pressure from the patient will make a difference.

## 2018-09-19 ENCOUNTER — Other Ambulatory Visit: Payer: Self-pay

## 2018-09-19 ENCOUNTER — Inpatient Hospital Stay: Payer: Medicare Other | Attending: Nurse Practitioner

## 2018-09-19 VITALS — BP 105/79 | HR 72 | Temp 98.9°F | Resp 18

## 2018-09-19 DIAGNOSIS — C20 Malignant neoplasm of rectum: Secondary | ICD-10-CM | POA: Insufficient documentation

## 2018-09-19 DIAGNOSIS — Z5189 Encounter for other specified aftercare: Secondary | ICD-10-CM | POA: Insufficient documentation

## 2018-09-19 MED ORDER — HEPARIN SOD (PORK) LOCK FLUSH 100 UNIT/ML IV SOLN
500.0000 [IU] | Freq: Once | INTRAVENOUS | Status: AC | PRN
  Administered 2018-09-19: 500 [IU]
  Filled 2018-09-19: qty 5

## 2018-09-19 MED ORDER — PEGFILGRASTIM-CBQV 6 MG/0.6ML ~~LOC~~ SOSY
PREFILLED_SYRINGE | SUBCUTANEOUS | Status: AC
  Filled 2018-09-19: qty 0.6

## 2018-09-19 MED ORDER — PEGFILGRASTIM-CBQV 6 MG/0.6ML ~~LOC~~ SOSY
6.0000 mg | PREFILLED_SYRINGE | Freq: Once | SUBCUTANEOUS | Status: AC
  Administered 2018-09-19: 16:00:00 6 mg via SUBCUTANEOUS

## 2018-09-19 MED ORDER — SODIUM CHLORIDE 0.9% FLUSH
10.0000 mL | INTRAVENOUS | Status: DC | PRN
  Administered 2018-09-19: 16:00:00 10 mL
  Filled 2018-09-19: qty 10

## 2018-09-19 MED ORDER — SODIUM CHLORIDE 0.9 % IV SOLN
INTRAVENOUS | Status: AC
  Administered 2018-09-19: 14:00:00 via INTRAVENOUS
  Filled 2018-09-19 (×2): qty 250

## 2018-09-19 MED ORDER — DEXAMETHASONE SODIUM PHOSPHATE 10 MG/ML IJ SOLN
INTRAMUSCULAR | Status: AC
  Filled 2018-09-19: qty 1

## 2018-09-19 MED ORDER — DEXAMETHASONE SODIUM PHOSPHATE 10 MG/ML IJ SOLN
10.0000 mg | Freq: Once | INTRAMUSCULAR | Status: AC
  Administered 2018-09-19: 10 mg via INTRAVENOUS

## 2018-09-25 ENCOUNTER — Telehealth: Payer: Self-pay | Admitting: *Deleted

## 2018-09-25 DIAGNOSIS — C20 Malignant neoplasm of rectum: Secondary | ICD-10-CM

## 2018-09-25 NOTE — Telephone Encounter (Addendum)
Left VM that she is looking into going to [facility] for oncology care due to insurance company considers Dr. Benay Spice and Larence Penning out of network. Asking for his opinion on the practice. Also reported a throbbing pain in her right chest. Return call at 10:15--no answer-left VM to call back. @11 :05--Return call from patient. Has entered request for an appointment on line at [facility] and has not heard from them yet. Asking for Dr. Benay Spice to refer her to an oncologist there to expedite the process. Also reports a sudden onset of throbbing in her right upper chest last night rated "5-6" that took #3 doses of Norco to resolve. Not throbbing this morning. Does report her upper chest near axilla is sore to touch. Reports no pain at port (right chest) or redness or tenderness. No swelling in her arm or chest/neck area. Questioning what may have caused this?

## 2018-09-25 NOTE — Telephone Encounter (Signed)
Called patient back and informed her that referral was ordered as "urgent" for her to see Dr. Felizardo Hoffmann. She reports they called her back and she has an appointment tomorrow at 4:15 pm. They are requesting records be faxed to them at [phone]. Forwarded this request in high priority in-basket message to HIM department. Appreciates the care she received and when she signs up next year, she will request a plan that has Dr. Benay Spice in network and will return. Per Dr. Benay Spice: Just monitor the chest throbbing for now and look for any swelling in neck, arm or around port. She understands and agrees and states it is no longer hurting.

## 2018-09-27 ENCOUNTER — Other Ambulatory Visit: Payer: Self-pay | Admitting: Nurse Practitioner

## 2018-09-27 DIAGNOSIS — C20 Malignant neoplasm of rectum: Secondary | ICD-10-CM

## 2018-10-02 ENCOUNTER — Ambulatory Visit: Payer: Commercial Indemnity | Admitting: Oncology

## 2018-10-02 ENCOUNTER — Ambulatory Visit: Payer: Commercial Indemnity

## 2018-10-02 ENCOUNTER — Other Ambulatory Visit: Payer: Commercial Indemnity

## 2018-10-04 ENCOUNTER — Ambulatory Visit: Payer: Commercial Indemnity

## 2018-10-16 ENCOUNTER — Other Ambulatory Visit: Payer: Commercial Indemnity

## 2018-10-16 ENCOUNTER — Ambulatory Visit: Payer: Commercial Indemnity | Admitting: Oncology

## 2018-10-16 ENCOUNTER — Ambulatory Visit: Payer: Commercial Indemnity

## 2018-10-23 ENCOUNTER — Telehealth: Payer: Self-pay | Admitting: Radiation Therapy

## 2018-10-23 NOTE — Telephone Encounter (Signed)
Called to inform pt of her upcoming Brain MRI and follow-up visit with Bryson Ha in June. She informed me that she has temporarily switched her cancer care to [facility] due to an insurance change. She is unable to have procedures here at [facility] until changing back in October during open enrollment.    She will ask her current Medical Oncologist to order a brain MRI to be done in June at [facility] instead. She was very thankful for the call and sad that she is now out of our network.   Mont Dutton R.T.(R)(T) Special Procedures Navigator

## 2018-12-10 ENCOUNTER — Telehealth: Payer: Self-pay | Admitting: *Deleted

## 2018-12-10 ENCOUNTER — Other Ambulatory Visit: Payer: Self-pay | Admitting: Radiation Therapy

## 2018-12-10 NOTE — Telephone Encounter (Signed)
On 12-10-18 fax office note to [facility]. It was end of tx notes

## 2018-12-14 ENCOUNTER — Ambulatory Visit (HOSPITAL_COMMUNITY): Payer: Commercial Indemnity

## 2018-12-17 ENCOUNTER — Ambulatory Visit: Payer: Self-pay | Admitting: Radiation Oncology

## 2018-12-20 ENCOUNTER — Ambulatory Visit
Admission: RE | Admit: 2018-12-20 | Discharge: 2018-12-20 | Disposition: A | Discharge status: Home / Self Care | Payer: Self-pay | Source: Ambulatory Visit | Attending: Radiation Oncology | Admitting: Radiation Oncology

## 2018-12-20 ENCOUNTER — Other Ambulatory Visit: Payer: Self-pay | Admitting: Radiation Oncology

## 2018-12-20 DIAGNOSIS — C7931 Secondary malignant neoplasm of brain: Secondary | ICD-10-CM

## 2018-12-24 ENCOUNTER — Telehealth: Payer: Self-pay | Admitting: Radiation Oncology

## 2018-12-24 NOTE — Telephone Encounter (Signed)
LM for pt about outside MRI results and recommendations to repeat in 3 months. I asked her to reach out to [place] so we know when she will have these scans done and we can review in conference.

## 2019-03-11 IMAGING — MR MR HEAD WO/W CM
9 of 11 series · 26 of 48 positions shown · IV contrast (gadavist)
Comparison: 05/21/2018

CLINICAL DATA: Rectal cancer with brain metastasis

EXAM:
MRI HEAD WITHOUT AND WITH CONTRAST
TECHNIQUE: Multiplanar, multiecho pulse sequences of the brain and surrounding
structures were obtained without and with intravenous contrast.
CONTRAST:  7.5 cc Gadavist intravenous

[Series 2: FLAIR · sagittal · 3.0mm · 0.47mm/px · 3 of 39 slices shown (1 of 3)]
[im 1/39]
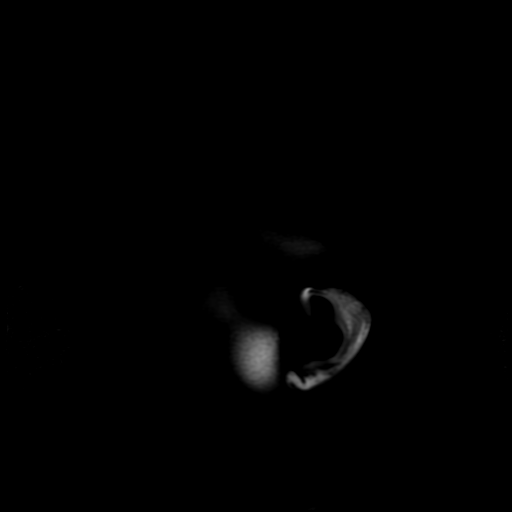
[im 20/39]
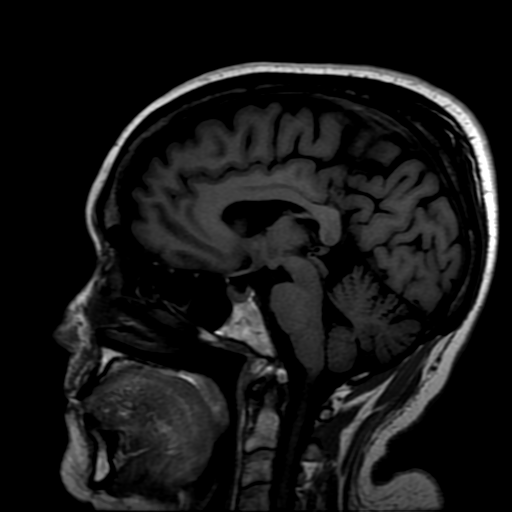
[im 39/39]
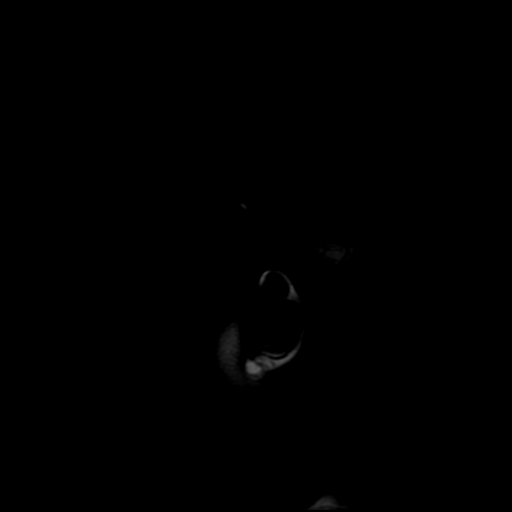

[Series 4: DWI · axial · 3.0mm · 0.94mm/px · z∈[-47,+106]mm · 6 of 104 slices shown]
[im 1/104]
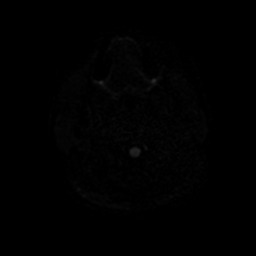
[im 21/104]
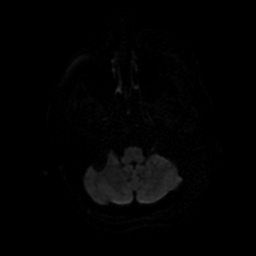
[im 42/104]
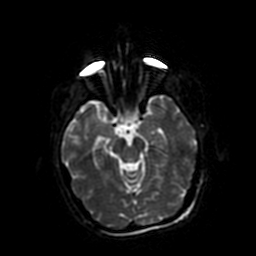
[im 62/104]
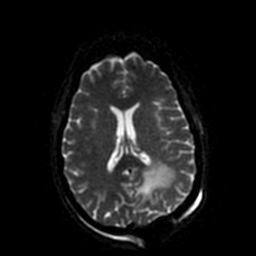
[im 83/104]
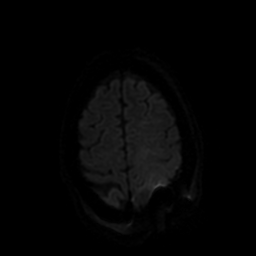
[im 104/104]
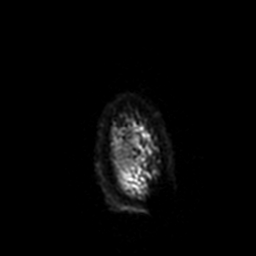

[Series 5: T2 · axial · 5.0mm · 0.47mm/px · z∈[-52,+104]mm · 2 of 27 slices shown]
[im 1/27]
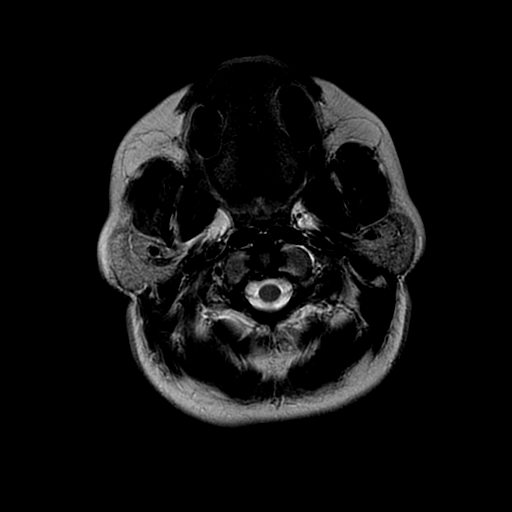
[im 27/27]
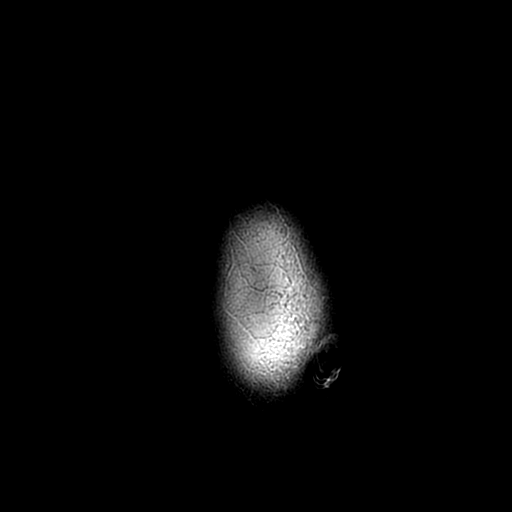

[Series 6: FLAIR · axial · 3.0mm · 0.45mm/px · z∈[-53,+118]mm · 3 of 58 slices shown (2 of 3)]
[im 1/58]
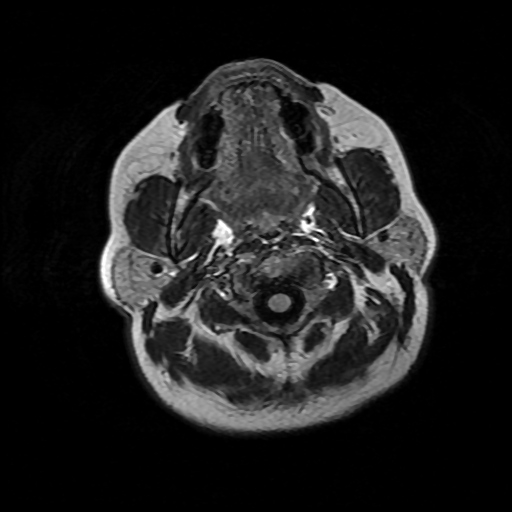
[im 29/58]
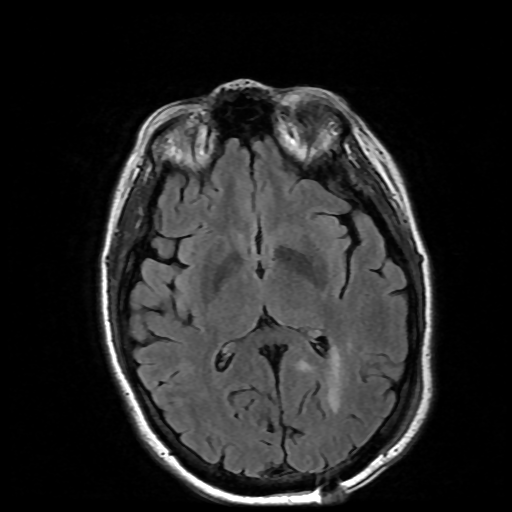
[im 58/58]
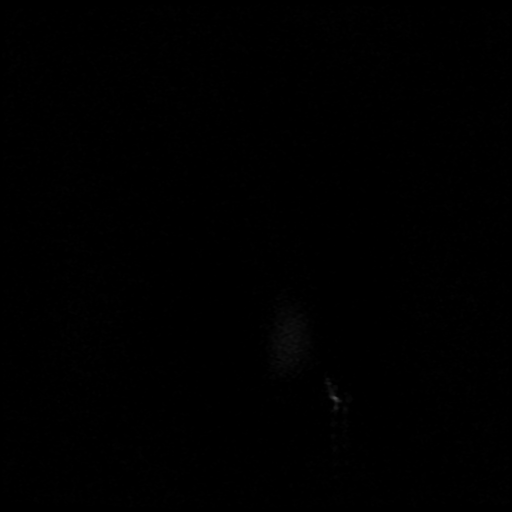

[Series 7: GRE · axial · 5.0mm · 0.47mm/px · z∈[-51,+105]mm · 2 of 27 slices shown]
[im 1/27]
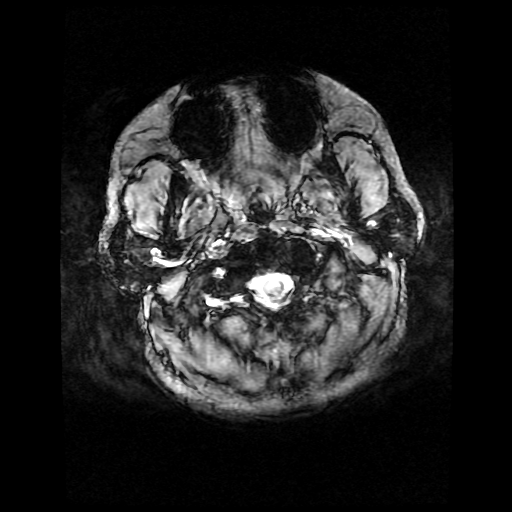
[im 27/27]
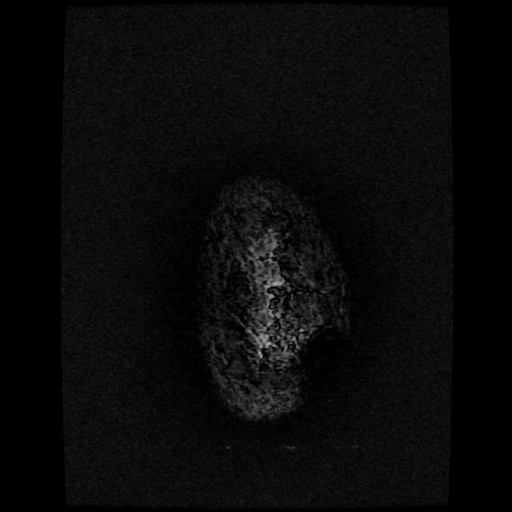

[Series 9: T2 post-contrast · coronal · 3.0mm · 0.39mm/px · 4 of 69 slices shown]
[im 1/69]
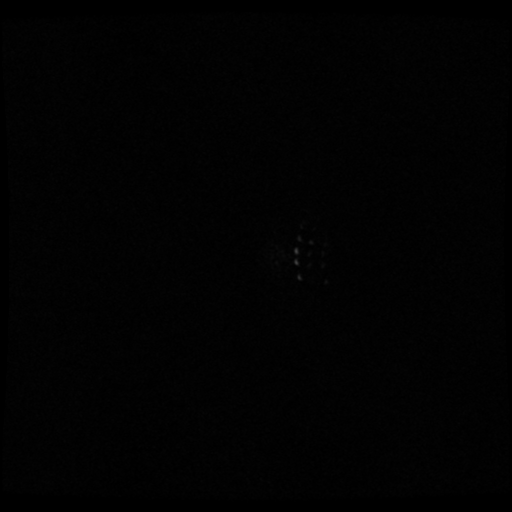
[im 23/69]
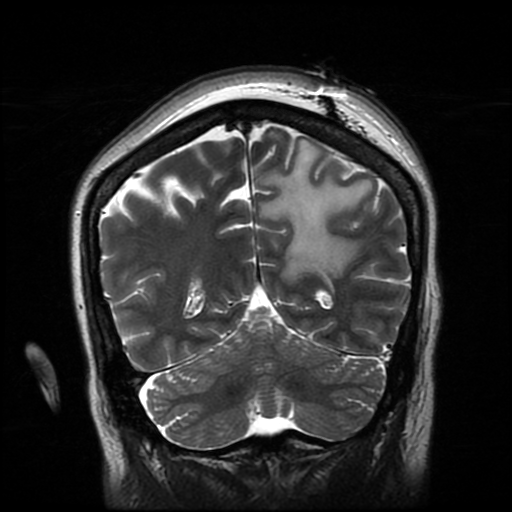
[im 46/69]
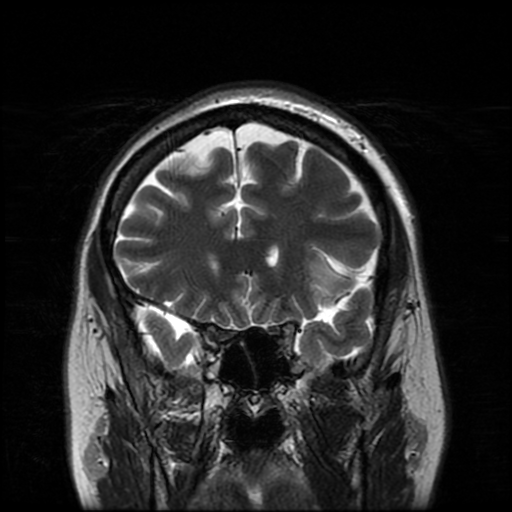
[im 69/69]
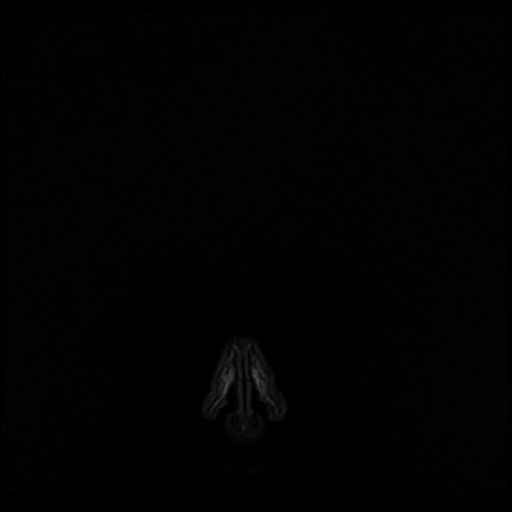

[Series 11: FLAIR · sagittal · 3.0mm · 0.47mm/px · 2 of 39 slices shown (3 of 3)]
[im 1/39]
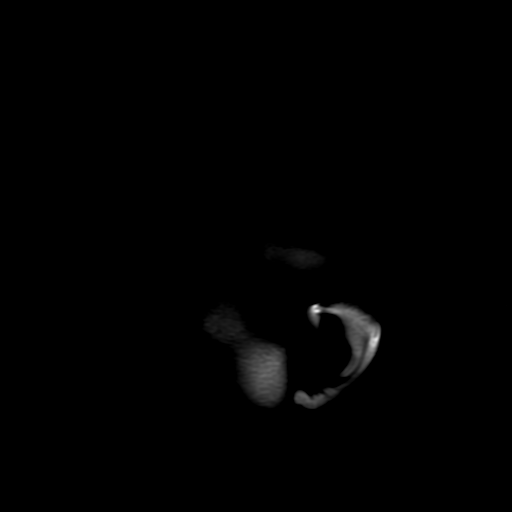
[im 39/39]
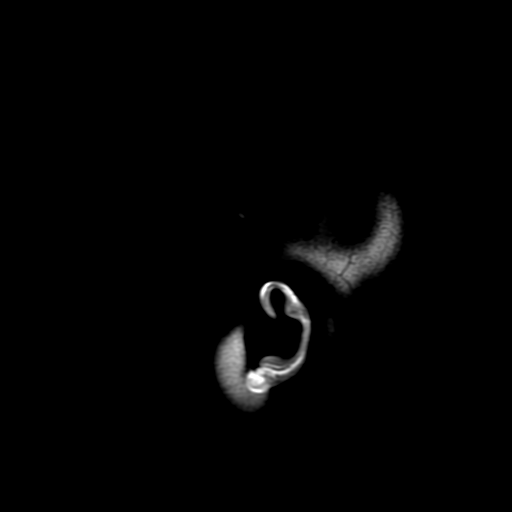

[Series 12: T1 · coronal · 3.0mm · 0.43mm/px · 1 of 59 slices shown]
[im 1/59]
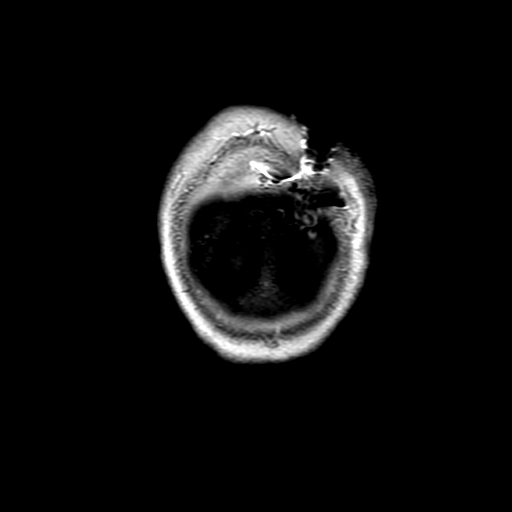

[Series 450: ADC · axial · 3.0mm · 0.94mm/px · z∈[-47,+106]mm · 3 of 51 slices shown]
[im 1/51]
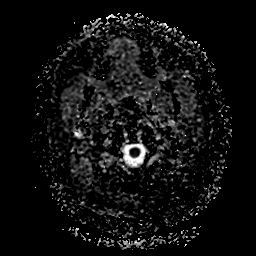
[im 26/51]
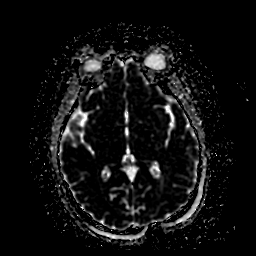
[im 51/51]
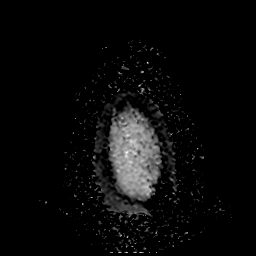

[26 of 48 positions shown; findings below may reference images not displayed]

FINDINGS: Brain: Changes of left parietal mass resection. Accounting for T1
hyperintense blood products there is no concerning enhancement.
Decreased but still extensive vasogenic edema in the posterior left
cerebrum. No evidence of second metastasis. No acute infarct,
hemorrhage, hydrocephalus, or collection.

Vascular: Major flow voids are preserved

Skull and upper cervical spine: Unremarkable craniotomy. Expected
scalp swelling.

Sinuses/Orbits: Negative
IMPRESSION: 1. Left parietal mass resection with no visible enhancing residual.
No complicating features.
2. Vasogenic edema remains extensive in the posterior left cerebrum.

## 2019-08-21 ENCOUNTER — Telehealth: Payer: Self-pay | Admitting: *Deleted

## 2019-08-21 NOTE — Telephone Encounter (Signed)
Called to ask how to sign up for COVID vaccine through [facility]. Provided her the website to go on wait list. She reports that her chemo treatments have stopped--MD said there is nothing else that can be done for her, cancer is not responding to anything. She says she now has a nephrostomy tube due to ureter blockage. Allowed her to vent her sadness and encouraged her to try to enjoy each day to the fullest.

## 2020-03-20 DEATH — deceased
